# Patient Record
Sex: Female | Born: 1940 | Race: White | Hispanic: No | Marital: Married | Smoking: Never smoker
Health system: Southern US, Community
[De-identification: ages and names within clinical notes are randomized; demographics above are authoritative.]

## PROBLEM LIST (undated history)

## (undated) DIAGNOSIS — M545 Low back pain, unspecified: Secondary | ICD-10-CM

## (undated) DIAGNOSIS — L309 Dermatitis, unspecified: Secondary | ICD-10-CM

## (undated) DIAGNOSIS — Z808 Family history of malignant neoplasm of other organs or systems: Secondary | ICD-10-CM

## (undated) DIAGNOSIS — N200 Calculus of kidney: Secondary | ICD-10-CM

## (undated) DIAGNOSIS — Z8051 Family history of malignant neoplasm of kidney: Secondary | ICD-10-CM

## (undated) DIAGNOSIS — I10 Essential (primary) hypertension: Secondary | ICD-10-CM

## (undated) DIAGNOSIS — M353 Polymyalgia rheumatica: Secondary | ICD-10-CM

## (undated) DIAGNOSIS — T7840XA Allergy, unspecified, initial encounter: Secondary | ICD-10-CM

## (undated) DIAGNOSIS — I700 Atherosclerosis of aorta: Secondary | ICD-10-CM

## (undated) DIAGNOSIS — Z8719 Personal history of other diseases of the digestive system: Secondary | ICD-10-CM

## (undated) DIAGNOSIS — M1990 Unspecified osteoarthritis, unspecified site: Secondary | ICD-10-CM

## (undated) DIAGNOSIS — K146 Glossodynia: Secondary | ICD-10-CM

## (undated) DIAGNOSIS — K2270 Barrett's esophagus without dysplasia: Secondary | ICD-10-CM

## (undated) DIAGNOSIS — H8109 Meniere's disease, unspecified ear: Secondary | ICD-10-CM

## (undated) DIAGNOSIS — I729 Aneurysm of unspecified site: Secondary | ICD-10-CM

## (undated) DIAGNOSIS — M8580 Other specified disorders of bone density and structure, unspecified site: Secondary | ICD-10-CM

## (undated) DIAGNOSIS — Z807 Family history of other malignant neoplasms of lymphoid, hematopoietic and related tissues: Secondary | ICD-10-CM

## (undated) DIAGNOSIS — D126 Benign neoplasm of colon, unspecified: Secondary | ICD-10-CM

## (undated) DIAGNOSIS — I639 Cerebral infarction, unspecified: Secondary | ICD-10-CM

## (undated) DIAGNOSIS — R51 Headache: Secondary | ICD-10-CM

## (undated) DIAGNOSIS — H269 Unspecified cataract: Secondary | ICD-10-CM

## (undated) DIAGNOSIS — F419 Anxiety disorder, unspecified: Secondary | ICD-10-CM

## (undated) DIAGNOSIS — G709 Myoneural disorder, unspecified: Secondary | ICD-10-CM

## (undated) DIAGNOSIS — C50919 Malignant neoplasm of unspecified site of unspecified female breast: Secondary | ICD-10-CM

## (undated) DIAGNOSIS — G902 Horner's syndrome: Secondary | ICD-10-CM

## (undated) DIAGNOSIS — Z803 Family history of malignant neoplasm of breast: Secondary | ICD-10-CM

## (undated) DIAGNOSIS — K219 Gastro-esophageal reflux disease without esophagitis: Secondary | ICD-10-CM

## (undated) DIAGNOSIS — Z87442 Personal history of urinary calculi: Secondary | ICD-10-CM

## (undated) DIAGNOSIS — I776 Arteritis, unspecified: Secondary | ICD-10-CM

## (undated) DIAGNOSIS — I8310 Varicose veins of unspecified lower extremity with inflammation: Secondary | ICD-10-CM

## (undated) DIAGNOSIS — D649 Anemia, unspecified: Secondary | ICD-10-CM

## (undated) DIAGNOSIS — D689 Coagulation defect, unspecified: Secondary | ICD-10-CM

## (undated) DIAGNOSIS — E785 Hyperlipidemia, unspecified: Secondary | ICD-10-CM

## (undated) HISTORY — PX: UPPER GASTROINTESTINAL ENDOSCOPY: SHX188

## (undated) HISTORY — DX: Arteritis, unspecified: I77.6

## (undated) HISTORY — DX: Malignant neoplasm of unspecified site of unspecified female breast: C50.919

## (undated) HISTORY — DX: Anemia, unspecified: D64.9

## (undated) HISTORY — DX: Family history of malignant neoplasm of other organs or systems: Z80.8

## (undated) HISTORY — DX: Family history of other malignant neoplasms of lymphoid, hematopoietic and related tissues: Z80.7

## (undated) HISTORY — DX: Glossodynia: K14.6

## (undated) HISTORY — DX: Other specified disorders of bone density and structure, unspecified site: M85.80

## (undated) HISTORY — DX: Calculus of kidney: N20.0

## (undated) HISTORY — DX: Family history of malignant neoplasm of breast: Z80.3

## (undated) HISTORY — DX: Personal history of other diseases of the digestive system: Z87.19

## (undated) HISTORY — PX: POPLITEAL SYNOVIAL CYST EXCISION: SUR555

## (undated) HISTORY — DX: Atherosclerosis of aorta: I70.0

## (undated) HISTORY — DX: Allergy, unspecified, initial encounter: T78.40XA

## (undated) HISTORY — DX: Horner's syndrome: G90.2

## (undated) HISTORY — DX: Myoneural disorder, unspecified: G70.9

## (undated) HISTORY — DX: Dermatitis, unspecified: L30.9

## (undated) HISTORY — DX: Polymyalgia rheumatica: M35.3

## (undated) HISTORY — DX: Gastro-esophageal reflux disease without esophagitis: K21.9

## (undated) HISTORY — DX: Varicose veins of unspecified lower extremity with inflammation: I83.10

## (undated) HISTORY — DX: Barrett's esophagus without dysplasia: K22.70

## (undated) HISTORY — DX: Unspecified cataract: H26.9

## (undated) HISTORY — DX: Aneurysm of unspecified site: I72.9

## (undated) HISTORY — DX: Low back pain: M54.5

## (undated) HISTORY — DX: Unspecified osteoarthritis, unspecified site: M19.90

## (undated) HISTORY — DX: Benign neoplasm of colon, unspecified: D12.6

## (undated) HISTORY — PX: POLYPECTOMY: SHX149

## (undated) HISTORY — DX: Low back pain, unspecified: M54.50

## (undated) HISTORY — DX: Headache: R51

## (undated) HISTORY — DX: Family history of malignant neoplasm of kidney: Z80.51

## (undated) HISTORY — DX: Meniere's disease, unspecified ear: H81.09

## (undated) HISTORY — DX: Hyperlipidemia, unspecified: E78.5

## (undated) HISTORY — DX: Coagulation defect, unspecified: D68.9

## (undated) HISTORY — PX: EYE SURGERY: SHX253

## (undated) HISTORY — DX: Cerebral infarction, unspecified: I63.9

## (undated) HISTORY — PX: CATARACT EXTRACTION, BILATERAL: SHX1313

---
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
## 1986-03-22 DIAGNOSIS — D689 Coagulation defect, unspecified: Secondary | ICD-10-CM

## 1986-03-22 DIAGNOSIS — I639 Cerebral infarction, unspecified: Secondary | ICD-10-CM

## 1986-03-22 HISTORY — DX: Cerebral infarction, unspecified: I63.9

## 1986-03-22 HISTORY — DX: Coagulation defect, unspecified: D68.9

## 1986-03-22 HISTORY — PX: OTHER SURGICAL HISTORY: SHX169

## 1996-03-22 HISTORY — PX: BUNIONECTOMY: SHX129

## 1999-01-12 ENCOUNTER — Other Ambulatory Visit: Admission: RE | Admit: 1999-01-12 | Discharge: 1999-01-12 | Payer: Self-pay | Admitting: Obstetrics and Gynecology

## 1999-01-12 ENCOUNTER — Encounter (INDEPENDENT_AMBULATORY_CARE_PROVIDER_SITE_OTHER): Payer: Self-pay

## 2000-02-17 ENCOUNTER — Encounter: Payer: Self-pay | Admitting: Internal Medicine

## 2000-02-17 ENCOUNTER — Ambulatory Visit (HOSPITAL_COMMUNITY): Admission: RE | Admit: 2000-02-17 | Discharge: 2000-02-17 | Payer: Self-pay | Admitting: Internal Medicine

## 2000-10-04 ENCOUNTER — Other Ambulatory Visit: Admission: RE | Admit: 2000-10-04 | Discharge: 2000-10-04 | Payer: Self-pay | Admitting: Internal Medicine

## 2001-01-18 ENCOUNTER — Encounter: Payer: Self-pay | Admitting: Neurological Surgery

## 2001-01-18 ENCOUNTER — Ambulatory Visit (HOSPITAL_COMMUNITY): Admission: RE | Admit: 2001-01-18 | Discharge: 2001-01-18 | Payer: Self-pay | Admitting: Neurological Surgery

## 2001-02-07 ENCOUNTER — Ambulatory Visit (HOSPITAL_COMMUNITY): Admission: RE | Admit: 2001-02-07 | Discharge: 2001-02-07 | Payer: Self-pay | Admitting: Neurology

## 2001-02-07 ENCOUNTER — Encounter: Payer: Self-pay | Admitting: Neurology

## 2001-03-21 ENCOUNTER — Emergency Department (HOSPITAL_COMMUNITY): Admission: EM | Admit: 2001-03-21 | Discharge: 2001-03-21 | Payer: Self-pay | Admitting: Emergency Medicine

## 2001-03-22 ENCOUNTER — Emergency Department (HOSPITAL_COMMUNITY): Admission: EM | Admit: 2001-03-22 | Discharge: 2001-03-22 | Payer: Self-pay | Admitting: Emergency Medicine

## 2001-03-22 ENCOUNTER — Encounter: Payer: Self-pay | Admitting: Emergency Medicine

## 2001-10-09 ENCOUNTER — Other Ambulatory Visit: Admission: RE | Admit: 2001-10-09 | Discharge: 2001-10-09 | Payer: Self-pay | Admitting: Obstetrics and Gynecology

## 2002-03-22 HISTORY — PX: DILATION AND CURETTAGE OF UTERUS: SHX78

## 2002-07-20 ENCOUNTER — Encounter (INDEPENDENT_AMBULATORY_CARE_PROVIDER_SITE_OTHER): Payer: Self-pay | Admitting: *Deleted

## 2002-07-20 ENCOUNTER — Ambulatory Visit (HOSPITAL_COMMUNITY): Admission: RE | Admit: 2002-07-20 | Discharge: 2002-07-20 | Payer: Self-pay | Admitting: Obstetrics and Gynecology

## 2002-10-18 ENCOUNTER — Other Ambulatory Visit: Admission: RE | Admit: 2002-10-18 | Discharge: 2002-10-18 | Payer: Self-pay | Admitting: Obstetrics and Gynecology

## 2003-11-18 ENCOUNTER — Other Ambulatory Visit: Admission: RE | Admit: 2003-11-18 | Discharge: 2003-11-18 | Payer: Self-pay | Admitting: Obstetrics and Gynecology

## 2004-04-15 ENCOUNTER — Ambulatory Visit: Payer: Self-pay | Admitting: Internal Medicine

## 2004-04-22 ENCOUNTER — Ambulatory Visit: Payer: Self-pay | Admitting: Internal Medicine

## 2004-06-09 ENCOUNTER — Ambulatory Visit: Payer: Self-pay | Admitting: Internal Medicine

## 2004-07-09 ENCOUNTER — Ambulatory Visit: Payer: Self-pay | Admitting: Internal Medicine

## 2004-09-11 ENCOUNTER — Ambulatory Visit: Payer: Self-pay | Admitting: Internal Medicine

## 2004-12-07 ENCOUNTER — Ambulatory Visit: Payer: Self-pay | Admitting: Internal Medicine

## 2004-12-14 ENCOUNTER — Ambulatory Visit: Payer: Self-pay | Admitting: Internal Medicine

## 2005-01-14 ENCOUNTER — Ambulatory Visit: Payer: Self-pay | Admitting: Internal Medicine

## 2005-01-18 ENCOUNTER — Other Ambulatory Visit: Admission: RE | Admit: 2005-01-18 | Discharge: 2005-01-18 | Payer: Self-pay | Admitting: Obstetrics and Gynecology

## 2005-04-15 ENCOUNTER — Ambulatory Visit: Payer: Self-pay | Admitting: Internal Medicine

## 2005-07-07 ENCOUNTER — Ambulatory Visit: Payer: Self-pay | Admitting: Internal Medicine

## 2005-08-26 ENCOUNTER — Ambulatory Visit: Payer: Self-pay | Admitting: Internal Medicine

## 2005-10-08 ENCOUNTER — Ambulatory Visit: Payer: Self-pay | Admitting: Internal Medicine

## 2005-10-22 ENCOUNTER — Ambulatory Visit: Payer: Self-pay | Admitting: Internal Medicine

## 2005-11-26 ENCOUNTER — Ambulatory Visit: Payer: Self-pay | Admitting: Internal Medicine

## 2005-12-21 ENCOUNTER — Ambulatory Visit: Payer: Self-pay | Admitting: Internal Medicine

## 2005-12-28 ENCOUNTER — Ambulatory Visit: Payer: Self-pay | Admitting: Internal Medicine

## 2006-04-18 ENCOUNTER — Ambulatory Visit: Payer: Self-pay | Admitting: Internal Medicine

## 2006-05-25 ENCOUNTER — Ambulatory Visit: Payer: Self-pay | Admitting: Internal Medicine

## 2006-07-26 ENCOUNTER — Ambulatory Visit: Payer: Self-pay | Admitting: Internal Medicine

## 2006-08-26 ENCOUNTER — Ambulatory Visit: Payer: Self-pay | Admitting: Internal Medicine

## 2006-08-26 LAB — CONVERTED CEMR LAB
Calcium: 9.9 mg/dL (ref 8.4–10.5)
Chloride: 107 meq/L (ref 96–112)
Creatinine, Ser: 0.7 mg/dL (ref 0.4–1.2)
GFR calc Af Amer: 108 mL/min
GFR calc non Af Amer: 90 mL/min
Glucose, Bld: 93 mg/dL (ref 70–99)
Magnesium: 2.2 mg/dL (ref 1.5–2.5)
Potassium: 5.9 meq/L — ABNORMAL HIGH (ref 3.5–5.1)

## 2006-08-29 ENCOUNTER — Ambulatory Visit: Payer: Self-pay | Admitting: Internal Medicine

## 2006-08-29 LAB — CONVERTED CEMR LAB: Potassium: 4.5 meq/L (ref 3.5–5.1)

## 2006-09-01 ENCOUNTER — Ambulatory Visit: Payer: Self-pay | Admitting: Internal Medicine

## 2006-09-19 ENCOUNTER — Telehealth: Payer: Self-pay | Admitting: Internal Medicine

## 2006-09-20 ENCOUNTER — Telehealth: Payer: Self-pay | Admitting: *Deleted

## 2006-09-20 ENCOUNTER — Ambulatory Visit: Payer: Self-pay | Admitting: Internal Medicine

## 2006-09-22 DIAGNOSIS — H8109 Meniere's disease, unspecified ear: Secondary | ICD-10-CM | POA: Insufficient documentation

## 2006-09-22 DIAGNOSIS — I7300 Raynaud's syndrome without gangrene: Secondary | ICD-10-CM

## 2006-09-22 DIAGNOSIS — M810 Age-related osteoporosis without current pathological fracture: Secondary | ICD-10-CM | POA: Insufficient documentation

## 2006-10-07 ENCOUNTER — Ambulatory Visit: Payer: Self-pay | Admitting: Internal Medicine

## 2006-10-07 LAB — CONVERTED CEMR LAB: CRP, High Sensitivity: 4 (ref 0.00–5.00)

## 2006-10-14 ENCOUNTER — Ambulatory Visit: Payer: Self-pay | Admitting: Internal Medicine

## 2006-10-14 LAB — CONVERTED CEMR LAB
Cortisol, Plasma: 8 ug/dL
Prolactin: 6.4 ng/mL
TSH: 0.86 microintl units/mL (ref 0.35–5.50)

## 2006-10-31 ENCOUNTER — Telehealth: Payer: Self-pay | Admitting: Internal Medicine

## 2006-11-08 ENCOUNTER — Ambulatory Visit: Payer: Self-pay | Admitting: Internal Medicine

## 2006-12-09 ENCOUNTER — Ambulatory Visit: Payer: Self-pay | Admitting: Internal Medicine

## 2006-12-09 LAB — CONVERTED CEMR LAB: Sed Rate: 41 mm/hr — ABNORMAL HIGH (ref 0–25)

## 2006-12-16 ENCOUNTER — Ambulatory Visit: Payer: Self-pay | Admitting: Internal Medicine

## 2006-12-16 LAB — CONVERTED CEMR LAB
Albumin ELP: 55.4 % — ABNORMAL LOW (ref 55.8–66.1)
IgA: 446 mg/dL — ABNORMAL HIGH (ref 68–378)
Total Protein, Serum Electrophoresis: 7.6 g/dL (ref 6.0–8.3)

## 2006-12-20 ENCOUNTER — Encounter: Payer: Self-pay | Admitting: Internal Medicine

## 2006-12-20 LAB — CONVERTED CEMR LAB
Alpha 1, Urine: DETECTED % — AB
Free Kappa Lt Chains,Ur: <0.05 mg/dL (ref 0.04–1.51)
Free Lambda Lt Chains,Ur: <0.08 mg/dL (ref 0.08–1.01)
Total Protein, Urine-Ur/day: 17 mg/24hr (ref 10–140)

## 2007-01-03 ENCOUNTER — Ambulatory Visit: Payer: Self-pay | Admitting: Internal Medicine

## 2007-01-30 ENCOUNTER — Encounter: Payer: Self-pay | Admitting: Internal Medicine

## 2007-02-17 ENCOUNTER — Ambulatory Visit: Payer: Self-pay | Admitting: Internal Medicine

## 2007-02-17 LAB — CONVERTED CEMR LAB
ALT: 25 units/L (ref 0–35)
AST: 28 units/L (ref 0–37)
Albumin: 4 g/dL (ref 3.5–5.2)
Alkaline Phosphatase: 45 units/L (ref 39–117)
Bilirubin Urine: NEGATIVE
Bilirubin, Direct: 0.1 mg/dL (ref 0.0–0.3)
CO2: 28 meq/L (ref 19–32)
Cholesterol: 173 mg/dL (ref 0–200)
Eosinophils Absolute: 0.2 10*3/uL (ref 0.0–0.6)
HCT: 36.2 % (ref 36.0–46.0)
HDL: 56.7 mg/dL (ref >39.0)
Hemoglobin: 12 g/dL (ref 12.0–15.0)
Ketones, urine, test strip: NEGATIVE
Nitrite: NEGATIVE
Platelets: 353 10*3/uL (ref 150–400)
Potassium: 4.6 meq/L (ref 3.5–5.1)
Protein, U semiquant: NEGATIVE
Sodium: 142 meq/L (ref 135–145)
Specific Gravity, Urine: 1.02
Total Bilirubin: 0.4 mg/dL (ref 0.3–1.2)
Urobilinogen, UA: 0.2
VLDL: 6 mg/dL (ref 0–40)

## 2007-02-21 ENCOUNTER — Telehealth (INDEPENDENT_AMBULATORY_CARE_PROVIDER_SITE_OTHER): Payer: Self-pay | Admitting: *Deleted

## 2007-03-09 ENCOUNTER — Ambulatory Visit: Payer: Self-pay | Admitting: Internal Medicine

## 2007-03-09 DIAGNOSIS — I8310 Varicose veins of unspecified lower extremity with inflammation: Secondary | ICD-10-CM | POA: Insufficient documentation

## 2007-03-09 DIAGNOSIS — L570 Actinic keratosis: Secondary | ICD-10-CM | POA: Insufficient documentation

## 2007-03-09 DIAGNOSIS — J309 Allergic rhinitis, unspecified: Secondary | ICD-10-CM | POA: Insufficient documentation

## 2007-03-09 DIAGNOSIS — R51 Headache: Secondary | ICD-10-CM

## 2007-03-09 DIAGNOSIS — M545 Low back pain: Secondary | ICD-10-CM

## 2007-03-09 DIAGNOSIS — R519 Headache, unspecified: Secondary | ICD-10-CM | POA: Insufficient documentation

## 2007-03-09 LAB — CONVERTED CEMR LAB: CRP, High Sensitivity: 7 — ABNORMAL HIGH (ref 0.00–5.00)

## 2007-03-23 HISTORY — PX: OTHER SURGICAL HISTORY: SHX169

## 2007-03-30 ENCOUNTER — Encounter: Payer: Self-pay | Admitting: Internal Medicine

## 2007-03-30 ENCOUNTER — Encounter: Admission: RE | Admit: 2007-03-30 | Discharge: 2007-03-30 | Payer: Self-pay | Admitting: Internal Medicine

## 2007-03-30 LAB — CONVERTED CEMR LAB: Pap Smear: NORMAL

## 2007-06-01 ENCOUNTER — Ambulatory Visit: Payer: Self-pay | Admitting: Internal Medicine

## 2007-06-01 DIAGNOSIS — I776 Arteritis, unspecified: Secondary | ICD-10-CM

## 2007-06-16 ENCOUNTER — Telehealth: Payer: Self-pay | Admitting: Internal Medicine

## 2007-06-19 ENCOUNTER — Telehealth: Payer: Self-pay | Admitting: Internal Medicine

## 2007-06-20 ENCOUNTER — Ambulatory Visit: Payer: Self-pay | Admitting: Internal Medicine

## 2007-08-02 ENCOUNTER — Encounter: Admission: RE | Admit: 2007-08-02 | Discharge: 2007-08-02 | Payer: Self-pay | Admitting: Interventional Radiology

## 2007-08-29 ENCOUNTER — Ambulatory Visit: Payer: Self-pay | Admitting: Internal Medicine

## 2007-08-29 ENCOUNTER — Telehealth: Payer: Self-pay | Admitting: Internal Medicine

## 2007-08-29 LAB — CONVERTED CEMR LAB
Ketones, urine, test strip: NEGATIVE
Protein, U semiquant: NEGATIVE
Specific Gravity, Urine: 1.01
Urobilinogen, UA: 0.2
pH: 6.5

## 2007-09-04 ENCOUNTER — Ambulatory Visit: Payer: Self-pay | Admitting: Internal Medicine

## 2007-09-07 ENCOUNTER — Telehealth: Payer: Self-pay | Admitting: Internal Medicine

## 2007-09-07 ENCOUNTER — Ambulatory Visit: Payer: Self-pay | Admitting: Internal Medicine

## 2007-09-08 ENCOUNTER — Encounter: Payer: Self-pay | Admitting: Internal Medicine

## 2007-09-12 ENCOUNTER — Telehealth: Payer: Self-pay | Admitting: Internal Medicine

## 2007-11-13 ENCOUNTER — Ambulatory Visit: Payer: Self-pay | Admitting: Internal Medicine

## 2007-11-14 ENCOUNTER — Encounter: Payer: Self-pay | Admitting: Internal Medicine

## 2007-11-22 DIAGNOSIS — F430 Acute stress reaction: Secondary | ICD-10-CM | POA: Insufficient documentation

## 2007-12-05 ENCOUNTER — Encounter: Admission: RE | Admit: 2007-12-05 | Discharge: 2007-12-05 | Payer: Self-pay | Admitting: Interventional Radiology

## 2007-12-07 ENCOUNTER — Ambulatory Visit: Payer: Self-pay | Admitting: Internal Medicine

## 2007-12-12 ENCOUNTER — Encounter: Admission: RE | Admit: 2007-12-12 | Discharge: 2007-12-12 | Payer: Self-pay | Admitting: Interventional Radiology

## 2007-12-27 ENCOUNTER — Encounter: Admission: RE | Admit: 2007-12-27 | Discharge: 2007-12-27 | Payer: Self-pay | Admitting: Interventional Radiology

## 2008-01-02 ENCOUNTER — Encounter: Payer: Self-pay | Admitting: Internal Medicine

## 2008-01-31 ENCOUNTER — Encounter: Admission: RE | Admit: 2008-01-31 | Discharge: 2008-01-31 | Payer: Self-pay | Admitting: Orthopedic Surgery

## 2008-02-01 ENCOUNTER — Encounter: Payer: Self-pay | Admitting: Internal Medicine

## 2008-02-14 ENCOUNTER — Encounter: Admission: RE | Admit: 2008-02-14 | Discharge: 2008-02-14 | Payer: Self-pay | Admitting: Interventional Radiology

## 2008-02-28 ENCOUNTER — Encounter: Admission: RE | Admit: 2008-02-28 | Discharge: 2008-02-28 | Payer: Self-pay | Admitting: Interventional Radiology

## 2008-03-05 ENCOUNTER — Encounter: Admission: RE | Admit: 2008-03-05 | Discharge: 2008-03-05 | Payer: Self-pay | Admitting: Interventional Radiology

## 2008-03-11 ENCOUNTER — Ambulatory Visit: Payer: Self-pay | Admitting: Internal Medicine

## 2008-03-11 LAB — CONVERTED CEMR LAB
AST: 27 units/L (ref 0–37)
Albumin: 3.8 g/dL (ref 3.5–5.2)
Basophils Absolute: 0.1 10*3/uL (ref 0.0–0.1)
Bilirubin Urine: NEGATIVE
Blood in Urine, dipstick: NEGATIVE
CO2: 28 meq/L (ref 19–32)
Chloride: 107 meq/L (ref 96–112)
Creatinine, Ser: 0.5 mg/dL (ref 0.4–1.2)
Eosinophils Absolute: 0.3 10*3/uL (ref 0.0–0.7)
Eosinophils Relative: 4.5 % (ref 0.0–5.0)
GFR calc Af Amer: 159 mL/min
GFR calc non Af Amer: 131 mL/min
Glucose, Urine, Semiquant: NEGATIVE
HDL: 59.6 mg/dL (ref >39.0)
Ketones, urine, test strip: NEGATIVE
MCV: 89.1 fL (ref 78.0–100.0)
Monocytes Relative: 9.7 % (ref 3.0–12.0)
Neutrophils Relative %: 42.9 % — ABNORMAL LOW (ref 43.0–77.0)
Nitrite: NEGATIVE
Platelets: 345 10*3/uL (ref 150–400)
Potassium: 4 meq/L (ref 3.5–5.1)
RBC: 4.08 M/uL (ref 3.87–5.11)
RDW: 12.9 % (ref 11.5–14.6)
Specific Gravity, Urine: 1.015
Total Bilirubin: 0.5 mg/dL (ref 0.3–1.2)
Total Protein: 7.5 g/dL (ref 6.0–8.3)
pH: 7

## 2008-03-14 ENCOUNTER — Ambulatory Visit: Payer: Self-pay | Admitting: Internal Medicine

## 2008-03-14 DIAGNOSIS — M1710 Unilateral primary osteoarthritis, unspecified knee: Secondary | ICD-10-CM

## 2008-04-10 ENCOUNTER — Encounter: Admission: RE | Admit: 2008-04-10 | Discharge: 2008-04-10 | Payer: Self-pay | Admitting: Interventional Radiology

## 2008-05-14 ENCOUNTER — Encounter: Admission: RE | Admit: 2008-05-14 | Discharge: 2008-05-14 | Payer: Self-pay | Admitting: Interventional Radiology

## 2008-05-14 ENCOUNTER — Encounter: Payer: Self-pay | Admitting: Internal Medicine

## 2008-06-14 ENCOUNTER — Ambulatory Visit: Payer: Self-pay | Admitting: Internal Medicine

## 2008-06-17 DIAGNOSIS — M353 Polymyalgia rheumatica: Secondary | ICD-10-CM | POA: Insufficient documentation

## 2008-06-24 ENCOUNTER — Telehealth: Payer: Self-pay | Admitting: Internal Medicine

## 2008-06-24 ENCOUNTER — Ambulatory Visit: Payer: Self-pay | Admitting: Internal Medicine

## 2008-07-23 ENCOUNTER — Encounter: Payer: Self-pay | Admitting: Internal Medicine

## 2008-08-01 ENCOUNTER — Ambulatory Visit: Payer: Self-pay | Admitting: Internal Medicine

## 2008-08-01 DIAGNOSIS — N390 Urinary tract infection, site not specified: Secondary | ICD-10-CM | POA: Insufficient documentation

## 2008-08-01 LAB — CONVERTED CEMR LAB
Bilirubin Urine: NEGATIVE
Urobilinogen, UA: 0.2

## 2008-08-02 ENCOUNTER — Encounter: Payer: Self-pay | Admitting: Internal Medicine

## 2008-08-08 ENCOUNTER — Telehealth: Payer: Self-pay | Admitting: Internal Medicine

## 2008-08-20 ENCOUNTER — Telehealth: Payer: Self-pay | Admitting: Internal Medicine

## 2008-08-21 ENCOUNTER — Encounter: Payer: Self-pay | Admitting: Internal Medicine

## 2008-08-22 ENCOUNTER — Encounter: Payer: Self-pay | Admitting: Internal Medicine

## 2008-09-13 ENCOUNTER — Ambulatory Visit: Payer: Self-pay | Admitting: Internal Medicine

## 2008-09-13 LAB — CONVERTED CEMR LAB: Sed Rate: 40 mm/hr — ABNORMAL HIGH (ref 0–22)

## 2008-09-17 ENCOUNTER — Telehealth: Payer: Self-pay | Admitting: Internal Medicine

## 2008-10-16 ENCOUNTER — Encounter: Payer: Self-pay | Admitting: Internal Medicine

## 2008-10-23 ENCOUNTER — Encounter: Payer: Self-pay | Admitting: Internal Medicine

## 2008-10-30 ENCOUNTER — Encounter: Payer: Self-pay | Admitting: Internal Medicine

## 2008-12-10 ENCOUNTER — Ambulatory Visit: Payer: Self-pay | Admitting: Internal Medicine

## 2008-12-10 DIAGNOSIS — N200 Calculus of kidney: Secondary | ICD-10-CM

## 2008-12-10 HISTORY — DX: Calculus of kidney: N20.0

## 2008-12-10 LAB — CONVERTED CEMR LAB
CO2: 25 meq/L (ref 19–32)
GFR calc non Af Amer: 88.78 mL/min (ref >60)
Glucose, Bld: 84 mg/dL (ref 70–99)
Potassium: 4.2 meq/L (ref 3.5–5.1)
Sodium: 142 meq/L (ref 135–145)

## 2008-12-11 ENCOUNTER — Encounter: Payer: Self-pay | Admitting: Internal Medicine

## 2008-12-17 ENCOUNTER — Ambulatory Visit: Payer: Self-pay | Admitting: Internal Medicine

## 2009-02-04 ENCOUNTER — Encounter (INDEPENDENT_AMBULATORY_CARE_PROVIDER_SITE_OTHER): Payer: Self-pay | Admitting: *Deleted

## 2009-02-10 ENCOUNTER — Encounter (INDEPENDENT_AMBULATORY_CARE_PROVIDER_SITE_OTHER): Payer: Self-pay | Admitting: *Deleted

## 2009-03-11 ENCOUNTER — Ambulatory Visit: Payer: Self-pay | Admitting: Internal Medicine

## 2009-03-11 LAB — CONVERTED CEMR LAB
AST: 22 units/L (ref 0–37)
Albumin: 3.9 g/dL (ref 3.5–5.2)
Alkaline Phosphatase: 44 units/L (ref 39–117)
Basophils Absolute: 0 10*3/uL (ref 0.0–0.1)
Basophils Relative: 0.7 % (ref 0.0–3.0)
Bilirubin, Direct: 0.1 mg/dL (ref 0.0–0.3)
Blood in Urine, dipstick: NEGATIVE
Calcium: 9.5 mg/dL (ref 8.4–10.5)
GFR calc non Af Amer: 105.98 mL/min (ref >60)
HDL: 70.2 mg/dL (ref >39.00)
Hemoglobin: 12.4 g/dL (ref 12.0–15.0)
LDL Cholesterol: 102 mg/dL — ABNORMAL HIGH (ref 0–99)
Lymphocytes Relative: 40.7 % (ref 12.0–46.0)
Monocytes Relative: 11.8 % (ref 3.0–12.0)
Neutro Abs: 3 10*3/uL (ref 1.4–7.7)
Nitrite: NEGATIVE
Protein, U semiquant: NEGATIVE
RBC: 4.16 M/uL (ref 3.87–5.11)
RDW: 13.5 % (ref 11.5–14.6)
Sodium: 143 meq/L (ref 135–145)
Total CHOL/HDL Ratio: 3
VLDL: 15.2 mg/dL (ref 0.0–40.0)
WBC Urine, dipstick: NEGATIVE

## 2009-03-18 ENCOUNTER — Ambulatory Visit: Payer: Self-pay | Admitting: Internal Medicine

## 2009-03-18 LAB — HM MAMMOGRAPHY

## 2009-04-10 ENCOUNTER — Encounter: Payer: Self-pay | Admitting: Internal Medicine

## 2009-04-24 LAB — CONVERTED CEMR LAB

## 2009-05-13 ENCOUNTER — Ambulatory Visit: Payer: Self-pay | Admitting: Internal Medicine

## 2009-06-09 ENCOUNTER — Encounter: Payer: Self-pay | Admitting: Internal Medicine

## 2009-07-29 ENCOUNTER — Ambulatory Visit: Payer: Self-pay | Admitting: Internal Medicine

## 2009-07-29 LAB — CONVERTED CEMR LAB
Albumin: 4.1 g/dL (ref 3.5–5.2)
Alkaline Phosphatase: 44 units/L (ref 39–117)
CO2: 28 meq/L (ref 19–32)
Chloride: 107 meq/L (ref 96–112)
Creatinine, Ser: 0.6 mg/dL (ref 0.4–1.2)
HDL: 68.5 mg/dL (ref >39.00)
LDL Cholesterol: 106 mg/dL — ABNORMAL HIGH (ref 0–99)
Potassium: 4.2 meq/L (ref 3.5–5.1)
Total CHOL/HDL Ratio: 3
Total Protein: 7.2 g/dL (ref 6.0–8.3)
Triglycerides: 42 mg/dL (ref 0.0–149.0)
VLDL: 8.4 mg/dL (ref 0.0–40.0)

## 2009-08-05 ENCOUNTER — Ambulatory Visit: Payer: Self-pay | Admitting: Internal Medicine

## 2009-08-05 LAB — CONVERTED CEMR LAB
Basophils Relative: 0.5 % (ref 0.0–3.0)
Eosinophils Relative: 3.7 % (ref 0.0–5.0)
Hemoglobin: 11.9 g/dL — ABNORMAL LOW (ref 12.0–15.0)
Lymphocytes Relative: 30.6 % (ref 12.0–46.0)
Monocytes Relative: 11.4 % (ref 3.0–12.0)
Neutro Abs: 4.2 10*3/uL (ref 1.4–7.7)
Neutrophils Relative %: 53.8 % (ref 43.0–77.0)
RBC: 4.06 M/uL (ref 3.87–5.11)
Sed Rate: 34 mm/hr — ABNORMAL HIGH (ref 0–22)
WBC: 7.8 10*3/uL (ref 4.5–10.5)

## 2009-08-06 ENCOUNTER — Telehealth: Payer: Self-pay | Admitting: Internal Medicine

## 2009-08-08 ENCOUNTER — Encounter: Payer: Self-pay | Admitting: Internal Medicine

## 2009-10-31 ENCOUNTER — Encounter (INDEPENDENT_AMBULATORY_CARE_PROVIDER_SITE_OTHER): Payer: Self-pay | Admitting: *Deleted

## 2009-11-04 ENCOUNTER — Ambulatory Visit: Payer: Self-pay | Admitting: Internal Medicine

## 2009-11-04 DIAGNOSIS — N959 Unspecified menopausal and perimenopausal disorder: Secondary | ICD-10-CM | POA: Insufficient documentation

## 2009-11-04 LAB — CONVERTED CEMR LAB: CRP, High Sensitivity: 5.93 — ABNORMAL HIGH (ref 0.00–5.00)

## 2009-11-12 ENCOUNTER — Encounter: Payer: Self-pay | Admitting: Internal Medicine

## 2010-01-28 ENCOUNTER — Telehealth: Payer: Self-pay | Admitting: Internal Medicine

## 2010-02-05 ENCOUNTER — Encounter: Payer: Self-pay | Admitting: Internal Medicine

## 2010-02-06 ENCOUNTER — Ambulatory Visit: Payer: Self-pay | Admitting: Internal Medicine

## 2010-02-06 DIAGNOSIS — K449 Diaphragmatic hernia without obstruction or gangrene: Secondary | ICD-10-CM | POA: Insufficient documentation

## 2010-02-16 ENCOUNTER — Telehealth: Payer: Self-pay | Admitting: Internal Medicine

## 2010-03-11 ENCOUNTER — Encounter: Payer: Self-pay | Admitting: Internal Medicine

## 2010-03-12 ENCOUNTER — Encounter: Payer: Self-pay | Admitting: Internal Medicine

## 2010-04-12 ENCOUNTER — Encounter: Payer: Self-pay | Admitting: Interventional Radiology

## 2010-04-21 NOTE — Letter (Signed)
Summary: Colonoscopy Date Change Letter  [facility]  [address] [place], [place] [address]   Phone: [phone]  Fax: [phone]      October 31, 2009 MRN: [id number]   [facility] [address] [place], [place]  [address]   Dear Ms. Kanode,   Previously you were recommended to have a repeat colonoscopy around this time. Your chart was recently reviewed by Dr. Judie Petit T. Russella Dar of [facility]. Follow up colonoscopy is now recommended in September 2014. This revised recommendation is based on current, nationally recognized guidelines for colorectal cancer screening and polyp surveillance. These guidelines are endorsed by the American Cancer Society, [employer] on Colorectal Cancer as well as numerous other major medical organizations.  Please understand that our recommendation assumes that you do not have any new symptoms such as bleeding, a change in bowel habits, anemia, or significant abdominal discomfort. If you do have any concerning GI symptoms or want to discuss the guideline recommendations, please call to arrange an office visit at your earliest convenience. Otherwise we will keep you in our reminder system and contact you 1-2 months prior to the date listed above to schedule your next colonoscopy.  Thank you,  Judie Petit T. Russella Dar, M.D.  [facility] [phone]

## 2010-04-21 NOTE — Letter (Signed)
Summary: [facility]   Imported By: Maryln Gottron 04/23/2009 15:51:16  _____________________________________________________________________  External Attachment:    Type:   Image     Comment:   External Document

## 2010-04-21 NOTE — Assessment & Plan Note (Signed)
Summary: ROA/FUP/RCD   Vital Signs:  Patient profile:   70 year old female Height:      67 inches Weight:      136 pounds BMI:     21.38 Temp:     98.2 degrees F oral Pulse rate:   72 / minute Resp:     14 per minute BP sitting:   144 / 80  (left arm)  Vitals Entered By: Willy Eddy, LPN (February 06, 2010 9:06 AM) CC: roa Is Patient Diabetic? No   Primary Care Provider:  Stacie Glaze MD  CC:  roa.  History of Present Illness: still has acid feeling in stomach and had used baking soda. She increased the prilosec to two times a day and it has helped. She feels that sometimes food will "grab her" she sleeps with the head of the bed elevated. 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) 6)  Stress Reaction, Acute, With Psychomotor Disturbance  (ICD-308.2) 7)  Unspecified Arteritis  (ICD-447.6) 8)  Actinic Keratosis, Forehead, Left  (ICD-702.0) 9)  Preventive Health Care  (ICD-V70.0) 10)  Headache  (ICD-784.0) 11)  Low Back Pain  (ICD-724.2) 12)  Allergic Rhinitis  (ICD-477.9) 13)  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) 14)  Osteopenia  (ICD-733.90) 15)  Meniere's Disease  (ICD-386.00) 16)  Raynaud's Disease  (ICD-443.0)  Current Problems (verified): 1)  Unspecified Menopausal&postmenopausal Disorder  (ICD-627.9) 2)  Nephrolithiasis  (ICD-592.0) 3)  Urinary Tract Infection, Chronic  (ICD-599.0) 4)  Polymyalgia Rheumatica  (ICD-725) 5)  Degenerative Joint Disease, 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 1 Qd 2)  Adult Aspirin Ec Low Strength 81 Mg  Tbec (Aspirin) .... Once Daily 3)  Frova 2.5 Mg  Tabs (Frovatriptan Succinate) .... As Needed 4)  Lopressor 50 Mg  Tabs (Metoprolol Tartrate) .... 1/2 Twice A Day 5)  Librax 2.5-5 Mg  Caps (Clidinium-Chlordiazepoxide) .... As Needed 6)  Meclizine Hcl 25 Mg  Tabs (Meclizine Hcl) .... Prn 7)  Betamethasone Dipropionate 0.05 %  Crea (Betamethasone Dipropionate) .... Apply To Skin As Directed 8)  Fluticasone Propionate 50 Mcg/act Susp (Fluticasone Propionate) .... Two Spray Q Nare Daily 9)  Prednisone 1 Mg Tabs (Prednisone) .... 4 Once Daily 10)  Omega 3 1000 .... Once Daily 11)  Etodolac 300 Mg Caps (Etodolac) .... One By Mouth Two Times A Day As Needed 12)  Omeprazole 40 Mg Cpdr (Omeprazole) .... One By Mouth Two Times A Day 13)  Bayer Citracal Calcium With D Slow Release 1200mg  .... 1 Once Daily  Current Medications (verified): 1)  Fenofibrate 160 Mg Tabs (  Fenofibrate) .Marland Kitchen.. 1 Qd 2)  Adult Aspirin Ec Low Strength 81 Mg  Tbec (Aspirin) .... Once Daily 3)  Frova 2.5 Mg  Tabs (Frovatriptan Succinate) .... As Needed 4)  Lopressor 50 Mg  Tabs (Metoprolol Tartrate) .... 1/2 Twice A Day 5)  Librax 2.5-5 Mg  Caps (Clidinium-Chlordiazepoxide) .... As Needed 6)  Meclizine Hcl 25 Mg  Tabs (Meclizine Hcl) .... Prn 7)  Betamethasone Dipropionate 0.05 %  Crea (Betamethasone Dipropionate) .... Apply To Skin As Directed 8)  Fluticasone Propionate 50 Mcg/act Susp  (Fluticasone Propionate) .... Two Spray Q Nare Daily 9)  Prednisone 1 Mg Tabs (Prednisone) .... 4 Once Daily 10)  Omeprazole 40 Mg Cpdr (Omeprazole) .... One By Mouth Two Times A Day As Needed 11)  Bayer Citracal Calcium With D Slow Release 1200mg  .... 1 Once Daily 12)  Sea-Omega 30 1200 Mg Caps (Omega-3 Fatty Acids) .Marland Kitchen.. 1 Once Daily  Allergies (verified): 1)  ! Pcn 2)  ! Vicodin 3)  ! Neomycin 4)  ! Pneumovax 23 (Pneumococcal Vac Polyvalent)  Past History:  Family History: Last updated: October 17, 2006 mother died in MVA Father died of multiple myeloma at 57  Social History: Last updated: 11/08/2006 Married Alcohol use-no high stress job  Risk Factors: Smoking Status: never (02/06/2010) Passive Smoke Exposure: no (02/06/2010)  Past medical, surgical, family and social histories (including risk factors) reviewed, and no changes noted (except as noted below).  Past Medical History: Reviewed history from 03/09/2007 and no changes required. Osteopenia vasculitis PMR varicose veins with inflamation Allergic rhinitis Low back pain Headache  Past Surgical History: Reviewed history from 03/14/2008 and no changes required. Colonoscopy-11/23/2002 D&C-2004 Caesarean section Tonsillectomy bakers cyst left knee arthroscopy L knee   Family History: Reviewed history from 10/17/06 and no changes required. mother died in MVA Father died of multiple myeloma at 47  Social History: Reviewed history from 11/08/2006 and no changes required. Married Alcohol use-no high stress job  Review of Systems  The patient denies anorexia, fever, weight loss, weight gain, vision loss, decreased hearing, hoarseness, chest pain, syncope, dyspnea on exertion, peripheral edema, prolonged cough, headaches, hemoptysis, abdominal pain, melena, hematochezia, severe indigestion/heartburn, hematuria, incontinence, genital sores, muscle weakness, suspicious skin lesions, transient blindness, difficulty  walking, depression, unusual weight change, abnormal bleeding, enlarged lymph nodes, angioedema, and breast masses.    Physical Exam  General:  Well-developed,well-nourished,in no acute distress; alert,appropriate and cooperative throughout examination Head:  normocephalic, atraumatic, and no abnormalities observed.   Eyes:  pupils equal and pupils round.   Ears:  R ear normal and L ear normal.   Nose:  no external deformity and no nasal discharge.   Mouth:  pharynx pink and moist.  no erythema.   Neck:  No deformities, masses, or tenderness noted. Lungs:  Normal respiratory effort, chest expands symmetrically. Lungs are clear to auscultation, no crackles or wheezes. Heart:  Normal rate and regular rhythm. S1 and S2 normal without gallop, murmur, click, rub or other extra sounds. Abdomen:  soft, normal bowel sounds, and no distention.   Msk:  No deformity or scoliosis noted of thoracic or lumbar spine.   Pulses:  R and L carotid,radial,femoral,dorsalis pedis and posterior tibial pulses are full and equal bilaterally Extremities:  No clubbing, cyanosis, edema, or deformity noted with normal full range of motion of all joints.   Neurologic:  alert & oriented X3 and abnormal gait.     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 As needed    Omeprazole 40 Mg Cpdr (Omeprazole) ..... One by mouth two times a day as needed  Problem # 2:  OSTEOPENIA (ICD-733.90) exercize, vitamin D and calcium Discussed medication use, applications of heat or ice, and exercises.  she is afraid of bisphosphonates needs weight bearing exercize  Problem # 3:  HEADACHE (ICD-784.0)  The followingstable medications were removed from the medication list:     Etodolac 300 Mg Caps (Etodolac) ..... One by mouth two times a day as needed Her updated medication list for this problem includes:    Adult Aspirin Ec Low Strength 81 Mg Tbec (Aspirin) ..... Once daily    Frova 2.5 Mg Tabs (Frovatriptan succinate) .Marland Kitchen... As needed    Lopressor 50 Mg Tabs (Metoprolol tartrate) .Marland Kitchen... 1/2 twice a day  Headache diary reviewed.  Problem # 4:  UNSPECIFIED MENOPAUSAL&POSTMENOPAUSAL DISORDER (ICD-627.9)  discussion of yearly paps and the fact that she should have these studies onlky every 2-3 years not every year due to current guidlines  Discussed treatment options.   Problem # 5:  POLYMYALGIA RHEUMATICA (ICD-725) the sed rate was good and the pt has not been tapered off  Problem # 6:  OSTEOPENIA (ICD-733.90)  Discussed medication use, applications of heat or ice, and exercises.   Complete Medication List: 1)  Fenofibrate 160 Mg Tabs (Fenofibrate) .Marland Kitchen.. 1 qd 2)  Adult Aspirin Ec Low Strength 81 Mg Tbec (Aspirin) .... Once daily 3)  Frova 2.5 Mg Tabs (Frovatriptan succinate) .... As needed 4)  Lopressor 50 Mg Tabs (Metoprolol tartrate) .... 1/2 twice a day 5)  Librax 2.5-5 Mg Caps (Clidinium-chlordiazepoxide) .... As needed 6)  Meclizine Hcl 25 Mg Tabs (Meclizine hcl) .... Prn 7)  Betamethasone Dipropionate 0.05 % Crea (Betamethasone dipropionate) .... Apply to skin as directed 8)  Fluticasone Propionate 50 Mcg/act Susp (Fluticasone propionate) .... Two spray q nare daily 9)  Prednisone 1 Mg Tabs (Prednisone) .... 4 once daily 10)  Omeprazole 40 Mg Cpdr (Omeprazole) .... One by mouth two times a day as needed 11)  Bayer Citracal Calcium With D Slow Release 1200mg   .... 1 once daily 12)  Sea-omega 30 1200 Mg Caps (Omega-3 fatty acids) .Marland Kitchen.. 1 once daily  Patient Instructions: 1)  try a one month taper off the prednisone 2)  go to 2 mg for 2 weeks, the 1 mg for two weeks then off 3)  take  eaciphex two times a day for 4 weeks to heal your stomach 4)   Please schedule a follow-up appointment in 4 months. Prescriptions: BETAMETHASONE DIPROPIONATE 0.05 %  CREA (BETAMETHASONE DIPROPIONATE) apply to skin as directed  #15 x 11   Entered and Authorized by:   Stacie Glaze MD   Signed by:   Stacie Glaze MD on 02/06/2010   Method used:   Electronically to        [facility]  [phone]* (retail)       [address]       [place], [place]  [address]       Ph: [phone] or [phone]       Fax: [phone]   RxID:   [id number]    Orders Added: 1)  Est. Patient Level IV [36644]   Immunization History:  Influenza Immunization History:    Influenza:  historical (01/20/2010)   Immunization History:  Influenza Immunization History:    Influenza:  Historical (01/20/2010)

## 2010-04-21 NOTE — Progress Notes (Signed)
Summary: Aciphex?  Phone Note Call from Patient   Caller: Patient Call For: Stacie Glaze MD Reason for Call: Acute Illness, Referral Summary of Call: Aciphex samples .......taking one by mouth two times a day.  Given 6 boxes and is down to 2 tablets.  Dr. Lovell Sheehan wanted her to be on it 4 weeks.  Total was #18.  Samples or prescription. On medicare and cannot Nico[facility] [place] [phone] Can leave message Initial call taken by: [facility] CMA AAMA,  February 16, 2010 9:23 AM  Follow-up for Phone Call        per dr Lovell Sheehan none avaiable but can take omeprazole two times a day and maybe some will come in- will call if it does Follow-up by: Willy Eddy, LPN,  February 16, 2010 1:44 PM

## 2010-04-21 NOTE — Assessment & Plan Note (Signed)
Summary: 3 month rov/njr   Vital Signs:  Patient profile:   70 year old female Height:      67 inches Weight:      138 pounds BMI:     21.69 Temp:     98.2 degrees F oral Pulse rate:   72 / minute Resp:     12 per minute BP sitting:   150 / 72  (left arm)  Vitals Entered By: Willy Eddy, LPN (November 04, 2009 10:12 AM) CC: roa Is Patient Diabetic? No   CC:  roa.  History of Present Illness: order CRP and sed rate fro polymyalgia rhematic so that rhematologist may consider lowering the prednisone has not been exercizing as well as she should and has committed to go back to the [facility] has stoped the premarin cream and has gone to [employer]elle wipes blood pressure is up  but had caffine this AM HA have been stable with mild episode due to allergies still has hot flashes but not every day is considering soy isoflavones ( i cool) has changed the calcium  Preventive Screening-Counseling & Management  Alcohol-Tobacco     Smoking Status: never     Passive Smoke Exposure: no  Problems Prior to Update: 1)  Unspecified Menopausal&postmenopausal Disorder  (ICD-627.9) 2)  Nephrolithiasis  (ICD-592.0) 3)  Urinary Tract Infection, Chronic  (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) 4)  Polymyalgia Rheumatica  (ICD-725) 5)  Degenerative Joint Disease, 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) 6)  Stress Reaction, Acute, With Psychomotor Disturbance  (ICD-308.2) 7)  Unspecified Arteritis  (ICD-447.6) 8)  Actinic Keratosis, Forehead, Left  (ICD-702.0) 9)  Preventive Health Care  (ICD-V70.0) 10)  Headache  (ICD-784.0) 11)  Low Back Pain  (ICD-724.2) 12)  Allergic Rhinitis  (ICD-477.9) 13)  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) 14)  Osteopenia  (ICD-733.90) 15)  Meniere's Disease  (ICD-386.00) 16)  Raynaud's Disease  (ICD-443.0)  Current Problems (verified): 1)  Nephrolithiasis  (ICD-592.0) 2)  Urinary Tract Infection, Chronic  (ICD-599.0) 3)  Polymyalgia Rheumatica  (ICD-725) 4)  Degenerative Joint  Disease, 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) 5)  Stress Reaction, Acute, With Psychomotor Disturbance  (ICD-308.2) 6)  Unspecified Arteritis  (ICD-447.6) 7)  Actinic Keratosis, Forehead, Left  (ICD-702.0) 8)  Preventive Health Care  (ICD-V70.0) 9)  Headache  (ICD-784.0) 10)  Low Back Pain  (ICD-724.2) 11)  Allergic Rhinitis  (ICD-477.9) 12)  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 1 Qd 2)  Adult Aspirin Ec Low Strength 81 Mg  Tbec (Aspirin) .... Once Daily 3)  Frova 2.5 Mg  Tabs (Frovatriptan Succinate) .... As Needed 4)  Calcium Citrate 200 Mg Tabs (Calcium Citrate) .... Three By Mouth Daily 5)  Lopressor 50 Mg  Tabs (Metoprolol Tartrate) .... 1/2 Twice A Day 6)  Librax 2.5-5 Mg  Caps (Clidinium-Chlordiazepoxide) .... As Needed 7)  Meclizine Hcl 25 Mg  Tabs (Meclizine Hcl) .... Prn 8)  Betamethasone Dipropionate 0.05 %  Crea (Betamethasone Dipropionate) .... Apply To Skin As Directed 9)  Fluticasone Propionate 50 Mcg/act Susp (Fluticasone Propionate) .... Two Spray Q Nare Daily 10)  Prednisone 1 Mg Tabs (Prednisone) .... 4 Once Daily 11)  Vitamin D 3 .Marland Kitchen.. 2000 Once Daily 12)  Omega 3 1000 .... Once Daily 13)  Premarin 0.625 Mg/gm Crea (Estrogens, Conjugated) .... One Gram M<w<f  Per Vagina 14)  Etodolac 300  Mg Caps (Etodolac) .... One By Mouth Bid 15)  Macrodantin 100 Mg Caps (Nitrofurantoin Macrocrystal) .Marland Kitchen.. 1 Once Daily 16)  Omeprazole 40 Mg Cpdr (Omeprazole) .... One By Mouth Daily  Current Medications (verified): 1)  Fenofibrate 160 Mg Tabs (Fenofibrate) .Marland Kitchen.. 1 Qd 2)  Adult Aspirin Ec Low Strength 81 Mg  Tbec (Aspirin) .... Once Daily 3)  Frova 2.5 Mg  Tabs (Frovatriptan Succinate) .... As Needed 4)  Lopressor 50 Mg  Tabs (Metoprolol Tartrate) .... 1/2 Twice A Day 5)  Librax 2.5-5 Mg  Caps  (Clidinium-Chlordiazepoxide) .... As Needed 6)  Meclizine Hcl 25 Mg  Tabs (Meclizine Hcl) .... Prn 7)  Betamethasone Dipropionate 0.05 %  Crea (Betamethasone Dipropionate) .... Apply To Skin As Directed 8)  Fluticasone Propionate 50 Mcg/act Susp (Fluticasone Propionate) .... Two Spray Q Nare Daily 9)  Prednisone 1 Mg Tabs (Prednisone) .... 4 Once Daily 10)  Omega 3 1000 .... Once Daily 11)  Etodolac 300 Mg Caps (Etodolac) .... One By Mouth Two Times A Day As Needed 12)  Omeprazole 40 Mg Cpdr (Omeprazole) .... One By Mouth Daily 13)  Bayer Citracal Calcium With D Slow Release 1200mg  .... 1 Once Daily  Allergies (verified): 1)  ! Pcn 2)  ! Vicodin 3)  ! Neomycin 4)  ! Pneumovax 23 (Pneumococcal Vac Polyvalent)  Past History:  Family History: Last updated: 10/26/06 mother died in MVA Father died of multiple myeloma at 55  Social History: Last updated: 11/08/2006 Married Alcohol use-no high stress job  Risk Factors: Smoking Status: never (11/04/2009) Passive Smoke Exposure: no (11/04/2009)  Past medical, surgical, family and social histories (including risk factors) reviewed, and no changes noted (except as noted below).  Past Medical History: Reviewed history from 03/09/2007 and no changes required. Osteopenia vasculitis PMR varicose veins with inflamation Allergic rhinitis Low back pain Headache  Past Surgical History: Reviewed history from 03/14/2008 and no changes required. Colonoscopy-11/23/2002 D&C-2004 Caesarean section Tonsillectomy bakers cyst left knee arthroscopy L knee   Family History: Reviewed history from 2006/10/26 and no changes required. mother died in MVA Father died of multiple myeloma at 57  Social History: Reviewed history from 11/08/2006 and no changes required. Married Alcohol use-no high stress job  Review of Systems  The patient denies anorexia, fever, weight loss, weight gain, vision loss, decreased hearing, hoarseness, chest  pain, syncope, dyspnea on exertion, peripheral edema, prolonged cough, headaches, hemoptysis, abdominal pain, melena, hematochezia, severe indigestion/heartburn, hematuria, incontinence, genital sores, muscle weakness, suspicious skin lesions, transient blindness, difficulty walking, depression, unusual weight change, abnormal bleeding, enlarged lymph nodes, angioedema, and breast masses.    Physical Exam  General:  Well-developed,well-nourished,in no acute distress; alert,appropriate and cooperative throughout examination Head:  normocephalic, atraumatic, and no abnormalities observed.   Eyes:  pupils equal and pupils round.   Ears:  R ear normal and L ear normal.   Neck:  No deformities, masses, or tenderness noted. Lungs:  Normal respiratory effort, chest expands symmetrically. Lungs are clear to auscultation, no crackles or wheezes. Heart:  Normal rate and regular rhythm. S1 and S2 normal without gallop, murmur, click, rub or other extra sounds. Abdomen:  soft, normal bowel sounds, and no distention.   Msk:  No deformity or scoliosis noted of thoracic or lumbar spine.   Pulses:  R and L carotid,radial,femoral,dorsalis pedis and posterior tibial pulses are full and equal bilaterally Neurologic:  alert & oriented X3 and abnormal gait.     Impression & Recommendations:  Problem # 1:  POLYMYALGIA RHEUMATICA (  ICD-725) Assessment Unchanged  moniter sed rate and crp  Orders: Venipuncture (04540) Specimen Handling (98119) TLB-Sedimentation Rate (ESR) (85652-ESR) TLB-CRP-High Sensitivity (C-Reactive Protein) (86140-FCRP)  Problem # 2:  UNSPECIFIED MENOPAUSAL&POSTMENOPAUSAL DISORDER (ICD-627.9)  takine soy protine isoflavones The following medications were removed from the medication list:    Premarin 0.625 Mg/gm Crea (Estrogens, conjugated) ..... One gram m<w<f  per vagina  Discussed treatment options.  soy  Problem # 3:  OSTEOPENIA (ICD-733.90)  disfcusson of vit d and  calcium  Discussed medication use, applications of heat or ice, and exercises.   Problem # 4:  HEADACHE (ICD-784.0) stable Her updated medication list for this problem includes:    Adult Aspirin Ec Low Strength 81 Mg Tbec (Aspirin) ..... Once daily    Frova 2.5 Mg Tabs (Frovatriptan succinate) .Marland Kitchen... As needed    Lopressor 50 Mg Tabs (Metoprolol tartrate) .Marland Kitchen... 1/2 twice a day    Etodolac 300 Mg Caps (Etodolac) ..... One by mouth two times a day as needed  Complete Medication List: 1)  Fenofibrate 160 Mg Tabs (Fenofibrate) .Marland Kitchen.. 1 qd 2)  Adult Aspirin Ec Low Strength 81 Mg Tbec (Aspirin) .... Once daily 3)  Frova 2.5 Mg Tabs (Frovatriptan succinate) .... As needed 4)  Lopressor 50 Mg Tabs (Metoprolol tartrate) .... 1/2 twice a day 5)  Librax 2.5-5 Mg Caps (Clidinium-chlordiazepoxide) .... As needed 6)  Meclizine Hcl 25 Mg Tabs (Meclizine hcl) .... Prn 7)  Betamethasone Dipropionate 0.05 % Crea (Betamethasone dipropionate) .... Apply to skin as directed 8)  Fluticasone Propionate 50 Mcg/act Susp (Fluticasone propionate) .... Two spray q nare daily 9)  Prednisone 1 Mg Tabs (Prednisone) .... 4 once daily 10)  Omega 3 1000  .... Once daily 11)  Etodolac 300 Mg Caps (Etodolac) .... One by mouth two times a day as needed 12)  Omeprazole 40 Mg Cpdr (Omeprazole) .... One by mouth daily 13)  Bayer Citracal Calcium With D Slow Release 1200mg   .... 1 once daily  Patient Instructions: 1)  Please schedule a follow-up appointment in 3 months.

## 2010-04-21 NOTE — Letter (Signed)
Summary: [facility]   Imported By: Sherian Rein 11/26/2009 13:28:48  _____________________________________________________________________  External Attachment:    Type:   Image     Comment:   External Document

## 2010-04-21 NOTE — Assessment & Plan Note (Signed)
Summary: 2 month rov/njr   Vital Signs:  Patient profile:   70 year old female Height:      67 inches Weight:      142 pounds BMI:     22.32 Temp:     98.2 degrees F oral Pulse rate:   72 / minute Resp:     14 per minute BP sitting:   110 / 70  (left arm)  Vitals Entered By: Willy Eddy, LPN (May 13, 2009 11:37 AM) CC: roa Comments holding   macrodnatin currently per dr Perley Jain   CC:  roa.  History of Present Illness: Has retired decreased stress blood pressure better HA are less and less severe  Follow-Up Visit      This is a Lori [name] who presents for Follow-up visit.  The patient denies chest pain, palpitations, dizziness, syncope, low blood sugar symptoms, high blood sugar symptoms, edema, SOB, DOE, PND, and orthopnea.  The patient reports taking meds as prescribed and monitoring BP.  When questioned about possible medication side effects, the patient notes dry cough.     the rhematologist lowered the prednisone  and the GYN will repoeat the bone density the pt is to repoeat the sed rate in may  Preventive Screening-Counseling & Management  Alcohol-Tobacco     Smoking Status: never     Passive Smoke Exposure: no  Pap Smear  Procedure date:  04/24/2009  Findings:       Specimen Adequacy: Satisfactory for evaluation.     Comments:      Repeat Pap in 1 year.     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) 13)  Vasculitis   (ICD-447.6) 14)  Osteopenia  (ICD-733.90) 15)  Meniere's Disease  (ICD-386.00) 16)  Raynaud's Disease  (ICD-443.0)  Current Problems (verified): 1)  Nephrolithiasis  (ICD-592.0) 2)  Urinary Tract Infection, Chronic  (ICD-599.0) 3)  Polymyalgia Rheumatica  (ICD-725) 4)  Degenerative Joint Disease, 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 Once Daily 2)  Fenofibrate 160 Mg Tabs (Fenofibrate) .Marland Kitchen.. 1 Qd 3)  Adult Aspirin Ec Low Strength 81 Mg  Tbec (Aspirin) .... Once Daily 4)  Frova 2.5 Mg  Tabs (Frovatriptan Succinate) .... As Needed 5)  Calcium Citrate 200 Mg Tabs (Calcium Citrate) .... Three By Mouth Daily 6)  Lopressor 50 Mg  Tabs (Metoprolol Tartrate) .... 1/2 Twice A Day 7)  Librax 2.5-5 Mg  Caps (Clidinium-Chlordiazepoxide) .... As Needed 8)  Meclizine Hcl 25 Mg  Tabs (  Meclizine Hcl) .... Prn 9)  Betamethasone Dipropionate 0.05 %  Crea (Betamethasone Dipropionate) .... Apply To Skin As Directed 10)  Veramyst 27.5 Mcg/spray Susp (Fluticasone Furoate) .... Two Sprays Q Nare Daily 11)  Prednisone 5 Mg Tabs (Prednisone) .Marland Kitchen.. 1 Once Daily 12)  Vitamin D 3 .Marland Kitchen.. 2000 Once Daily 13)  Omega 3 1000 .... Once Daily 14)  Premarin 0.625 Mg/gm Crea (Estrogens, Conjugated) .... One Gram M<w<f  Per Vagina 15)  Etodolac 300 Mg Caps (Etodolac) .... One By Mouth Bid 16)  Macrodantin 100 Mg Caps (Nitrofurantoin Macrocrystal) .Marland Kitchen.. 1 Once Daily  Current  Medications (verified): 1)  Nexium 40 Mg Cpdr (Esomeprazole Magnesium) .... Once Daily 2)  Fenofibrate 160 Mg Tabs (Fenofibrate) .Marland Kitchen.. 1 Qd 3)  Adult Aspirin Ec Low Strength 81 Mg  Tbec (Aspirin) .... Once Daily 4)  Frova 2.5 Mg  Tabs (Frovatriptan Succinate) .... As Needed 5)  Calcium Citrate 200 Mg Tabs (Calcium Citrate) .... Three By Mouth Daily 6)  Lopressor 50 Mg  Tabs (Metoprolol Tartrate) .... 1/2 Twice A Day 7)  Librax 2.5-5 Mg  Caps (Clidinium-Chlordiazepoxide) .... As Needed 8)  Meclizine Hcl 25 Mg  Tabs (Meclizine Hcl) .... Prn 9)  Betamethasone Dipropionate 0.05 %  Crea (Betamethasone Dipropionate) .... Apply To Skin As Directed 10)  Veramyst 27.5 Mcg/spray Susp (Fluticasone Furoate) .... Two Sprays Q Nare Daily 11)  Prednisone 5 Mg Tabs (Prednisone) .Marland Kitchen.. 1 Once Daily 12)  Vitamin D 3 .Marland Kitchen.. 2000 Once Daily 13)  Omega 3 1000 .... Once Daily 14)  Premarin 0.625 Mg/gm Crea (Estrogens, Conjugated) .... One Gram M<w<f  Per Vagina 15)  Etodolac 300 Mg Caps (Etodolac) .... One By Mouth Bid 16)  Macrodantin 100 Mg Caps (Nitrofurantoin Macrocrystal) .Marland Kitchen.. 1 Once Daily  Allergies (verified): 1)  ! Pcn 2)  ! Vicodin 3)  ! Neomycin 4)  ! Pneumovax 23 (Pneumococcal Vac Polyvalent)  Past History:  Family History: Last updated: 30-Oct-2006 mother died in MVA Father died of multiple myeloma at 70  Social History: Last updated: 11/08/2006 Married Alcohol use-no high stress job  Risk Factors: Smoking Status: never (05/13/2009) Passive Smoke Exposure: no (05/13/2009)  Past medical, surgical, family and social histories (including risk factors) reviewed, and no changes noted (except as noted below).  Past Medical History: Reviewed history from 03/09/2007 and no changes required. Osteopenia vasculitis PMR varicose veins with inflamation Allergic rhinitis Low back pain Headache  Past Surgical History: Reviewed history from 03/14/2008 and no changes  required. Colonoscopy-11/23/2002 D&C-2004 Caesarean section Tonsillectomy bakers cyst left knee arthroscopy L knee  PMH-FH-SH reviewed-no changes except otherwise noted  Family History: Reviewed history from Oct 30, 2006 and no changes required. mother died in MVA Father died of multiple myeloma at 39  Social History: Reviewed history from 11/08/2006 and no changes required. Married Alcohol use-no high stress job  Review of Systems  The patient denies anorexia, fever, weight loss, weight gain, vision loss, decreased hearing, hoarseness, chest pain, syncope, dyspnea on exertion, peripheral edema, prolonged cough, headaches, hemoptysis, abdominal pain, melena, hematochezia, severe indigestion/heartburn, hematuria, incontinence, genital sores, muscle weakness, suspicious skin lesions, transient blindness, difficulty walking, depression, unusual weight change, abnormal bleeding, enlarged lymph nodes, angioedema, breast masses, and testicular masses.    Physical Exam  General:  Well-developed,well-nourished,in no acute distress; alert,appropriate and cooperative throughout examination Head:  normocephalic, atraumatic, and no abnormalities observed.   Ears:  R ear normal and L ear normal.   Nose:  no external deformity and no nasal discharge.   Neck:  No deformities, masses, or tenderness noted. Lungs:  Normal respiratory effort, chest expands symmetrically. Lungs are clear to auscultation, no crackles or wheezes. Heart:  Normal rate and regular rhythm. S1 and S2 normal without gallop, murmur, click, rub or other extra sounds. Msk:  decreased ROM, joint tenderness, and joint swelling.   Extremities:  No clubbing, cyanosis, edema, or deformity noted with normal full range of motion of all joints.   Neurologic:  alert & oriented X3.     Impression & Recommendations:  Problem # 1:  LOW BACK PAIN (ICD-724.2)  Her updated medication list for this problem includes:    Adult Aspirin Ec Low  Strength 81 Mg Tbec (Aspirin) ..... Once daily    Etodolac 300 Mg Caps (Etodolac) ..... One by mouth bid  Discussed use of moist heat or ice, modified activities, medications, and stretching/strengthening exercises. Back care instructions given. To be seen in 2 weeks if no improvement; sooner if worsening of symptoms.   Problem # 2:  HEADACHE (ICD-784.0) Assessment: Unchanged  Her updated medication list for this problem includes:    Adult Aspirin Ec Low Strength 81 Mg Tbec (Aspirin) ..... Once daily    Frova 2.5 Mg Tabs (Frovatriptan succinate) .Marland Kitchen... As needed    Lopressor 50 Mg Tabs (Metoprolol tartrate) .Marland Kitchen... 1/2 twice a day    Etodolac 300 Mg Caps (Etodolac) ..... One by mouth bid  Headache diary reviewed.  Problem # 3:  VASCULITIS (ICD-447.6) follow ed by rhematology  Problem # 4:  MENIERE'S DISEASE (ICD-386.00) stable  Complete Medication List: 1)  Fenofibrate 160 Mg Tabs (Fenofibrate) .Marland Kitchen.. 1 qd 2)  Adult Aspirin Ec Low Strength 81 Mg Tbec (Aspirin) .... Once daily 3)  Frova 2.5 Mg Tabs (Frovatriptan succinate) .... As needed 4)  Calcium Citrate 200 Mg Tabs (Calcium citrate) .... Three by mouth daily 5)  Lopressor 50 Mg Tabs (Metoprolol tartrate) .... 1/2 twice a day 6)  Librax 2.5-5 Mg Caps (Clidinium-chlordiazepoxide) .... As needed 7)  Meclizine Hcl 25 Mg Tabs (Meclizine hcl) .... Prn 8)  Betamethasone Dipropionate 0.05 % Crea (Betamethasone dipropionate) .... Apply to skin as directed 9)  Fluticasone Propionate 50 Mcg/act Susp (Fluticasone propionate) .... Two spray q nare daily 10)  Prednisone 1 Mg Tabs (Prednisone) .... 4 once daily 11)  Vitamin D 3  .Marland Kitchen.. 2000 once daily 12)  Omega 3 1000  .... Once daily 13)  Premarin 0.625 Mg/gm Crea (Estrogens, conjugated) .... One gram m<w<f  per vagina 14)  Etodolac 300 Mg Caps (Etodolac) .... One by mouth bid 15)  Macrodantin 100 Mg Caps (Nitrofurantoin macrocrystal) .Marland Kitchen.. 1 once daily 16)  Omeprazole 40 Mg Cpdr (Omeprazole)  .... One by mouth daily  Patient Instructions: 1)  Please schedule a follow-up appointment in 3 months. 2)  pnemovax if she has been well 3)  BMP prior to visit, ICD-9: 401.9 4)  Hepatic Panel prior to visit, ICD-9:995.20 5)  Lipid Panel prior to visit, ICD-9:272.4 Prescriptions: FENOFIBRATE 160 MG TABS (FENOFIBRATE) 1 qd  #90 x 3   Entered and Authorized by:   Stacie Glaze MD   Signed by:   Stacie Glaze MD on 05/13/2009   Method used:   Electronically to        [facility]  [phone]* (retail)       [address]       [place], [place]  [address]       Ph: [phone]  or [phone]       Fax: [phone]   RxID:   [id number] OMEPRAZOLE 40 MG CPDR (OMEPRAZOLE) one by mouth daily  #30 x 11   Entered and Authorized by:   Stacie Glaze MD   Signed by:   Stacie Glaze MD on 05/13/2009   Method used:   Electronically to        [facility]  [phone]* (retail)       [address]       [place], [place]  [address]       Ph: [phone] or [phone]       Fax: [phone]   RxID:   [id number] FLUTICASONE PROPIONATE 50 MCG/ACT SUSP (FLUTICASONE PROPIONATE) two spray q nare daily  #1 x 11   Entered and Authorized by:   Stacie Glaze MD   Signed by:   Stacie Glaze MD on 05/13/2009   Method used:   Electronically to        [facility]  [phone]* (retail)       [address]       [place], [place]  [address]       Ph: [phone] or [phone]       Fax: [phone]   RxID:   [id number]   Prevention & Chronic Care Immunizations   Influenza vaccine: Fluvax 3+  (12/17/2008)    Tetanus booster: 03/23/2003: Historical    Pneumococcal vaccine: Historical  (03/19/2006)    H. zoster vaccine: Not documented  Colorectal Screening   Hemoccult: Not documented    Colonoscopy: normal  (11/23/2007)   Colonoscopy due: 11/2014  Other Screening   Pap  smear:  Specimen Adequacy: Satisfactory for evaluation.     (04/24/2009)   Pap smear due: 07/2009    Mammogram: normal  (02/18/2009)   Mammogram due: 02/2010    DXA bone density scan: Not documented   Smoking status: never  (05/13/2009)  Lipids   Total Cholesterol: 187  (03/11/2009)   LDL: 102  (03/11/2009)   LDL Direct: Not documented   HDL: 70.20  (03/11/2009)   Triglycerides: 76.0  (03/11/2009)

## 2010-04-21 NOTE — Assessment & Plan Note (Signed)
Summary: 3 MONTH ROV/NJR   Vital Signs:  Patient profile:   70 year old female Height:      67 inches Weight:      141 pounds BMI:     22.16 Temp:     98.2 degrees F oral Pulse rate:   72 / minute Resp:     14 per minute BP sitting:   136 / 74  (left arm)  Vitals Entered By: Willy Eddy, LPN (Aug 05, 2009 10:11 AM) CC: [facility]   CC:  [facility].  History of Present Illness: the pt present for folow up fro lipid screening labs stable head ache pattern  Follow-Up Visit      This is a 70 year old woman who presents for Follow-up visit.  The patient denies chest pain, palpitations, dizziness, syncope, low blood sugar symptoms, high blood sugar symptoms, edema, SOB, DOE, PND, and orthopnea.  Since the last visit the patient notes no new problems or concerns.  The patient reports taking meds as prescribed, monitoring BP, and dietary compliance.  When questioned about possible medication side effects, the patient notes cramping and muscle aches.    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) 13)  Vasculitis  (ICD-447.6) 14)  Osteopenia  (ICD-733.90) 15)  Meniere's Disease  (ICD-386.00) 16)  Raynaud's Disease  (ICD-443.0)  Current Problems (verified): 1)  Nephrolithiasis  (ICD-592.0) 2)  Urinary Tract Infection, Chronic  (ICD-599.0) 3)   Polymyalgia Rheumatica  (ICD-725) 4)  Degenerative Joint Disease, 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 1 Qd 2)  Adult Aspirin Ec Low Strength 81 Mg  Tbec (Aspirin) .... Once Daily 3)  Frova 2.5 Mg  Tabs (Frovatriptan Succinate) .... As Needed 4)  Calcium Citrate 200 Mg Tabs (Calcium Citrate) .... Three By Mouth Daily 5)  Lopressor 50 Mg  Tabs (Metoprolol Tartrate) .... 1/2 Twice A Day 6)  Librax 2.5-5 Mg  Caps (Clidinium-Chlordiazepoxide) .... As Needed 7)  Meclizine Hcl 25 Mg  Tabs (Meclizine Hcl) .... Prn 8)  Betamethasone Dipropionate 0.05 %  Crea (Betamethasone Dipropionate) .... Apply To Skin As Directed 9)  Fluticasone Propionate 50 Mcg/act Susp (Fluticasone Propionate) .... Two Spray Q Nare Daily 10)  Prednisone 1 Mg Tabs (Prednisone) .... 4 Once Daily 11)  Vitamin D 3 .Marland Kitchen.. 2000 Once Daily 12)  Omega 3 1000 .... Once Daily 13)  Premarin 0.625 Mg/gm Crea (Estrogens, Conjugated) .... One Gram M<w<f  Per Vagina 14)  Etodolac 300 Mg Caps (Etodolac) .... One By Mouth Bid 15)  Macrodantin 100 Mg Caps (Nitrofurantoin Macrocrystal) .Marland Kitchen.. 1 Once Daily 16)  Omeprazole 40 Mg Cpdr (Omeprazole) .... One By Mouth Daily  Current Medications (verified): 1)  Fenofibrate 160 Mg Tabs (Fenofibrate) .Marland Kitchen.. 1 Qd 2)  Adult Aspirin Ec Low Strength 81 Mg  Tbec (Aspirin) .... Once Daily 3)  Frova 2.5 Mg  Tabs (Frovatriptan Succinate) .... As Needed 4)  Calcium  Citrate 200 Mg Tabs (Calcium Citrate) .... Three By Mouth Daily 5)  Lopressor 50 Mg  Tabs (Metoprolol Tartrate) .... 1/2 Twice A Day 6)  Librax 2.5-5 Mg  Caps (Clidinium-Chlordiazepoxide) .... As Needed 7)  Meclizine Hcl 25 Mg  Tabs (Meclizine Hcl) .... Prn 8)  Betamethasone Dipropionate 0.05 %  Crea (Betamethasone Dipropionate) .... Apply To Skin As Directed 9)  Fluticasone Propionate 50 Mcg/act Susp (Fluticasone Propionate) .... Two Spray Q Nare Daily 10)  Prednisone 1 Mg Tabs (Prednisone) .... 4 Once Daily 11)  Vitamin D 3 .Marland Kitchen.. 2000 Once Daily 12)  Omega 3 1000 .... Once Daily 13)  Premarin 0.625 Mg/gm Crea (Estrogens, Conjugated) .... One Gram M<w<f  Per Vagina 14)  Etodolac 300 Mg Caps (Etodolac) .... One By Mouth Bid 15)  Macrodantin 100 Mg Caps (Nitrofurantoin Macrocrystal) .Marland Kitchen.. 1 Once Daily 16)  Omeprazole 40 Mg Cpdr (Omeprazole) .... One By Mouth Daily  Allergies (verified): 1)  ! Pcn 2)  ! Vicodin 3)  ! Neomycin 4)  ! Pneumovax 23 (Pneumococcal Vac Polyvalent)  Past History:  Family History: Last updated: 10/15/06 mother died in MVA Father died of multiple myeloma at 30  Social History: Last updated: 11/08/2006 Married Alcohol use-no high stress job  Risk Factors: Smoking Status: never (08/05/2009) Passive Smoke Exposure: no (08/05/2009)  Past medical, surgical, family and social histories (including risk factors) reviewed, and no changes noted (except as noted below).  Past Medical History: Reviewed history from 03/09/2007 and no changes required. Osteopenia vasculitis PMR varicose veins with inflamation Allergic rhinitis Low back pain Headache  Past Surgical History: Reviewed history from 03/14/2008 and no changes required. Colonoscopy-11/23/2002 D&C-2004 Caesarean section Tonsillectomy bakers cyst left knee arthroscopy L knee   Family History: Reviewed history from Oct 15, 2006 and no changes required. mother died in MVA Father died of multiple  myeloma at 23  Social History: Reviewed history from 11/08/2006 and no changes required. Married Alcohol use-no high stress job  Review of Systems  The patient denies anorexia, fever, weight loss, weight gain, vision loss, decreased hearing, hoarseness, chest pain, syncope, dyspnea on exertion, peripheral edema, prolonged cough, headaches, hemoptysis, abdominal pain, melena, hematochezia, severe indigestion/heartburn, hematuria, incontinence, genital sores, muscle weakness, suspicious skin lesions, transient blindness, difficulty walking, depression, unusual weight change, abnormal bleeding, enlarged lymph nodes, angioedema, and breast masses.    Physical Exam  General:  Well-developed,well-nourished,in no acute distress; alert,appropriate and cooperative throughout examination Head:  normocephalic, atraumatic, and no abnormalities observed.   Ears:  R ear normal and L ear normal.   Nose:  no external deformity and no nasal discharge.   Mouth:  pharynx pink and moist.  no erythema.   Neck:  No deformities, masses, or tenderness noted. Lungs:  Normal respiratory effort, chest expands symmetrically. Lungs are clear to auscultation, no crackles or wheezes. Heart:  Normal rate and regular rhythm. S1 and S2 normal without gallop, murmur, click, rub or other extra sounds. Abdomen:  soft,  normal bowel sounds, and no distention.   Msk:  No deformity or scoliosis noted of thoracic or lumbar spine.   Pulses:  R and L carotid,radial,femoral,dorsalis pedis and posterior tibial pulses are full and equal bilaterally Extremities:  No clubbing, cyanosis, edema, or deformity noted with normal full range of motion of all joints.   Neurologic:  No cranial nerve deficits noted. Station and gait are normal. Plantar reflexes are down-going bilaterally. DTRs are symmetrical throughout. Sensory, motor and coordinative functions appear intact.   Impression & Recommendations:  Problem # 1:  HEADACHE  (ICD-784.0)  stable patter Her updated medication list for this problem includes:    Adult Aspirin Ec Low Strength 81 Mg Tbec (Aspirin) ..... Once daily    Frova 2.5 Mg Tabs (Frovatriptan succinate) .Marland Kitchen... As needed    Lopressor 50 Mg Tabs (Metoprolol tartrate) .Marland Kitchen... 1/2 twice a day    Etodolac 300 Mg Caps (Etodolac) ..... One by mouth bid  Headache diary reviewed.  Problem # 2:  VARICOSE VEINS LOWER EXTREMITIES W/INFLAMMATION (ICD-454.1) stable  Problem # 3:  MENIERE'S DISEASE (ICD-386.00) stable  Problem # 4:  RAYNAUD'S DISEASE (ICD-443.0) stable  Complete Medication List: 1)  Fenofibrate 160 Mg Tabs (Fenofibrate) .Marland Kitchen.. 1 qd 2)  Adult Aspirin Ec Low Strength 81 Mg Tbec (Aspirin) .... Once daily 3)  Frova 2.5 Mg Tabs (Frovatriptan succinate) .... As needed 4)  Calcium Citrate 200 Mg Tabs (Calcium citrate) .... Three by mouth daily 5)  Lopressor 50 Mg Tabs (Metoprolol tartrate) .... 1/2 twice a day 6)  Librax 2.5-5 Mg Caps (Clidinium-chlordiazepoxide) .... As needed 7)  Meclizine Hcl 25 Mg Tabs (Meclizine hcl) .... Prn 8)  Betamethasone Dipropionate 0.05 % Crea (Betamethasone dipropionate) .... Apply to skin as directed 9)  Fluticasone Propionate 50 Mcg/act Susp (Fluticasone propionate) .... Two spray q nare daily 10)  Prednisone 1 Mg Tabs (Prednisone) .... 4 once daily 11)  Vitamin D 3  .Marland Kitchen.. 2000 once daily 12)  Omega 3 1000  .... Once daily 13)  Premarin 0.625 Mg/gm Crea (Estrogens, conjugated) .... One gram m<w<f  per vagina 14)  Etodolac 300 Mg Caps (Etodolac) .... One by mouth bid 15)  Macrodantin 100 Mg Caps (Nitrofurantoin macrocrystal) .Marland Kitchen.. 1 once daily 16)  Omeprazole 40 Mg Cpdr (Omeprazole) .... One by mouth daily  Other Orders: Venipuncture ([address]) TLB-Sedimentation Rate (ESR) (85652-ESR) TLB-CBC Platelet - w/Differential (85025-CBCD)  Patient Instructions: 1)  Please schedule a follow-up appointment in 3 months. Prescriptions: FENOFIBRATE 160 MG TABS  (FENOFIBRATE) 1 qd  #90 x 3   Entered and Authorized by:   Stacie Glaze MD   Signed by:   Stacie Glaze MD on 08/05/2009   Method used:   Electronically to        [facility]  [phone]* (retail)       [address]       [place], [place]  [address]       Ph: [phone] or [phone]       Fax: [phone]   RxID:   [id number]

## 2010-04-21 NOTE — Letter (Signed)
Summary: [facility]  [facility]   Imported By: Maryln Gottron 06/12/2009 12:49:33  _____________________________________________________________________  External Attachment:    Type:   Image     Comment:   External Document

## 2010-04-21 NOTE — Progress Notes (Signed)
Summary: GERD  Phone Note Call from Patient   Caller: Patient Call For: Stacie Glaze MD Reason for Call: Acute Illness Summary of Call: Pt is having increasing symptoms of GERD, and the Prilosec is not helping as much.  Using some baking soda.  Has appt the 18th but would like to be seen sooner. Initial call taken by: [facility] CMA AAMA,  January 28, 2010 12:56 PM  Follow-up for Phone Call        we dont have any opening s before then,but dr Lovell Sheehan said to NOT TAKE THE BAKING SODA-STOP THAT- AND TAKE THE PRILOSEC two times a day  Follow-up by: Willy Eddy, LPN,  January 28, 2010 1:55 PM  Additional Follow-up for Phone Call Additional follow up Details #1::        Pt notified. Additional Follow-up by: Lynann Beaver CMA AAMA,  January 28, 2010 2:14 PM    New/Updated Medications: OMEPRAZOLE 40 MG CPDR (OMEPRAZOLE) one by mouth two times a day

## 2010-04-21 NOTE — Letter (Signed)
Summary: [facility]   Imported By: Maryln Gottron 08/19/2009 12:47:28  _____________________________________________________________________  External Attachment:    Type:   Image     Comment:   External Document

## 2010-04-21 NOTE — Progress Notes (Signed)
Summary: sed rate  Phone Note Call from Patient Call back at Work Phone [phone]   Reason for Call: Acute Illness Summary of Call: Sed rate to be faxed to Dr. Jimmy Footman fax # [phone], phone [phone].  Dr. Lovell Sheehan told me to call Rushie Goltz to be sure done this am. Initial call taken by: Rudy Jew, RN,  Aug 06, 2009 10:15 AM  Follow-up for Phone Call        faxed sed rate Follow-up by: Willy Eddy, LPN,  Aug 06, 2009 10:18 AM

## 2010-04-23 NOTE — Letter (Signed)
Summary: [facility]   Imported By: Maryln Gottron 04/06/2010 10:38:09  _____________________________________________________________________  External Attachment:    Type:   Image     Comment:   External Document

## 2010-06-08 ENCOUNTER — Other Ambulatory Visit: Payer: Self-pay | Admitting: *Deleted

## 2010-06-08 MED ORDER — METOPROLOL TARTRATE 50 MG PO TABS: 50.0000 mg | ORAL_TABLET | Freq: Every day | ORAL | Status: DC

## 2010-06-10 ENCOUNTER — Encounter: Payer: Self-pay | Admitting: Internal Medicine

## 2010-06-12 ENCOUNTER — Encounter: Payer: Self-pay | Admitting: Internal Medicine

## 2010-06-12 ENCOUNTER — Ambulatory Visit (INDEPENDENT_AMBULATORY_CARE_PROVIDER_SITE_OTHER): Payer: Medicare Other | Admitting: Internal Medicine

## 2010-06-12 VITALS — BP 130/80 | HR 80 | Temp 98.2°F | Resp 14 | Ht 66.0 in | Wt 131.0 lb

## 2010-06-12 DIAGNOSIS — R55 Syncope and collapse: Secondary | ICD-10-CM | POA: Insufficient documentation

## 2010-06-12 DIAGNOSIS — Z79899 Other long term (current) drug therapy: Secondary | ICD-10-CM

## 2010-06-12 LAB — CBC WITH DIFFERENTIAL/PLATELET
Basophils Relative: 0.3 % (ref 0.0–3.0)
Eosinophils Relative: 3.6 % (ref 0.0–5.0)
HCT: 38.2 % (ref 36.0–46.0)
Lymphs Abs: 2.7 10*3/uL (ref 0.7–4.0)
MCHC: 33.2 g/dL (ref 30.0–36.0)
MCV: 87.4 fl (ref 78.0–100.0)
Monocytes Absolute: 0.8 10*3/uL (ref 0.1–1.0)
RBC: 4.37 Mil/uL (ref 3.87–5.11)
WBC: 7.4 10*3/uL (ref 4.5–10.5)

## 2010-06-12 NOTE — Progress Notes (Signed)
Subjective:    Patient ID: Lori [name], female    DOB: Mar 22, 1941, 70 y.o.   MRN: [id number]  HPI   patient is an 70 year old white female with a history of polymyalgia rheumatica and anxiety who presents with a due to worsening symptom of a sense of palpitations in her chest and at least on one occasion presyncope.  She states that intermittently over the past few months she has noticed a fluttering in her chest sometimes it lasts for a few moments and then goes away sometimes it lasts for a longer period of time and a recent occasion she felt like she was going to pass out before the symptoms resolved she cannot relate this to meals sugar intake or skip meals she cannot associate this with chest pain nausea or sweating  Review of Systems  Constitutional: Negative for activity change, appetite change and fatigue.  HENT: Negative for ear pain, congestion, neck pain, postnasal drip and sinus pressure.   Eyes: Negative for redness and visual disturbance.  Respiratory: Negative for cough, shortness of breath and wheezing.   Gastrointestinal: Negative for abdominal pain and abdominal distention.  Genitourinary: Negative for dysuria, frequency and menstrual problem.  Musculoskeletal: Negative for myalgias, joint swelling and arthralgias.  Skin: Negative for rash and wound.  Neurological: Negative for dizziness, weakness and headaches.  Hematological: Negative for adenopathy. Does not bruise/bleed easily.  Psychiatric/Behavioral: Negative for sleep disturbance and decreased concentration.   Past Medical History  Diagnosis Date  . Osteopenia   . Vasculitis   . PMR (polymyalgia rheumatica)   . Varicose veins with inflammation   . Allergy   . Low back pain   . Headache    Past Surgical History  Procedure Date  . Dilation and curettage of uterus   . Cesarean section   . Tonsillectomy   . Popliteal synovial cyst excision   . Arthroscopic knee     reports that she has never  smoked. She does not have any smokeless tobacco history on file. She reports that she does not drink alcohol or use illicit drugs. family history includes Multiple myeloma in her father. Allergies  Allergen Reactions  . Hydrocodone-Acetaminophen   . Neomycin   . Penicillins     REACTION: Arm swelling  . Pneumococcal Vaccine Polyvalent        Objective:   Physical Exam  Constitutional: She is oriented to person, place, and time. She appears well-developed and well-nourished. No distress.  HENT:  Head: Normocephalic and atraumatic.  Right Ear: External ear normal.  Left Ear: External ear normal.  Nose: Nose normal.  Mouth/Throat: Oropharynx is clear and moist.  Eyes: Conjunctivae and EOM are normal. Pupils are equal, round, and reactive to light.  Neck: Normal range of motion. Neck supple. No JVD present. No tracheal deviation present. No thyromegaly present.  Cardiovascular: Normal rate, regular rhythm, normal heart sounds and intact distal pulses.   No murmur heard. Pulmonary/Chest: Effort normal and breath sounds normal. She has no wheezes. She exhibits no tenderness.  Abdominal: Soft. Bowel sounds are normal.  Musculoskeletal: Normal range of motion. She exhibits no edema and no tenderness.  Lymphadenopathy:    She has no cervical adenopathy.  Neurological: She is alert and oriented to person, place, and time. She has normal reflexes. No cranial nerve deficit.  Skin: Skin is warm and dry. She is not diaphoretic.  Psychiatric: She has a normal mood and affect. Her behavior is normal.  Assessment & Plan:   the patient will be scheduled for a Holter monitor and an echocardiogram to make sure that there are no structural abnormalities of the heart wall motion abnormalities that may indicate injury undetected as well as to monitor her heart rhythm for 48 hours to see if he can contact intermittent atrial fibrillation or of ventricular tachycardia.   the patient's  symptoms did not seem at this time malignant but she was informed that should her symptoms person we should she ever lose consciousness she should present to the emergency room as fast as possible and if she ever has the symptoms associated with sweating nausea or chest pain she should also transport to the emergency room as soon as possible

## 2010-06-12 NOTE — Assessment & Plan Note (Signed)
Patient has had multiple episodes of fluttering in the chest they sometimes last for a moment they sometimes last a bit longer and on at least one occasion she felt like she might pass out. She can relate at least one episode to eating sugar cookies but there is no clear pattern of association with meals eating or not eating she did not become short of breath or diaphoretic just a sensation of weakness. She is on Lopressor 25 mg the be prudent at this time to obtain a 48 hour Holter monitor which will let us detected any dysrhythmia she might have and an echocardiogram to make sure all valves are normal and the heart muscle is moving

## 2010-06-12 NOTE — Progress Notes (Signed)
Addended by: Melchor Amour on: 06/12/2010 09:52 AM   Modules accepted: Orders

## 2010-06-16 ENCOUNTER — Other Ambulatory Visit: Payer: Self-pay | Admitting: Internal Medicine

## 2010-06-19 ENCOUNTER — Other Ambulatory Visit (HOSPITAL_COMMUNITY): Payer: Self-pay | Admitting: Internal Medicine

## 2010-06-23 ENCOUNTER — Encounter (INDEPENDENT_AMBULATORY_CARE_PROVIDER_SITE_OTHER): Payer: Medicare Other

## 2010-06-23 ENCOUNTER — Ambulatory Visit (HOSPITAL_COMMUNITY): Payer: Medicare Other | Attending: Internal Medicine

## 2010-06-23 DIAGNOSIS — R55 Syncope and collapse: Secondary | ICD-10-CM

## 2010-06-23 DIAGNOSIS — R002 Palpitations: Secondary | ICD-10-CM

## 2010-07-15 ENCOUNTER — Ambulatory Visit (INDEPENDENT_AMBULATORY_CARE_PROVIDER_SITE_OTHER): Payer: Medicare Other | Admitting: Internal Medicine

## 2010-07-15 ENCOUNTER — Encounter: Payer: Self-pay | Admitting: Internal Medicine

## 2010-07-15 VITALS — BP 134/80 | HR 72 | Temp 98.5°F | Resp 14 | Ht 66.0 in | Wt 129.0 lb

## 2010-07-15 DIAGNOSIS — M353 Polymyalgia rheumatica: Secondary | ICD-10-CM

## 2010-07-15 DIAGNOSIS — R55 Syncope and collapse: Secondary | ICD-10-CM

## 2010-07-15 DIAGNOSIS — F430 Acute stress reaction: Secondary | ICD-10-CM

## 2010-07-15 NOTE — Assessment & Plan Note (Signed)
Anxiety and panic play a role in these episodes exorcize and stress release!

## 2010-07-15 NOTE — Assessment & Plan Note (Signed)
History of presyncopal episodes an echocardiogram was obtained which was normal hemoglobin and hematocrit obtained which were normal White cell count was normal. Do not know if these were associated with palpitations and certainly there is some risk for atrial arrhythmia but structurally her heart is normal and no intervention is planned at this time

## 2010-07-15 NOTE — Progress Notes (Signed)
  Subjective:    Patient ID: Lori [name], female    DOB: [birth year], 70 y.o.   MRN: [id number]  HPI Patient presents for followup of syncopal episode she had an echocardiogram which showed normal heart structure there is probably a multiple causes for her syncopal episodes we discussed conditioning we discussed the role of anxiety and stress we discussed possible atrial fibrillation   Review of Systems  Constitutional: Negative for activity change, appetite change and fatigue.  HENT: Negative for ear pain, congestion, neck pain, postnasal drip and sinus pressure.   Eyes: Negative for redness and visual disturbance.  Respiratory: Negative for cough, shortness of breath and wheezing.   Gastrointestinal: Negative for abdominal pain and abdominal distention.  Genitourinary: Negative for dysuria, frequency and menstrual problem.  Musculoskeletal: Negative for myalgias, joint swelling and arthralgias.  Skin: Negative for rash and wound.  Neurological: Negative for dizziness, weakness and headaches.  Hematological: Negative for adenopathy. Does not bruise/bleed easily.  Psychiatric/Behavioral: Negative for sleep disturbance and decreased concentration.   Past Medical History  Diagnosis Date  . Osteopenia   . Vasculitis   . PMR (polymyalgia rheumatica)   . Varicose veins with inflammation   . Allergy   . Low back pain   . Headache    Past Surgical History  Procedure Date  . Dilation and curettage of uterus   . Cesarean section   . Tonsillectomy   . Popliteal synovial cyst excision   . Arthroscopic knee     reports that she has never smoked. She does not have any smokeless tobacco history on file. She reports that she does not drink alcohol or use illicit drugs. family history includes Multiple myeloma in her father. Allergies  Allergen Reactions  . Hydrocodone-Acetaminophen   . Neomycin   . Penicillins     REACTION: Arm swelling  . Pneumococcal Vaccine Polyvalent       Objective:   Physical Exam  Constitutional: She is oriented to person, place, and time. She appears well-developed and well-nourished. No distress.  HENT:  Head: Normocephalic and atraumatic.  Right Ear: External ear normal.  Left Ear: External ear normal.  Nose: Nose normal.  Mouth/Throat: Oropharynx is clear and moist.  Eyes: Conjunctivae and EOM are normal. Pupils are equal, round, and reactive to light.  Neck: Normal range of motion. Neck supple. No JVD present. No tracheal deviation present. No thyromegaly present.  Cardiovascular: Normal rate, regular rhythm, normal heart sounds and intact distal pulses.   No murmur heard. Pulmonary/Chest: Effort normal and breath sounds normal. She has no wheezes. She exhibits no tenderness.  Abdominal: Soft. Bowel sounds are normal.  Musculoskeletal: Normal range of motion. She exhibits no edema and no tenderness.  Lymphadenopathy:    She has no cervical adenopathy.  Neurological: She is alert and oriented to person, place, and time. She has normal reflexes. No cranial nerve deficit.  Skin: Skin is warm and dry. She is not diaphoretic.  Psychiatric: She has a normal mood and affect. Her behavior is normal.          Assessment & Plan:  We will check a sedimentation rate to monitor her history of polymyalgia rheumatica we discussed her presyncopal episodes and the possibility that these are related either to an early dysrhythmia which may turn out to be atrial fibrillation or related to hypoglycemia at this point she will monitor these she had an echocardiogram that was completely normal we will consider Holter monitor if the symptoms persist

## 2010-07-15 NOTE — Assessment & Plan Note (Signed)
History of polymyalgia rheumatica a sedimentation rate will be obtained

## 2010-08-07 NOTE — Op Note (Signed)
   NAME:  Lori [name], Lori [name]                      ACCOUNT NO.:  [id number]   MEDICAL RECORD NO.:  [id number]                   PATIENT TYPE:  AMB   LOCATION:  [facility]                                  FACILITY:  [facility]   PHYSICIAN:  Michelle L. Vincente Poli, M.D.            DATE OF BIRTH:  1940-10-28   DATE OF PROCEDURE:  07/20/2002  DATE OF DISCHARGE:                                 OPERATIVE REPORT   PREOPERATIVE DIAGNOSIS:  Postmenopausal bleeding.   POSTOPERATIVE DIAGNOSIS:  Postmenopausal bleeding.   PROCEDURE:  Dilatation and curettage and diagnostic hysteroscopy.   SURGEON:  Michelle L. Vincente Poli, M.D.   ANESTHESIA:  General.   ESTIMATED BLOOD LOSS:  None.   DESCRIPTION OF PROCEDURE:  The patient was taken to the operating room.  She  was intubated and placed in the lithotomy position. The vagina and vulva  were prepped and draped in the usual sterile fashion.  An in-and-out  catheter was used to empty the bladder.  A speculum was inserted into the  vagina.  The cervix was grasped with the tenaculum and the uterus was  sounded to 6 cm.  The cervical internal os was gently dilated.  A diagnostic  hysteroscope was inserted into the uterus and the entire endometrium was  noted to be atrophic.  There was no irregularity of the walls and no  endometrial polyp or fibroid seen.  The hysteroscope was removed and a sharp  curettage was performed, and all tissue was sent to pathology.  There was no  vaginal bleeding.  At the end of the procedure all instruments were removed  from the vagina.  All sponge, lap, and instrument counts were correct x2.  The patient tolerated the procedure well and went to the recovery room in  stable condition.                                               Michelle L. Vincente Poli, M.D.    Florestine Avers  D:  07/20/2002  T:  07/20/2002  Job:  [id number]

## 2010-10-07 ENCOUNTER — Encounter: Payer: Self-pay | Admitting: Internal Medicine

## 2010-10-07 ENCOUNTER — Ambulatory Visit (INDEPENDENT_AMBULATORY_CARE_PROVIDER_SITE_OTHER): Payer: Medicare Other | Admitting: Internal Medicine

## 2010-10-07 VITALS — BP 120/70 | HR 72 | Temp 98.2°F | Resp 14 | Ht 66.0 in | Wt 129.0 lb

## 2010-10-07 DIAGNOSIS — R42 Dizziness and giddiness: Secondary | ICD-10-CM

## 2010-10-07 MED ORDER — MECLIZINE HCL 25 MG PO TABS: 25.0000 mg | ORAL_TABLET | Freq: Three times a day (TID) | ORAL | Status: DC | PRN

## 2010-10-07 NOTE — Progress Notes (Signed)
Subjective:    Patient ID: Lori [name], female    DOB: 18-Jun-1940, 70 y.o.   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  She presents after having worked in the yard yesterday and awoke this morning dizzy she drinks and Gatorade this morning and feels significantly improved she no longer has any of her meclizine.  Prior to getting her refill thought prudent to give her physical evaluation her blood pressure was stable denies any chest pain any palpitations any visual disturbances at this time  Review of Systems  Constitutional: Negative for activity change, appetite change and fatigue.  HENT: Negative for ear pain, congestion, neck pain, postnasal drip and sinus pressure.   Eyes: Negative for redness and visual disturbance.  Respiratory: Negative for cough, shortness of breath and wheezing.   Gastrointestinal: Negative for abdominal pain and abdominal distention.  Genitourinary: Negative for dysuria, frequency and menstrual problem.  Musculoskeletal: Negative for myalgias, joint swelling and arthralgias.  Skin: Negative for rash and wound.  Neurological: Positive for dizziness. Negative for weakness and headaches.  Hematological: Negative for adenopathy. Does not bruise/bleed easily.  Psychiatric/Behavioral: Negative for sleep disturbance and decreased concentration.   Past Medical History  Diagnosis Date  . Osteopenia   . Vasculitis   . PMR (polymyalgia rheumatica)   . Varicose veins with inflammation   . Allergy   . Low back pain   . Headache    Past Surgical History  Procedure Date  . Dilation and curettage of uterus   . Cesarean section   . Tonsillectomy   . Popliteal synovial cyst excision   . Arthroscopic knee     reports that she has never smoked. She does not have any smokeless tobacco history on file. She reports that she does not drink alcohol or use illicit drugs. family  history includes Multiple myeloma in her father. Allergies  Allergen Reactions  . Hydrocodone-Acetaminophen   . Neomycin   . Penicillins     REACTION: Arm swelling  . Pneumococcal Vaccine Polyvalent         Objective:   Physical Exam  Nursing note reviewed. Constitutional: She is oriented to person, place, and time. She appears well-developed and well-nourished. No distress.  HENT:  Head: Normocephalic and atraumatic.  Right Ear: External ear normal.  Left Ear: External ear normal.  Nose: Nose normal.  Mouth/Throat: Oropharynx is clear and moist.  Eyes: Conjunctivae and EOM are normal. Pupils are equal, round, and reactive to light.  Neck: Normal range of motion. Neck supple. No JVD present. No tracheal deviation present. No thyromegaly present.  Cardiovascular: Normal rate, regular rhythm, normal heart sounds and intact distal pulses.   No murmur heard. Pulmonary/Chest: Effort normal and breath sounds normal. She has no wheezes. She exhibits no tenderness.  Abdominal: Soft. Bowel sounds are normal.  Musculoskeletal: Normal range of motion. She exhibits no edema and no tenderness.  Lymphadenopathy:    She has no cervical adenopathy.  Neurological: She is alert and oriented to person, place, and time. She has normal reflexes. No cranial nerve deficit.  Skin: Skin is warm and dry. She is not diaphoretic.  Psychiatric: She has a normal mood and affect. Her behavior is normal.          Assessment & Plan:  Labyrinthine dysfunction.  Again we recommend hydration the use of Antivert 25 mg 3 times a day when necessary and if it should be it  not responsive to this medication or should be associated with chest pain or palpitations she'll notify us immediately

## 2010-10-20 ENCOUNTER — Other Ambulatory Visit: Payer: Medicare Other

## 2010-10-20 ENCOUNTER — Ambulatory Visit (INDEPENDENT_AMBULATORY_CARE_PROVIDER_SITE_OTHER): Payer: Medicare Other | Admitting: Internal Medicine

## 2010-10-20 ENCOUNTER — Encounter: Payer: Self-pay | Admitting: Internal Medicine

## 2010-10-20 DIAGNOSIS — M899 Disorder of bone, unspecified: Secondary | ICD-10-CM

## 2010-10-20 DIAGNOSIS — G43909 Migraine, unspecified, not intractable, without status migrainosus: Secondary | ICD-10-CM

## 2010-10-20 DIAGNOSIS — M545 Low back pain, unspecified: Secondary | ICD-10-CM

## 2010-10-20 DIAGNOSIS — N390 Urinary tract infection, site not specified: Secondary | ICD-10-CM

## 2010-10-20 DIAGNOSIS — M353 Polymyalgia rheumatica: Secondary | ICD-10-CM

## 2010-10-20 DIAGNOSIS — R51 Headache: Secondary | ICD-10-CM

## 2010-10-20 DIAGNOSIS — E785 Hyperlipidemia, unspecified: Secondary | ICD-10-CM

## 2010-10-20 DIAGNOSIS — Z0000 Encounter for general adult medical examination without abnormal findings: Secondary | ICD-10-CM

## 2010-10-20 DIAGNOSIS — M810 Age-related osteoporosis without current pathological fracture: Secondary | ICD-10-CM

## 2010-10-20 DIAGNOSIS — T887XXA Unspecified adverse effect of drug or medicament, initial encounter: Secondary | ICD-10-CM

## 2010-10-20 DIAGNOSIS — M949 Disorder of cartilage, unspecified: Secondary | ICD-10-CM

## 2010-10-20 LAB — CBC WITH DIFFERENTIAL/PLATELET
Basophils Relative: 0.4 % (ref 0.0–3.0)
Eosinophils Relative: 3.7 % (ref 0.0–5.0)
Lymphocytes Relative: 42.6 % (ref 12.0–46.0)
Monocytes Relative: 12.4 % — ABNORMAL HIGH (ref 3.0–12.0)
Neutrophils Relative %: 40.9 % — ABNORMAL LOW (ref 43.0–77.0)
RBC: 4.26 Mil/uL (ref 3.87–5.11)
WBC: 6.5 10*3/uL (ref 4.5–10.5)

## 2010-10-20 LAB — POCT URINALYSIS DIPSTICK
Bilirubin, UA: NEGATIVE
Ketones, UA: NEGATIVE
Leukocytes, UA: NEGATIVE
Protein, UA: NEGATIVE

## 2010-10-20 LAB — HEPATIC FUNCTION PANEL
AST: 27 U/L (ref 0–37)
Albumin: 4.7 g/dL (ref 3.5–5.2)
Alkaline Phosphatase: 52 U/L (ref 39–117)
Total Bilirubin: 0.4 mg/dL (ref 0.3–1.2)

## 2010-10-20 LAB — BASIC METABOLIC PANEL
BUN: 19 mg/dL (ref 6–23)
CO2: 26 mEq/L (ref 19–32)
Calcium: 9.4 mg/dL (ref 8.4–10.5)
Creatinine, Ser: 0.6 mg/dL (ref 0.4–1.2)
Glucose, Bld: 74 mg/dL (ref 70–99)

## 2010-10-20 LAB — LIPID PANEL
LDL Cholesterol: 93 mg/dL (ref 0–99)
VLDL: 12.4 mg/dL (ref 0.0–40.0)

## 2010-10-20 NOTE — Progress Notes (Signed)
Subjective:    Lori [name] is a 70 y.o. female who presents for Medicare Annual/Subsequent preventive examination. Due pap in 2013 mamogram due Reviewed lipid hx and need for screening Reviewed exercise discussion of HA hx and pattern -- stable   Preventive Screening-Counseling & Management  Tobacco History  Smoking status  . Never Smoker   Smokeless tobacco  . Not on file     Problems Prior to Visit 1.   Current Problems (verified) Patient Active Problem List  Diagnoses  . STRESS REACTION, ACUTE, WITH PSYCHOMOTOR DISTURBANCE  . MENIERE'S DISEASE  . RAYNAUD'S DISEASE  . UNSPECIFIED ARTERITIS  . VARICOSE VEINS LOWER EXTREMITIES W/INFLAMMATION  . ALLERGIC RHINITIS  . HIATAL HERNIA WITH REFLUX  . NEPHROLITHIASIS  . URINARY TRACT INFECTION, CHRONIC  . UNSPECIFIED MENOPAUSAL&POSTMENOPAUSAL DISORDER  . ACTINIC KERATOSIS, FOREHEAD, LEFT  . DEGENERATIVE JOINT DISEASE, KNEE  . LOW BACK PAIN  . POLYMYALGIA RHEUMATICA  . OSTEOPENIA  . HEADACHE  . Syncopal episodes    Medications Prior to Visit Current Outpatient Prescriptions on File Prior to Visit  Medication Sig Dispense Refill  . aspirin 81 MG tablet Take 81 mg by mouth daily.        Marland Kitchen augmented betamethasone dipropionate (DIPROLENE-AF) 0.05 % cream Apply topically daily.        . Calcium Carbonate-Vit D-Min (CALCIUM 1200 PO) Take by mouth daily.        . clidinium-chlordiazePOXIDE (LIBRAX) 2.5-5 MG per capsule Take 1 capsule by mouth 3 (three) times daily as needed.        . fenofibrate 160 MG tablet Take 160 mg by mouth daily.        . fluticasone (FLONASE) 50 MCG/ACT nasal spray 2 sprays by Nasal route daily.        . frovatriptan (FROVA) 2.5 MG tablet Take 2.5 mg by mouth as needed. If recurs, may repeat after 2 hours. Max of 3 tabs in 24 hours.       . meclizine (ANTIVERT) 25 MG tablet Take 1 tablet (25 mg total) by mouth 3 (three) times daily as needed.  30 tablet  3  . metoprolol (LOPRESSOR) 50 MG  tablet Take 25 mg by mouth 2 (two) times daily.       . Omega-3 Fatty Acids (SEA-OMEGA 30) 1200 MG CAPS Take by mouth daily.        Marland Kitchen omeprazole (PRILOSEC) 40 MG capsule TAKE ONE CAPSULE BY MOUTH EVERY DAY  30 capsule  11    Current Medications (verified) Current Outpatient Prescriptions  Medication Sig Dispense Refill  . aspirin 81 MG tablet Take 81 mg by mouth daily.        Marland Kitchen augmented betamethasone dipropionate (DIPROLENE-AF) 0.05 % cream Apply topically daily.        . Calcium Carbonate-Vit D-Min (CALCIUM 1200 PO) Take by mouth daily.        . clidinium-chlordiazePOXIDE (LIBRAX) 2.5-5 MG per capsule Take 1 capsule by mouth 3 (three) times daily as needed.        . fenofibrate 160 MG tablet Take 160 mg by mouth daily.        . fluticasone (FLONASE) 50 MCG/ACT nasal spray 2 sprays by Nasal route daily.        . frovatriptan (FROVA) 2.5 MG tablet Take 2.5 mg by mouth as needed. If recurs, may repeat after 2 hours. Max of 3 tabs in 24 hours.       . meclizine (ANTIVERT) 25 MG tablet Take 1 tablet (  25 mg total) by mouth 3 (three) times daily as needed.  30 tablet  3  . metoprolol (LOPRESSOR) 50 MG tablet Take 25 mg by mouth 2 (two) times daily.       . Omega-3 Fatty Acids (SEA-OMEGA 30) 1200 MG CAPS Take by mouth daily.        Marland Kitchen omeprazole (PRILOSEC) 40 MG capsule TAKE ONE CAPSULE BY MOUTH EVERY DAY  30 capsule  11     Allergies (verified) Hydrocodone-acetaminophen; Neomycin; Penicillins; and Pneumococcal vaccine polyvalent   PAST HISTORY  Family History Family History  Problem Relation Age of Onset  . Multiple myeloma Father     Social History History  Substance Use Topics  . Smoking status: Never Smoker   . Smokeless tobacco: Not on file  . Alcohol Use: No     Are there smokers in your home (other than you)? No  Risk Factors Current exercise habits: The patient does not participate in regular exercise at present.  Dietary issues discussed: calcium in diet   Cardiac risk  factors: advanced age (older than 63 for men, 80 for women) and sedentary lifestyle.  Depression Screen (Note: if answer to either of the following is "Yes", a more complete depression screening is indicated)   Over the past two weeks, have you felt down, depressed or hopeless? No  Over the past two weeks, have you felt little interest or pleasure in doing things? No  Have you lost interest or pleasure in daily life? No  Do you often feel hopeless? No  Do you cry easily over simple problems? No  Activities of Daily Living In your present state of health, do you have any difficulty performing the following activities?:  Driving? No Managing money?  No Feeding yourself? No Getting from bed to chair? No Climbing a flight of stairs? No Preparing food and eating?: No Bathing or showering? No Getting dressed: No Getting to the toilet? No Using the toilet:No Moving around from place to place: No In the past year have you fallen or had a near fall?:No   Are you sexually active?  Yes  Do you have more than one partner?  No  Hearing Difficulties: No Do you often ask people to speak up or repeat themselves? No Do you experience ringing or noises in your ears? No Do you have difficulty understanding soft or whispered voices? No   Do you feel that you have a problem with memory? No  Do you often misplace items? No  Do you feel safe at home?  No  Cognitive Testing  Alert? Yes  Normal Appearance?Yes  Oriented to person? Yes  Place? Yes   Time? Yes  Recall of three objects?  Yes  Can perform simple calculations? Yes  Displays appropriate judgment?Yes  Can read the correct time from a watch face?Yes   Advanced Directives have been discussed with the patient? Yes  List the Names of Other Physician/Practitioners you currently use: 1.    Indicate any recent Medical Services you may have received from other than [facility] providers in the past year (date may be approximate).  Immunization  History  Administered Date(s) Administered  . Influenza Whole 01/03/2007, 12/08/2007, 12/17/2008, 01/20/2010  . Pneumococcal Polysaccharide 03/19/2006  . Td 03/23/2003    Screening Tests Health Maintenance  Topic Date Due  . Zostavax  03/10/2002  . Pneumococcal Polysaccharide Vaccine Age 32 And Over  03/11/2007  . Influenza Vaccine  12/21/2010  . Mammogram  03/19/2011  . Tetanus/tdap  03/22/2013  . Colonoscopy  11/22/2017    All answers were reviewed with the patient and necessary referrals were made:  Carrie Mew   10/20/2010   History reviewed: allergies, current medications, past family history, past medical history, past social history, past surgical history and problem list  Review of Systems Ears, nose, mouth, throat, and face: positive for hearing loss and nasal congestion Musculoskeletal:positive for arthralgias, myalgias and stiff joints    Objective:     Vision by Snellen chart: right eye:20/20, left eye:20/20  Body mass index is 20.98 kg/(m^2). BP 130/80  Pulse 60  Temp 98.2 F (36.8 C)  Resp 14  Ht 5\' 6"  (1.676 m)  Wt 130 lb (58.968 kg)  BMI 20.98 kg/m2  BP 130/80  Pulse 60  Temp 98.2 F (36.8 C)  Resp 14  Ht 5\' 6"  (1.676 m)  Wt 130 lb (58.968 kg)  BMI 20.98 kg/m2  General Appearance:    Alert, cooperative, no distress, appears stated age  Head:    Normocephalic, without obvious abnormality, atraumatic  Eyes:    PERRL, conjunctiva/corneas clear, EOM's intact, fundi    benign, both eyes  Ears:    Normal TM's and external ear canals, both ears  Nose:   Nares normal, septum midline, mucosa normal, no drainage    or sinus tenderness  Throat:   Lips, mucosa, and tongue normal; teeth and gums normal  Neck:   Supple, symmetrical, trachea midline, no adenopathy;    thyroid:  no enlargement/tenderness/nodules; no carotid   bruit or JVD  Back:     Symmetric, no curvature, ROM normal, no CVA tenderness  Lungs:     Clear to auscultation bilaterally,  respirations unlabored  Chest Wall:    No tenderness or deformity   Heart:    Regular rate and rhythm, S1 and S2 normal, no murmur, rub   or gallop  Breast Exam:    No tenderness, masses, or nipple abnormality  Abdomen:     Soft, non-tender, bowel sounds active all four quadrants,    no masses, no organomegaly  Genitalia:    Normal female without lesion, discharge or tenderness  Rectal:    Normal tone, normal prostate, no masses or tenderness;   guaiac negative stool  Extremities:   Extremities normal, atraumatic, no cyanosis or edema  Pulses:   2+ and symmetric all extremities  Skin:   Skin color, texture, turgor normal, no rashes or lesions  Lymph nodes:   Cervical, supraclavicular, and axillary nodes normal  Neurologic:   CNII-XII intact, normal strength, sensation and reflexes    throughout       Assessment:      This is a routine physical examination for this healthy  Female. Reviewed all health maintenance protocols including mammography colonoscopy bone density and reviewed appropriate screening labs. Her immunization history was reviewed as well as her current medications and allergies refills of her chronic medications were given and the plan for yearly health maintenance was discussed all orders and referrals were made as appropriate.   Hx of PMR  No current MSK pain discussion of osteopenia Order blood work  Plan:    Discussion of the shingles vacine Screening for lipids Known osteopenia with need to monitor calcium and vit D During the course of the visit the patient was educated and counseled about appropriate screening and preventive services including:    Influenza vaccine  Advanced directives: has an advanced directive - a copy HAS NOT been provided.  shingles vacine  Diet review for nutrition referral? Yes ____  Not Indicated ___x_   Patient Instructions (the written plan) was given to the patient.  Medicare Attestation I have personally reviewed: The  patient's medical and social history Their use of alcohol, tobacco or illicit drugs Their current medications and supplements The patient's functional ability including ADLs,fall risks, home safety risks, cognitive, and hearing and visual impairment Diet and physical activities Evidence for depression or mood disorders  The patient's weight, height, BMI, and visual acuity have been recorded in the chart.  I have made referrals, counseling, and provided education to the patient based on review of the above and I have provided the patient with a written personalized care plan for preventive services.     Carrie Mew   10/20/2010

## 2010-10-20 NOTE — Assessment & Plan Note (Signed)
Reviewed  The hx stable pattern No change in medications

## 2010-10-20 NOTE — Assessment & Plan Note (Signed)
Possible prednisone? Use of PPI? Due bone density

## 2010-10-21 LAB — VITAMIN D 25 HYDROXY (VIT D DEFICIENCY, FRACTURES): Vit D, 25-Hydroxy: 26 ng/mL — ABNORMAL LOW (ref 30–89)

## 2010-10-23 ENCOUNTER — Ambulatory Visit (INDEPENDENT_AMBULATORY_CARE_PROVIDER_SITE_OTHER)
Admission: RE | Admit: 2010-10-23 | Discharge: 2010-10-23 | Disposition: A | Discharge status: Home / Self Care | Payer: Medicare Other | Source: Ambulatory Visit

## 2010-10-23 DIAGNOSIS — M899 Disorder of bone, unspecified: Secondary | ICD-10-CM

## 2010-10-23 DIAGNOSIS — M949 Disorder of cartilage, unspecified: Secondary | ICD-10-CM

## 2010-12-01 ENCOUNTER — Ambulatory Visit (INDEPENDENT_AMBULATORY_CARE_PROVIDER_SITE_OTHER): Payer: Medicare Other | Admitting: Family Medicine

## 2010-12-01 ENCOUNTER — Encounter: Payer: Self-pay | Admitting: Family Medicine

## 2010-12-01 DIAGNOSIS — IMO0001 Reserved for inherently not codable concepts without codable children: Secondary | ICD-10-CM

## 2010-12-01 DIAGNOSIS — M791 Myalgia, unspecified site: Secondary | ICD-10-CM

## 2010-12-01 DIAGNOSIS — M353 Polymyalgia rheumatica: Secondary | ICD-10-CM

## 2010-12-01 DIAGNOSIS — M25569 Pain in unspecified knee: Secondary | ICD-10-CM

## 2010-12-01 DIAGNOSIS — R030 Elevated blood-pressure reading, without diagnosis of hypertension: Secondary | ICD-10-CM

## 2010-12-01 MED ORDER — MELOXICAM 15 MG PO TABS: 15.0000 mg | ORAL_TABLET | Freq: Every day | ORAL | Status: DC

## 2010-12-01 NOTE — Progress Notes (Signed)
Subjective:    Patient ID: Lori [name], female    DOB: 06-20-1940, 70 y.o.   MRN: [id number]  HPI Patient seen with multiple issues. Right knee pain for 4 weeks duration. First noted after doing some shoveling in her garden. No specific injury. No effusion. No warmth or erythema. Pain is off and on. Somewhat poorly localized center of knee. No giving way or locking. Occasional Advil without much relief.  Patient complains of diffuse body aches. History of similar symptoms previously. Past history of polymyalgia rheumatica. She has been off prednisone for quite some time. Recent TSH normal. Low vitamin D of 26. Recent sedimentation rate stable. She has more myalgias than arthralgias. Does not take any statin. Patient does take fibric acid derivative.    History of hypertension. Elevated blood pressure today which patient thinks is related to increased pain. Denies any headaches. No dizziness. No chest pains. Takes Lopressor and compliant with therapy.  Past Medical History  Diagnosis Date  . Osteopenia   . Vasculitis   . PMR (polymyalgia rheumatica)   . Varicose veins with inflammation   . Allergy   . Low back pain   . Headache    Past Surgical History  Procedure Date  . Dilation and curettage of uterus   . Cesarean section   . Tonsillectomy   . Popliteal synovial cyst excision   . Arthroscopic knee     reports that she has never smoked. She does not have any smokeless tobacco history on file. She reports that she does not drink alcohol or use illicit drugs. family history includes Multiple myeloma in her father. Allergies  Allergen Reactions  . Hydrocodone-Acetaminophen   . Neomycin   . Penicillins     REACTION: Arm swelling  . Pneumococcal Vaccine Polyvalent      Review of Systems  Constitutional: Positive for fatigue. Negative for fever and chills.  Respiratory: Negative for cough and shortness of breath.   Cardiovascular: Negative for chest pain, palpitations  and leg swelling.  Gastrointestinal: Negative for abdominal pain.  Musculoskeletal: Positive for myalgias. Negative for joint swelling.  Neurological: Negative for dizziness, syncope, weakness and headaches.  Hematological: Negative for adenopathy. Does not bruise/bleed easily.       Objective:   Physical Exam  Constitutional: She is oriented to person, place, and time. She appears well-developed and well-nourished. No distress.  HENT:  Mouth/Throat: Oropharynx is clear and moist.  Neck: Neck supple. No thyromegaly present.  Cardiovascular: Normal rate, regular rhythm and normal heart sounds.   Pulmonary/Chest: Effort normal and breath sounds normal. No respiratory distress. She has no wheezes. She has no rales.  Musculoskeletal: She exhibits no edema.       Right knee reveals full range of motion. No warmth or erythema. No ecchymosis. No medial or lateral joint line tenderness. Ligament testing is normal.  Lymphadenopathy:    She has no cervical adenopathy.  Neurological: She is alert and oriented to person, place, and time.       No focal weakness  Skin: No rash noted.  Psychiatric: She has a normal mood and affect. Her behavior is normal.          Assessment & Plan:  #1 right knee pain. Question meniscal irritation. No effusion or other evidence for significant tear. Short term meloxicam 15 mg once daily. Will need x-rays and further evaluation if not improving over the next month. Suggested some light exercises such as stationary cycling on low resistance. She has poor quadriceps  tone in general bilaterally #2 myalgias. Past history polymyalgia rheumatica. Recheck sedimentation rate #3 low vitamin D. Increase over-the-counter vitamin D about 1000 international units daily #4 elevated blood pressure. Continue to monitor. Followup with primary in one month

## 2010-12-01 NOTE — Patient Instructions (Signed)
Consider additional Vit D 1,000 IU daily. Consider over the counter Allegra 180 mg once daily.

## 2010-12-02 ENCOUNTER — Other Ambulatory Visit: Payer: Self-pay | Admitting: Internal Medicine

## 2010-12-02 NOTE — Progress Notes (Signed)
Quick Note:  Pt informed ______ 

## 2011-02-10 ENCOUNTER — Other Ambulatory Visit: Payer: Self-pay | Admitting: Internal Medicine

## 2011-02-19 ENCOUNTER — Ambulatory Visit (INDEPENDENT_AMBULATORY_CARE_PROVIDER_SITE_OTHER): Payer: Medicare Other | Admitting: Internal Medicine

## 2011-02-19 ENCOUNTER — Encounter: Payer: Self-pay | Admitting: Internal Medicine

## 2011-02-19 ENCOUNTER — Ambulatory Visit: Payer: Medicare Other | Admitting: Internal Medicine

## 2011-02-19 VITALS — BP 140/64 | HR 72 | Temp 98.2°F | Resp 16 | Ht 66.0 in | Wt 132.0 lb

## 2011-02-19 DIAGNOSIS — M25569 Pain in unspecified knee: Secondary | ICD-10-CM

## 2011-02-19 DIAGNOSIS — I10 Essential (primary) hypertension: Secondary | ICD-10-CM

## 2011-02-19 DIAGNOSIS — M25561 Pain in right knee: Secondary | ICD-10-CM

## 2011-02-19 DIAGNOSIS — E785 Hyperlipidemia, unspecified: Secondary | ICD-10-CM

## 2011-02-19 MED ORDER — MELOXICAM 15 MG PO TABS: 15.0000 mg | ORAL_TABLET | Freq: Every day | ORAL | Status: DC

## 2011-02-19 NOTE — Progress Notes (Signed)
Subjective:    Patient ID: Lori [name], female    DOB: Aug 22, 1940, 70 y.o.   MRN: [id number]  HPI The pt has persistent pain in both knees after acute injury... She did not take the mobic. No report swelling but still has pain after sitting. Has not been on NSAID No chest pain and SOB    Review of Systems  Constitutional: Negative for activity change, appetite change and fatigue.  HENT: Negative for ear pain, congestion, neck pain, postnasal drip and sinus pressure.   Eyes: Negative for redness and visual disturbance.  Respiratory: Negative for cough, shortness of breath and wheezing.   Gastrointestinal: Negative for abdominal pain and abdominal distention.  Genitourinary: Negative for dysuria, frequency and menstrual problem.  Musculoskeletal: Positive for myalgias, joint swelling and arthralgias.  Skin: Negative for rash and wound.  Neurological: Negative for dizziness, weakness and headaches.  Hematological: Negative for adenopathy. Does not bruise/bleed easily.  Psychiatric/Behavioral: Negative for sleep disturbance and decreased concentration.   Past Medical History  Diagnosis Date  . Osteopenia   . Vasculitis   . PMR (polymyalgia rheumatica)   . Varicose veins with inflammation   . Allergy   . Low back pain   . Headache     History   Social History  . Marital Status: Married    Spouse Name: N/A    Number of Children: N/A  . Years of Education: N/A   Occupational History  . retired    Social History Main Topics  . Smoking status: Never Smoker   . Smokeless tobacco: Not on file  . Alcohol Use: No  . Drug Use: No  . Sexually Active: Yes   Other Topics Concern  . Not on file   Social History Narrative  . No narrative on file    Past Surgical History  Procedure Date  . Dilation and curettage of uterus   . Cesarean section   . Tonsillectomy   . Popliteal synovial cyst excision   . Arthroscopic knee     Family History  Problem Relation Age  of Onset  . Multiple myeloma Father     Allergies  Allergen Reactions  . Hydrocodone-Acetaminophen   . Neomycin   . Penicillins     REACTION: Arm swelling  . Pneumococcal Vaccine Polyvalent     Current Outpatient Prescriptions on File Prior to Visit  Medication Sig Dispense Refill  . aspirin 81 MG tablet Take 81 mg by mouth daily.        Marland Kitchen augmented betamethasone dipropionate (DIPROLENE-AF) 0.05 % cream Apply topically daily.        . Calcium Carbonate-Vit D-Min (CALCIUM 1200 PO) Take by mouth daily.        . clidinium-chlordiazePOXIDE (LIBRAX) 2.5-5 MG per capsule Take 1 capsule by mouth 3 (three) times daily as needed.        . fenofibrate 160 MG tablet TAKE ONE TABLET BY MOUTH EVERY DAY  90 tablet  3  . fluticasone (FLONASE) 50 MCG/ACT nasal spray USE TWO SPRAY EACH NOSTRIL EVERY DAY  16 g  10  . frovatriptan (FROVA) 2.5 MG tablet Take 2.5 mg by mouth as needed. If recurs, may repeat after 2 hours. Max of 3 tabs in 24 hours.       . meclizine (ANTIVERT) 25 MG tablet Take 1 tablet (25 mg total) by mouth 3 (three) times daily as needed.  30 tablet  3  . meloxicam (MOBIC) 15 MG tablet Take 1 tablet (15 mg total)  by mouth daily.  30 tablet  1  . metoprolol (LOPRESSOR) 50 MG tablet Take 25 mg by mouth 2 (two) times daily.       . Omega-3 Fatty Acids (SEA-OMEGA 30) 1200 MG CAPS Take by mouth daily.        Marland Kitchen omeprazole (PRILOSEC) 40 MG capsule TAKE ONE CAPSULE BY MOUTH EVERY DAY  30 capsule  11  . DISCONTD: metoprolol (LOPRESSOR) 50 MG tablet Take 1 tablet (50 mg total) by mouth daily.  30 tablet  11    BP 144/76  Pulse 72  Temp 98.2 F (36.8 C)  Resp 16  Ht 5\' 6"  (1.676 m)  Wt 132 lb (59.875 kg)  BMI 21.31 kg/m2       Objective:   Physical Exam  Nursing note and vitals reviewed. Constitutional: She is oriented to person, place, and time. She appears well-developed and well-nourished. No distress.  HENT:  Head: Normocephalic and atraumatic.  Right Ear: External ear normal.   Left Ear: External ear normal.  Nose: Nose normal.  Mouth/Throat: Oropharynx is clear and moist.  Eyes: Conjunctivae and EOM are normal. Pupils are equal, round, and reactive to light.  Neck: Normal range of motion. Neck supple. No JVD present. No tracheal deviation present. No thyromegaly present.  Cardiovascular: Normal rate, regular rhythm, normal heart sounds and intact distal pulses.   No murmur heard. Pulmonary/Chest: Effort normal and breath sounds normal. She has no wheezes. She exhibits no tenderness.  Abdominal: Soft. Bowel sounds are normal.  Musculoskeletal: Normal range of motion. She exhibits edema and tenderness.  Lymphadenopathy:    She has no cervical adenopathy.  Neurological: She is alert and oriented to person, place, and time. She has normal reflexes. No cranial nerve deficit.  Skin: Skin is warm and dry. She is not diaphoretic.  Psychiatric: She has a normal mood and affect. Her behavior is normal.          Assessment & Plan:  Knee pain:   Would not use the osteobioflex secondary to lipid issues Would try the "prn"  Mobic. Needs a pap in feb Blood pressure monitoring discussed gerd stable

## 2011-02-19 NOTE — Patient Instructions (Signed)
The patient is instructed to continue all medications as prescribed. Schedule followup with check out clerk upon leaving the clinic  

## 2011-02-22 ENCOUNTER — Encounter: Payer: Self-pay | Admitting: Internal Medicine

## 2011-04-30 ENCOUNTER — Other Ambulatory Visit (HOSPITAL_COMMUNITY)
Admission: RE | Admit: 2011-04-30 | Discharge: 2011-04-30 | Disposition: A | Discharge status: Home / Self Care | Payer: Medicare Other | Source: Ambulatory Visit | Attending: Internal Medicine | Admitting: Internal Medicine

## 2011-04-30 ENCOUNTER — Encounter: Payer: Self-pay | Admitting: Internal Medicine

## 2011-04-30 ENCOUNTER — Ambulatory Visit (INDEPENDENT_AMBULATORY_CARE_PROVIDER_SITE_OTHER): Payer: Medicare Other | Admitting: Internal Medicine

## 2011-04-30 VITALS — BP 130/80 | HR 76 | Temp 98.3°F | Resp 16 | Ht 66.0 in | Wt 130.0 lb

## 2011-04-30 DIAGNOSIS — E162 Hypoglycemia, unspecified: Secondary | ICD-10-CM

## 2011-04-30 DIAGNOSIS — Z124 Encounter for screening for malignant neoplasm of cervix: Secondary | ICD-10-CM

## 2011-04-30 DIAGNOSIS — Z01419 Encounter for gynecological examination (general) (routine) without abnormal findings: Secondary | ICD-10-CM | POA: Insufficient documentation

## 2011-04-30 NOTE — Progress Notes (Signed)
Subjective:    Patient ID: Lori [name], female    DOB: March 23, 1940, 71 y.o.   MRN: [id number]  HPI For pap Has questions about  Medications and reports rare hypoglycemia episodes.    Review of Systems  Constitutional: Negative for activity change, appetite change and fatigue.  HENT: Negative for ear pain, congestion, neck pain, postnasal drip and sinus pressure.   Eyes: Negative for redness and visual disturbance.  Respiratory: Negative for cough, shortness of breath and wheezing.   Gastrointestinal: Negative for abdominal pain and abdominal distention.  Genitourinary: Negative for dysuria, frequency and menstrual problem.  Musculoskeletal: Negative for myalgias, joint swelling and arthralgias.  Skin: Negative for rash and wound.  Neurological: Negative for dizziness, weakness and headaches.  Hematological: Negative for adenopathy. Does not bruise/bleed easily.  Psychiatric/Behavioral: Negative for sleep disturbance and decreased concentration.   Past Medical History  Diagnosis Date  . Osteopenia   . Vasculitis   . PMR (polymyalgia rheumatica)   . Varicose veins with inflammation   . Allergy   . Low back pain   . Headache     History   Social History  . Marital Status: Married    Spouse Name: N/A    Number of Children: N/A  . Years of Education: N/A   Occupational History  . retired    Social History Main Topics  . Smoking status: Never Smoker   . Smokeless tobacco: Not on file  . Alcohol Use: No  . Drug Use: No  . Sexually Active: Yes   Other Topics Concern  . Not on file   Social History Narrative  . No narrative on file    Past Surgical History  Procedure Date  . Dilation and curettage of uterus   . Cesarean section   . Tonsillectomy   . Popliteal synovial cyst excision   . Arthroscopic knee     Family History  Problem Relation Age of Onset  . Multiple myeloma Father     Allergies  Allergen Reactions  . Hydrocodone-Acetaminophen     . Neomycin   . Penicillins     REACTION: Arm swelling  . Pneumococcal Vaccine Polyvalent     Current Outpatient Prescriptions on File Prior to Visit  Medication Sig Dispense Refill  . aspirin 81 MG tablet Take 81 mg by mouth daily.        Marland Kitchen augmented betamethasone dipropionate (DIPROLENE-AF) 0.05 % cream Apply topically daily.        . Calcium Carbonate-Vit D-Min (CALCIUM 1200 PO) Take by mouth daily.        . clidinium-chlordiazePOXIDE (LIBRAX) 2.5-5 MG per capsule Take 1 capsule by mouth 3 (three) times daily as needed.        . fenofibrate 160 MG tablet TAKE ONE TABLET BY MOUTH EVERY DAY  90 tablet  3  . fluticasone (FLONASE) 50 MCG/ACT nasal spray USE TWO SPRAY EACH NOSTRIL EVERY DAY  16 g  10  . frovatriptan (FROVA) 2.5 MG tablet Take 2.5 mg by mouth as needed. If recurs, may repeat after 2 hours. Max of 3 tabs in 24 hours.       . meclizine (ANTIVERT) 25 MG tablet Take 1 tablet (25 mg total) by mouth 3 (three) times daily as needed.  30 tablet  3  . meloxicam (MOBIC) 15 MG tablet Take 1 tablet (15 mg total) by mouth daily.  30 tablet  2  . metoprolol (LOPRESSOR) 50 MG tablet Take 25 mg by mouth 2 (two) times  daily.       . Omega-3 Fatty Acids (SEA-OMEGA 30) 1200 MG CAPS Take by mouth daily.        Marland Kitchen omeprazole (PRILOSEC) 40 MG capsule TAKE ONE CAPSULE BY MOUTH EVERY DAY  30 capsule  11  . DISCONTD: metoprolol (LOPRESSOR) 50 MG tablet Take 1 tablet (50 mg total) by mouth daily.  30 tablet  11    BP 130/80  Pulse 76  Temp 98.3 F (36.8 C)  Resp 16  Ht 5\' 6"  (1.676 m)  Wt 130 lb (58.968 kg)  BMI 20.98 kg/m2        Objective:   Physical Exam  Nursing note and vitals reviewed. Constitutional: She is oriented to person, place, and time. She appears well-developed and well-nourished. No distress.  HENT:  Head: Normocephalic and atraumatic.  Right Ear: External ear normal.  Left Ear: External ear normal.  Nose: Nose normal.  Mouth/Throat: Oropharynx is clear and moist.   Eyes: Conjunctivae and EOM are normal. Pupils are equal, round, and reactive to light.  Neck: Normal range of motion. Neck supple. No JVD present. No tracheal deviation present. No thyromegaly present.  Cardiovascular: Normal rate, regular rhythm, normal heart sounds and intact distal pulses.   No murmur heard. Pulmonary/Chest: Effort normal and breath sounds normal. She has no wheezes. She exhibits no tenderness.  Abdominal: Soft. Bowel sounds are normal.  Musculoskeletal: Normal range of motion. She exhibits no edema and no tenderness.  Lymphadenopathy:    She has no cervical adenopathy.  Neurological: She is alert and oriented to person, place, and time. She has normal reflexes. No cranial nerve deficit.  Skin: Skin is warm and dry. She is not diaphoretic.  Psychiatric: She has a normal mood and affect. Her behavior is normal.          Assessment & Plan:  Pap smear obtained today cervix has minor erosions but appears normal bimanual examination was normal.  Patient has a history of hypoglycemia and has had some recent episode suggesting hypoglycemia we discussed the appropriate approach to drinking Gatorade or orange used at the first symptoms she prefers. Since she has chronic reflux and the orange juice exacerbates her reflux.  Physical history of chronic recurrent urinary tract infections and repeat a urine today

## 2011-04-30 NOTE — Patient Instructions (Signed)
The patient is instructed to continue all medications as prescribed. Schedule followup with check out clerk upon leaving the clinic  

## 2011-04-30 NOTE — Progress Notes (Signed)
Addended by: Willy Eddy on: 04/30/2011 03:15 PM   Modules accepted: Orders

## 2011-05-17 ENCOUNTER — Encounter: Payer: Self-pay | Admitting: Family Medicine

## 2011-05-17 ENCOUNTER — Ambulatory Visit (INDEPENDENT_AMBULATORY_CARE_PROVIDER_SITE_OTHER): Payer: Medicare Other | Admitting: Family Medicine

## 2011-05-17 VITALS — BP 160/84 | Temp 98.0°F | Wt 132.0 lb

## 2011-05-17 DIAGNOSIS — J029 Acute pharyngitis, unspecified: Secondary | ICD-10-CM

## 2011-05-17 NOTE — Patient Instructions (Signed)

## 2011-05-17 NOTE — Progress Notes (Signed)
  Subjective:    Patient ID: Lori [name], female    DOB: May 06, 1940, 71 y.o.   MRN: [id number]  HPI  Acute visit. One-day history of sore throat. She's had mild earache. Diffuse bodyaches. Mild headache. Denies any cough. Minimal malaise and nasal congestion. Sore throat improved with Advil. Concern for possible strep pharyngitis. Grandson recently who had sore throat^   Review of Systems  Constitutional: Negative for fever and chills.  HENT: Positive for sore throat.   Respiratory: Negative for cough, choking, shortness of breath and wheezing.   Neurological: Positive for headaches.       Objective:   Physical Exam  Constitutional: She appears well-developed and well-nourished.  HENT:  Right Ear: External ear normal.  Left Ear: External ear normal.       Mild posterior pharynx erythema. No exudate  Neck: Neck supple. No thyromegaly present.  Cardiovascular: Normal rate and regular rhythm.   Pulmonary/Chest: Effort normal and breath sounds normal. No respiratory distress. She has no wheezes. She has no rales.  Lymphadenopathy:    She has no cervical adenopathy.          Assessment & Plan: Sore throat. Rapid strep negative. Suspect viral. Treat symptomatically sore throat. Rapid strep negative. Suspect viral. Treat symptomatically. Sore throat.   Sore throat.  Rapid strep negative. Suspect viral. Treat symptomatically

## 2011-05-20 ENCOUNTER — Encounter: Payer: Self-pay | Admitting: Family Medicine

## 2011-05-20 ENCOUNTER — Ambulatory Visit (INDEPENDENT_AMBULATORY_CARE_PROVIDER_SITE_OTHER): Payer: Medicare Other | Admitting: Family Medicine

## 2011-05-20 VITALS — BP 150/80 | Temp 98.2°F

## 2011-05-20 DIAGNOSIS — J209 Acute bronchitis, unspecified: Secondary | ICD-10-CM

## 2011-05-20 MED ORDER — AZITHROMYCIN 250 MG PO TABS: ORAL_TABLET | ORAL | Status: AC

## 2011-05-20 NOTE — Progress Notes (Signed)
  Subjective:    Patient ID: Lori [name], female    DOB: 1940/08/14, 71 y.o.   MRN: [id number]  HPI  Patient seen for followup. Suspected viral syndrome. Now presents with somewhat progressive cough productive of yellow sputum. Cough worse at night. Also significant nasal congestion mild. Sore throat has improved. She's had some mild laryngitis. Still no fever. No dyspnea. Using Flonase and antihistamine without much improvement. Cannot tolerate decongestants.   Review of Systems  Constitutional: Positive for fatigue. Negative for fever and chills.  HENT: Positive for congestion.   Respiratory: Positive for cough. Negative for wheezing.        Objective:   Physical Exam  Constitutional: She appears well-developed and well-nourished.  HENT:  Right Ear: External ear normal.  Left Ear: External ear normal.  Mouth/Throat: Oropharynx is clear and moist.  Neck: Neck supple.  Cardiovascular: Normal rate and regular rhythm.   Pulmonary/Chest: Effort normal and breath sounds normal. No respiratory distress. She has no wheezes. She has no rales.  Lymphadenopathy:    She has no cervical adenopathy.          Assessment & Plan:  Respiratory infection. Suspect this is still viral URI. Written prescriptions for Zithromax to start only if she develops any fever or worsening symptoms. She'll try Vicks VapoRub for congestive symptoms and plenty of fluids.

## 2011-05-20 NOTE — Patient Instructions (Signed)
Continue to drink plenty of fluids and follow up promptly for any fever or worsening symptoms.

## 2011-05-31 ENCOUNTER — Ambulatory Visit (INDEPENDENT_AMBULATORY_CARE_PROVIDER_SITE_OTHER): Payer: Medicare Other | Admitting: Family Medicine

## 2011-05-31 ENCOUNTER — Encounter: Payer: Self-pay | Admitting: Family Medicine

## 2011-05-31 VITALS — BP 150/82 | Temp 98.3°F | Wt 133.0 lb

## 2011-05-31 DIAGNOSIS — R05 Cough: Secondary | ICD-10-CM

## 2011-05-31 MED ORDER — BENZONATATE 200 MG PO CAPS: 200.0000 mg | ORAL_CAPSULE | Freq: Three times a day (TID) | ORAL | Status: DC | PRN

## 2011-05-31 NOTE — Patient Instructions (Signed)
Follow up with primary physician if no better in 1-2 weeks.

## 2011-05-31 NOTE — Progress Notes (Signed)
  Subjective:    Patient ID: Lori [name], female    DOB: 12-29-1940, 71 y.o.   MRN: [id number]  HPI  Patient seen with persistent cough. Refer to last visits. Initially felt to be viral. Still has cough at night. She never filled prescription for Zithromax. She is having sinus congestion maxillary frontal region with some yellowish nasal discharge. Occasional bleeding from nose. No fever. Has continued Flonase intermittently   Review of Systems  Constitutional: Positive for fatigue. Negative for fever and chills.  HENT: Positive for congestion, postnasal drip and sinus pressure.   Respiratory: Positive for cough.        Objective:   Physical Exam  HENT:  Right Ear: External ear normal.  Left Ear: External ear normal.  Mouth/Throat: Oropharynx is clear and moist.  Neck: Neck supple.  Cardiovascular: Normal rate and regular rhythm.   Pulmonary/Chest: Effort normal and breath sounds normal. No respiratory distress. She has no wheezes. She has no rales.  Lymphadenopathy:    She has no cervical adenopathy.          Assessment & Plan:  Recent URI. Still suspect most of this is post viral but given persistence of symptoms go ahead and start Zithromax. Tessalon Perles 200 mg every 8 hours as needed for cough

## 2011-06-03 ENCOUNTER — Ambulatory Visit (INDEPENDENT_AMBULATORY_CARE_PROVIDER_SITE_OTHER): Payer: Medicare Other | Admitting: Internal Medicine

## 2011-06-03 ENCOUNTER — Encounter: Payer: Self-pay | Admitting: Internal Medicine

## 2011-06-03 VITALS — BP 136/80 | HR 72 | Temp 98.2°F | Resp 16 | Ht 66.0 in | Wt 133.0 lb

## 2011-06-03 DIAGNOSIS — R51 Headache: Secondary | ICD-10-CM

## 2011-06-03 DIAGNOSIS — H8109 Meniere's disease, unspecified ear: Secondary | ICD-10-CM

## 2011-06-03 DIAGNOSIS — J309 Allergic rhinitis, unspecified: Secondary | ICD-10-CM

## 2011-06-03 DIAGNOSIS — J329 Chronic sinusitis, unspecified: Secondary | ICD-10-CM

## 2011-06-03 MED ORDER — LEVOFLOXACIN 500 MG PO TABS: 500.0000 mg | ORAL_TABLET | Freq: Every day | ORAL | Status: AC

## 2011-06-03 MED ORDER — METHYLPREDNISOLONE 4 MG PO KIT: PACK | ORAL | Status: DC

## 2011-06-03 NOTE — Patient Instructions (Addendum)
May use coricidin HP for cough and congestion You will get a Medrol dose pack which is a form of prednisone and you to get a new antibiotic called Levaquin for the next 7 days  I'm going to give you a nasal spray which you 'll use twice a day and hold her current nasal spray until after you're done with this

## 2011-06-03 NOTE — Progress Notes (Signed)
Subjective:    Patient ID: Lori [name], female    DOB: 1940-07-29, 71 y.o.   MRN: [id number]  HPI Started in Feb.... URI that [rogressed to sore throat. Was seen by FM anf treated for viral infection that then progressed to deep cough and congestion. Symptoms have progressed  Started the zpack Monday. Has increased congestion and difficultly sleeping due to sinus congestion. Has not filled the cough medications due to cost. Did not use OTC due to HTN    Review of Systems  Constitutional: Positive for fatigue. Negative for activity change and appetite change.  HENT: Positive for nosebleeds, congestion, rhinorrhea, sneezing and postnasal drip. Negative for ear pain, neck pain and sinus pressure.   Eyes: Negative for redness and visual disturbance.  Respiratory: Positive for cough and shortness of breath. Negative for wheezing.   Gastrointestinal: Negative for abdominal pain and abdominal distention.  Genitourinary: Negative for dysuria, frequency and menstrual problem.  Musculoskeletal: Negative for myalgias, joint swelling and arthralgias.  Skin: Negative for rash and wound.  Neurological: Positive for weakness. Negative for dizziness and headaches.  Hematological: Negative for adenopathy. Does not bruise/bleed easily.  Psychiatric/Behavioral: Negative for sleep disturbance and decreased concentration.   Past Medical History  Diagnosis Date  . Osteopenia   . Vasculitis   . PMR (polymyalgia rheumatica)   . Varicose veins with inflammation   . Allergy   . Low back pain   . Headache   . Hypertension     History   Social History  . Marital Status: Married    Spouse Name: N/A    Number of Children: N/A  . Years of Education: N/A   Occupational History  . retired    Social History Main Topics  . Smoking status: Never Smoker   . Smokeless tobacco: Not on file  . Alcohol Use: No  . Drug Use: No  . Sexually Active: Yes   Other Topics Concern  . Not on file    Social History Narrative  . No narrative on file    Past Surgical History  Procedure Date  . Dilation and curettage of uterus   . Cesarean section   . Tonsillectomy   . Popliteal synovial cyst excision   . Arthroscopic knee   . Carotid artery disection     Carotid Artery Dissection    Family History  Problem Relation Age of Onset  . Multiple myeloma Father     Allergies  Allergen Reactions  . Hydrocodone-Acetaminophen   . Neomycin   . Penicillins     REACTION: Arm swelling  . Pneumococcal Vaccine Polyvalent     Current Outpatient Prescriptions on File Prior to Visit  Medication Sig Dispense Refill  . aspirin 81 MG tablet Take 81 mg by mouth daily.        . benzonatate (TESSALON) 200 MG capsule Take 1 capsule (200 mg total) by mouth 3 (three) times daily as needed for cough.  30 capsule  0  . Calcium Carbonate-Vit D-Min (CALCIUM 1200 PO) Take by mouth daily.        . fenofibrate 160 MG tablet TAKE ONE TABLET BY MOUTH EVERY DAY  90 tablet  3  . fluticasone (FLONASE) 50 MCG/ACT nasal spray USE TWO SPRAY EACH NOSTRIL EVERY DAY  16 g  10  . meclizine (ANTIVERT) 25 MG tablet Take 1 tablet (25 mg total) by mouth 3 (three) times daily as needed.  30 tablet  3  . metoprolol (LOPRESSOR) 50 MG tablet Take 25  mg by mouth 2 (two) times daily.       . Omega-3 Fatty Acids (SEA-OMEGA 30) 1200 MG CAPS Take by mouth daily.        Marland Kitchen omeprazole (PRILOSEC) 40 MG capsule TAKE ONE CAPSULE BY MOUTH EVERY DAY  30 capsule  11  . DISCONTD: metoprolol (LOPRESSOR) 50 MG tablet Take 1 tablet (50 mg total) by mouth daily.  30 tablet  11    BP 136/80  Pulse 72  Temp 98.2 F (36.8 C)  Resp 16  Ht 5\' 6"  (1.676 m)  Wt 133 lb (60.328 kg)  BMI 21.47 kg/m2        Objective:   Physical Exam  Nursing note and vitals reviewed. Constitutional: She is oriented to person, place, and time. She appears well-developed and well-nourished. No distress.  HENT:  Head: Normocephalic and atraumatic.        Nasal passages were erythematous and swollen The STIR cobblestoning present  Eyes: Conjunctivae and EOM are normal. Pupils are equal, round, and reactive to light.  Neck: Normal range of motion. Neck supple. No JVD present. No tracheal deviation present. No thyromegaly present.  Cardiovascular: Normal rate, regular rhythm, normal heart sounds and intact distal pulses.   No murmur heard. Pulmonary/Chest: Effort normal and breath sounds normal. She has no wheezes. She has no rales. She exhibits no tenderness.  Abdominal: Soft. Bowel sounds are normal.  Musculoskeletal: Normal range of motion. She exhibits no edema and no tenderness.  Lymphadenopathy:    She has no cervical adenopathy.  Neurological: She is alert and oriented to person, place, and time. She has normal reflexes. No cranial nerve deficit.  Skin: Skin is warm and dry. She is not diaphoretic.  Psychiatric: She has a normal mood and affect. Her behavior is normal.          Assessment & Plan:  Patient has chronic sinusitis number is probably a bacterial sinusitis is not responding to the azithromycin.  We will change her antibiotics and monitor her carefully with consideration for sinus CT to have her symptoms persist she has mild cough and a chest x-ray should be ordered if her cough persists.  For now we are recommending Mucinex and continue the antibiotic that was prescribed today.  Addendum the patient was seen in the emergency room over the weekend for hemoptysis.  A. rule out PE protocol was performed and was negative however there was a small nodule seen on the lung CT that will require followup it was not felt to be of the size that it was immediately risk for neoplasm but a one-year followup CT was recommended by the radiologist will bring back the patient this week to discuss her hemoptysis and to set up followup for pulmonary nodule primary consultation will be arranged.

## 2011-06-05 ENCOUNTER — Emergency Department (HOSPITAL_COMMUNITY): Payer: Medicare Other

## 2011-06-05 ENCOUNTER — Emergency Department (HOSPITAL_COMMUNITY)
Admission: EM | Admit: 2011-06-05 | Discharge: 2011-06-06 | Disposition: A | Discharge status: Home / Self Care | Payer: Medicare Other | Attending: Emergency Medicine | Admitting: Emergency Medicine

## 2011-06-05 ENCOUNTER — Encounter (HOSPITAL_COMMUNITY): Payer: Self-pay | Admitting: Emergency Medicine

## 2011-06-05 DIAGNOSIS — K8020 Calculus of gallbladder without cholecystitis without obstruction: Secondary | ICD-10-CM | POA: Insufficient documentation

## 2011-06-05 DIAGNOSIS — F411 Generalized anxiety disorder: Secondary | ICD-10-CM | POA: Insufficient documentation

## 2011-06-05 DIAGNOSIS — R05 Cough: Secondary | ICD-10-CM

## 2011-06-05 DIAGNOSIS — R911 Solitary pulmonary nodule: Secondary | ICD-10-CM | POA: Insufficient documentation

## 2011-06-05 DIAGNOSIS — R042 Hemoptysis: Secondary | ICD-10-CM | POA: Insufficient documentation

## 2011-06-05 DIAGNOSIS — L851 Acquired keratosis [keratoderma] palmaris et plantaris: Secondary | ICD-10-CM | POA: Insufficient documentation

## 2011-06-05 DIAGNOSIS — R059 Cough, unspecified: Secondary | ICD-10-CM | POA: Insufficient documentation

## 2011-06-05 HISTORY — DX: Essential (primary) hypertension: I10

## 2011-06-05 LAB — URINALYSIS, ROUTINE W REFLEX MICROSCOPIC
Bilirubin Urine: NEGATIVE
Glucose, UA: NEGATIVE mg/dL
Ketones, ur: NEGATIVE mg/dL
Leukocytes, UA: NEGATIVE
Nitrite: NEGATIVE
Protein, ur: NEGATIVE mg/dL
Specific Gravity, Urine: 1.01 (ref 1.005–1.030)
Urobilinogen, UA: 0.2 mg/dL (ref 0.0–1.0)
pH: 7 (ref 5.0–8.0)

## 2011-06-05 LAB — CBC
HCT: 35.1 % — ABNORMAL LOW (ref 36.0–46.0)
Hemoglobin: 11.4 g/dL — ABNORMAL LOW (ref 12.0–15.0)
MCH: 27.7 pg (ref 26.0–34.0)
MCHC: 32.5 g/dL (ref 30.0–36.0)
MCV: 85.2 fL (ref 78.0–100.0)
Platelets: 457 10*3/uL — ABNORMAL HIGH (ref 150–400)
RBC: 4.12 MIL/uL (ref 3.87–5.11)
RDW: 13.7 % (ref 11.5–15.5)
WBC: 7.8 10*3/uL (ref 4.0–10.5)

## 2011-06-05 LAB — DIFFERENTIAL
Basophils Absolute: 0 10*3/uL (ref 0.0–0.1)
Basophils Relative: 0 % (ref 0–1)
Eosinophils Absolute: 0.1 10*3/uL (ref 0.0–0.7)
Eosinophils Relative: 1 % (ref 0–5)
Lymphocytes Relative: 25 % (ref 12–46)
Lymphs Abs: 2 10*3/uL (ref 0.7–4.0)
Monocytes Absolute: 0.8 10*3/uL (ref 0.1–1.0)
Monocytes Relative: 10 % (ref 3–12)
Neutro Abs: 5 10*3/uL (ref 1.7–7.7)
Neutrophils Relative %: 64 % (ref 43–77)

## 2011-06-05 LAB — URINE MICROSCOPIC-ADD ON

## 2011-06-05 LAB — BASIC METABOLIC PANEL
BUN: 15 mg/dL (ref 6–23)
CO2: 26 mEq/L (ref 19–32)
Calcium: 9.7 mg/dL (ref 8.4–10.5)
Chloride: 100 mEq/L (ref 96–112)
Creatinine, Ser: 0.59 mg/dL (ref 0.50–1.10)
GFR calc Af Amer: >90 mL/min (ref >90)
GFR calc non Af Amer: >90 mL/min (ref >90)
Glucose, Bld: 99 mg/dL (ref 70–99)
Potassium: 3.8 mEq/L (ref 3.5–5.1)
Sodium: 138 mEq/L (ref 135–145)

## 2011-06-05 LAB — APTT: aPTT: 43 seconds — ABNORMAL HIGH (ref 24–37)

## 2011-06-05 LAB — PROTIME-INR
INR: 1.02 (ref 0.00–1.49)
Prothrombin Time: 13.6 seconds (ref 11.6–15.2)

## 2011-06-05 NOTE — ED Notes (Signed)
Patient transported to X-ray 

## 2011-06-05 NOTE — ED Provider Notes (Signed)
History     CSN: [id number]  Arrival date & time 06/05/11  2006   First MD Initiated Contact with Patient 06/05/11 2026      Chief Complaint  Patient presents with  . Coughing up blood      Patient is a 71 y.o. female presenting with cough. The history is provided by the patient.  Cough This is a new problem. The current episode started more than 1 week ago. The problem has been gradually improving. There has been no fever. Pertinent negatives include no chest pain, no chills, no myalgias, no shortness of breath and no wheezing. She is not a smoker.  Patient reports approximately 3 weeks of persistent productive cough. Secretions have been clear. Has been seen by her PCP numerous times regarding this cough. Is now on her second round of antibiotics (Levaquin) and prednisone after a failed trial of Z-Pak. Since being on the Levaquin patient admits that she has been feeling better and the cough has improved. Tonight after having dinner she took her medication. Approximately 45 minutes later she states she attempted to eat some peaches which cause severe burning when she was trying to swallow the peaches. She attempted to eat saltine crackers to "absorb the burning" which made her start cough. During one point her coughing she coughed up approximately a quarter size amount of blood. She denies that this was sputum tinged with blood. She insists this was. Blood. Patient is not a smoker and has had no previous chronic lung disease. She has had no fever or other associated symptoms. She denies recent long distance travel by car or air, is not on hormone replacement therapy, has no history of cancer or DVT. Patient denies that she ever had chest pain or shortness of breath. Denies nausea vomiting or UTI symptoms as well. Patient is also concerned about some cracking in her scan across her knuckles on both hands that has started to bleed. She admits to history of eczema and similar cracks in her fingers  especially in the cold weather, but states they've never bled. Patient states she takes a daily low-dose aspirin and is concerned there may be some problems with her clotting.  Past Medical History  Diagnosis Date  . Osteopenia   . Vasculitis   . PMR (polymyalgia rheumatica)   . Varicose veins with inflammation   . Allergy   . Low back pain   . Headache     Past Surgical History  Procedure Date  . Dilation and curettage of uterus   . Cesarean section   . Tonsillectomy   . Popliteal synovial cyst excision   . Arthroscopic knee   . Carotid artery disection     Carotid Artery Dissection    Family History  Problem Relation Age of Onset  . Multiple myeloma Father     History  Substance Use Topics  . Smoking status: Never Smoker   . Smokeless tobacco: Not on file  . Alcohol Use: No    OB History    Grav Para Term Preterm Abortions TAB SAB Ect Mult Living                  Review of Systems  Constitutional: Negative.  Negative for chills.  HENT: Negative.   Eyes: Negative.   Respiratory: Positive for cough. Negative for shortness of breath and wheezing.   Cardiovascular: Negative.  Negative for chest pain.  Gastrointestinal: Negative.   Genitourinary: Negative.   Musculoskeletal: Negative.  Negative for myalgias.  Skin: Negative.   Neurological: Negative.   Hematological: Negative.   Psychiatric/Behavioral: Negative.     Allergies  Hydrocodone-acetaminophen; Neomycin; Penicillins; and Pneumococcal vaccine polyvalent  Home Medications   Current Outpatient Rx  Name Route Sig Dispense Refill  . ASPIRIN 81 MG PO TABS Oral Take 81 mg by mouth daily.      Marland Kitchen BENZONATATE 200 MG PO CAPS Oral Take 1 capsule (200 mg total) by mouth 3 (three) times daily as needed for cough. 30 capsule 0  . CALCIUM 1200 PO Oral Take by mouth daily.      . FENOFIBRATE 160 MG PO TABS  TAKE ONE TABLET BY MOUTH EVERY DAY 90 tablet 3  . FLUTICASONE PROPIONATE 50 MCG/ACT NA SUSP  USE TWO  SPRAY EACH NOSTRIL EVERY DAY 16 g 10  . LEVOFLOXACIN 500 MG PO TABS Oral Take 1 tablet (500 mg total) by mouth daily. 7 tablet 0  . MECLIZINE HCL 25 MG PO TABS Oral Take 1 tablet (25 mg total) by mouth 3 (three) times daily as needed. 30 tablet 3  . METHYLPREDNISOLONE 4 MG PO KIT  follow package directions 21 tablet 0  . METOPROLOL TARTRATE 50 MG PO TABS Oral Take 25 mg by mouth 2 (two) times daily.     . SEA-OMEGA 30 1200 MG PO CAPS Oral Take by mouth daily.      Marland Kitchen OMEPRAZOLE 40 MG PO CPDR  TAKE ONE CAPSULE BY MOUTH EVERY DAY 30 capsule 11    BP 183/76  Pulse 113  Temp(Src) 97.6 F (36.4 C) (Oral)  Resp 18  SpO2 97%  Physical Exam  Constitutional: She is oriented to person, place, and time. She appears well-developed and well-nourished.  HENT:  Head: Normocephalic and atraumatic.  Eyes: Conjunctivae are normal.  Neck: Neck supple.  Cardiovascular: Normal rate and regular rhythm.   Pulmonary/Chest: Effort normal and breath sounds normal.  Abdominal: Soft. Bowel sounds are normal.  Musculoskeletal: Normal range of motion.  Neurological: She is alert and oriented to person, place, and time.  Skin: Skin is warm and dry. No erythema.       Extremely dry scaly skin to bilateral hands consistent with chronic eczema. New small pars final cracks in the skin on the dorsal aspect of hands over the DIP, noted to be oozing blood at times.  Psychiatric: Her mood appears anxious.    ED Course  Procedures   Patient has declined medication for pain or cough. Will obtain chest x-ray, appropriate screening labs and reevaluate.  I have discussed patient with Dr. Denton Lank. Will obtain CT chest angiogram to rule out PE. Patient agreeable with plan.  Findings and clinical impression discussed with patient. I have reviewed the in incidental findings on the CT chest and the need for follow up with her primary care physician regarding these findings. Patient verbalizes understanding and is agreeable with  plan. Will plan for discharge home and encourage close follow up with PCP as discussed. Patient agreeable with plan.  Labs Reviewed - No data to display No results found.   No diagnosis found.    MDM  HPI/PE and clinical findings c/w 1. Hemoptysis (single episode of hemoptysis, CT chest angiogram for PE but with multiple incidental findings including a nodule to the left upper lobe, patient is without shortness of breath, chest pain or other associated symptoms) 2. Eczema  Patient already has her own medication for the eczema. Patient understands importance of followup regarding incidental findings on chest CT. She is  to call and arrange followup with Dr. Lovell Sheehan to discuss these findings and appropriate followup.        Leanne Chang, NP 06/07/11 [phone]

## 2011-06-05 NOTE — ED Notes (Signed)
NP at bedside.

## 2011-06-05 NOTE — ED Notes (Signed)
Pt presented to the ER with c/o coughing up blood, pt states that in the last few weeks she is not feeling good, seen and Dx with her PCP with bacterial sinusitis, however, reports that "today is something going on", pt reports increase coughing, productive, and when about 20 min ago, she had a coughing spell noted bright red blood. Py also reports that earlier today she started to develop "unusual burning in my stomach".

## 2011-06-06 ENCOUNTER — Encounter (HOSPITAL_COMMUNITY): Payer: Self-pay | Admitting: Radiology

## 2011-06-06 MED ORDER — IOHEXOL 300 MG/ML  SOLN
100.0000 mL | Freq: Once | INTRAMUSCULAR | Status: AC | PRN
  Administered 2011-06-06: 100 mL via INTRAVENOUS

## 2011-06-06 NOTE — Discharge Instructions (Signed)
You were seen in the emergency department tonight for your episode of coughing up blood. All of your blood work, chest x-ray and urine test were normal. Who performed a Cat Scan of her chest that was negative for a clot in your lung. There were some incidental findings that we have discussed. He will need to arrange followup with Dr. Lovell Sheehan to discuss these findings and appropriate follow up for them. Continue Levaquin and the Prednisone as previously instructed by Dr. Lovell Sheehan. Return for worsening symptoms, otherwise call Monday to arrange your followup appointment with Dr. Lovell Sheehan.    Cough, Adult  A cough is a reflex. It helps you clear your throat and airways. A cough can help heal your body. A cough can last 2 or 3 weeks (acute) or may last more than 8 weeks (chronic). Some common causes of a cough can include an infection, allergy, or a cold. HOME CARE  Only take medicine as told by your doctor.   If given, take your medicines (antibiotics) as told. Finish them even if you start to feel better.   Use a cold steam vaporizer or humidier in your home. This can help loosen thick spit (secretions).   Sleep so you are almost sitting up (semi-upright). Use pillows to do this. This helps reduce coughing.   Rest as needed.   Stop smoking if you smoke.  GET HELP RIGHT AWAY IF:  You have yellowish-white fluid (pus) in your thick spit.   Your cough gets worse.   Your medicine does not reduce coughing, and you are losing sleep.   You cough up blood.   You have trouble breathing.   Your pain gets worse and medicine does not help.   You have a fever.  MAKE SURE YOU:   Understand these instructions.   Will watch your condition.   Will get help right away if you are not doing well or get worse.  Document Released: 11/19/2010 Document Revised: 02/25/2011 Document Reviewed: 11/19/2010 [facility] Patient Information 2012 [place], [place].Hemoptysis Hemoptysis means coughing up blood  from some part of the respiratory tract. The respiratory tract includes the nose, mouth, throat, airway passages, and lungs. You should always contact a caregiver if you develop hemoptysis. This is important as even mild cases of hemoptysis may lead to serious breathing or bleeding problems. Major bleeding from the airway is considered a medical emergency, and needs to be evaluated and managed promptly to avoid complications, disability, or death. Hemoptysis may reoccur from time to time. CAUSES  In some cases, the cause of hemoptysis is not known. Causes may include:  A ruptured blood vessel caused by coughing or an infection.   Bronchiectasis. Bronchiectasis is a long-standing infection of the small air passageways.   A blood clot in the lungs (pulmonary embolism).   Pneumonia.   Tuberculosis.   Breathing in a small foreign object.   Cancer.  DIAGNOSIS  Diagnosing the cause of hemoptysis is important. The most common cause of hemoptysis is a ruptured blood vessel caused by coughing or a mild infection. This is normally no cause for concern. Diagnosing the cause for hemoptysis is based on your history, symptoms, physical examination, and diagnostic tests. Tests for diagnosing the cause of hemoptysis may include:  Blood samples.   A chest X-ray.   A computerized X-ray scan (CT scan or CAT scan).   Bronchoscopy. This test uses a flexible tube (a bronchoscope) to see inside the lungs.   Pulmonary angiography. Pulmonary angiography is an X-ray  procedure that looks at the vessels in the lungs. Angiography produces a picture called an angiogram. It requires injecting a dye into the blood vessels.  TREATMENT   Treatment for hemoptysis depends on the cause. It also depends on the quantity of blood. Infrequent, mild hemoptysis usually does not require specific, immediate treatment.   If the cause of hemoptysis is unknown, treatment may involve monitoring for at least 2 or 3 years. If you  have a normal chest X-ray and bronchoscopy, the hemoptysis usually clears within 6 months.   Treatment of a pneumonia, chronic bronchiectasis, or tuberculosis usually involves medicines to fight the infections.   For lung cancers, treatment depends on the stage of the cancer.   Major bleeding is a medical emergency. Steps are usually taken to find the source and stop the bleeding.   Some methods of controlling active moderate to severe bleeding include:   Surgical removal (resection). Tissue causing the hemoptysis is removed.   Bronchoscopic laser therapy. During a bronchoscopy, a laser is used to remove tumors and lesions or widen airways.   Bronchial artery embolization. This procedure involves injecting substances into the bloodstream to stop blood flow.  SEEK IMMEDIATE MEDICAL CARE IF:   You begin to cough up large amounts of blood.   You develop problems with your breathing.   You begin vomiting blood or see blood in your stool.   Develop chest pain.   Feel faint or pass out.   You develop a fever over 102 F (38.9 C), or as your caregiver suggests.  Document Released: 05/17/2001 Document Revised: 02/25/2011 Document Reviewed: 07/22/2009 [facility] Patient Information 2012 [place], [place].Hemoptysis Hemoptysis means coughing up blood from some part of the respiratory tract. The respiratory tract includes the nose, mouth, throat, airway passages, and lungs. You should always contact a caregiver if you develop hemoptysis. This is important as even mild cases of hemoptysis may lead to serious breathing or bleeding problems. Major bleeding from the airway is considered a medical emergency, and needs to be evaluated and managed promptly to avoid complications, disability, or death. Hemoptysis may reoccur from time to time. CAUSES  In some cases, the cause of hemoptysis is not known. Causes may include:  A ruptured blood vessel caused by coughing or an infection.   Bronchiectasis.  Bronchiectasis is a long-standing infection of the small air passageways.   A blood clot in the lungs (pulmonary embolism).   Pneumonia.   Tuberculosis.   Breathing in a small foreign object.   Cancer.  DIAGNOSIS  Diagnosing the cause of hemoptysis is important. The most common cause of hemoptysis is a ruptured blood vessel caused by coughing or a mild infection. This is normally no cause for concern. Diagnosing the cause for hemoptysis is based on your history, symptoms, physical examination, and diagnostic tests. Tests for diagnosing the cause of hemoptysis may include:  Blood samples.   A chest X-ray.   A computerized X-ray scan (CT scan or CAT scan).   Bronchoscopy. This test uses a flexible tube (a bronchoscope) to see inside the lungs.   Pulmonary angiography. Pulmonary angiography is an X-ray procedure that looks at the vessels in the lungs. Angiography produces a picture called an angiogram. It requires injecting a dye into the blood vessels.  TREATMENT   Treatment for hemoptysis depends on the cause. It also depends on the quantity of blood. Infrequent, mild hemoptysis usually does not require specific, immediate treatment.   If the cause of hemoptysis is unknown, treatment may  involve monitoring for at least 2 or 3 years. If you have a normal chest X-ray and bronchoscopy, the hemoptysis usually clears within 6 months.   Treatment of a pneumonia, chronic bronchiectasis, or tuberculosis usually involves medicines to fight the infections.   For lung cancers, treatment depends on the stage of the cancer.   Major bleeding is a medical emergency. Steps are usually taken to find the source and stop the bleeding.   Some methods of controlling active moderate to severe bleeding include:   Surgical removal (resection). Tissue causing the hemoptysis is removed.   Bronchoscopic laser therapy. During a bronchoscopy, a laser is used to remove tumors and lesions or widen airways.     Bronchial artery embolization. This procedure involves injecting substances into the bloodstream to stop blood flow.  SEEK IMMEDIATE MEDICAL CARE IF:   You begin to cough up large amounts of blood.   You develop problems with your breathing.   You begin vomiting blood or see blood in your stool.   Develop chest pain.   Feel faint or pass out.   You develop a fever over 102 F (38.9 C), or as your caregiver suggests.  Document Released: 05/17/2001 Document Revised: 02/25/2011 Document Reviewed: 07/22/2009 [facility] Patient Information 2012 [place], [place].

## 2011-06-07 NOTE — ED Provider Notes (Signed)
Medical screening examination/treatment/procedure(s) were conducted as a shared visit with non-physician practitioner(s) and myself.  I personally evaluated the patient during the encounter Pt with report cough, generally non productive but coughed up blood today. Chest w no focal rales. No increased wob. Ct pending.   Suzi Roots, MD 06/07/11 [phone]

## 2011-06-08 ENCOUNTER — Encounter: Payer: Self-pay | Admitting: Internal Medicine

## 2011-06-08 ENCOUNTER — Ambulatory Visit (INDEPENDENT_AMBULATORY_CARE_PROVIDER_SITE_OTHER): Payer: Medicare Other | Admitting: Internal Medicine

## 2011-06-08 VITALS — BP 130/80 | HR 76 | Temp 98.2°F | Resp 16 | Ht 66.0 in | Wt 128.0 lb

## 2011-06-08 DIAGNOSIS — R911 Solitary pulmonary nodule: Secondary | ICD-10-CM

## 2011-06-08 DIAGNOSIS — B370 Candidal stomatitis: Secondary | ICD-10-CM

## 2011-06-08 DIAGNOSIS — R634 Abnormal weight loss: Secondary | ICD-10-CM

## 2011-06-08 MED ORDER — NYSTATIN 100000 UNIT/ML MT SUSP: 500000.0000 [IU] | Freq: Four times a day (QID) | OROMUCOSAL | Status: AC

## 2011-06-08 NOTE — Progress Notes (Signed)
Subjective:    Patient ID: Lori [name], female    DOB: [birth year], 71 y.o.   MRN: [id number]  HPI D68-year-old white female who presented to the emergency room with hemoptysis. She was seen earlier with a upper respiratory tract/sinus infection and was changed from azithromycin to Levaquin.  She developed what she thought might be oral thrush have a sore mouth and throat. She opened a can of peaches and when she tried to swallow a peach she felt a sensation of burning and fire in the throat. He still has some symptoms of persistent thrush she is also on steroids for the upper respiratory tract infection.  From the standpoint of the upper respiratory tract infection she appears to be better less hoarseness that her breathing.   Review of Systems  Constitutional: Negative for activity change, appetite change and fatigue.  HENT: Positive for nosebleeds, congestion, sore throat, rhinorrhea, mouth sores, trouble swallowing and postnasal drip. Negative for ear pain, neck pain and sinus pressure.   Eyes: Negative for redness and visual disturbance.  Respiratory: Negative for cough, shortness of breath and wheezing.   Gastrointestinal: Negative for abdominal pain and abdominal distention.  Genitourinary: Negative for dysuria, frequency and menstrual problem.  Musculoskeletal: Negative for myalgias, joint swelling and arthralgias.  Skin: Negative for rash and wound.  Neurological: Negative for dizziness, weakness and headaches.  Hematological: Negative for adenopathy. Does not bruise/bleed easily.  Psychiatric/Behavioral: Negative for sleep disturbance and decreased concentration.   Past Medical History  Diagnosis Date  . Osteopenia   . Vasculitis   . PMR (polymyalgia rheumatica)   . Varicose veins with inflammation   . Allergy   . Low back pain   . Headache   . Hypertension     History   Social History  . Marital Status: Married    Spouse Name: N/A    Number of Children: N/A    . Years of Education: N/A   Occupational History  . retired    Social History Main Topics  . Smoking status: Never Smoker   . Smokeless tobacco: Not on file  . Alcohol Use: No  . Drug Use: No  . Sexually Active: Yes   Other Topics Concern  . Not on file   Social History Narrative  . No narrative on file    Past Surgical History  Procedure Date  . Dilation and curettage of uterus   . Cesarean section   . Tonsillectomy   . Popliteal synovial cyst excision   . Arthroscopic knee   . Carotid artery disection     Carotid Artery Dissection    Family History  Problem Relation Age of Onset  . Multiple myeloma Father     Allergies  Allergen Reactions  . Hydrocodone-Acetaminophen   . Neomycin   . Penicillins     REACTION: Arm swelling  . Pneumococcal Vaccine Polyvalent     Current Outpatient Prescriptions on File Prior to Visit  Medication Sig Dispense Refill  . aspirin 81 MG tablet Take 81 mg by mouth daily.        . Calcium Carbonate-Vit D-Min (CALCIUM 1200 PO) Take by mouth daily.        . fenofibrate 160 MG tablet TAKE ONE TABLET BY MOUTH EVERY DAY  90 tablet  3  . fluticasone (FLONASE) 50 MCG/ACT nasal spray USE TWO SPRAY EACH NOSTRIL EVERY DAY  16 g  10  . levofloxacin (LEVAQUIN) 500 MG tablet Take 1 tablet (500 mg total) by mouth daily.  7 tablet  0  . meclizine (ANTIVERT) 25 MG tablet Take 1 tablet (25 mg total) by mouth 3 (three) times daily as needed.  30 tablet  3  . metoprolol (LOPRESSOR) 50 MG tablet Take 25 mg by mouth 2 (two) times daily.       . Omega-3 Fatty Acids (SEA-OMEGA 30) 1200 MG CAPS Take by mouth daily.        Marland Kitchen omeprazole (PRILOSEC) 40 MG capsule TAKE ONE CAPSULE BY MOUTH EVERY DAY  30 capsule  11  . DISCONTD: metoprolol (LOPRESSOR) 50 MG tablet Take 1 tablet (50 mg total) by mouth daily.  30 tablet  11    BP 130/80  Pulse 76  Temp 98.2 F (36.8 C)  Resp 16  Ht 5\' 6"  (1.676 m)  Wt 128 lb (58.06 kg)  BMI 20.66 kg/m2        Objective:   Physical Exam  Nursing note and vitals reviewed. Constitutional: She is oriented to person, place, and time. She appears well-developed and well-nourished.  HENT:  Head: Normocephalic and atraumatic.  Eyes:       White discharge  Classic thrush  Neck: Normal range of motion. Neck supple.  Pulmonary/Chest: Effort normal and breath sounds normal.  Abdominal: Soft. Bowel sounds are normal.  Musculoskeletal: Normal range of motion.  Neurological: She is alert and oriented to person, place, and time.  Skin: Skin is warm and dry.          Assessment & Plan:  Patient was treated with 2 antibiotics and steroid pack and developed oral thrush she still has thrush apparent on the tongue and the posterior pharynx and this is most probably candida in etiology we will place her on nystatin swish and swallow 10 cc 4 times a day for the next week.  She will complete her antibiotics for operation to retract infection at the same time continue to taper off the steroids on physical examination her lung fields are clear her sinuses appear to be better and the primary new complaint is the oral thrush.  Incidental finding on CT was pulmonary nodule and gall stones

## 2011-06-08 NOTE — Patient Instructions (Signed)
The patient is instructed to continue all medications as prescribed. Schedule followup with check out clerk upon leaving the clinic  

## 2011-06-13 ENCOUNTER — Other Ambulatory Visit: Payer: Self-pay | Admitting: Internal Medicine

## 2011-06-23 ENCOUNTER — Ambulatory Visit (INDEPENDENT_AMBULATORY_CARE_PROVIDER_SITE_OTHER): Payer: Medicare Other | Admitting: Internal Medicine

## 2011-06-23 ENCOUNTER — Encounter: Payer: Self-pay | Admitting: Internal Medicine

## 2011-06-23 VITALS — BP 124/80 | HR 72 | Temp 98.2°F | Resp 16 | Ht 66.0 in | Wt 129.0 lb

## 2011-06-23 DIAGNOSIS — K8050 Calculus of bile duct without cholangitis or cholecystitis without obstruction: Secondary | ICD-10-CM

## 2011-06-23 DIAGNOSIS — K219 Gastro-esophageal reflux disease without esophagitis: Secondary | ICD-10-CM

## 2011-06-23 MED ORDER — OMEPRAZOLE 40 MG PO CPDR: 40.0000 mg | DELAYED_RELEASE_CAPSULE | Freq: Every day | ORAL | Status: DC

## 2011-06-23 NOTE — Progress Notes (Signed)
  Subjective:    Patient ID: Lori [name], female    DOB: 13-Apr-1940, 71 y.o.   MRN: [id number]  HPI patient presents today to review her treatment for thrush and to discuss the incidental finding of gallstones and her x-ray.  She currently has asymptomatic choledocholithiasis We discussed precipitating factors for this to become cholecystitis whether or not current surgery is indicated and what might precipitate worsening of gallstone disease    Review of Systems  Constitutional: Negative for activity change, appetite change and fatigue.  HENT: Negative for ear pain, congestion, neck pain, postnasal drip and sinus pressure.   Eyes: Negative for redness and visual disturbance.  Respiratory: Negative for cough, shortness of breath and wheezing.   Gastrointestinal: Negative for abdominal pain and abdominal distention.  Genitourinary: Negative for dysuria, frequency and menstrual problem.  Musculoskeletal: Negative for myalgias, joint swelling and arthralgias.  Skin: Negative for rash and wound.  Neurological: Negative for dizziness, weakness and headaches.  Hematological: Negative for adenopathy. Does not bruise/bleed easily.  Psychiatric/Behavioral: Negative for sleep disturbance and decreased concentration.       Objective:   Physical Exam  Nursing note and vitals reviewed. Constitutional: She is oriented to person, place, and time. She appears well-developed and well-nourished. No distress.  HENT:  Head: Normocephalic and atraumatic.  Right Ear: External ear normal.  Left Ear: External ear normal.  Nose: Nose normal.  Mouth/Throat: Oropharynx is clear and moist.  Eyes: Conjunctivae and EOM are normal. Pupils are equal, round, and reactive to light.  Neck: Normal range of motion. Neck supple. No JVD present. No tracheal deviation present. No thyromegaly present.  Cardiovascular: Normal rate, regular rhythm, normal heart sounds and intact distal pulses.   No murmur  heard. Pulmonary/Chest: Effort normal and breath sounds normal. She has no wheezes. She exhibits no tenderness.  Abdominal: Soft. Bowel sounds are normal.  Musculoskeletal: Normal range of motion. She exhibits no edema and no tenderness.  Lymphadenopathy:    She has no cervical adenopathy.  Neurological: She is alert and oriented to person, place, and time. She has normal reflexes. No cranial nerve deficit.  Skin: Skin is warm and dry. She is not diaphoretic.  Psychiatric: She has a normal mood and affect. Her behavior is normal.          Assessment & Plan:  The patient has significantly improved from her presentation two weeks ago  she is doing well The oral thrush has completely resolved He discussed today the gallstones and appropriate diet to prevent choledocholithiasis worsening and to cholecystitis

## 2011-06-23 NOTE — Patient Instructions (Signed)
The patient is instructed to continue all medications as prescribed. Schedule followup with check out clerk upon leaving the clinic  

## 2011-07-15 ENCOUNTER — Other Ambulatory Visit: Payer: Self-pay | Admitting: Internal Medicine

## 2011-08-31 ENCOUNTER — Ambulatory Visit: Payer: Medicare Other | Admitting: Internal Medicine

## 2011-10-27 ENCOUNTER — Encounter: Payer: Self-pay | Admitting: Internal Medicine

## 2011-10-27 ENCOUNTER — Ambulatory Visit (INDEPENDENT_AMBULATORY_CARE_PROVIDER_SITE_OTHER): Payer: Medicare Other | Admitting: Internal Medicine

## 2011-10-27 VITALS — BP 134/80 | HR 68 | Temp 98.6°F | Resp 16 | Ht 67.0 in | Wt 126.0 lb

## 2011-10-27 DIAGNOSIS — Z0000 Encounter for general adult medical examination without abnormal findings: Secondary | ICD-10-CM

## 2011-10-27 DIAGNOSIS — M8580 Other specified disorders of bone density and structure, unspecified site: Secondary | ICD-10-CM

## 2011-10-27 DIAGNOSIS — Z79899 Other long term (current) drug therapy: Secondary | ICD-10-CM

## 2011-10-27 DIAGNOSIS — M949 Disorder of cartilage, unspecified: Secondary | ICD-10-CM

## 2011-10-27 LAB — BASIC METABOLIC PANEL
GFR: 118.75 mL/min (ref >60.00)
Potassium: 3.9 mEq/L (ref 3.5–5.1)
Sodium: 140 mEq/L (ref 135–145)

## 2011-10-27 LAB — CBC WITH DIFFERENTIAL/PLATELET
Basophils Relative: 0.4 % (ref 0.0–3.0)
Eosinophils Absolute: 0.4 10*3/uL (ref 0.0–0.7)
Lymphocytes Relative: 35.5 % (ref 12.0–46.0)
MCHC: 32.2 g/dL (ref 30.0–36.0)
Neutrophils Relative %: 46.2 % (ref 43.0–77.0)
Platelets: 330 10*3/uL (ref 150.0–400.0)
RBC: 4.3 Mil/uL (ref 3.87–5.11)
WBC: 6.7 10*3/uL (ref 4.5–10.5)

## 2011-10-27 LAB — POCT URINALYSIS DIPSTICK
Ketones, UA: NEGATIVE
Leukocytes, UA: NEGATIVE
Nitrite, UA: NEGATIVE
Protein, UA: NEGATIVE
Urobilinogen, UA: 0.2

## 2011-10-27 LAB — HEPATIC FUNCTION PANEL
AST: 29 U/L (ref 0–37)
Alkaline Phosphatase: 50 U/L (ref 39–117)
Total Bilirubin: 0.4 mg/dL (ref 0.3–1.2)

## 2011-10-27 LAB — TSH: TSH: 0.84 u[IU]/mL (ref 0.35–5.50)

## 2011-10-27 LAB — LIPID PANEL
Cholesterol: 152 mg/dL (ref 0–200)
LDL Cholesterol: 81 mg/dL (ref 0–99)
VLDL: 9.2 mg/dL (ref 0.0–40.0)

## 2011-10-27 MED ORDER — CALCIUM-VITAMIN D 500-200 MG-UNIT PO TABS: 1.0000 | ORAL_TABLET | Freq: Two times a day (BID) | ORAL | Status: DC

## 2011-10-27 NOTE — Patient Instructions (Signed)
The patient is instructed to continue all medications as prescribed. Schedule followup with check out clerk upon leaving the clinic  

## 2011-10-27 NOTE — Progress Notes (Signed)
Subjective:    Patient ID: Lori [name], female    DOB: May 12, 1940, 71 y.o.   MRN: [id number]  HPI wellness exam Reviewed vitamins Blood pressure stable Increased arthritic pain in knee right greater that left   Review of Systems  Constitutional: Negative for activity change, appetite change and fatigue.  HENT: Negative for ear pain, congestion, neck pain, postnasal drip and sinus pressure.   Eyes: Negative for redness and visual disturbance.  Respiratory: Negative for cough, shortness of breath and wheezing.   Gastrointestinal: Negative for abdominal pain and abdominal distention.  Genitourinary: Negative for dysuria, frequency and menstrual problem.  Musculoskeletal: Negative for myalgias, joint swelling and arthralgias.  Skin: Negative for rash and wound.  Neurological: Negative for dizziness, weakness and headaches.  Hematological: Negative for adenopathy. Does not bruise/bleed easily.  Psychiatric/Behavioral: Negative for disturbed wake/sleep cycle and decreased concentration.   Past Medical History  Diagnosis Date  . Osteopenia   . Vasculitis   . PMR (polymyalgia rheumatica)   . Varicose veins with inflammation   . Allergy   . Low back pain   . Headache   . Hypertension     History   Social History  . Marital Status: Married    Spouse Name: N/A    Number of Children: N/A  . Years of Education: N/A   Occupational History  . retired    Social History Main Topics  . Smoking status: Never Smoker   . Smokeless tobacco: Not on file  . Alcohol Use: No  . Drug Use: No  . Sexually Active: Yes   Other Topics Concern  . Not on file   Social History Narrative  . No narrative on file    Past Surgical History  Procedure Date  . Dilation and curettage of uterus   . Cesarean section   . Tonsillectomy   . Popliteal synovial cyst excision   . Arthroscopic knee   . Carotid artery disection     Carotid Artery Dissection    Family History  Problem  Relation Age of Onset  . Multiple myeloma Father     Allergies  Allergen Reactions  . Azithromycin Other (See Comments)    Thrush   . Hydrocodone-Acetaminophen   . Neomycin   . Penicillins     REACTION: Arm swelling  . Pneumococcal Vaccine Polyvalent     Current Outpatient Prescriptions on File Prior to Visit  Medication Sig Dispense Refill  . aspirin 81 MG tablet Take 81 mg by mouth daily.        . betamethasone dipropionate (DIPROLENE) 0.05 % cream Apply topically 2 (two) times daily as needed.      . clidinium-chlordiazePOXIDE (LIBRAX) 2.5-5 MG per capsule Take 1 capsule by mouth 3 (three) times daily as needed.      . fenofibrate 160 MG tablet TAKE ONE TABLET BY MOUTH EVERY DAY  90 tablet  3  . meclizine (ANTIVERT) 25 MG tablet Take 1 tablet (25 mg total) by mouth 3 (three) times daily as needed.  30 tablet  3  . metoprolol (LOPRESSOR) 50 MG tablet Take 25 mg by mouth 2 (two) times daily.      . metoprolol (LOPRESSOR) 50 MG tablet TAKE ONE TABLET BY MOUTH EVERY DAY  30 tablet  6  . Omega-3 Fatty Acids (SEA-OMEGA 30) 1200 MG CAPS Take by mouth daily.        Marland Kitchen omeprazole (PRILOSEC) 40 MG capsule Take 1 capsule (40 mg total) by mouth daily.  30  capsule  11  . DISCONTD: fluticasone (FLONASE) 50 MCG/ACT nasal spray USE TWO SPRAY EACH NOSTRIL EVERY DAY  16 g  10  . Calcium Carbonate-Vit D-Min (CALCIUM 1200 PO) Take by mouth daily.        Marland Kitchen DISCONTD: metoprolol (LOPRESSOR) 50 MG tablet Take 25 mg by mouth 2 (two) times daily.         BP 134/80  Pulse 68  Temp 98.6 F (37 C)  Resp 16  Ht 5\' 7"  (1.702 m)  Wt 126 lb (57.153 kg)  BMI 19.73 kg/m2       Objective:   Physical Exam  Nursing note and vitals reviewed. Constitutional: She is oriented to person, place, and time. She appears well-developed and well-nourished. No distress.  HENT:  Head: Normocephalic and atraumatic.  Right Ear: External ear normal.  Left Ear: External ear normal.  Nose: Nose normal.  Mouth/Throat:  Oropharynx is clear and moist.  Eyes: Conjunctivae and EOM are normal. Pupils are equal, round, and reactive to light.  Neck: Normal range of motion. Neck supple. No JVD present. No tracheal deviation present. No thyromegaly present.  Cardiovascular: Normal rate, regular rhythm, normal heart sounds and intact distal pulses.   No murmur heard. Pulmonary/Chest: Effort normal and breath sounds normal. She has no wheezes. She exhibits no tenderness.  Abdominal: Soft. Bowel sounds are normal.  Musculoskeletal: Normal range of motion. She exhibits no edema and no tenderness.  Lymphadenopathy:    She has no cervical adenopathy.  Neurological: She is alert and oriented to person, place, and time. She has normal reflexes. No cranial nerve deficit.  Skin: Skin is warm and dry. She is not diaphoretic.  Psychiatric: She has a normal mood and affect. Her behavior is normal.          Assessment & Plan:   This is a routine physical examination for this healthy  Female. Reviewed all health maintenance protocols including mammography colonoscopy bone density and reviewed appropriate screening labs. Her immunization history was reviewed as well as her current medications and allergies refills of her chronic medications were given and the plan for yearly health maintenance was discussed all orders and referrals were made as appropriate.  Discussion of gall stones and use of calcium and vitamin d Labs ordered for screening Increased arthritis in knee Had arthroscopy in past Reviewed colon cancer screening  Due at 10 year interval

## 2011-10-28 LAB — VITAMIN D 25 HYDROXY (VIT D DEFICIENCY, FRACTURES): Vit D, 25-Hydroxy: 19 ng/mL — ABNORMAL LOW (ref 30–89)

## 2011-11-05 ENCOUNTER — Telehealth: Payer: Self-pay | Admitting: Internal Medicine

## 2011-11-05 MED ORDER — VITAMIN D (ERGOCALCIFEROL) 1.25 MG (50000 UNIT) PO CAPS: 50000.0000 [IU] | ORAL_CAPSULE | ORAL | Status: DC

## 2011-11-05 NOTE — Telephone Encounter (Signed)
All ok except for low vitamin d- send in 50,000 u q week- pt informed

## 2011-11-05 NOTE — Telephone Encounter (Signed)
Done

## 2011-11-05 NOTE — Telephone Encounter (Signed)
Pt would like blood work results from last week °

## 2011-11-15 ENCOUNTER — Encounter: Payer: Self-pay | Admitting: Internal Medicine

## 2011-11-15 ENCOUNTER — Ambulatory Visit (INDEPENDENT_AMBULATORY_CARE_PROVIDER_SITE_OTHER): Payer: Medicare Other | Admitting: Internal Medicine

## 2011-11-15 VITALS — BP 134/70 | HR 72 | Temp 98.2°F | Resp 16 | Ht 67.0 in | Wt 126.0 lb

## 2011-11-15 DIAGNOSIS — R1032 Left lower quadrant pain: Secondary | ICD-10-CM

## 2011-11-15 DIAGNOSIS — R1031 Right lower quadrant pain: Secondary | ICD-10-CM

## 2011-11-15 DIAGNOSIS — R52 Pain, unspecified: Secondary | ICD-10-CM

## 2011-11-15 LAB — CBC WITH DIFFERENTIAL/PLATELET
Basophils Relative: 0.4 % (ref 0.0–3.0)
Hemoglobin: 11.9 g/dL — ABNORMAL LOW (ref 12.0–15.0)
Lymphocytes Relative: 43.1 % (ref 12.0–46.0)
Monocytes Relative: 11.4 % (ref 3.0–12.0)
Neutro Abs: 2.7 10*3/uL (ref 1.4–7.7)
Neutrophils Relative %: 39.4 % — ABNORMAL LOW (ref 43.0–77.0)
RBC: 4.19 Mil/uL (ref 3.87–5.11)
WBC: 6.9 10*3/uL (ref 4.5–10.5)

## 2011-11-15 LAB — HEPATIC FUNCTION PANEL
Albumin: 4.1 g/dL (ref 3.5–5.2)
Alkaline Phosphatase: 50 U/L (ref 39–117)
Total Protein: 7.6 g/dL (ref 6.0–8.3)

## 2011-11-15 LAB — LIPASE: Lipase: 30 U/L (ref 11.0–59.0)

## 2011-11-15 NOTE — Patient Instructions (Signed)
For the next two weeks take dexilant 60 mg one a day in stead of the prilosec. Stop the allegra and try claritin monitor labs

## 2011-11-15 NOTE — Progress Notes (Signed)
Subjective:    Patient ID: Lori [name], female    DOB: January 03, 1941, 71 y.o.   MRN: [id number]  HPI Pt had a severe "bout of" IBS She was found to have significant vit D deficiency The IBS caused lose stools and "buring in stools" The symptoms are improving She has a hx of asymptomatic gall stones.     Review of Systems  Constitutional: Negative for activity change, appetite change and fatigue.  HENT: Negative for ear pain, congestion, neck pain, postnasal drip and sinus pressure.   Eyes: Negative for redness and visual disturbance.  Respiratory: Negative for cough, shortness of breath and wheezing.   Gastrointestinal: Negative for abdominal pain and abdominal distention.  Genitourinary: Negative for dysuria, frequency and menstrual problem.  Musculoskeletal: Negative for myalgias, joint swelling and arthralgias.  Skin: Negative for rash and wound.  Neurological: Negative for dizziness, weakness and headaches.  Hematological: Negative for adenopathy. Does not bruise/bleed easily.  Psychiatric/Behavioral: Negative for disturbed wake/sleep cycle and decreased concentration.   Past Medical History  Diagnosis Date  . Osteopenia   . Vasculitis   . PMR (polymyalgia rheumatica)   . Varicose veins with inflammation   . Allergy   . Low back pain   . Headache   . Hypertension     History   Social History  . Marital Status: Married    Spouse Name: N/A    Number of Children: N/A  . Years of Education: N/A   Occupational History  . retired    Social History Main Topics  . Smoking status: Never Smoker   . Smokeless tobacco: Not on file  . Alcohol Use: No  . Drug Use: No  . Sexually Active: Yes   Other Topics Concern  . Not on file   Social History Narrative  . No narrative on file    Past Surgical History  Procedure Date  . Dilation and curettage of uterus   . Cesarean section   . Tonsillectomy   . Popliteal synovial cyst excision   . Arthroscopic knee     . Carotid artery disection     Carotid Artery Dissection    Family History  Problem Relation Age of Onset  . Multiple myeloma Father     Allergies  Allergen Reactions  . Azithromycin Other (See Comments)    Thrush   . Hydrocodone-Acetaminophen   . Neomycin   . Penicillins     REACTION: Arm swelling  . Pneumococcal Vaccine Polyvalent     Current Outpatient Prescriptions on File Prior to Visit  Medication Sig Dispense Refill  . aspirin 81 MG tablet Take 81 mg by mouth daily.        . betamethasone dipropionate (DIPROLENE) 0.05 % cream Apply topically 2 (two) times daily as needed.      . Calcium Carbonate-Vitamin D (CALCIUM-VITAMIN D) 500-200 MG-UNIT per tablet Take 1 tablet by mouth 2 (two) times daily with a meal.      . clidinium-chlordiazePOXIDE (LIBRAX) 2.5-5 MG per capsule Take 1 capsule by mouth 3 (three) times daily as needed.      . fenofibrate 160 MG tablet TAKE ONE TABLET BY MOUTH EVERY DAY  90 tablet  3  . fluticasone (FLONASE) 50 MCG/ACT nasal spray       . meclizine (ANTIVERT) 25 MG tablet Take 1 tablet (25 mg total) by mouth 3 (three) times daily as needed.  30 tablet  3  . metoprolol (LOPRESSOR) 50 MG tablet Take 25 mg by mouth 2 (  two) times daily.      Marland Kitchen omeprazole (PRILOSEC) 40 MG capsule Take 1 capsule (40 mg total) by mouth daily.  30 capsule  11  . Vitamin D, Ergocalciferol, (DRISDOL) 50000 UNITS CAPS Take 1 capsule (50,000 Units total) by mouth every 7 (seven) days.  15 capsule  3  . Omega-3 Fatty Acids (SEA-OMEGA 30) 1200 MG CAPS Take by mouth daily.        Marland Kitchen DISCONTD: metoprolol (LOPRESSOR) 50 MG tablet TAKE ONE TABLET BY MOUTH EVERY DAY  30 tablet  6    BP 134/70  Pulse 72  Temp 98.2 F (36.8 C)  Resp 16  Ht 5\' 7"  (1.702 m)  Wt 126 lb (57.153 kg)  BMI 19.73 kg/m2        Objective:   Physical Exam  Nursing note and vitals reviewed. Constitutional: She is oriented to person, place, and time. She appears well-developed and well-nourished. No  distress.  HENT:  Head: Normocephalic and atraumatic.  Right Ear: External ear normal.  Left Ear: External ear normal.  Nose: Nose normal.  Mouth/Throat: Oropharynx is clear and moist.  Eyes: Conjunctivae and EOM are normal. Pupils are equal, round, and reactive to light.  Neck: Normal range of motion. Neck supple. No JVD present. No tracheal deviation present. No thyromegaly present.  Cardiovascular: Normal rate, regular rhythm, normal heart sounds and intact distal pulses.   No murmur heard. Pulmonary/Chest: Effort normal and breath sounds normal. She has no wheezes. She exhibits no tenderness.  Abdominal: Soft. Bowel sounds are normal.  Musculoskeletal: Normal range of motion. She exhibits no edema and no tenderness.  Lymphadenopathy:    She has no cervical adenopathy.  Neurological: She is alert and oriented to person, place, and time. She has normal reflexes. No cranial nerve deficit.  Skin: Skin is warm and dry. She is not diaphoretic.  Psychiatric: She has a normal mood and affect. Her behavior is normal.          Assessment & Plan:  IBS flair GERD discussion of replacing PPI with H2 drug monitor labs for abd pain

## 2011-11-17 ENCOUNTER — Telehealth: Payer: Self-pay | Admitting: Internal Medicine

## 2011-11-17 NOTE — Telephone Encounter (Signed)
Pt Requesting results of labs

## 2011-11-17 NOTE — Telephone Encounter (Signed)
Pt informed- all wnl 

## 2011-11-19 ENCOUNTER — Other Ambulatory Visit: Payer: Self-pay | Admitting: Dermatology

## 2011-12-01 ENCOUNTER — Ambulatory Visit (INDEPENDENT_AMBULATORY_CARE_PROVIDER_SITE_OTHER): Payer: Medicare Other | Admitting: Family

## 2011-12-01 ENCOUNTER — Encounter: Payer: Self-pay | Admitting: Family

## 2011-12-01 ENCOUNTER — Telehealth: Payer: Self-pay | Admitting: Internal Medicine

## 2011-12-01 VITALS — BP 140/80 | HR 95 | Temp 98.7°F | Wt 127.0 lb

## 2011-12-01 DIAGNOSIS — K589 Irritable bowel syndrome without diarrhea: Secondary | ICD-10-CM

## 2011-12-01 DIAGNOSIS — K219 Gastro-esophageal reflux disease without esophagitis: Secondary | ICD-10-CM

## 2011-12-01 NOTE — Telephone Encounter (Signed)
Caller: [name]/Patient; Patient Name: Lori [name]; PCP: Darryll Capers (Adults only); Best Callback Phone Number: [phone]; Reason for call: Reports she is on Vitamin D 50,000 weekly  x 4 weeks.  Now she has metallic taste in mouth onset 0/98/11 and 12/01/11 and  mouth feels  very dry. Contacted her pharmacy who advised that it may be due to medication.  Emergent sx ruled out. Home care for the interim and see provider in 72 hours per [facility] protocol.  Appointment with Lubertha Sayres, PA at 16:00 as  none available with Dr. Lovell Sheehan.

## 2011-12-01 NOTE — Telephone Encounter (Signed)
Ok per dr jenkins 

## 2011-12-02 ENCOUNTER — Encounter: Payer: Self-pay | Admitting: Family

## 2011-12-02 NOTE — Progress Notes (Signed)
Subjective:    Patient ID: Lori [name], female    DOB: [birth year], 71 y.o.   MRN: [id number]  HPI 71 year old white female, nonsmoker, patient of Dr. Lovell Sheehan is in today with complaints of a metallic taste in her mouth that began this morning. Patient has concerns that it could be her vitamin D supplement. She's been taken the medication for nearly 5 weeks at this point and has tolerated it well, without side effects. She also saw Dr. Lovell Sheehan on August 26, with reflux and abdominal bloating. At that time, he switched her omeprazole to Dexilant. She took the medication for one week and felt as if he was not helping, and went back to omeprazole 40 mg daily. She denies any heartburn or indigestion, continues to have bloating. Has an increase in the amount of stress in her life. She consumes about 2 cups of coffee a day. Denies any acidic foods or spicy foods.   Review of Systems  Constitutional: Negative.   HENT: Negative.  Negative for congestion, sore throat, rhinorrhea and postnasal drip.        Acidic taste in her mouth  Respiratory: Negative.   Cardiovascular: Negative.   Gastrointestinal: Negative.  Negative for nausea, diarrhea and constipation.  Skin: Negative.   Hematological: Negative.   Psychiatric/Behavioral: Negative.    Past Medical History  Diagnosis Date  . Osteopenia   . Vasculitis   . PMR (polymyalgia rheumatica)   . Varicose veins with inflammation   . Allergy   . Low back pain   . Headache   . Hypertension     History   Social History  . Marital Status: Married    Spouse Name: N/A    Number of Children: N/A  . Years of Education: N/A   Occupational History  . retired    Social History Main Topics  . Smoking status: Never Smoker   . Smokeless tobacco: Not on file  . Alcohol Use: No  . Drug Use: No  . Sexually Active: Yes   Other Topics Concern  . Not on file   Social History Narrative  . No narrative on file    Past Surgical History    Procedure Date  . Dilation and curettage of uterus   . Cesarean section   . Tonsillectomy   . Popliteal synovial cyst excision   . Arthroscopic knee   . Carotid artery disection     Carotid Artery Dissection    Family History  Problem Relation Age of Onset  . Multiple myeloma Father     Allergies  Allergen Reactions  . Azithromycin Other (See Comments)    Thrush   . Hydrocodone-Acetaminophen   . Neomycin   . Penicillins     REACTION: Arm swelling  . Pneumococcal Vaccine Polyvalent     Current Outpatient Prescriptions on File Prior to Visit  Medication Sig Dispense Refill  . aspirin 81 MG tablet Take 81 mg by mouth daily.        . betamethasone dipropionate (DIPROLENE) 0.05 % cream Apply topically 2 (two) times daily as needed.      . Calcium Carbonate-Vitamin D (CALCIUM-VITAMIN D) 500-200 MG-UNIT per tablet Take 1 tablet by mouth 2 (two) times daily with a meal.      . clidinium-chlordiazePOXIDE (LIBRAX) 2.5-5 MG per capsule Take 1 capsule by mouth 3 (three) times daily as needed.      . fenofibrate 160 MG tablet TAKE ONE TABLET BY MOUTH EVERY DAY  90 tablet  3  .  fluticasone (FLONASE) 50 MCG/ACT nasal spray       . meclizine (ANTIVERT) 25 MG tablet Take 1 tablet (25 mg total) by mouth 3 (three) times daily as needed.  30 tablet  3  . metoprolol (LOPRESSOR) 50 MG tablet Take 25 mg by mouth 2 (two) times daily.      . Omega-3 Fatty Acids (SEA-OMEGA 30) 1200 MG CAPS Take by mouth daily.        Marland Kitchen omeprazole (PRILOSEC) 40 MG capsule Take 1 capsule (40 mg total) by mouth daily.  30 capsule  11  . Vitamin D, Ergocalciferol, (DRISDOL) 50000 UNITS CAPS Take 1 capsule (50,000 Units total) by mouth every 7 (seven) days.  15 capsule  3    BP 140/80  Pulse 95  Temp 98.7 F (37.1 C) (Oral)  Wt 127 lb (57.607 kg)  SpO2 96%chart    Objective:   Physical Exam  Constitutional: She appears well-developed and well-nourished.  Neck: Normal range of motion. Neck supple.   Cardiovascular: Normal rate, regular rhythm and normal heart sounds.   Pulmonary/Chest: Effort normal and breath sounds normal.  Abdominal: Soft. Bowel sounds are normal. She exhibits no distension. There is no tenderness. There is no rebound.  Neurological: She is alert.  Skin: Skin is warm and dry.  Psychiatric: She has a normal mood and affect.          Assessment & Plan:  Assessment: GERD-uncontrolled, ear normal bowel syndrome  Plan: Hold omeprazole. Samples of Nexium 40 mg once daily were given. Patient has taken the medication and tolerated it well in the past. We'll recheck her within the next 10 days to be sure that her symptoms have resolved. I don't suspect that the vitamin D is  causing her current symptoms.

## 2011-12-10 ENCOUNTER — Telehealth: Payer: Self-pay | Admitting: Internal Medicine

## 2011-12-10 NOTE — Telephone Encounter (Signed)
Patient continue to c/o a metallic taste in her mouth. We will take a 1 week drug holiday to see if the vitamin d is causing the metallic taste. She will call next week and let us know if she is better.

## 2011-12-10 NOTE — Telephone Encounter (Signed)
Pt called and is req a call back from [employer] re: Nexium.

## 2011-12-17 ENCOUNTER — Telehealth: Payer: Self-pay | Admitting: Family

## 2011-12-17 NOTE — Telephone Encounter (Signed)
Patient took a 1 week drug holiday off of Vitamin D and reports resolution of her reflux symptoms and metallic taste. She will continue to take Vitamin D OTC 800-100 iu daily. We will discontinue vitamin D RX and recheck in December when she sees her PCP, Dr. Lovell Sheehan.

## 2012-01-04 ENCOUNTER — Ambulatory Visit (INDEPENDENT_AMBULATORY_CARE_PROVIDER_SITE_OTHER): Payer: Medicare Other

## 2012-01-04 DIAGNOSIS — Z23 Encounter for immunization: Secondary | ICD-10-CM

## 2012-01-19 ENCOUNTER — Encounter: Payer: Self-pay | Admitting: Family

## 2012-01-19 ENCOUNTER — Ambulatory Visit (INDEPENDENT_AMBULATORY_CARE_PROVIDER_SITE_OTHER): Payer: Medicare Other | Admitting: Family

## 2012-01-19 VITALS — BP 148/78 | HR 95 | Wt 124.0 lb

## 2012-01-19 DIAGNOSIS — J309 Allergic rhinitis, unspecified: Secondary | ICD-10-CM

## 2012-01-19 DIAGNOSIS — K219 Gastro-esophageal reflux disease without esophagitis: Secondary | ICD-10-CM

## 2012-01-19 MED ORDER — ESOMEPRAZOLE MAGNESIUM 40 MG PO CPDR: 40.0000 mg | DELAYED_RELEASE_CAPSULE | Freq: Every day | ORAL | Status: DC

## 2012-01-19 NOTE — Progress Notes (Signed)
Subjective:    Patient ID: Lori [name], female    DOB: 11/02/1940, 71 y.o.   MRN: [id number]  HPI 71 year old white female, nonsmoker, is in today with c/o stuffiness, congestion, especially at night. She has been taking Claritin with no relief. Patient has a cat at home that recently had fleas. She's been using an herbal spray on her skin to help combat the fleas. Patient reports having eucalyptus types smell. She thinks that she may be allergic to it.  Patient reports having more heartburn and indigestion over the last couple days. She's been taken omeprazole 40 mg daily. Continuing to have heartburn and bloatedness. Reports having fried salmon patties for dinner last night.   Review of Systems  Constitutional: Negative.   HENT: Positive for congestion, rhinorrhea and sneezing.   Respiratory: Negative.   Cardiovascular: Negative.   Gastrointestinal: Positive for abdominal pain. Negative for nausea, diarrhea, constipation and blood in stool.       Epigastric pain  Genitourinary: Negative.   Musculoskeletal: Negative.   Skin: Negative.   Neurological: Negative.   Hematological: Negative.   Psychiatric/Behavioral: Negative.    Past Medical History  Diagnosis Date  . Osteopenia   . Vasculitis   . PMR (polymyalgia rheumatica)   . Varicose veins with inflammation   . Allergy   . Low back pain   . Headache   . Hypertension     History   Social History  . Marital Status: Married    Spouse Name: N/A    Number of Children: N/A  . Years of Education: N/A   Occupational History  . retired    Social History Main Topics  . Smoking status: Never Smoker   . Smokeless tobacco: Not on file  . Alcohol Use: No  . Drug Use: No  . Sexually Active: Yes   Other Topics Concern  . Not on file   Social History Narrative  . No narrative on file    Past Surgical History  Procedure Date  . Dilation and curettage of uterus   . Cesarean section   . Tonsillectomy   .  Popliteal synovial cyst excision   . Arthroscopic knee   . Carotid artery disection     Carotid Artery Dissection    Family History  Problem Relation Age of Onset  . Multiple myeloma Father     Allergies  Allergen Reactions  . Azithromycin Other (See Comments)    Thrush   . Hydrocodone-Acetaminophen   . Neomycin   . Penicillins     REACTION: Arm swelling  . Pneumococcal Vaccine Polyvalent     Current Outpatient Prescriptions on File Prior to Visit  Medication Sig Dispense Refill  . aspirin 81 MG tablet Take 81 mg by mouth daily.        . betamethasone dipropionate (DIPROLENE) 0.05 % cream Apply topically 2 (two) times daily as needed.      . Calcium Carbonate-Vitamin D (CALCIUM-VITAMIN D) 500-200 MG-UNIT per tablet Take 1 tablet by mouth 2 (two) times daily with a meal.      . clidinium-chlordiazePOXIDE (LIBRAX) 2.5-5 MG per capsule Take 1 capsule by mouth 3 (three) times daily as needed.      . fenofibrate 160 MG tablet TAKE ONE TABLET BY MOUTH EVERY DAY  90 tablet  3  . fluticasone (FLONASE) 50 MCG/ACT nasal spray       . meclizine (ANTIVERT) 25 MG tablet Take 1 tablet (25 mg total) by mouth 3 (three) times  daily as needed.  30 tablet  3  . metoprolol (LOPRESSOR) 50 MG tablet Take 25 mg by mouth 2 (two) times daily.      . Omega-3 Fatty Acids (SEA-OMEGA 30) 1200 MG CAPS Take by mouth daily.        Marland Kitchen omeprazole (PRILOSEC) 40 MG capsule Take 1 capsule (40 mg total) by mouth daily.  30 capsule  11  . Vitamin D, Ergocalciferol, (DRISDOL) 50000 UNITS CAPS Take 1 capsule (50,000 Units total) by mouth every 7 (seven) days.  15 capsule  3  . esomeprazole (NEXIUM) 40 MG capsule Take 1 capsule (40 mg total) by mouth daily.  30 capsule  3    BP 148/78  Pulse 95  Wt 124 lb (56.246 kg)  SpO2 97%chart    Objective:   Physical Exam  Constitutional: She is oriented to person, place, and time. She appears well-developed and well-nourished.  HENT:  Right Ear: External ear normal.    Left Ear: External ear normal.  Nose: Nose normal.  Mouth/Throat: Oropharynx is clear and moist.  Neck: Normal range of motion. Neck supple.  Cardiovascular: Normal rate, regular rhythm and normal heart sounds.   Pulmonary/Chest: Effort normal and breath sounds normal.  Abdominal: Soft. Bowel sounds are normal.  Neurological: She is alert and oriented to person, place, and time.  Skin: Skin is warm and dry.  Psychiatric: She has a normal mood and affect.          Assessment & Plan:  Assessment: Allergic Rhinitis, GERD  Plan: Stop Claritin, started Zyrtec 10 mg once daily. Stop omeprazole, start Nexium 40 mg once daily. Stop all herbal sprays. Patient call the office symptoms worsen or persist. She has a recheck with her PCP next month. Recheck at that time and sooner when necessary.  Plan:

## 2012-01-19 NOTE — Patient Instructions (Addendum)
Stop Claritin, start Zyrtec once daily at bedtime. Stop Omeprazole, start Nexium.    Gastroesophageal Reflux Disease, Adult Gastroesophageal reflux disease (GERD) happens when acid from your stomach flows up into the esophagus. When acid comes in contact with the esophagus, the acid causes soreness (inflammation) in the esophagus. Over time, GERD may create small holes (ulcers) in the lining of the esophagus. CAUSES   Increased body weight. This puts pressure on the stomach, making acid rise from the stomach into the esophagus.  Smoking. This increases acid production in the stomach.  Drinking alcohol. This causes decreased pressure in the lower esophageal sphincter (valve or ring of muscle between the esophagus and stomach), allowing acid from the stomach into the esophagus.  Late evening meals and a full stomach. This increases pressure and acid production in the stomach.  A malformed lower esophageal sphincter. Sometimes, no cause is found. SYMPTOMS   Burning pain in the lower part of the mid-chest behind the breastbone and in the mid-stomach area. This may occur twice a week or more often.  Trouble swallowing.  Sore throat.  Dry cough.  Asthma-like symptoms including chest tightness, shortness of breath, or wheezing. DIAGNOSIS  Your caregiver may be able to diagnose GERD based on your symptoms. In some cases, X-rays and other tests may be done to check for complications or to check the condition of your stomach and esophagus. TREATMENT  Your caregiver may recommend over-the-counter or prescription medicines to help decrease acid production. Ask your caregiver before starting or adding any new medicines.  HOME CARE INSTRUCTIONS   Change the factors that you can control. Ask your caregiver for guidance concerning weight loss, quitting smoking, and alcohol consumption.  Avoid foods and drinks that make your symptoms worse, such as:  Caffeine or alcoholic  drinks.  Chocolate.  Peppermint or mint flavorings.  Garlic and onions.  Spicy foods.  Citrus fruits, such as oranges, lemons, or limes.  Tomato-based foods such as sauce, chili, salsa, and pizza.  Fried and fatty foods.  Avoid lying down for the 3 hours prior to your bedtime or prior to taking a nap.  Eat small, frequent meals instead of large meals.  Wear loose-fitting clothing. Do not wear anything tight around your waist that causes pressure on your stomach.  Raise the head of your bed 6 to 8 inches with wood blocks to help you sleep. Extra pillows will not help.  Only take over-the-counter or prescription medicines for pain, discomfort, or fever as directed by your caregiver.  Do not take aspirin, ibuprofen, or other nonsteroidal anti-inflammatory drugs (NSAIDs). SEEK IMMEDIATE MEDICAL CARE IF:   You have pain in your arms, neck, jaw, teeth, or back.  Your pain increases or changes in intensity or duration.  You develop nausea, vomiting, or sweating (diaphoresis).  You develop shortness of breath, or you faint.  Your vomit is green, yellow, black, or looks like coffee grounds or blood.  Your stool is red, bloody, or black. These symptoms could be signs of other problems, such as heart disease, gastric bleeding, or esophageal bleeding. MAKE SURE YOU:   Understand these instructions.  Will watch your condition.  Will get help right away if you are not doing well or get worse. Document Released: 12/16/2004 Document Revised: 05/31/2011 Document Reviewed: 09/25/2010 [facility] Patient Information 2013 [place], [place].   Allergic Rhinitis Allergic rhinitis is when the mucous membranes in the nose respond to allergens. Allergens are particles in the air that cause your body to have an  allergic reaction. This causes you to release allergic antibodies. Through a chain of events, these eventually cause you to release histamine into the blood stream (hence the use of  antihistamines). Although meant to be protective to the body, it is this release that causes your discomfort, such as frequent sneezing, congestion and an itchy runny nose.  CAUSES  The pollen allergens may come from grasses, trees, and weeds. This is seasonal allergic rhinitis, or "hay fever." Other allergens cause year-round allergic rhinitis (perennial allergic rhinitis) such as house dust mite allergen, pet dander and mold spores.  SYMPTOMS   Nasal stuffiness (congestion).  Runny, itchy nose with sneezing and tearing of the eyes.  There is often an itching of the mouth, eyes and ears. It cannot be cured, but it can be controlled with medications. DIAGNOSIS  If you are unable to determine the offending allergen, skin or blood testing may find it. TREATMENT   Avoid the allergen.  Medications and allergy shots (immunotherapy) can help.  Hay fever may often be treated with antihistamines in pill or nasal spray forms. Antihistamines block the effects of histamine. There are over-the-counter medicines that may help with nasal congestion and swelling around the eyes. Check with your caregiver before taking or giving this medicine. If the treatment above does not work, there are many new medications your caregiver can prescribe. Stronger medications may be used if initial measures are ineffective. Desensitizing injections can be used if medications and avoidance fails. Desensitization is when a patient is given ongoing shots until the body becomes less sensitive to the allergen. Make sure you follow up with your caregiver if problems continue. SEEK MEDICAL CARE IF:   You develop fever (more than 100.5 F (38.1 C).  You develop a cough that does not stop easily (persistent).  You have shortness of breath.  You start wheezing.  Symptoms interfere with normal daily activities. Document Released: 12/01/2000 Document Revised: 05/31/2011 Document Reviewed: 06/12/2008 [facility] Patient  Information 2013 [place], [place].

## 2012-01-24 ENCOUNTER — Encounter: Payer: Self-pay | Admitting: Internal Medicine

## 2012-01-24 ENCOUNTER — Ambulatory Visit (INDEPENDENT_AMBULATORY_CARE_PROVIDER_SITE_OTHER): Payer: Medicare Other | Admitting: Internal Medicine

## 2012-01-24 VITALS — BP 140/80 | HR 84 | Temp 98.3°F | Wt 124.0 lb

## 2012-01-24 DIAGNOSIS — F418 Other specified anxiety disorders: Secondary | ICD-10-CM

## 2012-01-24 DIAGNOSIS — E559 Vitamin D deficiency, unspecified: Secondary | ICD-10-CM

## 2012-01-24 DIAGNOSIS — T887XXA Unspecified adverse effect of drug or medicament, initial encounter: Secondary | ICD-10-CM

## 2012-01-24 DIAGNOSIS — F411 Generalized anxiety disorder: Secondary | ICD-10-CM

## 2012-01-24 NOTE — Progress Notes (Signed)
Chief Complaint  Patient presents with  . Hot Flashes    Concerned about Vitamin D.  It was low in the past.  Seen [facility] last Wednesday.    HPI: Patient comes in today for SDA for  problem evaluation. PCP has no appt slots today . She is here with her husband . Last ov was with another provider last week for GERD sx but her real concern was continued concern about exposure to an over-the-counter natural insect repellent product. That she used on her lower extremities for about a week from October 13 to the 21st. She still can't smell the product in her sinuses and her husband is also able to smell the product. She is very concerned about how is affecting her health. When she tried an over-the-counter vitamin D supplement she had side effects and also did with a soy-based thousand units over-the-counter. She is stopped the vitamins and the flushing hot flash symptoms have gotten better. She never took the Nexium in his stay on Prilosec. She is aware that this can decrease vitamin D absorption. Vit d level was low a few months ago. See labs. She is due for follow up with Dr. Lovell Sheehan in December about her vitamin D. ROS: See pertinent positives and negatives per HPI.  Past Medical History  Diagnosis Date  . Osteopenia   . Vasculitis   . PMR (polymyalgia rheumatica)   . Varicose veins with inflammation   . Allergy   . Low back pain   . Headache   . Hypertension     Family History  Problem Relation Age of Onset  . Multiple myeloma Father     History   Social History  . Marital Status: Married    Spouse Name: N/A    Number of Children: N/A  . Years of Education: N/A   Occupational History  . retired    Social History Main Topics  . Smoking status: Never Smoker   . Smokeless tobacco: None  . Alcohol Use: No  . Drug Use: No  . Sexually Active: Yes   Other Topics Concern  . None   Social History Narrative  . None    Current outpatient prescriptions:aspirin 81 MG  tablet, Take 81 mg by mouth daily.  , Disp: , Rfl: ;  betamethasone dipropionate (DIPROLENE) 0.05 % cream, Apply topically 2 (two) times daily as needed., Disp: , Rfl: ;  Calcium Carbonate-Vitamin D (CALCIUM-VITAMIN D) 500-200 MG-UNIT per tablet, Take 1 tablet by mouth 2 (two) times daily with a meal., Disp: , Rfl:  clidinium-chlordiazePOXIDE (LIBRAX) 2.5-5 MG per capsule, Take 1 capsule by mouth 3 (three) times daily as needed., Disp: , Rfl: ;  esomeprazole (NEXIUM) 40 MG capsule, Take 1 capsule (40 mg total) by mouth daily., Disp: 30 capsule, Rfl: 3;  fenofibrate 160 MG tablet, TAKE ONE TABLET BY MOUTH EVERY DAY, Disp: 90 tablet, Rfl: 3;  fluticasone (FLONASE) 50 MCG/ACT nasal spray, , Disp: , Rfl:  meclizine (ANTIVERT) 25 MG tablet, Take 1 tablet (25 mg total) by mouth 3 (three) times daily as needed., Disp: 30 tablet, Rfl: 3;  metoprolol (LOPRESSOR) 50 MG tablet, Take 25 mg by mouth 2 (two) times daily., Disp: , Rfl: ;  Omega-3 Fatty Acids (SEA-OMEGA 30) 1200 MG CAPS, Take by mouth daily.  , Disp: , Rfl: ;  omeprazole (PRILOSEC) 40 MG capsule, Take 1 capsule (40 mg total) by mouth daily., Disp: 30 capsule, Rfl: 11  EXAM: BP 140/80  Pulse 84  Temp 98.3  F (36.8 C) (Oral)  Wt 124 lb (56.246 kg)  SpO2 98%  There is no height on file to calculate BMI.  GENERAL: vitals reviewed and listed above, alert, oriented, appears well hydrated and in no acute distress  But obviously anxious here with husband . Cognition intact  HEENT: atraumatic, conjunttiva clear, no obvious abnormalities on inspection of external nose and ears  NECK: no obvious masses on inspection palpation  LUNGS: clear to auscultation bilaterally, no wheezes, rales or rhonchi, good air movement CV: HRRR, no clubbing cyanosis or  peripheral edema nl cap refill   MS: moves all extremities without noticeable focal  abnormality  PSYCH: pleasant and cooperative, no obvious depression  Anxiety but cognition intace Abdomen:  Sof,t normal  bowel sounds without hepatosplenomegaly, no guarding rebound or masses no CVA tenderness No clubbing cyanosis or edema Record review  ASSESSMENT AND PLAN:  Discussed the following assessment and plan: S/e from natural oil based insect repellant and secondary anxiety from this. S/e from high dose vit d product .   Vit d deficiency   At this time  Less is best and no added new products for  now she is trying to optimize vit d intake with almond milk etc.  would wait until the other se are gone before adding anything new.  Pt agrees. Secondary anxiety .  Pt declines medication for this offered and I agree better to do other strategies.   1. Drug side effects    vit d supp and insecticide repellent.   2. Situational anxiety   3. Anxiety state   4. Vitamin D deficiency   Bp was 140/80 repeat  Not the main problem here  -Patient advised to return or notify a doctor immediately if symptoms worsen or persist or new concerns arise.  Patient Instructions  For now do not take vitamin D supplements indication get a side effect from a given 1 to check with her pharmacist also about different forms of vitamin D for the future.  I think the insecticide insect repellent problem will go away even though it is taking quite a while.  Less is best at this time I do not think you are in any serious health risk at this time.   Followup with Dr. Lovell Sheehan in December and see what your vitamin D level is at that time.   Total visit > 50% spent counseling and coordinating care  [facility]

## 2012-01-24 NOTE — Patient Instructions (Signed)
For now do not take vitamin D supplements indication get a side effect from a given 1 to check with her pharmacist also about different forms of vitamin D for the future.  I think the insecticide insect repellent problem will go away even though it is taking quite a while.  Less is best at this time I do not think you are in any serious health risk at this time.   Followup with Dr. Lovell Sheehan in December and see what your vitamin D level is at that time.

## 2012-01-29 ENCOUNTER — Encounter: Payer: Self-pay | Admitting: Internal Medicine

## 2012-01-29 DIAGNOSIS — E559 Vitamin D deficiency, unspecified: Secondary | ICD-10-CM | POA: Insufficient documentation

## 2012-01-29 DIAGNOSIS — T887XXA Unspecified adverse effect of drug or medicament, initial encounter: Secondary | ICD-10-CM | POA: Insufficient documentation

## 2012-01-29 DIAGNOSIS — F418 Other specified anxiety disorders: Secondary | ICD-10-CM | POA: Insufficient documentation

## 2012-02-11 ENCOUNTER — Other Ambulatory Visit: Payer: Self-pay | Admitting: Internal Medicine

## 2012-02-12 ENCOUNTER — Other Ambulatory Visit: Payer: Self-pay | Admitting: Internal Medicine

## 2012-02-18 ENCOUNTER — Encounter: Payer: Self-pay | Admitting: Internal Medicine

## 2012-02-19 ENCOUNTER — Other Ambulatory Visit: Payer: Self-pay | Admitting: Internal Medicine

## 2012-02-28 ENCOUNTER — Encounter: Payer: Self-pay | Admitting: Internal Medicine

## 2012-02-28 ENCOUNTER — Ambulatory Visit (INDEPENDENT_AMBULATORY_CARE_PROVIDER_SITE_OTHER): Payer: Medicare Other | Admitting: Internal Medicine

## 2012-02-28 VITALS — BP 130/80 | HR 72 | Temp 98.2°F | Resp 16 | Ht 67.0 in | Wt 128.0 lb

## 2012-02-28 DIAGNOSIS — E559 Vitamin D deficiency, unspecified: Secondary | ICD-10-CM

## 2012-02-28 DIAGNOSIS — M130 Polyarthritis, unspecified: Secondary | ICD-10-CM

## 2012-02-28 DIAGNOSIS — R7989 Other specified abnormal findings of blood chemistry: Secondary | ICD-10-CM

## 2012-02-28 DIAGNOSIS — R790 Abnormal level of blood mineral: Secondary | ICD-10-CM

## 2012-02-28 LAB — SEDIMENTATION RATE: Sed Rate: 28 mm/hr — ABNORMAL HIGH (ref 0–22)

## 2012-02-28 LAB — CALCIUM: Calcium: 9.4 mg/dL (ref 8.4–10.5)

## 2012-02-28 NOTE — Patient Instructions (Addendum)
If The breast biopsy is positive then I would recommend that you have a CT of the chest in March be canceled and a PET scan be performed to look for metabolic activity in the lung nodule.

## 2012-02-28 NOTE — Progress Notes (Signed)
Subjective:    Patient ID: Lori [name], female    DOB: 1940/06/20, 71 y.o.   MRN: [id number]  HPI Had a mammogram and saw a small area  That requires needle biopsy. Left breast. Dec 11 March has a CT for a spot on the lung? If the biopsy is positive would recommend PET Vitamin D discussion had a reaction to the D She feels that it caused acid reflux and IBS symptoms     Review of Systems  Constitutional: Negative for activity change, appetite change and fatigue.  HENT: Negative for ear pain, congestion, neck pain, postnasal drip and sinus pressure.   Eyes: Negative for redness and visual disturbance.  Respiratory: Negative for cough, shortness of breath and wheezing.   Gastrointestinal: Positive for abdominal pain. Negative for abdominal distention.  Genitourinary: Negative for dysuria, frequency and menstrual problem.  Musculoskeletal: Negative for myalgias, joint swelling and arthralgias.  Skin: Negative for rash and wound.  Neurological: Negative for dizziness, weakness and headaches.  Hematological: Negative for adenopathy. Does not bruise/bleed easily.  Psychiatric/Behavioral: Positive for dysphoric mood. Negative for sleep disturbance and decreased concentration. The patient is nervous/anxious.    Past Medical History  Diagnosis Date  . Osteopenia   . Vasculitis   . PMR (polymyalgia rheumatica)   . Varicose veins with inflammation   . Allergy   . Low back pain   . Headache   . Hypertension     History   Social History  . Marital Status: Married    Spouse Name: N/A    Number of Children: N/A  . Years of Education: N/A   Occupational History  . retired    Social History Main Topics  . Smoking status: Never Smoker   . Smokeless tobacco: Not on file  . Alcohol Use: No  . Drug Use: No  . Sexually Active: Yes   Other Topics Concern  . Not on file   Social History Narrative  . No narrative on file    Past Surgical History  Procedure Date  .  Dilation and curettage of uterus   . Cesarean section   . Tonsillectomy   . Popliteal synovial cyst excision   . Arthroscopic knee   . Carotid artery disection     Carotid Artery Dissection    Family History  Problem Relation Age of Onset  . Multiple myeloma Father     Allergies  Allergen Reactions  . Azithromycin Other (See Comments)    Thrush   . Hydrocodone-Acetaminophen   . Neomycin   . Penicillins     REACTION: Arm swelling  . Pneumococcal Vaccine Polyvalent     Current Outpatient Prescriptions on File Prior to Visit  Medication Sig Dispense Refill  . aspirin 81 MG tablet Take 81 mg by mouth daily.        . betamethasone dipropionate (DIPROLENE) 0.05 % cream APPLY TO SKIN AS DIRECTED  15 g  0  . clidinium-chlordiazePOXIDE (LIBRAX) 2.5-5 MG per capsule Take 1 capsule by mouth 3 (three) times daily as needed.      . fenofibrate 160 MG tablet TAKE ONE TABLET BY MOUTH EVERY DAY  90 tablet  3  . fluticasone (FLONASE) 50 MCG/ACT nasal spray USE TWO SPRAYS IN EACH NOSTRIL ONE TIME PER DAY  16 g  0  . meclizine (ANTIVERT) 25 MG tablet Take 1 tablet (25 mg total) by mouth 3 (three) times daily as needed.  30 tablet  3  . metoprolol (LOPRESSOR) 50 MG tablet  Take 25 mg by mouth 2 (two) times daily.      . Omega-3 Fatty Acids (SEA-OMEGA 30) 1200 MG CAPS Take by mouth daily.        . [DISCONTINUED] metoprolol (LOPRESSOR) 50 MG tablet TAKE ONE TABLET BY MOUTH EVERY DAY  30 tablet  0    BP 130/80  Pulse 72  Temp 98.2 F (36.8 C)  Resp 16  Ht 5\' 7"  (1.702 m)  Wt 128 lb (58.06 kg)  BMI 20.05 kg/m2        Objective:   Physical Exam  Constitutional: She is oriented to person, place, and time. She appears well-developed and well-nourished. No distress.  HENT:  Head: Normocephalic and atraumatic.  Right Ear: External ear normal.  Left Ear: External ear normal.  Nose: Nose normal.  Mouth/Throat: Oropharynx is clear and moist.  Eyes: Conjunctivae normal and EOM are normal.  Pupils are equal, round, and reactive to light.  Neck: Normal range of motion. Neck supple. No JVD present. No tracheal deviation present. No thyromegaly present.  Cardiovascular: Normal rate and regular rhythm.   Murmur heard. Pulmonary/Chest: Effort normal and breath sounds normal. She has no wheezes. She exhibits no tenderness.  Abdominal: Soft. Bowel sounds are normal. There is tenderness.  Musculoskeletal: Normal range of motion. She exhibits no edema and no tenderness.  Lymphadenopathy:    She has no cervical adenopathy.  Neurological: She is alert and oriented to person, place, and time. She has normal reflexes. No cranial nerve deficit.  Skin: Skin is warm and dry. She is not diaphoretic.  Psychiatric: She has a normal mood and affect. Her behavior is normal.          Assessment & Plan:  She has a great deal of medical worries and anxiety She had a reaction to the vitamin D and to a flee spray that was "systemic" and persistent. She had allergies and PND with buriing sinuses at night that has interfered with sleep for "months" She finally used the nasal spray  "soy based allergies" Sun for vitamin d, milk, and cerials   Will check sed rate for hx of PMR with poly articular arthritis

## 2012-03-01 ENCOUNTER — Other Ambulatory Visit: Payer: Self-pay | Admitting: Radiology

## 2012-03-06 ENCOUNTER — Other Ambulatory Visit: Payer: Self-pay | Admitting: Internal Medicine

## 2012-03-14 ENCOUNTER — Encounter: Payer: Self-pay | Admitting: Internal Medicine

## 2012-03-17 ENCOUNTER — Other Ambulatory Visit: Payer: Self-pay | Admitting: Internal Medicine

## 2012-06-02 ENCOUNTER — Encounter: Payer: Self-pay | Admitting: Internal Medicine

## 2012-06-02 ENCOUNTER — Ambulatory Visit (INDEPENDENT_AMBULATORY_CARE_PROVIDER_SITE_OTHER): Payer: Medicare Other | Admitting: Internal Medicine

## 2012-06-02 VITALS — BP 134/76 | HR 64 | Temp 98.2°F | Wt 126.0 lb

## 2012-06-02 DIAGNOSIS — M839 Adult osteomalacia, unspecified: Secondary | ICD-10-CM

## 2012-06-02 DIAGNOSIS — R911 Solitary pulmonary nodule: Secondary | ICD-10-CM

## 2012-06-02 DIAGNOSIS — K219 Gastro-esophageal reflux disease without esophagitis: Secondary | ICD-10-CM

## 2012-06-02 DIAGNOSIS — E559 Vitamin D deficiency, unspecified: Secondary | ICD-10-CM

## 2012-06-02 DIAGNOSIS — H8109 Meniere's disease, unspecified ear: Secondary | ICD-10-CM

## 2012-06-02 DIAGNOSIS — K649 Unspecified hemorrhoids: Secondary | ICD-10-CM

## 2012-06-02 MED ORDER — HYDROCORTISONE ACE-PRAMOXINE 2.5-1 % RE CREA: TOPICAL_CREAM | Freq: Three times a day (TID) | RECTAL | Status: DC

## 2012-06-02 MED ORDER — OMEPRAZOLE 20 MG PO CPDR: 20.0000 mg | DELAYED_RELEASE_CAPSULE | Freq: Every day | ORAL | Status: DC

## 2012-06-02 NOTE — Progress Notes (Signed)
Subjective:    Patient ID: Lori [name], female    DOB: 1941-03-20, 72 y.o.   MRN: [id number]  HPI Follow up for solitary 6 mm nodule with one year CT Discussed reason for declined  pneumonia and shingles die to previous  reactions to immunization  IBS discussed and use of librax ( rare use of librax) Wants to decrease the PPI to 20  Review of Systems  Constitutional: Negative for activity change, appetite change and fatigue.  HENT: Negative for ear pain, congestion, neck pain, postnasal drip and sinus pressure.   Eyes: Negative for redness and visual disturbance.  Respiratory: Negative for cough, shortness of breath and wheezing.   Gastrointestinal: Negative for abdominal pain and abdominal distention.  Genitourinary: Negative for dysuria, frequency and menstrual problem.  Musculoskeletal: Negative for myalgias, joint swelling and arthralgias.  Skin: Negative for rash and wound.  Neurological: Negative for dizziness, weakness and headaches.  Hematological: Negative for adenopathy. Does not bruise/bleed easily.  Psychiatric/Behavioral: Negative for sleep disturbance and decreased concentration.   Past Medical History  Diagnosis Date  . Osteopenia   . Vasculitis   . PMR (polymyalgia rheumatica)   . Varicose veins with inflammation   . Allergy   . Low back pain   . Headache   . Hypertension     History   Social History  . Marital Status: Married    Spouse Name: N/A    Number of Children: N/A  . Years of Education: N/A   Occupational History  . retired    Social History Main Topics  . Smoking status: Never Smoker   . Smokeless tobacco: Not on file  . Alcohol Use: No  . Drug Use: No  . Sexually Active: Yes   Other Topics Concern  . Not on file   Social History Narrative  . No narrative on file    Past Surgical History  Procedure Laterality Date  . Dilation and curettage of uterus    . Cesarean section    . Tonsillectomy    . Popliteal synovial cyst  excision    . Arthroscopic knee    . Carotid artery disection      Carotid Artery Dissection    Family History  Problem Relation Age of Onset  . Multiple myeloma Father     Allergies  Allergen Reactions  . Azithromycin Other (See Comments)    Thrush   . Hydrocodone-Acetaminophen   . Neomycin   . Penicillins     REACTION: Arm swelling  . Pneumococcal Vaccine Polyvalent     Current Outpatient Prescriptions on File Prior to Visit  Medication Sig Dispense Refill  . aspirin 81 MG tablet Take 81 mg by mouth daily.        . betamethasone dipropionate (DIPROLENE) 0.05 % cream APPLY TO SKIN AS DIRECTED  15 g  0  . clidinium-chlordiazePOXIDE (LIBRAX) 2.5-5 MG per capsule Take 1 capsule by mouth 3 (three) times daily as needed.      . fenofibrate 160 MG tablet TAKE ONE TABLET BY MOUTH EVERY DAY  90 tablet  2  . fluticasone (FLONASE) 50 MCG/ACT nasal spray USE TWO SPRAYS IN EACH NOSTRIL ONE TIME PER DAY  16 g  0  . meclizine (ANTIVERT) 25 MG tablet Take 1 tablet (25 mg total) by mouth 3 (three) times daily as needed.  30 tablet  3  . metoprolol (LOPRESSOR) 50 MG tablet TAKE ONE TABLET BY MOUTH EVERY DAY.  30 tablet  0   No  current facility-administered medications on file prior to visit.    BP 134/76  Pulse 64  Temp(Src) 98.2 F (36.8 C) (Oral)  Wt 126 lb (57.153 kg)  BMI 19.73 kg/m2        Objective:   Physical Exam  Nursing note and vitals reviewed. Constitutional: She is oriented to person, place, and time. She appears well-developed and well-nourished. No distress.  HENT:  Head: Normocephalic and atraumatic.  Right Ear: External ear normal.  Left Ear: External ear normal.  Nose: Nose normal.  Mouth/Throat: Oropharynx is clear and moist.  Eyes: Conjunctivae and EOM are normal. Pupils are equal, round, and reactive to light.  Neck: Normal range of motion. Neck supple. No JVD present. No tracheal deviation present. No thyromegaly present.  Cardiovascular: Normal rate  and regular rhythm.   Murmur heard. Pulmonary/Chest: Effort normal and breath sounds normal. She has no wheezes. She exhibits no tenderness.  Abdominal: Soft. Bowel sounds are normal. She exhibits distension. There is tenderness.  Musculoskeletal: Normal range of motion. She exhibits no edema and no tenderness.  Lymphadenopathy:    She has no cervical adenopathy.  Neurological: She is alert and oriented to person, place, and time. She has normal reflexes. No cranial nerve deficit.  Skin: Skin is warm and dry. She is not diaphoretic.  Psychiatric: She has a normal mood and affect. Her behavior is normal.          Assessment & Plan:  IBS and control issues Use of librax discussed Acid reflux and PPI discussed Decreased the PPI to 20mg  of prilosec Stable dizzyness Gluten free insomnia recommend melatonin  CT ordered for single nodule nodule

## 2012-06-02 NOTE — Patient Instructions (Addendum)
Gluten free dietGluten-Free Diet Gluten is a protein found in many grains. Gluten is present in wheat, rye, and barley. Gluten from wheat, rye, and barley protein interferes with the absorption of food in people with gluten sensitivity. It may also cause intestinal injury when eaten by individuals with gluten sensitivity.  A sample piece (biopsy) of the small intestine is usually required for a positive diagnosis of gluten sensitivity. Dietary treatment consists of eliminating foods and food ingredients from wheat, rye, and barley. When these are taken out of the diet completely, most people regain function of the small intestine. Strict compliance is important even during symptom-free periods. People with gluten sensitivity need to be on a gluten-free diet for a lifetime. During the first stages of treatment, some people will also need to restrict dairy products that contain lactose, which is a naturally occurring sugar. Lactose is difficult to absorb when the small intestines are damaged (lactose intolerance).  WHO NEEDS THIS DIET Some people who have certain diseases need to be on a gluten-free diet. These diseases include:  Celiac disease.  Nontropical sprue.  Gluten-sensitive enteropathy.  Dermatitis herpetiformis. SPECIAL NOTES  Read all labels because gluten may have been added as an incidental ingredient. Words to check for on the label include: flour, starch, durum flour, graham flour, phosphated flour, self-rising flour, semolina, farina, modified food starch, cereal, thickening, fillers, emulsifiers, any kind of malt flavoring, and hydrolyzed vegetable protein. A registered dietician can help you identify possible harmful ingredients in the foods you normally eat.  If you are not sure whether an ingredient contains gluten, check with the manufacturer. Note that some manufacturers may change ingredients without notice. Always read labels.  Since flour and cereal products are often used  in the preparation of foods, it is important to be aware of the methods of preparation used, as well as the foods themselves. This is especially true when you are dining out. Starches  Allowed: Only those prepared from arrowroot, corn, potato, rice, and bean flours. Rice wafers(*), pure cornmeal tortillas, popcorn, some crackers, and chips(*). Hot cereals made from cornmeal. Ask your dietician which specific hot and cold cereals are allowed. White or sweet potatoes, yams, hominy, rice or wild rice, and special gluten-free pasta. Some oriental rice noodles or bean noodles.  Avoid: All wheat and rye cereals, wheat germ, barley, bran, graham, malt, bulgur, and millet(-). NOTE: Avoid cereals containing malt as a flavoring, such as rice cereal. Regular noodles, spaghetti, macaroni, and most packaged rice mixes(*). All others containing wheat, rye, or barley. Vegetables  Allowed: All plain, fresh, frozen, or canned vegetables.  Avoid: Creamed vegetables(*) and vegetables canned in sauces(*). Any prepared with wheat, rye, or barley. Fruit  Allowed: All fresh, frozen, canned, or dried fruits. Fruit juices.  Avoid: Thickened or prepared fruits and some pie fillings(*). Meat and Meat Substitutes  Allowed: Meat, fish, poultry, or eggs prepared without added wheat, rye, or barley. Luncheon meat(*), frankfurters(*), and pure meat. All aged cheese and processed cheese products(*). Cottage cheese(+) and cream cheese(+). Dried beans, dried peas, and lentils.  Avoid: Any meat or meat alternate containing wheat, rye, barley, or gluten stabilizers. Bread-containing products, such as Swiss steak, croquettes, and meatloaf. Tuna canned in vegetable broth(*); [place] with HVP injected as part of the basting; any cheese product containing oat gum as an ingredient. Milk  Allowed: Milk. Yogurt made with allowed ingredients(*).  Avoid: Commercial chocolate milk which may have cereal added(*). Malted milk. Soups and  Combination Foods  Allowed: Homemade  broth and soups made with allowed ingredients; some canned or frozen soups are allowed(*). Combination or prepared foods that do not contain gluten(*). Read labels.  Avoid: All soups containing wheat, rye, or barley flour. Bouillon and bouillon cubes that contain hydrolyzed vegetable protein (HVP). Combination or prepared foods that contain gluten(*). Desserts  Allowed:  Custard, some pudding mixes(*), homemade puddings from cornstarch, rice, and tapioca. Gelatin desserts, ices, and sherbet(*). Cake, cookies, and other desserts prepared with allowed flours. Some commercial ice creams(*). Ask your dietician about specific brands of dessert that are allowed.  Avoid: Cakes, cookies, doughnuts, and pastries that are prepared with wheat, rye, or barley flour. Some commercial ice creams(*), ice cream flavors which contain cookies, crumbs, or cheesecake(*). Ice cream cones. All commercially prepared mixes for cakes, cookies, and other desserts(*). Bread pudding and other puddings thickened with flour. Sweets  Allowed: Sugar, honey, syrup(*), molasses, jelly, jam, plain hard candy, marshmallows, gumdrops, homemade candies free from wheat, rye, or barley. Coconut.  Avoid: Commercial candies containing wheat, rye, or barley(*). Certain buttercrunch toffees are dusted with wheat flour. Ask your dietician about specific brands that are not allowed. Chocolate-coated nuts, which are often rolled in flour. Fats and Oils  Allowed: Butter, margarine, vegetable oil, sour cream(+), whipping cream, shortening, lard, cream, mayonnaise(*). Some commercial salad dressings(*). Peanut butter.  Avoid: Some commercial salad dressings(*). Beverages  Allowed: Coffee (regular or decaffeinated), tea, herbal tea (read label to be sure that no wheat flour has been added). Carbonated beverages and some root beers(*). Wine, sake, and distilled spirits, such as gin, vodka, and  whiskey.  Avoid:  Certain cereal beverages. Ask your dietician about specific brands that are not allowed. Beer (unless gluten-free), ale, malted milk, and some root beers, wine, and sake. Condiments/ Miscellaneous  Allowed: Salt, pepper, herbs, spices, extracts, and food colorings. Monosodium glutamate (MSG). Cider, rice, and wine vinegar. Baking soda and baking powder. Certain soy sauces. Ask your dietician about specific brands that are allowed. Nuts, coconut, chocolate, and pure cocoa powder.  Avoid: Some curry powder(*), some dry seasoning mixes(*), some gravy extracts(*), some meat sauces(*), some catsup(*), some prepared mustard(*), horseradish(*), some soy sauce(*), chip dips(*), and some chewing gum(*). Yeast extract (contains barley). Caramel color (may contain malt). Ask your dietician about specific brands of condiments to avoid. Flour and Thickening Agents  Allowed: Arrowroot starch (A); Corn bran (B); Corn flour (B,C,D); Corn germ (B); Cornmeal (B,C,D); Corn starch (A); Potato flour (B,C,E); Potato starch flour (B,C,E); Rice bran (B); Rice flours: Plain, brown (B,C,D,E), and Sweet (A,B,C,F). Rice polish (B,C,G); Soy flour (B,C,G); Tapioca starch (A). The flour and thickening agents described above are good for: (A) Good thickening agent (B) Good when combined with other flours (C) Best combined with milk and eggs in baked products (D) Best in grainy-textured products (E) Produces drier product than other flours (F) Produces moister product than other flours (G) Adds distinct flavor to product. Use in moderation. (*) Check labels and investigate any questionable ingredients.  (-) Additional research is needed before this product can be recommended. (+) Check vegetable gum used. SAMPLE MEAL PLAN Breakfast   Orange juice.  Banana.  Rice or corn cereal.  Toast (gluten-free bread).  Heart-healthy tub margarine.  Jam.  Milk.  Coffee or tea. Lunch  Chicken salad  sandwich (with gluten-free bread and mayonnaise).  Sliced tomatoes.  Heart-healthy tub margarine.  Apple.  Milk.  Coffee or tea. Dinner  [place].  Baked potato.  Broccoli.  Lettuce salad with gluten-free  dressing.  Gluten-free bread.  Custard.  Heart-healthy margarine.  Coffee or tea. These meal plans are provided as samples. Your daily meal plans will vary. Document Released: 03/08/2005 Document Revised: 09/07/2011 Document Reviewed: 04/18/2011 [facility] Patient Information 2013 [place], [place].

## 2012-06-07 ENCOUNTER — Encounter: Payer: Self-pay | Admitting: Internal Medicine

## 2012-06-09 ENCOUNTER — Other Ambulatory Visit (INDEPENDENT_AMBULATORY_CARE_PROVIDER_SITE_OTHER): Payer: Medicare Other

## 2012-06-09 DIAGNOSIS — T887XXA Unspecified adverse effect of drug or medicament, initial encounter: Secondary | ICD-10-CM

## 2012-06-09 LAB — CREATININE, SERUM: Creatinine, Ser: 0.6 mg/dL (ref 0.4–1.2)

## 2012-06-15 ENCOUNTER — Ambulatory Visit (INDEPENDENT_AMBULATORY_CARE_PROVIDER_SITE_OTHER)
Admission: RE | Admit: 2012-06-15 | Discharge: 2012-06-15 | Disposition: A | Discharge status: Home / Self Care | Payer: Medicare Other | Source: Ambulatory Visit | Attending: Internal Medicine | Admitting: Internal Medicine

## 2012-06-15 DIAGNOSIS — R911 Solitary pulmonary nodule: Secondary | ICD-10-CM

## 2012-06-15 MED ORDER — IOHEXOL 300 MG/ML  SOLN
80.0000 mL | Freq: Once | INTRAMUSCULAR | Status: AC | PRN
  Administered 2012-06-15: 80 mL via INTRAVENOUS

## 2012-06-19 ENCOUNTER — Other Ambulatory Visit: Payer: Self-pay | Admitting: *Deleted

## 2012-06-19 ENCOUNTER — Encounter: Payer: Self-pay | Admitting: Internal Medicine

## 2012-06-20 ENCOUNTER — Other Ambulatory Visit: Payer: Self-pay | Admitting: *Deleted

## 2012-06-20 DIAGNOSIS — R935 Abnormal findings on diagnostic imaging of other abdominal regions, including retroperitoneum: Secondary | ICD-10-CM

## 2012-06-24 ENCOUNTER — Ambulatory Visit
Admission: RE | Admit: 2012-06-24 | Discharge: 2012-06-24 | Disposition: A | Discharge status: Home / Self Care | Payer: Medicare Other | Source: Ambulatory Visit | Attending: Internal Medicine | Admitting: Internal Medicine

## 2012-06-24 DIAGNOSIS — R935 Abnormal findings on diagnostic imaging of other abdominal regions, including retroperitoneum: Secondary | ICD-10-CM

## 2012-06-24 MED ORDER — GADOBENATE DIMEGLUMINE 529 MG/ML IV SOLN
10.0000 mL | Freq: Once | INTRAVENOUS | Status: AC | PRN
  Administered 2012-06-24: 10 mL via INTRAVENOUS

## 2012-09-11 ENCOUNTER — Other Ambulatory Visit: Payer: Self-pay | Admitting: Internal Medicine

## 2012-10-02 ENCOUNTER — Ambulatory Visit (INDEPENDENT_AMBULATORY_CARE_PROVIDER_SITE_OTHER): Payer: Medicare Other | Admitting: Internal Medicine

## 2012-10-02 ENCOUNTER — Encounter: Payer: Self-pay | Admitting: Internal Medicine

## 2012-10-02 VITALS — BP 140/80 | HR 72 | Temp 98.6°F | Resp 16 | Ht 67.0 in | Wt 130.0 lb

## 2012-10-02 DIAGNOSIS — Z9109 Other allergy status, other than to drugs and biological substances: Secondary | ICD-10-CM

## 2012-10-02 DIAGNOSIS — E559 Vitamin D deficiency, unspecified: Secondary | ICD-10-CM

## 2012-10-02 DIAGNOSIS — N390 Urinary tract infection, site not specified: Secondary | ICD-10-CM

## 2012-10-02 DIAGNOSIS — H8109 Meniere's disease, unspecified ear: Secondary | ICD-10-CM

## 2012-10-02 DIAGNOSIS — K449 Diaphragmatic hernia without obstruction or gangrene: Secondary | ICD-10-CM

## 2012-10-02 DIAGNOSIS — I8310 Varicose veins of unspecified lower extremity with inflammation: Secondary | ICD-10-CM

## 2012-10-02 LAB — CBC WITH DIFFERENTIAL/PLATELET
Basophils Absolute: 0 10*3/uL (ref 0.0–0.1)
Eosinophils Absolute: 0.5 10*3/uL (ref 0.0–0.7)
HCT: 36.2 % (ref 36.0–46.0)
Hemoglobin: 11.9 g/dL — ABNORMAL LOW (ref 12.0–15.0)
Lymphs Abs: 3.2 10*3/uL (ref 0.7–4.0)
MCHC: 32.9 g/dL (ref 30.0–36.0)
Neutro Abs: 3.5 10*3/uL (ref 1.4–7.7)
RDW: 13.7 % (ref 11.5–14.6)

## 2012-10-02 LAB — BASIC METABOLIC PANEL
Calcium: 9.7 mg/dL (ref 8.4–10.5)
Creatinine, Ser: 0.7 mg/dL (ref 0.4–1.2)
GFR: 93.95 mL/min (ref >60.00)

## 2012-10-02 NOTE — Progress Notes (Signed)
  Subjective:    Patient ID: Lori [name], female    DOB: 1940/09/08, 72 y.o.   MRN: [id number]  HPI Gluten free has helped joints and bowel and has eliminated diet soft drinks Blood pressure stable GERD stable except with diet issues IBS stable Urinary symptoms with one episode of nocturia and no dysuria   Review of Systems  Constitutional: Negative for activity change, appetite change and fatigue.  HENT: Positive for rhinorrhea. Negative for ear pain, congestion, neck pain, postnasal drip and sinus pressure.   Eyes: Negative for redness and visual disturbance.  Respiratory: Positive for cough. Negative for shortness of breath and wheezing.   Gastrointestinal: Positive for constipation. Negative for abdominal pain and abdominal distention.  Genitourinary: Positive for urgency. Negative for dysuria, frequency and menstrual problem.  Musculoskeletal: Negative for myalgias, joint swelling and arthralgias.  Skin: Negative for rash and wound.  Neurological: Negative for dizziness, weakness and headaches.  Hematological: Negative for adenopathy. Does not bruise/bleed easily.  Psychiatric/Behavioral: Negative for sleep disturbance and decreased concentration.       Objective:   Physical Exam  Nursing note and vitals reviewed. Constitutional: She is oriented to person, place, and time. She appears well-developed and well-nourished. No distress.  HENT:  Head: Normocephalic and atraumatic.  Eyes: Conjunctivae and EOM are normal. Pupils are equal, round, and reactive to light.  Neck: Normal range of motion. Neck supple. No JVD present. No tracheal deviation present. No thyromegaly present.  Cardiovascular: Normal rate and regular rhythm.   Murmur heard. Pulmonary/Chest: Effort normal and breath sounds normal. She has no wheezes. She exhibits no tenderness.  Abdominal: Soft. Bowel sounds are normal.  Musculoskeletal: Normal range of motion. She exhibits no edema and no tenderness.   Lymphadenopathy:    She has no cervical adenopathy.  Neurological: She is alert and oriented to person, place, and time. She has normal reflexes. No cranial nerve deficit.  Skin: Skin is warm and dry. She is not diaphoretic.  Psychiatric: She has a normal mood and affect. Her behavior is normal.          Assessment & Plan:  Has been in the sun with vit d and needs level check Allergies flared and claritin "drys me out" Chronic use of nasonex dsicussed IBS stable Chronic UTI monitor UC Monitor vit d with 2000 dose

## 2012-10-02 NOTE — Patient Instructions (Signed)
The patient is instructed to continue all medications as prescribed. Schedule followup with check out clerk upon leaving the clinic  

## 2012-10-03 LAB — VITAMIN D 25 HYDROXY (VIT D DEFICIENCY, FRACTURES): Vit D, 25-Hydroxy: 34 ng/mL (ref 30–89)

## 2012-10-04 LAB — URINE CULTURE: Colony Count: NO GROWTH

## 2012-10-18 ENCOUNTER — Encounter: Payer: Self-pay | Admitting: Gastroenterology

## 2012-12-14 ENCOUNTER — Telehealth: Payer: Self-pay | Admitting: Internal Medicine

## 2012-12-14 MED ORDER — CILIDINIUM-CHLORDIAZEPOXIDE 2.5-5 MG PO CAPS: 1.0000 | ORAL_CAPSULE | Freq: Three times a day (TID) | ORAL | Status: DC | PRN

## 2012-12-14 NOTE — Telephone Encounter (Signed)
Talked with pt and told her we have not received amy chart message from her since [id number] was sent in today

## 2012-12-14 NOTE — Telephone Encounter (Signed)
Pt's spouse came in for an appointment today, but he was not scheduled.  I gave him another appointment card with correct appointment date.  While here he inquired about his wifes rx request that she sent via MyChart.  He wants to confirm that it has been received since he has not heard anything, I did inform him that we ask for at least 3 business days to complete the request. The Rx is for Clidinium-chlordiazePOXIDE 2.5-5 mg, but pt does not want the full rx because she takes it "as needed".  Please advise. Thank you.

## 2012-12-29 ENCOUNTER — Encounter: Payer: Self-pay | Admitting: Gastroenterology

## 2013-01-05 ENCOUNTER — Ambulatory Visit (INDEPENDENT_AMBULATORY_CARE_PROVIDER_SITE_OTHER): Payer: Medicare Other

## 2013-01-05 DIAGNOSIS — Z23 Encounter for immunization: Secondary | ICD-10-CM

## 2013-02-05 ENCOUNTER — Other Ambulatory Visit: Payer: Self-pay | Admitting: Internal Medicine

## 2013-02-19 DIAGNOSIS — D126 Benign neoplasm of colon, unspecified: Secondary | ICD-10-CM

## 2013-02-19 HISTORY — DX: Benign neoplasm of colon, unspecified: D12.6

## 2013-02-27 ENCOUNTER — Ambulatory Visit (AMBULATORY_SURGERY_CENTER): Payer: Self-pay

## 2013-02-27 VITALS — Ht 67.0 in | Wt 128.8 lb

## 2013-02-27 DIAGNOSIS — R1032 Left lower quadrant pain: Secondary | ICD-10-CM

## 2013-02-27 MED ORDER — MOVIPREP 100 G PO SOLR: ORAL | Status: DC

## 2013-02-28 ENCOUNTER — Encounter: Payer: Self-pay | Admitting: Gastroenterology

## 2013-03-03 ENCOUNTER — Other Ambulatory Visit: Payer: Self-pay | Admitting: Internal Medicine

## 2013-03-13 ENCOUNTER — Encounter: Payer: Self-pay | Admitting: Gastroenterology

## 2013-03-13 ENCOUNTER — Ambulatory Visit (AMBULATORY_SURGERY_CENTER): Payer: Medicare Other | Admitting: Gastroenterology

## 2013-03-13 VITALS — BP 159/86 | HR 74 | Temp 96.2°F | Resp 24 | Ht 67.0 in | Wt 128.0 lb

## 2013-03-13 DIAGNOSIS — D126 Benign neoplasm of colon, unspecified: Secondary | ICD-10-CM

## 2013-03-13 DIAGNOSIS — R1032 Left lower quadrant pain: Secondary | ICD-10-CM

## 2013-03-13 DIAGNOSIS — Z1211 Encounter for screening for malignant neoplasm of colon: Secondary | ICD-10-CM

## 2013-03-13 MED ORDER — SODIUM CHLORIDE 0.9 % IV SOLN: 500.0000 mL | INTRAVENOUS | Status: DC

## 2013-03-13 NOTE — Progress Notes (Signed)
Pt has not had abdominal pain in "several weeks"

## 2013-03-13 NOTE — Progress Notes (Signed)
Called to room to assist during endoscopic procedure.  Patient ID and intended procedure confirmed with present staff. Received instructions for my participation in the procedure from the performing physician.  

## 2013-03-13 NOTE — Progress Notes (Signed)
Lidocaine-40mg IV prior to Propofol InductionPropofol given over incremental dosages 

## 2013-03-13 NOTE — Op Note (Signed)
[facility] [address] [place], [address]   COLONOSCOPY PROCEDURE REPORT  PATIENT: Lori [name], Lori [name]  MR#: [id number] BIRTHDATE: 1940/04/12 , 72  yrs. old GENDER: Female ENDOSCOPIST: Meryl Dare, MD, [facility] PROCEDURE DATE:  03/13/2013 PROCEDURE:   Colonoscopy with biopsy and snare polypectomy First Screening Colonoscopy - Avg.  risk and is 50 yrs.  old or older - No.  Prior Negative Screening - Now for repeat screening. 10 or more years since last screening  History of Adenoma - Now for follow-up colonoscopy & has been > or = to 3 yrs.  N/A  Polyps Removed Today? Yes. ASA CLASS:   Class II INDICATIONS:average risk screening. MEDICATIONS: MAC sedation, administered by CRNA and propofol (Diprivan) 150mg  IV DESCRIPTION OF PROCEDURE:   After the risks benefits and alternatives of the procedure were thoroughly explained, informed consent was obtained.  A digital rectal exam revealed no abnormalities of the rectum.   The LB [id number]  endoscope was introduced through the anus and advanced to the cecum, which was identified by both the appendix and ileocecal valve. No adverse events experienced.   The quality of the prep was good, using MoviPrep  The instrument was then slowly withdrawn as the colon was fully examined.  COLON FINDINGS: A sessile polyp measuring 6 mm in size was found in the ascending colon.  A polypectomy was performed with a cold snare.  The resection was complete and the polyp tissue was completely retrieved.   A sessile polyp measuring 4 mm in size was found at the hepatic flexure.  A polypectomy was performed with cold forceps.  The resection was complete and the polyp tissue was completely retrieved.   The colon was otherwise normal.  There was no diverticulosis, inflammation, polyps or cancers unless previously stated.  Retroflexed views revealed small internal hemorrhoids and a hypertrophied anal papilla. The time to  cecum=2 minutes 56 seconds.  Withdrawal time=9 minutes 50 seconds.  The scope was withdrawn and the procedure completed. COMPLICATIONS: There were no complications.  ENDOSCOPIC IMPRESSION: 1.   Sessile polyp measuring 6 mm in the ascending colon; polypectomy performed with a cold snare 2.   Sessile polyp measuring 4 mm at the hepatic flexure; polypectomy performed with cold forceps 3.   Small internal hemorrhoids  RECOMMENDATIONS: 1.  Await pathology results 2.  Repeat colonoscopy in 5 years if polyp(s) adenomatous; otherwise, given your age, you will not need another colonoscopy for colon cancer screening or polyp surveillance.  These types of tests usually stop around the age 82.  eSigned:  Meryl Dare, MD, [facility] 03/13/2013 8:28 AM      PATIENT NAME:  Lori [name], Lori [name] MR#: [id number]

## 2013-03-13 NOTE — Patient Instructions (Signed)
YOU HAD AN ENDOSCOPIC PROCEDURE TODAY AT THE [facility]: Refer to the procedure report that was given to you for any specific questions about what was found during the examination.  If the procedure report does not answer your questions, please call your gastroenterologist to clarify.  If you requested that your care partner not be given the details of your procedure findings, then the procedure report has been included in a sealed envelope for you to review at your convenience later.  YOU SHOULD EXPECT: Some feelings of bloating in the abdomen. Passage of more gas than usual.  Walking can help get rid of the air that was put into your GI tract during the procedure and reduce the bloating. If you had a lower endoscopy (such as a colonoscopy or flexible sigmoidoscopy) you may notice spotting of blood in your stool or on the toilet paper. If you underwent a bowel prep for your procedure, then you may not have a normal bowel movement for a few days.  DIET: Your first meal following the procedure should be a light meal and then it is ok to progress to your normal diet.  A half-sandwich or bowl of soup is an example of a good first meal.  Heavy or fried foods are harder to digest and may make you feel nauseous or bloated.  Likewise meals heavy in dairy and vegetables can cause extra gas to form and this can also increase the bloating.  Drink plenty of fluids but you should avoid alcoholic beverages for 24 hours.  ACTIVITY: Your care partner should take you home directly after the procedure.  You should plan to take it easy, moving slowly for the rest of the day.  You can resume normal activity the day after the procedure however you should NOT DRIVE or use heavy machinery for 24 hours (because of the sedation medicines used during the test).    SYMPTOMS TO REPORT IMMEDIATELY: A gastroenterologist can be reached at any hour.  During normal business hours, 8:30 AM to 5:00 PM Monday through Friday,  call [phone].  After hours and on weekends, please call the GI answering service at [phone] who will take a message and have the physician on call contact you.   Following lower endoscopy (colonoscopy or flexible sigmoidoscopy):  Excessive amounts of blood in the stool  Significant tenderness or worsening of abdominal pains  Swelling of the abdomen that is new, acute  Fever of 100F or higher    FOLLOW UP: If any biopsies were taken you will be contacted by phone or by letter within the next 1-3 weeks.  Call your gastroenterologist if you have not heard about the biopsies in 3 weeks.  Our staff will call the home number listed on your records the next business day following your procedure to check on you and address any questions or concerns that you may have at that time regarding the information given to you following your procedure. This is a courtesy call and so if there is no answer at the home number and we have not heard from you through the emergency physician on call, we will assume that you have returned to your regular daily activities without incident.  SIGNATURES/CONFIDENTIALITY: You and/or your care partner have signed paperwork which will be entered into your electronic medical record.  These signatures attest to the fact that that the information above on your After Visit Summary has been reviewed and is understood.  Full responsibility of the confidentiality   of this discharge information lies with you and/or your care-partner.  Polyp and hemorrhoid information given.  Dr. Russella Dar will advise you of need for any follow-up colonoscopy after pathology report is reviewed.

## 2013-03-14 ENCOUNTER — Telehealth: Payer: Self-pay | Admitting: *Deleted

## 2013-03-14 NOTE — Telephone Encounter (Signed)
  Follow up Call-  Call back number 03/13/2013  Post procedure Call Back phone  # [phone]  Permission to leave phone message Yes     Patient questions:  Do you have a fever, pain , or abdominal swelling? no Pain Score  0 *  Have you tolerated food without any problems? yes  Have you been able to return to your normal activities? yes  Do you have any questions about your discharge instructions: Diet   no Medications  no Follow up visit  no  Do you have questions or concerns about your Care? no  Actions: * If pain score is 4 or above: No action needed, pain <4.

## 2013-03-22 ENCOUNTER — Encounter: Payer: Self-pay | Admitting: Gastroenterology

## 2013-03-28 ENCOUNTER — Other Ambulatory Visit (INDEPENDENT_AMBULATORY_CARE_PROVIDER_SITE_OTHER): Payer: Medicare Other

## 2013-03-28 DIAGNOSIS — E559 Vitamin D deficiency, unspecified: Secondary | ICD-10-CM

## 2013-03-28 DIAGNOSIS — R7309 Other abnormal glucose: Secondary | ICD-10-CM

## 2013-03-28 DIAGNOSIS — Z0000 Encounter for general adult medical examination without abnormal findings: Secondary | ICD-10-CM

## 2013-03-28 DIAGNOSIS — Z136 Encounter for screening for cardiovascular disorders: Secondary | ICD-10-CM

## 2013-03-28 LAB — CBC WITH DIFFERENTIAL/PLATELET
Basophils Absolute: 0 10*3/uL (ref 0.0–0.1)
Basophils Relative: 0.4 % (ref 0.0–3.0)
EOS ABS: 0.3 10*3/uL (ref 0.0–0.7)
Eosinophils Relative: 4.2 % (ref 0.0–5.0)
HCT: 37 % (ref 36.0–46.0)
Hemoglobin: 12.2 g/dL (ref 12.0–15.0)
LYMPHS PCT: 42.4 % (ref 12.0–46.0)
Lymphs Abs: 3.1 10*3/uL (ref 0.7–4.0)
MCHC: 33 g/dL (ref 30.0–36.0)
MCV: 85.7 fl (ref 78.0–100.0)
MONOS PCT: 9.8 % (ref 3.0–12.0)
Monocytes Absolute: 0.7 10*3/uL (ref 0.1–1.0)
NEUTROS PCT: 43.2 % (ref 43.0–77.0)
Neutro Abs: 3.2 10*3/uL (ref 1.4–7.7)
PLATELETS: 353 10*3/uL (ref 150.0–400.0)
RBC: 4.32 Mil/uL (ref 3.87–5.11)
RDW: 14 % (ref 11.5–14.6)
WBC: 7.3 10*3/uL (ref 4.5–10.5)

## 2013-03-28 LAB — BASIC METABOLIC PANEL
BUN: 26 mg/dL — AB (ref 6–23)
CO2: 24 meq/L (ref 19–32)
CREATININE: 0.7 mg/dL (ref 0.4–1.2)
Calcium: 9.6 mg/dL (ref 8.4–10.5)
Chloride: 106 mEq/L (ref 96–112)
GFR: 84.86 mL/min (ref >60.00)
Glucose, Bld: 76 mg/dL (ref 70–99)
Potassium: 4.1 mEq/L (ref 3.5–5.1)
Sodium: 142 mEq/L (ref 135–145)

## 2013-03-28 LAB — POCT URINALYSIS DIPSTICK
Bilirubin, UA: NEGATIVE
Blood, UA: NEGATIVE
GLUCOSE UA: NEGATIVE
Ketones, UA: NEGATIVE
Leukocytes, UA: NEGATIVE
Nitrite, UA: NEGATIVE
Protein, UA: NEGATIVE
Spec Grav, UA: 1.015
UROBILINOGEN UA: 0.2
pH, UA: 7

## 2013-03-28 LAB — HEPATIC FUNCTION PANEL
ALT: 18 U/L (ref 0–35)
AST: 26 U/L (ref 0–37)
Albumin: 4.5 g/dL (ref 3.5–5.2)
Alkaline Phosphatase: 50 U/L (ref 39–117)
BILIRUBIN TOTAL: 0.5 mg/dL (ref 0.3–1.2)
Bilirubin, Direct: 0 mg/dL (ref 0.0–0.3)
Total Protein: 7.8 g/dL (ref 6.0–8.3)

## 2013-03-28 LAB — LIPID PANEL
CHOL/HDL RATIO: 3
Cholesterol: 159 mg/dL (ref 0–200)
HDL: 59.2 mg/dL (ref >39.00)
LDL CALC: 88 mg/dL (ref 0–99)
Triglycerides: 57 mg/dL (ref 0.0–149.0)
VLDL: 11.4 mg/dL (ref 0.0–40.0)

## 2013-03-28 LAB — TSH: TSH: 0.69 u[IU]/mL (ref 0.35–5.50)

## 2013-03-29 LAB — VITAMIN D 25 HYDROXY (VIT D DEFICIENCY, FRACTURES): VIT D 25 HYDROXY: 31 ng/mL (ref 30–89)

## 2013-04-04 ENCOUNTER — Ambulatory Visit (INDEPENDENT_AMBULATORY_CARE_PROVIDER_SITE_OTHER): Payer: Medicare Other | Admitting: Internal Medicine

## 2013-04-04 ENCOUNTER — Encounter: Payer: Self-pay | Admitting: Internal Medicine

## 2013-04-04 ENCOUNTER — Encounter: Payer: Medicare Other | Admitting: Internal Medicine

## 2013-04-04 VITALS — BP 140/80 | HR 76 | Temp 98.0°F | Resp 16 | Ht 67.0 in | Wt 129.0 lb

## 2013-04-04 DIAGNOSIS — Z23 Encounter for immunization: Secondary | ICD-10-CM

## 2013-04-04 DIAGNOSIS — Z0000 Encounter for general adult medical examination without abnormal findings: Secondary | ICD-10-CM

## 2013-04-04 MED ORDER — VITAMIN D 50 MCG (2000 UT) PO TABS: 2000.0000 [IU] | ORAL_TABLET | Freq: Every day | ORAL | Status: DC

## 2013-04-04 NOTE — Progress Notes (Signed)
Pre visit review using our clinic review tool, if applicable. No additional management support is needed unless otherwise documented below in the visit note. 

## 2013-04-04 NOTE — Patient Instructions (Signed)
The patient is instructed to continue all medications as prescribed. Schedule followup with check out clerk upon leaving the clinic  

## 2013-04-04 NOTE — Progress Notes (Signed)
 Subjective:    Patient ID: Lori [name], female    DOB: 04/06/1940, 73 y.o.   MRN: [id number]  HPI CPX Labs in advance   Review of Systems  Constitutional: Negative for activity change, appetite change and fatigue.  HENT: Negative for congestion, ear pain, postnasal drip and sinus pressure.   Eyes: Negative for redness and visual disturbance.  Respiratory: Negative for cough, shortness of breath and wheezing.   Gastrointestinal: Negative for abdominal pain and abdominal distention.  Genitourinary: Negative for dysuria, frequency and menstrual problem.  Musculoskeletal: Negative for arthralgias, joint swelling, myalgias and neck pain.  Skin: Negative for rash and wound.  Neurological: Negative for dizziness, weakness and headaches.  Hematological: Negative for adenopathy. Does not bruise/bleed easily.  Psychiatric/Behavioral: Negative for sleep disturbance and decreased concentration.   Past Medical History  Diagnosis Date  . Osteopenia   . Vasculitis   . PMR (polymyalgia rheumatica)   . Varicose veins with inflammation   . Allergy   . Low back pain   . Headache(784.0)   . Hypertension   . 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 History of IBS     History   Social History  . Marital Status: Married    Spouse Name: N/A    Number of Children: N/A  . Years of Education: N/A   Occupational History  . retired    Social History Main Topics  . Smoking status: Never Smoker   . Smokeless tobacco: Never Used  . Alcohol Use: No  . Drug Use: No  . Sexual Activity: Yes   Other Topics Concern  . Not on file   Social History Narrative  . No narrative on file    Past Surgical History  Procedure Laterality Date  . Dilation and curettage of uterus    . Cesarean section      1 time  . Tonsillectomy    . Popliteal synovial cyst excision      left leg  . Arthroscopic knee      left knee/ torn meniscus  . Carotid artery disection  1988   Carotid Artery Dissection    Family History  Problem Relation Age of Onset  . Multiple myeloma Father   . Kidney disease Brother   . Prostate cancer Brother   . Colon cancer Neg Hx   . Esophageal cancer Neg Hx   . Rectal cancer Neg Hx   . Stomach cancer Neg Hx     Allergies  Allergen Reactions  . Hydrocodone-Acetaminophen     VOMITING  . Azithromycin Other (See Comments)    Thrush   . Neomycin   . Penicillins     REACTION: Arm swelling  . Pneumococcal Vaccine Polyvalent     ARM REDNESS WITH TENDERNESS  . Pneumovax [Pneumococcal Polysaccharides]     Current Outpatient Prescriptions on File Prior to Visit  Medication Sig Dispense Refill  . aspirin 81 MG tablet Take 81 mg by mouth daily.        . betamethasone dipropionate (DIPROLENE) 0.05 % cream APPLY TO SKIN AS DIRECTED  15 g  0  . cholecalciferol (VITAMIN D) 400 UNITS TABS Take 1,000 Units by mouth daily. [place] Vit D 3-Take one daily      . clidinium-chlordiazePOXIDE (LIBRAX) 5-2.5 MG per capsule Take 1 capsule by mouth as needed.      . fenofibrate 160 MG tablet TAKE ONE TABLET BY MOUTH EVERY DAY  90 tablet    0  . fluticasone (FLONASE) 50 MCG/ACT nasal spray as needed.       . meclizine (ANTIVERT) 25 MG tablet Take 1 tablet (25 mg total) by mouth 3 (three) times daily as needed.  30 tablet  3  . metoprolol (LOPRESSOR) 50 MG tablet Take 1/2 tablet bid      . omeprazole (PRILOSEC) 20 MG capsule Take 1 capsule (20 mg total) by mouth daily.  30 capsule  11   No current facility-administered medications on file prior to visit.    BP 140/80  Pulse 76  Temp(Src) 98 F (36.7 C)  Resp 16  Ht 5' 7" (1.702 m)  Wt 129 lb (58.514 kg)  BMI 20.20 kg/m2         Objective:   Physical Exam  Constitutional: She is oriented to person, place, and time. She appears well-developed and well-nourished. No distress.  HENT:  Head: Normocephalic and atraumatic.  Eyes: Conjunctivae and EOM are normal. Pupils are equal,  round, and reactive to light.  Neck: Normal range of motion. Neck supple. No JVD present. No tracheal deviation present. No thyromegaly present.  Cardiovascular: Normal rate, regular rhythm and intact distal pulses.   Murmur heard. Pulmonary/Chest: Effort normal and breath sounds normal. She has no wheezes. She exhibits no tenderness.  Abdominal: Soft. Bowel sounds are normal.  Musculoskeletal: Normal range of motion. She exhibits no edema and no tenderness.  Lymphadenopathy:    She has no cervical adenopathy.  Neurological: She is alert and oriented to person, place, and time. She has normal reflexes. No cranial nerve deficit.  Skin: Skin is warm and dry. She is not diaphoretic.  Psychiatric: She has a normal mood and affect. Her behavior is normal.          Assessment & Plan:   This is a routine physical examination for this healthy  Female. Reviewed all health maintenance protocols including mammography colonoscopy bone density and reviewed appropriate screening labs. Her immunization history was reviewed as well as her current medications and allergies refills of her chronic medications were given and the plan for yearly health maintenance was discussed all orders and referrals were made as appropriate. Vit d   Add 2000iu daily

## 2013-04-17 ENCOUNTER — Encounter: Payer: Self-pay | Admitting: Internal Medicine

## 2013-05-16 ENCOUNTER — Ambulatory Visit: Payer: Medicare Other | Admitting: Family Medicine

## 2013-05-16 ENCOUNTER — Ambulatory Visit (INDEPENDENT_AMBULATORY_CARE_PROVIDER_SITE_OTHER): Payer: Medicare Other | Admitting: Family Medicine

## 2013-05-16 ENCOUNTER — Encounter: Payer: Self-pay | Admitting: Family Medicine

## 2013-05-16 VITALS — BP 140/80 | HR 72 | Temp 97.6°F | Wt 129.0 lb

## 2013-05-16 DIAGNOSIS — K219 Gastro-esophageal reflux disease without esophagitis: Secondary | ICD-10-CM

## 2013-05-16 MED ORDER — ESOMEPRAZOLE MAGNESIUM 40 MG PO CPDR: 40.0000 mg | DELAYED_RELEASE_CAPSULE | Freq: Every day | ORAL | Status: DC

## 2013-05-16 NOTE — Progress Notes (Signed)
Subjective:    Patient ID: Lori [name], female    DOB: March 02, 1941, 73 y.o.   MRN: [id number]  Gastrophageal Reflux She reports no chest pain, no coughing or no nausea.   Patient seen with recent increase GERD symptoms. She has known history of GERD has taken omeprazole 20 mg daily for quite some time. Previously took Nexium which seemed to work per month somewhat better. She's had a few weeks now intermittent epigastric discomfort with radiation substernally. Minimal caffeine use. No alcohol use. Denies any stool changes. No appetite or weight changes. No pain with swallowing. No dysphagia. Denies any chest pains  Past Medical History  Diagnosis Date  . Osteopenia   . Vasculitis   . PMR (polymyalgia rheumatica)   . Varicose veins with inflammation   . Allergy   . Low back pain   . Headache(784.0)   . Hypertension   . 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 History of IBS    Past Surgical History  Procedure Laterality Date  . Dilation and curettage of uterus    . Cesarean section      1 time  . Tonsillectomy    . Popliteal synovial cyst excision      left leg  . Arthroscopic knee      left knee/ torn meniscus  . Carotid artery disection  1988    Carotid Artery Dissection    reports that she has never smoked. She has never used smokeless tobacco. She reports that she does not drink alcohol or use illicit drugs. family history includes Kidney disease in her brother; Multiple myeloma in her father; Prostate cancer in her brother. There is no history of Colon cancer, Esophageal cancer, Rectal cancer, or Stomach cancer. Allergies  Allergen Reactions  . Hydrocodone-Acetaminophen     VOMITING  . Azithromycin Other (See Comments)    Thrush   . Neomycin   . Penicillins     REACTION: Arm swelling  . Pneumococcal Vaccine Polyvalent     ARM REDNESS WITH TENDERNESS  . Pneumovax [Pneumococcal Polysaccharides]       Review of Systems    Constitutional: Negative for fever, chills, appetite change and unexpected weight change.  Respiratory: Negative for cough and shortness of breath.   Cardiovascular: Negative for chest pain.  Gastrointestinal: Negative for nausea and vomiting.       Objective:   Physical Exam  Constitutional: She appears well-developed and well-nourished.  Cardiovascular: Normal rate and regular rhythm.   Pulmonary/Chest: Effort normal. No respiratory distress. She has no wheezes. She has no rales.  Abdominal: Soft. Bowel sounds are normal. She exhibits no distension and no mass. There is no tenderness. There is no rebound and no guarding.          Assessment & Plan:  GERD. Reflux prevention measures discussed. Switch to Nexium 40 mg once daily. Followup with primary in 2-3 weeks if no further improvement

## 2013-05-16 NOTE — Progress Notes (Signed)
Pre visit review using our clinic review tool, if applicable. No additional management support is needed unless otherwise documented below in the visit note. 

## 2013-05-16 NOTE — Patient Instructions (Signed)
Gastroesophageal Reflux Disease, Adult  Gastroesophageal reflux disease (GERD) happens when acid from your stomach flows up into the esophagus. When acid comes in contact with the esophagus, the acid causes soreness (inflammation) in the esophagus. Over time, GERD may create small holes (ulcers) in the lining of the esophagus.  CAUSES   · Increased body weight. This puts pressure on the stomach, making acid rise from the stomach into the esophagus.  · Smoking. This increases acid production in the stomach.  · Drinking alcohol. This causes decreased pressure in the lower esophageal sphincter (valve or ring of muscle between the esophagus and stomach), allowing acid from the stomach into the esophagus.  · Late evening meals and a full stomach. This increases pressure and acid production in the stomach.  · A malformed lower esophageal sphincter.  Sometimes, no cause is found.  SYMPTOMS   · Burning pain in the lower part of the mid-chest behind the breastbone and in the mid-stomach area. This may occur twice a week or more often.  · Trouble swallowing.  · Sore throat.  · Dry cough.  · Asthma-like symptoms including chest tightness, shortness of breath, or wheezing.  DIAGNOSIS   Your caregiver may be able to diagnose GERD based on your symptoms. In some cases, X-rays and other tests may be done to check for complications or to check the condition of your stomach and esophagus.  TREATMENT   Your caregiver may recommend over-the-counter or prescription medicines to help decrease acid production. Ask your caregiver before starting or adding any new medicines.   HOME CARE INSTRUCTIONS   · Change the factors that you can control. Ask your caregiver for guidance concerning weight loss, quitting smoking, and alcohol consumption.  · Avoid foods and drinks that make your symptoms worse, such as:  · Caffeine or alcoholic drinks.  · Chocolate.  · Peppermint or mint flavorings.  · Garlic and onions.  · Spicy foods.  · Citrus fruits,  such as oranges, lemons, or limes.  · Tomato-based foods such as sauce, chili, salsa, and pizza.  · Fried and fatty foods.  · Avoid lying down for the 3 hours prior to your bedtime or prior to taking a nap.  · Eat small, frequent meals instead of large meals.  · Wear loose-fitting clothing. Do not wear anything tight around your waist that causes pressure on your stomach.  · Raise the head of your bed 6 to 8 inches with wood blocks to help you sleep. Extra pillows will not help.  · Only take over-the-counter or prescription medicines for pain, discomfort, or fever as directed by your caregiver.  · Do not take aspirin, ibuprofen, or other nonsteroidal anti-inflammatory drugs (NSAIDs).  SEEK IMMEDIATE MEDICAL CARE IF:   · You have pain in your arms, neck, jaw, teeth, or back.  · Your pain increases or changes in intensity or duration.  · You develop nausea, vomiting, or sweating (diaphoresis).  · You develop shortness of breath, or you faint.  · Your vomit is green, yellow, black, or looks like coffee grounds or blood.  · Your stool is red, bloody, or black.  These symptoms could be signs of other problems, such as heart disease, gastric bleeding, or esophageal bleeding.  MAKE SURE YOU:   · Understand these instructions.  · Will watch your condition.  · Will get help right away if you are not doing well or get worse.  Document Released: 12/16/2004 Document Revised: 05/31/2011 Document Reviewed: 09/25/2010  [employer]® Patient   Information ©2014 [employer].

## 2013-05-29 ENCOUNTER — Telehealth: Payer: Self-pay

## 2013-05-29 ENCOUNTER — Encounter: Payer: Self-pay | Admitting: Family Medicine

## 2013-05-29 ENCOUNTER — Ambulatory Visit (INDEPENDENT_AMBULATORY_CARE_PROVIDER_SITE_OTHER): Payer: Medicare Other | Admitting: Family Medicine

## 2013-05-29 VITALS — BP 120/84 | Temp 98.0°F | Wt 126.0 lb

## 2013-05-29 DIAGNOSIS — K219 Gastro-esophageal reflux disease without esophagitis: Secondary | ICD-10-CM

## 2013-05-29 MED ORDER — OMEPRAZOLE 40 MG PO CPDR: 40.0000 mg | DELAYED_RELEASE_CAPSULE | Freq: Every day | ORAL | Status: DC

## 2013-05-29 NOTE — Telephone Encounter (Signed)
Received a vm from pt stating at her appt she lost a "gold earring'.  Looked in the room pt was in and around the weighing station.  No earring found.  Called pt to make aware but vm is full. Will attempt to call at a later time.

## 2013-05-29 NOTE — Patient Instructions (Signed)
-  continue the prilosec 40 mg daily and the diet  -call your gastroenterologist for follow up visit

## 2013-05-29 NOTE — Progress Notes (Signed)
Pre visit review using our clinic review tool, if applicable. No additional management support is needed unless otherwise documented below in the visit note. 

## 2013-05-29 NOTE — Progress Notes (Signed)
Chief Complaint  Patient presents with  . Follow-up    acid reflux     HPI:  Lori [name] is a 73 yo pt of Dr. Jenkins here for follow up for GERD: -long hx of GERD and hiatal hernia -saw PCP and Dr. Burchette recently and switch from 20mg prilosec to 40 mg nexium - but she could not afford nexium so she is take 40mg daily of prilosec in the morning -reports: had EGD many years ago with Dr. Stark and has hiatal hernia and has struggled with flares with her reflux for many years; reports progressively getting worse (acid reflux, sinus issues) -worse with large meals and sleeping lying down -the prilosec 40mg in the morning is working fairly well but has a few episodes of bad reflux per week -denies: vomiting, melena, hematochezia, fevers, malaise, swallowing issues, choking -wants referral back to see Dr. Stark  ROS: See pertinent positives and negatives per HPI.  Past Medical History  Diagnosis Date  . Osteopenia   . Vasculitis   . PMR (polymyalgia rheumatica)   . Varicose veins with inflammation   . Allergy   . Low back pain   . Headache(784.0)   . Hypertension   . 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 History of IBS     Past Surgical History  Procedure Laterality Date  . Dilation and curettage of uterus    . Cesarean section      1 time  . Tonsillectomy    . Popliteal synovial cyst excision      left leg  . Arthroscopic knee      left knee/ torn meniscus  . Carotid artery disection  1988    Carotid Artery Dissection    Family History  Problem Relation Age of Onset  . Multiple myeloma Father   . Kidney disease Brother   . Prostate cancer Brother   . Colon cancer Neg Hx   . Esophageal cancer Neg Hx   . Rectal cancer Neg Hx   . Stomach cancer Neg Hx     History   Social History  . Marital Status: Married    Spouse Name: N/A    Number of Children: N/A  . Years of Education: N/A   Occupational History  . retired    Social History  Main Topics  . Smoking status: Never Smoker   . Smokeless tobacco: Never Used  . Alcohol Use: No  . Drug Use: No  . Sexual Activity: Yes   Other Topics Concern  . None   Social History Narrative  . None    Current outpatient prescriptions:aspirin 81 MG tablet, Take 81 mg by mouth daily.  , Disp: , Rfl: ;  betamethasone dipropionate (DIPROLENE) 0.05 % cream, APPLY TO SKIN AS DIRECTED, Disp: 15 g, Rfl: 0;  cholecalciferol 2000 UNITS tablet, Take 1 tablet (2,000 Units total) by mouth daily. [place] Vit D 3-Take one daily, Disp: 30 each, Rfl:  clidinium-chlordiazePOXIDE (LIBRAX) 5-2.5 MG per capsule, Take 1 capsule by mouth as needed., Disp: , Rfl: ;  fenofibrate 160 MG tablet, TAKE ONE TABLET BY MOUTH EVERY DAY, Disp: 90 tablet, Rfl: 0;  fluticasone (FLONASE) 50 MCG/ACT nasal spray, as needed. , Disp: , Rfl: ;  meclizine (ANTIVERT) 25 MG tablet, Take 1 tablet (25 mg total) by mouth 3 (three) times daily as needed., Disp: 30 tablet, Rfl: 3 metoprolol (LOPRESSOR) 50 MG tablet, Take 1/2 tablet bid, Disp: , Rfl: ;  esomeprazole (  NEXIUM) 40 MG capsule, Take 1 capsule (40 mg total) by mouth daily., Disp: 30 capsule, Rfl: 3;  omeprazole (PRILOSEC) 40 MG capsule, Take 1 capsule (40 mg total) by mouth daily., Disp: 30 capsule, Rfl: 1  EXAM:  Filed Vitals:   05/29/13 0954  BP: 120/84  Temp: 98 F (36.7 C)    Body mass index is 19.73 kg/(m^2).  GENERAL: vitals reviewed and listed above, alert, oriented, appears well hydrated and in no acute distress  HEENT: atraumatic, conjunttiva clear, no obvious abnormalities on inspection of external nose and ears  NECK: no obvious masses on inspection  LUNGS: clear to auscultation bilaterally, no wheezes, rales or rhonchi, good air movement  CV: HRRR, no peripheral edema  ABD: BS+, soft, NTTP  MS: moves all extremities without noticeable abnormality  PSYCH: pleasant and cooperative, no obvious depression or anxiety  ASSESSMENT AND  PLAN:  Discussed the following assessment and plan:  GERD (gastroesophageal reflux disease) - Plan: omeprazole (PRILOSEC) 40 MG capsule, CANCELED: Ambulatory referral to Gastroenterology  -discussed options for her worsening GERD despite 40mg prilosec daily -she wants to see gastroenterolgist which I feel is wise - I do not think she needs referral as reports she was seen in Dr. Stark's office in Dec per her report -rx for prilosec 40mg per her request as she reports the nexium is too expensive for her -Patient advised to return or notify a doctor immediately if symptoms worsen or persist or new concerns arise.  Patient Instructions  -continue the prilosec 40 mg daily and the diet  -call your gastroenterologist for follow up visit     ,  R.   

## 2013-06-13 ENCOUNTER — Encounter: Payer: Self-pay | Admitting: Gastroenterology

## 2013-06-13 ENCOUNTER — Ambulatory Visit (INDEPENDENT_AMBULATORY_CARE_PROVIDER_SITE_OTHER): Payer: Medicare Other | Admitting: Gastroenterology

## 2013-06-13 VITALS — BP 136/70 | HR 64 | Ht 65.0 in | Wt 125.4 lb

## 2013-06-13 DIAGNOSIS — K219 Gastro-esophageal reflux disease without esophagitis: Secondary | ICD-10-CM

## 2013-06-13 MED ORDER — PANTOPRAZOLE SODIUM 40 MG PO TBEC: 40.0000 mg | DELAYED_RELEASE_TABLET | Freq: Every day | ORAL | Status: DC

## 2013-06-13 NOTE — Patient Instructions (Signed)
We have sent the following medications to your pharmacy for you to pick up at your convenience: Pantoprazole.  Patient advised to avoid spicy, acidic, citrus, chocolate, mints, fruit and fruit juices.  Limit the intake of caffeine, alcohol and Soda.  Don't exercise too soon after eating.  Don't lie down within 3-4 hours of eating.  Elevate the head of your bed.  Please call back in 4-6 weeks with update on your symptoms if they have not improved.  Thank you for choosing me and [facility].  Pricilla Riffle. Dagoberto Ligas., MD., [facility] Regal

## 2013-06-13 NOTE — Progress Notes (Signed)
    History of Present Illness: This is a 73 year old female with a long history of GERD. She underwent upper endoscopy in 2000 showing a hiatal hernia. Her reflux had been well-controlled on omeprazole 20 mg daily but has flared recently. She was recommended to take Nexium 40 mg daily for cost too much for her. She took omeprazole 40 mg daily without complete relief of symptoms.  Current Medications, Allergies, Past Medical History, Past Surgical History, Family History and Social History were reviewed in [employer] record.  Physical Exam: General: Well developed , well nourished, no acute distress Head: Normocephalic and atraumatic Eyes:  sclerae anicteric, EOMI Ears: Normal auditory acuity Mouth: No deformity or lesions Lungs: Clear throughout to auscultation Heart: Regular rate and rhythm; no murmurs, rubs or bruits Abdomen: Soft, non tender and non distended. No masses, hepatosplenomegaly or hernias noted. Normal Bowel sounds Musculoskeletal: Symmetrical with no gross deformities  Pulses:  Normal pulses noted Extremities: No clubbing, cyanosis, edema or deformities noted Neurological: Alert oriented x 4, grossly nonfocal Psychological:  Alert and cooperative. Normal mood and affect  Assessment and Recommendations:  1. GERD. Standard antireflux measures. Begin pantoprazole 40 mg daily. Her symptoms do not come under good control for the next 6 weeks consider increasing pantoprazole to twice a day and consider upper endoscopy.

## 2013-06-14 ENCOUNTER — Telehealth: Payer: Self-pay | Admitting: Gastroenterology

## 2013-06-14 NOTE — Telephone Encounter (Signed)
OK. Increase to pantoprazole 40 mg bid with 1 year of refills

## 2013-06-14 NOTE — Telephone Encounter (Signed)
Patient states she took her pantoprazole this morning and several hours later starting having some reflux symptoms. Patient states she took another pantoprazole which has helped and wonders if she can take pantoprazole twice daily just until her reflux symptoms are under control. She states she is on continuing the reflux diet also. Told patient that we wanted her try the pantoprazole once daily for several weeks before calling with an update on her symptoms. Told her she needs to give the medicine more time. Pt verbalized understanding. Told patient I will send this to Dr. Fuller Plan to make sure there is nothing further we can recommend and she will call back in a few weeks with an update if she does not here from our office.

## 2013-06-15 MED ORDER — PANTOPRAZOLE SODIUM 40 MG PO TBEC
40.0000 mg | DELAYED_RELEASE_TABLET | Freq: Two times a day (BID) | ORAL | Status: DC
Start: 2013-06-15 — End: 2013-07-09

## 2013-06-15 NOTE — Telephone Encounter (Signed)
Prescription sent to patient's pharmacy. Patient told to take pantoprazole 30 minutes before breakfast and dinner and to only call if she has further reflux symptoms. Pt agreed.

## 2013-07-02 ENCOUNTER — Other Ambulatory Visit: Payer: Self-pay | Admitting: Internal Medicine

## 2013-07-06 ENCOUNTER — Telehealth: Payer: Self-pay | Admitting: Gastroenterology

## 2013-07-06 NOTE — Telephone Encounter (Signed)
Patient reports she is having sinus drainage and mucus in her throat and feels it is from her GERD.  She is advised unlikely to be from GERD.  She is advised to try OTC antihistamines for sinus drainage.  She will cal back for additional questions or concerns

## 2013-07-09 ENCOUNTER — Encounter: Payer: Self-pay | Admitting: Internal Medicine

## 2013-07-09 ENCOUNTER — Ambulatory Visit (INDEPENDENT_AMBULATORY_CARE_PROVIDER_SITE_OTHER): Payer: Medicare Other | Admitting: Internal Medicine

## 2013-07-09 VITALS — BP 120/78 | HR 68 | Temp 98.0°F | Ht 67.0 in | Wt 121.0 lb

## 2013-07-09 DIAGNOSIS — G8929 Other chronic pain: Secondary | ICD-10-CM

## 2013-07-09 DIAGNOSIS — R1013 Epigastric pain: Secondary | ICD-10-CM

## 2013-07-09 DIAGNOSIS — K219 Gastro-esophageal reflux disease without esophagitis: Secondary | ICD-10-CM

## 2013-07-09 MED ORDER — PANTOPRAZOLE SODIUM 40 MG PO TBEC: 40.0000 mg | DELAYED_RELEASE_TABLET | Freq: Every morning | ORAL | Status: DC

## 2013-07-09 MED ORDER — FAMOTIDINE 40 MG PO TABS: 40.0000 mg | ORAL_TABLET | Freq: Every day | ORAL | Status: DC

## 2013-07-09 NOTE — Progress Notes (Signed)
Pre visit review using our clinic review tool, if applicable. No additional management support is needed unless otherwise documented below in the visit note. 

## 2013-07-09 NOTE — Progress Notes (Signed)
Subjective:    Patient ID: Lori [name], female    DOB: May 31, 1940, 73 y.o.   MRN: [id number]  Gastrophageal Reflux She complains of abdominal pain and nausea. She reports no coughing or no wheezing. Associated symptoms include fatigue.   Follow up after seeing Dr Fuller Plan. She had been using the Efudex for skin cancer and has mucocutaneous side effects. Increased stress. Recommmend not using the topical cancer drugs Nexium too expensive Took prilosec and did not help.     Review of Systems  Constitutional: Positive for fatigue. Negative for activity change.  HENT: Positive for congestion. Negative for ear pain, postnasal drip and sinus pressure.   Eyes: Negative for redness and visual disturbance.  Respiratory: Negative for cough, shortness of breath and wheezing.   Gastrointestinal: Positive for nausea and abdominal pain. Negative for abdominal distention.  Genitourinary: Negative for dysuria, frequency and menstrual problem.  Musculoskeletal: Negative for arthralgias, joint swelling, myalgias and neck pain.  Skin: Positive for color change and rash. Negative for wound.  Neurological: Positive for weakness. Negative for dizziness and headaches.  Hematological: Negative for adenopathy. Does not bruise/bleed easily.  Psychiatric/Behavioral: Negative for sleep disturbance and decreased concentration.   Past Medical History  Diagnosis Date  . Osteopenia   . Vasculitis   . PMR (polymyalgia rheumatica)   . Varicose veins with inflammation   . Allergy   . Low back pain   . Headache(784.0)   . Hypertension   . 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 History of IBS   . Tubular adenoma of colon 02/2013    History   Social History  . Marital Status: Married    Spouse Name: N/A    Number of Children: N/A  . Years of Education: N/A   Occupational History  . retired    Social History Main Topics  . Smoking status: Never Smoker   . Smokeless  tobacco: Never Used  . Alcohol Use: No  . Drug Use: No  . Sexual Activity: Yes   Other Topics Concern  . Not on file   Social History Narrative  . No narrative on file    Past Surgical History  Procedure Laterality Date  . Dilation and curettage of uterus    . Cesarean section      1 time  . Tonsillectomy    . Popliteal synovial cyst excision      left leg  . Arthroscopic knee      left knee/ torn meniscus  . Carotid artery disection  1988    Carotid Artery Dissection    Family History  Problem Relation Age of Onset  . Multiple myeloma Father   . Kidney disease Brother   . Prostate cancer Brother   . Colon cancer Neg Hx   . Esophageal cancer Neg Hx   . Rectal cancer Neg Hx   . Stomach cancer Neg Hx     Allergies  Allergen Reactions  . Hydrocodone-Acetaminophen     VOMITING  . Azithromycin Other (See Comments)    Thrush   . Neomycin   . Penicillins     REACTION: Arm swelling  . Pneumococcal Vaccine Polyvalent     ARM REDNESS WITH TENDERNESS  . Pneumovax [Pneumococcal Polysaccharides]     Current Outpatient Prescriptions on File Prior to Visit  Medication Sig Dispense Refill  . aspirin 81 MG tablet Take 81 mg by mouth daily.        . betamethasone  dipropionate (DIPROLENE) 0.05 % cream APPLY TO SKIN AS DIRECTED  15 g  0  . clidinium-chlordiazePOXIDE (LIBRAX) 5-2.5 MG per capsule Take 1 capsule by mouth as needed.      . fenofibrate 160 MG tablet TAKE ONE TABLET BY MOUTH ONCE DAILY.  90 tablet  0  . fluticasone (FLONASE) 50 MCG/ACT nasal spray as needed.       . meclizine (ANTIVERT) 25 MG tablet Take 1 tablet (25 mg total) by mouth 3 (three) times daily as needed.  30 tablet  3  . metoprolol (LOPRESSOR) 50 MG tablet Take 1/2 tablet bid      . pantoprazole (PROTONIX) 40 MG tablet Take 1 tablet (40 mg total) by mouth 2 (two) times daily.  60 tablet  11   No current facility-administered medications on file prior to visit.    BP 120/78  Pulse 68  Temp(Src)  98 F (36.7 C) (Oral)  Ht $R'5\' 7"'Vu$  (1.702 m)  Wt 121 lb (54.885 kg)  BMI 18.95 kg/m2  SpO2 97%       Objective:   Physical Exam  Constitutional: She is oriented to person, place, and time. She appears well-developed and well-nourished. No distress.  HENT:  Head: Normocephalic and atraumatic.  Mouth/Throat: Oropharynx is clear and moist.  Eyes: Conjunctivae and EOM are normal. Pupils are equal, round, and reactive to light.  Neck: Normal range of motion. Neck supple. No JVD present. No tracheal deviation present. No thyromegaly present.  Cardiovascular: Normal rate and regular rhythm.   Murmur heard. Pulmonary/Chest: Effort normal and breath sounds normal. She has no wheezes. She exhibits no tenderness.  Abdominal: Soft. Bowel sounds are normal. She exhibits distension. There is tenderness.  Musculoskeletal: Normal range of motion. She exhibits edema and tenderness.  Lymphadenopathy:    She has no cervical adenopathy.  Neurological: She is alert and oriented to person, place, and time. She has normal reflexes. No cranial nerve deficit.  Skin: She is not diaphoretic. There is erythema.  Psychiatric: She has a normal mood and affect. Her behavior is normal.          Assessment & Plan:  On protonix and this has helped but concerned about long term use Will use pepcid  40  Every night and protonix every other day  Has appointment for June 1

## 2013-07-09 NOTE — Patient Instructions (Signed)
protonix  In the AM and pepcid 40 every night

## 2013-07-12 ENCOUNTER — Ambulatory Visit (INDEPENDENT_AMBULATORY_CARE_PROVIDER_SITE_OTHER): Payer: Medicare Other | Admitting: Family Medicine

## 2013-07-12 ENCOUNTER — Encounter: Payer: Self-pay | Admitting: Family Medicine

## 2013-07-12 VITALS — BP 130/70 | HR 72 | Temp 98.0°F | Ht 67.0 in | Wt 121.4 lb

## 2013-07-12 DIAGNOSIS — K449 Diaphragmatic hernia without obstruction or gangrene: Secondary | ICD-10-CM

## 2013-07-12 DIAGNOSIS — K219 Gastro-esophageal reflux disease without esophagitis: Secondary | ICD-10-CM

## 2013-07-12 DIAGNOSIS — T887XXA Unspecified adverse effect of drug or medicament, initial encounter: Secondary | ICD-10-CM

## 2013-07-12 NOTE — Progress Notes (Signed)
Pre visit review using our clinic review tool, if applicable. No additional management support is needed unless otherwise documented below in the visit note. 

## 2013-07-12 NOTE — Patient Instructions (Addendum)
-  go back to the pantoprazole twice daily  -see gi as scheduled

## 2013-07-12 NOTE — Progress Notes (Signed)
No chief complaint on file.   HPI:  Acute visit for Acid Reflux: -referred to GI and pantoprozole bid advised 1 month ago and told to follow up in 1 month and has modified diet -saw Dr. Arnoldo Morale 3 days ago because not much better and told to take pepcid 40 mg nightly and protonix at night - but feels worse with this regimen -but took the pantoprazole last night and feels better so wants to go back to this bid -still with acid reflux and heartburn and nausea and wants to see her gastroenterologist but wants [place] to call to see if can get an appointment sooner -she thinks this bad GERD is a medication side effect from topical dermatological cream she used 3 months ago  ROS: See pertinent positives and negatives per HPI.  Past Medical History  Diagnosis Date  . Osteopenia   . Vasculitis   . PMR (polymyalgia rheumatica)   . Varicose veins with inflammation   . Allergy   . Low back pain   . Headache(784.0)   . Hypertension   . 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 History of IBS   . Tubular adenoma of colon 02/2013    Past Surgical History  Procedure Laterality Date  . Dilation and curettage of uterus    . Cesarean section      1 time  . Tonsillectomy    . Popliteal synovial cyst excision      left leg  . Arthroscopic knee      left knee/ torn meniscus  . Carotid artery disection  1988    Carotid Artery Dissection    Family History  Problem Relation Age of Onset  . Multiple myeloma Father   . Kidney disease Brother   . Prostate cancer Brother   . Colon cancer Neg Hx   . Esophageal cancer Neg Hx   . Rectal cancer Neg Hx   . Stomach cancer Neg Hx     History   Social History  . Marital Status: Married    Spouse Name: N/A    Number of Children: N/A  . Years of Education: N/A   Occupational History  . retired    Social History Main Topics  . Smoking status: Never Smoker   . Smokeless tobacco: Never Used  . Alcohol Use: No  . Drug Use: No   . Sexual Activity: Yes   Other Topics Concern  . None   Social History Narrative  . None    Current outpatient prescriptions:aspirin 81 MG tablet, Take 81 mg by mouth daily.  , Disp: , Rfl: ;  betamethasone dipropionate (DIPROLENE) 0.05 % cream, APPLY TO SKIN AS DIRECTED, Disp: 15 g, Rfl: 0;  Cholecalciferol (VITAMIN D) 2000 UNITS tablet, Take 20,000 Units by mouth daily. [place] Vit D 3-Take one daily, Disp: , Rfl: ;  clidinium-chlordiazePOXIDE (LIBRAX) 5-2.5 MG per capsule, Take 1 capsule by mouth as needed., Disp: , Rfl:  famotidine (PEPCID) 40 MG tablet, Take 1 tablet (40 mg total) by mouth at bedtime., Disp: 30 tablet, Rfl: 11;  fenofibrate 160 MG tablet, TAKE ONE TABLET BY MOUTH ONCE DAILY., Disp: 90 tablet, Rfl: 0;  fluticasone (FLONASE) 50 MCG/ACT nasal spray, as needed. , Disp: , Rfl: ;  meclizine (ANTIVERT) 25 MG tablet, Take 1 tablet (25 mg total) by mouth 3 (three) times daily as needed., Disp: 30 tablet, Rfl: 3 metoprolol (LOPRESSOR) 50 MG tablet, Take 1/2 tablet bid, Disp: , Rfl: ;  pantoprazole (PROTONIX) 40 MG tablet, Take 40 mg by mouth 2 (two) times daily., Disp: , Rfl:   EXAM:  Filed Vitals:   07/12/13 1346  BP: 130/70  Pulse: 72  Temp: 98 F (36.7 C)    Body mass index is 19.01 kg/(m^2).  GENERAL: vitals reviewed and listed above, alert, oriented, appears well hydrated and in no acute distress  HEENT: atraumatic, conjunttiva clear, no obvious abnormalities on inspection of external nose and ears  NECK: no obvious masses on inspection  LUNGS: clear to auscultation bilaterally, no wheezes, rales or rhonchi, good air movement  CV: HRRR, no peripheral edema  ABD: BS+, soft, NTTP  MS: moves all extremities without noticeable abnormality  PSYCH: pleasant and cooperative, no obvious depression or anxiety  ASSESSMENT AND PLAN:  Discussed the following assessment and plan:  GERD (gastroesophageal reflux disease)  Drug side effects  HIATAL HERNIA  WITH REFLUX  -had my assistant call her GI office and they have scheduled her an appointment - information provided -Patient advised to return or notify a doctor immediately if symptoms worsen or persist or new concerns arise.  Patient Instructions  -go back to the pantoprazole twice daily  -call your gastroenterologist today and you likely will need an endoscopy       Lori [name]

## 2013-07-14 ENCOUNTER — Encounter: Payer: Self-pay | Admitting: Internal Medicine

## 2013-07-23 ENCOUNTER — Encounter: Payer: Self-pay | Admitting: Gastroenterology

## 2013-07-23 ENCOUNTER — Ambulatory Visit (INDEPENDENT_AMBULATORY_CARE_PROVIDER_SITE_OTHER): Payer: Medicare Other | Admitting: Gastroenterology

## 2013-07-23 VITALS — BP 120/70 | HR 88 | Ht 65.0 in | Wt 118.4 lb

## 2013-07-23 DIAGNOSIS — R634 Abnormal weight loss: Secondary | ICD-10-CM

## 2013-07-23 DIAGNOSIS — K219 Gastro-esophageal reflux disease without esophagitis: Secondary | ICD-10-CM

## 2013-07-23 NOTE — Patient Instructions (Signed)
You have been scheduled for an endoscopy with propofol. Please follow written instructions given to you at your visit today. If you use inhalers (even only as needed), please bring them with you on the day of your procedure. Your physician has requested that you go to www.startemmi.com and enter the access code given to you at your visit today. This web site gives a general overview about your procedure. However, you should still follow specific instructions given to you by our office regarding your preparation for the procedure.  Thank you for choosing me and [facility].  Pricilla Riffle. Dagoberto Ligas., MD., [facility]

## 2013-07-23 NOTE — Progress Notes (Signed)
    History of Present Illness: This is a 73 year old female accompanied by her husband. She had problems with worsening reflux, nausea and weight loss. Her symptoms have improved substantially on pantoprazole 40 mg twice daily along with antireflux measures.  Current Medications, Allergies, Past Medical History, Past Surgical History, Family History and Social History were reviewed in [employer] record.  Physical Exam: General: Well developed , well nourished, no acute distress Head: Normocephalic and atraumatic Eyes:  sclerae anicteric, EOMI Ears: Normal auditory acuity Mouth: No deformity or lesions Lungs: Clear throughout to auscultation Heart: Regular rate and rhythm; no murmurs, rubs or bruits Abdomen: Soft, non tender and non distended. No masses, hepatosplenomegaly or hernias noted. Normal Bowel sounds Musculoskeletal: Symmetrical with no gross deformities  Pulses:  Normal pulses noted Extremities: No clubbing, cyanosis, edema or deformities noted Neurological: Alert oriented x 4, grossly nonfocal Psychological:  Alert and cooperative. Anxious.  Assessment and Recommendations:  1. GERD associated with weight loss. Reflux symptoms are now under very good control after several weeks of Protonix bid. Schedule EGD. The risks, benefits, and alternatives to endoscopy with possible biopsy and possible dilation were discussed with the patient and they consent to proceed.

## 2013-08-02 ENCOUNTER — Encounter: Payer: Self-pay | Admitting: Gastroenterology

## 2013-08-02 ENCOUNTER — Ambulatory Visit (AMBULATORY_SURGERY_CENTER): Payer: Medicare Other | Admitting: Gastroenterology

## 2013-08-02 VITALS — BP 139/71 | HR 65 | Temp 96.8°F | Resp 14 | Ht 65.0 in | Wt 118.0 lb

## 2013-08-02 DIAGNOSIS — K219 Gastro-esophageal reflux disease without esophagitis: Secondary | ICD-10-CM

## 2013-08-02 DIAGNOSIS — K2270 Barrett's esophagus without dysplasia: Secondary | ICD-10-CM

## 2013-08-02 MED ORDER — SODIUM CHLORIDE 0.9 % IV SOLN: 500.0000 mL | INTRAVENOUS | Status: DC

## 2013-08-02 NOTE — Progress Notes (Signed)
Called to room to assist during endoscopic procedure.  Patient ID and intended procedure confirmed with present staff. Received instructions for my participation in the procedure from the performing physician.  

## 2013-08-02 NOTE — Op Note (Signed)
[facility]  [address] [place], [address]   ENDOSCOPY PROCEDURE REPORT  PATIENT: Lori [name], Lori [name]  MR#: [id number] BIRTHDATE: 1940/10/17 , 72  yrs. old GENDER: Female ENDOSCOPIST: Ladene Artist, MD, [facility] PROCEDURE DATE:  08/02/2013 PROCEDURE:  EGD w/ biopsy ASA CLASS:     Class II INDICATIONS:  History of esophageal reflux. MEDICATIONS: MAC sedation, administered by CRNA and propofol (Diprivan) 50mg  IV TOPICAL ANESTHETIC: none DESCRIPTION OF PROCEDURE: After the risks benefits and alternatives of the procedure were thoroughly explained, informed consent was obtained.  The LB [id number] endoscope was introduced through the mouth and advanced to the second portion of the duodenum. Without limitations.  The instrument was slowly withdrawn as the mucosa was fully examined.  ESOPHAGUS: A variable Z-line was observed 37 cm from the incisors. Multiple biopsies were performed.   The esophagus was otherwise normal. STOMACH: The mucosa and folds of the stomach appeared normal. DUODENUM: The duodenal mucosa showed no abnormalities in the bulb and second portion of the duodenum.  Retroflexed views revealed a small hiatal hernia.   The scope was then withdrawn from the patient and the procedure completed.  COMPLICATIONS: There were no complications.  ENDOSCOPIC IMPRESSION: 1.   Variable Z-line 37 cm from the incisors; multiple biopsies 2.   Small hiatal hernia  RECOMMENDATIONS: 1.  Anti-reflux regimen long term 2.  Await pathology results 3.  Continue PPI bid long term  eSigned:  Ladene Artist, MD, [facility] 08/02/2013 10:44 AM

## 2013-08-02 NOTE — Patient Instructions (Signed)
YOU HAD AN ENDOSCOPIC PROCEDURE TODAY AT THE [facility]: Refer to the procedure report that was given to you for any specific questions about what was found during the examination.  If the procedure report does not answer your questions, please call your gastroenterologist to clarify.  If you requested that your care partner not be given the details of your procedure findings, then the procedure report has been included in a sealed envelope for you to review at your convenience later.  YOU SHOULD EXPECT: Some feelings of bloating in the abdomen. Passage of more gas than usual.  Walking can help get rid of the air that was put into your GI tract during the procedure and reduce the bloating. If you had a lower endoscopy (such as a colonoscopy or flexible sigmoidoscopy) you may notice spotting of blood in your stool or on the toilet paper. If you underwent a bowel prep for your procedure, then you may not have a normal bowel movement for a few days.  DIET: Your first meal following the procedure should be a light meal and then it is ok to progress to your normal diet.  A half-sandwich or bowl of soup is an example of a good first meal.  Heavy or fried foods are harder to digest and may make you feel nauseous or bloated.  Likewise meals heavy in dairy and vegetables can cause extra gas to form and this can also increase the bloating.  Drink plenty of fluids but you should avoid alcoholic beverages for 24 hours.  ACTIVITY: Your care partner should take you home directly after the procedure.  You should plan to take it easy, moving slowly for the rest of the day.  You can resume normal activity the day after the procedure however you should NOT DRIVE or use heavy machinery for 24 hours (because of the sedation medicines used during the test).    SYMPTOMS TO REPORT IMMEDIATELY: A gastroenterologist can be reached at any hour.  During normal business hours, 8:30 AM to 5:00 PM Monday through Friday,  call [phone].  After hours and on weekends, please call the GI answering service at [phone] who will take a message and have the physician on call contact you.  Following upper endoscopy (EGD)  Vomiting of blood or coffee ground material  New chest pain or pain under the shoulder blades  Painful or persistently difficult swallowing  New shortness of breath  Fever of 100F or higher  Black, tarry-looking stools  FOLLOW UP: If any biopsies were taken you will be contacted by phone or by letter within the next 1-3 weeks.  Call your gastroenterologist if you have not heard about the biopsies in 3 weeks.  Our staff will call the home number listed on your records the next business day following your procedure to check on you and address any questions or concerns that you may have at that time regarding the information given to you following your procedure. This is a courtesy call and so if there is no answer at the home number and we have not heard from you through the emergency physician on call, we will assume that you have returned to your regular daily activities without incident.  SIGNATURES/CONFIDENTIALITY: You and/or your care partner have signed paperwork which will be entered into your electronic medical record.  These signatures attest to the fact that that the information above on your After Visit Summary has been reviewed and is understood.  Full responsibility of   the confidentiality of this discharge information lies with you and/or your care-partner. 

## 2013-08-02 NOTE — Progress Notes (Signed)
Stable to RR 

## 2013-08-03 ENCOUNTER — Telehealth: Payer: Self-pay | Admitting: *Deleted

## 2013-08-03 NOTE — Telephone Encounter (Signed)
  Follow up Call-  Call back number 08/02/2013 03/13/2013  Post procedure Call Back phone  # [phone]  Permission to leave phone message Yes Yes     Patient questions:  Do you have a fever, pain , or abdominal swelling? no Pain Score  0 *  Have you tolerated food without any problems? yes  Have you been able to return to your normal activities? yes  Do you have any questions about your discharge instructions: Diet   no Medications  no Follow up visit  no  Do you have questions or concerns about your Care? no  Actions: * If pain score is 4 or above: No action needed, pain <4.

## 2013-08-08 ENCOUNTER — Encounter: Payer: Self-pay | Admitting: Gastroenterology

## 2013-08-17 ENCOUNTER — Encounter: Payer: Self-pay | Admitting: Family Medicine

## 2013-08-17 ENCOUNTER — Ambulatory Visit (INDEPENDENT_AMBULATORY_CARE_PROVIDER_SITE_OTHER): Payer: Medicare Other | Admitting: Family Medicine

## 2013-08-17 VITALS — BP 120/72 | HR 94 | Temp 98.0°F | Ht 65.0 in | Wt 120.0 lb

## 2013-08-17 DIAGNOSIS — M8580 Other specified disorders of bone density and structure, unspecified site: Secondary | ICD-10-CM

## 2013-08-17 DIAGNOSIS — F411 Generalized anxiety disorder: Secondary | ICD-10-CM

## 2013-08-17 DIAGNOSIS — H109 Unspecified conjunctivitis: Secondary | ICD-10-CM

## 2013-08-17 DIAGNOSIS — K219 Gastro-esophageal reflux disease without esophagitis: Secondary | ICD-10-CM

## 2013-08-17 DIAGNOSIS — M899 Disorder of bone, unspecified: Secondary | ICD-10-CM

## 2013-08-17 DIAGNOSIS — M949 Disorder of cartilage, unspecified: Secondary | ICD-10-CM

## 2013-08-17 NOTE — Progress Notes (Signed)
No chief complaint on file.   HPI:  Acute visit for ? Pink Eye: - L eye mildy irritated and a little red for 3-4 days, improving now -no visual disturbance, fevers, persistent eye pain, pus  She also has questions about:  GAD: -chronic her whole life and used to use valium - but doesn't want to take medications for this -no panic attacks or depression -wonders about treatment options  GERD/Barrette's Esophagus: -saw GI and had EGD -on PPI and seeing nutritionist and doing much better -gives detailed reports of foods she can and can't eat and wonders what I think about this - she feels so much better and thinks diet has a big part in this, reports she doesn't want to take PPI forever  Hx of Osteopenia: -last dexa several years ago -adamant about not taking medications for this  ROS: See pertinent positives and negatives per HPI.  Past Medical History  Diagnosis Date  . Osteopenia   . Vasculitis   . PMR (polymyalgia rheumatica)   . Varicose veins with inflammation   . Allergy   . Low back pain   . Headache(784.0)   . Hypertension   . 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 History of IBS   . Tubular adenoma of colon 02/2013    Past Surgical History  Procedure Laterality Date  . Dilation and curettage of uterus    . Cesarean section      1 time  . Tonsillectomy    . Popliteal synovial cyst excision      left leg  . Arthroscopic knee      left knee/ torn meniscus  . Carotid artery disection  1988    Carotid Artery Dissection    Family History  Problem Relation Age of Onset  . Multiple myeloma Father   . Kidney disease Brother   . Prostate cancer Brother   . Colon cancer Neg Hx   . Esophageal cancer Neg Hx   . Rectal cancer Neg Hx   . Stomach cancer Neg Hx     History   Social History  . Marital Status: Married    Spouse Name: N/A    Number of Children: N/A  . Years of Education: N/A   Occupational History  . retired     Social History Main Topics  . Smoking status: Never Smoker   . Smokeless tobacco: Never Used  . Alcohol Use: No  . Drug Use: No  . Sexual Activity: Yes   Other Topics Concern  . None   Social History Narrative  . None    Current outpatient prescriptions:aspirin 81 MG tablet, Take 81 mg by mouth daily.  , Disp: , Rfl: ;  betamethasone dipropionate (DIPROLENE) 0.05 % cream, APPLY TO SKIN AS DIRECTED, Disp: 15 g, Rfl: 0;  Cholecalciferol (VITAMIN D) 2000 UNITS tablet, Take 2,000 Units by mouth daily. [place] Vit D 3-Take one daily, Disp: , Rfl: ;  clidinium-chlordiazePOXIDE (LIBRAX) 5-2.5 MG per capsule, Take 1 capsule by mouth as needed., Disp: , Rfl:  fenofibrate 160 MG tablet, TAKE ONE TABLET BY MOUTH ONCE DAILY., Disp: 90 tablet, Rfl: 0;  fluticasone (FLONASE) 50 MCG/ACT nasal spray, as needed. , Disp: , Rfl: ;  meclizine (ANTIVERT) 25 MG tablet, Take 1 tablet (25 mg total) by mouth 3 (three) times daily as needed., Disp: 30 tablet, Rfl: 3;  metoprolol (LOPRESSOR) 50 MG tablet, Take 1/2 tablet bid, Disp: , Rfl:  pantoprazole (PROTONIX) 40  MG tablet, Take 40 mg by mouth 2 (two) times daily., Disp: , Rfl:   EXAM:  Filed Vitals:   08/17/13 1014  BP: 120/72  Pulse: 94  Temp: 98 F (36.7 C)    Body mass index is 19.97 kg/(m^2).  GENERAL: vitals reviewed and listed above, alert, oriented, appears well hydrated and in no acute distress  HEENT: atraumatic, mild conjunctival erythema, visual acuity grossly intact, EOMI, PERRLA, no obvious abnormalities on inspection of external nose and ears  NECK: no obvious masses on inspection  MS: moves all extremities without noticeable abnormality  PSYCH: pleasant and cooperative, no obvious depression, somewhat anxious personality  ASSESSMENT AND PLAN:  Discussed the following assessment and plan:  Conjunctivitis -seems to be resolving, return and optho precautions  GAD (generalized anxiety disorder) -chronic, discuss tx  options and risks including non-pharmacological options  GERD (gastroesophageal reflux disease) -advised she follow GI recs regarding her PPI and continue with lifestyle interventions  Osteopenia -discussed screening and treatment options and since she is adamantly against any interventions other then vit D, calcium and exercise which she is doing, question utility of dexa, she opted to discuss with PCP further  -Patient advised to return or notify a doctor immediately if symptoms worsen or persist or new concerns arise.  There are no Patient Instructions on file for this visit.   Lori [name]

## 2013-08-17 NOTE — Progress Notes (Signed)
Pre visit review using our clinic review tool, if applicable. No additional management support is needed unless otherwise documented below in the visit note. 

## 2013-08-20 ENCOUNTER — Ambulatory Visit: Payer: Medicare Other | Admitting: Internal Medicine

## 2013-08-24 ENCOUNTER — Ambulatory Visit (INDEPENDENT_AMBULATORY_CARE_PROVIDER_SITE_OTHER): Payer: Medicare Other | Admitting: Internal Medicine

## 2013-08-24 ENCOUNTER — Encounter: Payer: Self-pay | Admitting: Internal Medicine

## 2013-08-24 VITALS — BP 130/70 | HR 60 | Temp 98.1°F | Ht 65.0 in | Wt 118.0 lb

## 2013-08-24 DIAGNOSIS — F4323 Adjustment disorder with mixed anxiety and depressed mood: Secondary | ICD-10-CM

## 2013-08-24 DIAGNOSIS — K2270 Barrett's esophagus without dysplasia: Secondary | ICD-10-CM

## 2013-08-24 MED ORDER — ALPRAZOLAM 0.25 MG PO TABS: 0.2500 mg | ORAL_TABLET | Freq: Two times a day (BID) | ORAL | Status: DC | PRN

## 2013-08-24 NOTE — Progress Notes (Signed)
Pre visit review using our clinic review tool, if applicable. No additional management support is needed unless otherwise documented below in the visit note. 

## 2013-08-24 NOTE — Patient Instructions (Signed)
The patient is instructed to continue all medications as prescribed. Schedule followup with check out clerk upon leaving the clinic  

## 2013-08-24 NOTE — Progress Notes (Signed)
   Subjective:    Patient ID: Lori [name], female    DOB: [birth year], 73 y.o.   MRN: [id number]  Hypertension Pertinent negatives include no headaches, neck pain or shortness of breath.   Barrett's esophagus and the endoscopy The patient still relates this to the skin cancer creams Has had symptomatic reflux for years.    Review of Systems  Constitutional: Negative for activity change, appetite change and fatigue.  HENT: Negative for congestion, ear pain, postnasal drip and sinus pressure.   Eyes: Negative for redness and visual disturbance.  Respiratory: Negative for cough, shortness of breath and wheezing.   Gastrointestinal: Positive for nausea. Negative for abdominal pain and abdominal distention.       Gerd  Genitourinary: Negative for dysuria, frequency and menstrual problem.  Musculoskeletal: Negative for arthralgias, joint swelling, myalgias and neck pain.  Skin: Negative for rash and wound.  Neurological: Negative for dizziness, weakness and headaches.  Hematological: Negative for adenopathy. Does not bruise/bleed easily.  Psychiatric/Behavioral: Negative for sleep disturbance and decreased concentration.       Objective:   Physical Exam  Constitutional: She is oriented to person, place, and time. She appears well-developed and well-nourished. No distress.  HENT:  Head: Normocephalic and atraumatic.  Eyes: Conjunctivae and EOM are normal. Pupils are equal, round, and reactive to light.  Neck: Normal range of motion. Neck supple. No JVD present. No tracheal deviation present. No thyromegaly present.  Cardiovascular: Normal rate and regular rhythm.   No murmur heard. Pulmonary/Chest: Effort normal and breath sounds normal. She has no wheezes. She exhibits no tenderness.  Abdominal: Soft. Bowel sounds are normal.  Musculoskeletal: Normal range of motion. She exhibits no edema and no tenderness.  Lymphadenopathy:    She has no cervical adenopathy.  Neurological:  She is alert and oriented to person, place, and time. She has normal reflexes. No cranial nerve deficit.  Skin: Skin is warm and dry. She is not diaphoretic.  Psychiatric: She has a normal mood and affect. Her behavior is normal.          Assessment & Plan:  Dr Fuller Plan started PPI BID and suggested "tums" The combination of the PPI and pepcid complete with the protonix Nutritionist and diet changes Stress reduction  Meditation yoga?

## 2013-09-03 ENCOUNTER — Other Ambulatory Visit: Payer: Self-pay | Admitting: Internal Medicine

## 2013-09-05 ENCOUNTER — Encounter: Payer: Self-pay | Admitting: Physician Assistant

## 2013-09-05 ENCOUNTER — Ambulatory Visit (INDEPENDENT_AMBULATORY_CARE_PROVIDER_SITE_OTHER): Payer: Medicare Other | Admitting: Physician Assistant

## 2013-09-05 ENCOUNTER — Telehealth: Payer: Self-pay | Admitting: Gastroenterology

## 2013-09-05 VITALS — BP 120/72 | HR 76 | Temp 98.1°F | Resp 18 | Wt 118.0 lb

## 2013-09-05 DIAGNOSIS — J029 Acute pharyngitis, unspecified: Secondary | ICD-10-CM

## 2013-09-05 MED ORDER — OMEPRAZOLE-SODIUM BICARBONATE 40-1100 MG PO CAPS: 1.0000 | ORAL_CAPSULE | Freq: Every day | ORAL | Status: DC

## 2013-09-05 NOTE — Telephone Encounter (Signed)
Patient with continued reflux despite BID protonix.  She is having epigastric tenderness and burning in her mouth and nose.  She will come pick up samples of zegerid to try BID.  She will call back on a week with an update and for a possible rx or another PPI.  She was seen by her primary care today and was instructed to contact us for her uncontrolled GERD.  He told her that her sore throat and ear pain was likely reflux related.  She verbalized understanding of instructions and will call back next week with an update

## 2013-09-05 NOTE — Progress Notes (Signed)
Pre visit review using our clinic review tool, if applicable. No additional management support is needed unless otherwise documented below in the visit note. 

## 2013-09-05 NOTE — Progress Notes (Signed)
Subjective:    Patient ID: Lori [name], female    DOB: 1940/05/15, 73 y.o.   MRN: [id number]  Sore Throat  This is a new problem. The current episode started in the past 7 days (4 days). The problem has been waxing and waning. The pain is worse on the right side. There has been no fever. Associated symptoms include ear pain and a hoarse voice. Pertinent negatives include no abdominal pain, congestion, coughing, diarrhea, drooling, ear discharge, headaches, plugged ear sensation, neck pain, shortness of breath, stridor, swollen glands, trouble swallowing or vomiting. Exposure to: unsure, staying with 81 month old granddaughter currently wants to be sure its not strep so she doesn't give to grandchild. Treatments tried: PPI, has history of barrets esophagus and GERD. The treatment provided no relief.  Pt states that similar feeling in throat and in nose due to reflux in the past.    Review of Systems  Constitutional: Negative for fever and chills.  HENT: Positive for ear pain and hoarse voice. Negative for congestion, drooling, ear discharge and trouble swallowing.   Respiratory: Negative for cough, shortness of breath and stridor.   Gastrointestinal: Negative for nausea, vomiting, abdominal pain, diarrhea, constipation and blood in stool.  Musculoskeletal: Negative for neck pain.  Neurological: Negative for headaches.  All other systems reviewed and are negative.  Past Medical History  Diagnosis Date  . Osteopenia   . Vasculitis   . PMR (polymyalgia rheumatica)   . Varicose veins with inflammation   . Allergy   . Low back pain   . Headache(784.0)   . Hypertension   . 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 History of IBS   . Tubular adenoma of colon 02/2013    History   Social History  . Marital Status: Married    Spouse Name: N/A    Number of Children: N/A  . Years of Education: N/A   Occupational History  . retired    Social History Main  Topics  . Smoking status: Never Smoker   . Smokeless tobacco: Never Used  . Alcohol Use: No  . Drug Use: No  . Sexual Activity: Yes   Other Topics Concern  . Not on file   Social History Narrative  . No narrative on file    Past Surgical History  Procedure Laterality Date  . Dilation and curettage of uterus    . Cesarean section      1 time  . Tonsillectomy    . Popliteal synovial cyst excision      left leg  . Arthroscopic knee      left knee/ torn meniscus  . Carotid artery disection  1988    Carotid Artery Dissection    Family History  Problem Relation Age of Onset  . Multiple myeloma Father   . Kidney disease Brother   . Prostate cancer Brother   . Colon cancer Neg Hx   . Esophageal cancer Neg Hx   . Rectal cancer Neg Hx   . Stomach cancer Neg Hx     Allergies  Allergen Reactions  . Hydrocodone-Acetaminophen     VOMITING  . Azithromycin Other (See Comments)    Thrush   . Neomycin   . Penicillins     REACTION: Arm swelling  . Pneumococcal Vaccine Polyvalent     ARM REDNESS WITH TENDERNESS  . Pneumovax [Pneumococcal Polysaccharides]     Current Outpatient Prescriptions on File Prior to Visit  Medication Sig Dispense Refill  . ALPRAZolam (XANAX) 0.25 MG tablet Take 1 tablet (0.25 mg total) by mouth 2 (two) times daily as needed for anxiety.  40 tablet  0  . aspirin 81 MG tablet Take 81 mg by mouth daily.        . betamethasone dipropionate (DIPROLENE) 0.05 % cream APPLY TO SKIN AS DIRECTED  15 g  0  . Cholecalciferol (VITAMIN D) 2000 UNITS tablet Take 2,000 Units by mouth daily. [place] Vit D 3-Take one daily      . clidinium-chlordiazePOXIDE (LIBRAX) 5-2.5 MG per capsule Take 1 capsule by mouth as needed.      . fenofibrate 160 MG tablet TAKE ONE TABLET BY MOUTH ONCE DAILY.  90 tablet  0  . fluticasone (FLONASE) 50 MCG/ACT nasal spray as needed.       . meclizine (ANTIVERT) 25 MG tablet Take 1 tablet (25 mg total) by mouth 3 (three) times daily  as needed.  30 tablet  3  . metoprolol (LOPRESSOR) 50 MG tablet Take 1/2 tablet bid      . metoprolol (LOPRESSOR) 50 MG tablet TAKE ONE TABLET BY MOUTH ONCE DAILY  90 tablet  1  . pantoprazole (PROTONIX) 40 MG tablet Take 40 mg by mouth 2 (two) times daily.       No current facility-administered medications on file prior to visit.    EXAM: BP 120/72  Pulse 76  Temp(Src) 98.1 F (36.7 C) (Oral)  Resp 18  Wt 118 lb (53.524 kg)  SpO2 97%     Objective:   Physical Exam  Nursing note and vitals reviewed. Constitutional: She is oriented to person, place, and time. She appears well-developed and well-nourished. No distress.  HENT:  Head: Normocephalic and atraumatic.  Right Ear: External ear normal.  Left Ear: External ear normal.  Nose: Nose normal.  Mouth/Throat: No oropharyngeal exudate.  Oropharynx is slightly erythematous, no exudate. Bilateral TMs normal. Bilateral frontal and maxillary sinuses non-TTP.   Eyes: Conjunctivae and EOM are normal. Pupils are equal, round, and reactive to light.  Neck: Normal range of motion. Neck supple. No JVD present.  Cardiovascular: Normal rate, regular rhythm, normal heart sounds and intact distal pulses.  Exam reveals no gallop and no friction rub.   No murmur heard. Pulmonary/Chest: Effort normal and breath sounds normal. No stridor. No respiratory distress. She has no wheezes. She has no rales. She exhibits no tenderness.  Musculoskeletal: Normal range of motion.  Lymphadenopathy:    She has no cervical adenopathy.  Neurological: She is alert and oriented to person, place, and time.  Skin: Skin is warm and dry. No rash noted. She is not diaphoretic. No erythema. No pallor.  Psychiatric: She has a normal mood and affect. Her behavior is normal. Judgment and thought content normal.    Lab Results  Component Value Date   WBC 7.3 03/28/2013   HGB 12.2 03/28/2013   HCT 37.0 03/28/2013   PLT 353.0 03/28/2013   GLUCOSE 76 03/28/2013   CHOL 159  03/28/2013   TRIG 57.0 03/28/2013   HDL 59.20 03/28/2013   LDLCALC 88 03/28/2013   ALT 18 03/28/2013   AST 26 03/28/2013   NA 142 03/28/2013   K 4.1 03/28/2013   CL 106 03/28/2013   CREATININE 0.7 03/28/2013   BUN 26* 03/28/2013   CO2 24 03/28/2013   TSH 0.69 03/28/2013   INR 1.02 06/05/2011   HGBA1C 6.2* 10/07/2006  Assessment & Plan:  Vesta was seen today for sore throat.  Diagnoses and associated orders for this visit:  Sore throat Comments: + ear pain, otherwise asymptomatic. Likely due to either reflux or is viral in nature. Will have pt contact GI doctor for follow up of uncontrolled reflux. - POC Rapid Strep A - Throat culture [place])    Patient has history of Barrett's esophagus, is followed by GI. Still experiencing reflux despite PPI treatment, now with throat and ear pain similar per patient to nasal and mouth pain with GERD in the past. Will have patient contact GI for followup on uncontrolled reflux.  Return precautions provided.  Plan to follow up as needed, or for worsening or persistent symptoms despite treatment.  Patient Instructions  We will call you with the throat culture results when available.  Please call your GI doctor to schedule an appointment to reassess her reflux. Her symptoms are very likely related to your reflux.  If you develop difficulty breathing, chest pain, excessive drooling, fullness or mass in your throat, seek emergency medical attention.  Followup as needed for symptoms that worsen or persist despite treatment.

## 2013-09-05 NOTE — Telephone Encounter (Signed)
Agree with trying other PPIs bid however I do not think her mouth and nose burning are GERD related. Would try another PPI if Zegrid is not effective. Possibly other symptoms are not GERD related too.

## 2013-09-06 NOTE — Patient Instructions (Signed)
We will call you with the throat culture results when available.  Please call your GI doctor to schedule an appointment to reassess her reflux. Her symptoms are very likely related to your reflux.  If you develop difficulty breathing, chest pain, excessive drooling, fullness or mass in your throat, seek emergency medical attention.  Followup as needed for symptoms that worsen or persist despite treatment.

## 2013-09-10 ENCOUNTER — Telehealth: Payer: Self-pay | Admitting: Gastroenterology

## 2013-09-10 NOTE — Telephone Encounter (Signed)
Patient states Zegerid is too expensive and will go back to taking pantoprazole. Asked patient if she needs a refill and she states not at this time. Patient has spoke to the PA at [facility] at her appt and states she may try to call there office about possibly getting a referral to a ENT doctor.

## 2013-09-17 ENCOUNTER — Telehealth: Payer: Self-pay

## 2013-09-17 NOTE — Telephone Encounter (Signed)
Per [facility] throat culture was normal.  If pt is still experiencing symptoms pt can be referred to ENT.  Called and spoke with pt and pt states she feels better.

## 2013-10-12 ENCOUNTER — Ambulatory Visit: Payer: Medicare Other | Admitting: Internal Medicine

## 2013-11-10 ENCOUNTER — Other Ambulatory Visit: Payer: Self-pay | Admitting: Internal Medicine

## 2013-11-27 ENCOUNTER — Ambulatory Visit: Payer: Medicare Other | Admitting: Physician Assistant

## 2013-11-29 ENCOUNTER — Encounter: Payer: Self-pay | Admitting: Family Medicine

## 2013-11-29 ENCOUNTER — Ambulatory Visit (INDEPENDENT_AMBULATORY_CARE_PROVIDER_SITE_OTHER): Payer: Medicare Other | Admitting: Family Medicine

## 2013-11-29 VITALS — BP 120/64 | HR 60 | Temp 98.0°F | Ht 65.0 in | Wt 116.0 lb

## 2013-11-29 DIAGNOSIS — E785 Hyperlipidemia, unspecified: Secondary | ICD-10-CM | POA: Insufficient documentation

## 2013-11-29 DIAGNOSIS — I7300 Raynaud's syndrome without gangrene: Secondary | ICD-10-CM

## 2013-11-29 DIAGNOSIS — R911 Solitary pulmonary nodule: Secondary | ICD-10-CM

## 2013-11-29 DIAGNOSIS — L309 Dermatitis, unspecified: Secondary | ICD-10-CM | POA: Insufficient documentation

## 2013-11-29 DIAGNOSIS — I10 Essential (primary) hypertension: Secondary | ICD-10-CM | POA: Insufficient documentation

## 2013-11-29 DIAGNOSIS — R634 Abnormal weight loss: Secondary | ICD-10-CM

## 2013-11-29 DIAGNOSIS — K2270 Barrett's esophagus without dysplasia: Secondary | ICD-10-CM

## 2013-11-29 DIAGNOSIS — E781 Pure hyperglyceridemia: Secondary | ICD-10-CM

## 2013-11-29 NOTE — Assessment & Plan Note (Signed)
Discussed ordering CT scan at this time but patient defers until after further evaluation/decision making for Barrett's esophagus. Stable on exam 1 year ago by CT

## 2013-11-29 NOTE — Patient Instructions (Signed)
Great to meet you!   Stop fenofibrate. Recheck 6 months  See you back in 6 months.   Labs today.

## 2013-11-29 NOTE — Assessment & Plan Note (Signed)
Previously controlled on fenofibrate. Trial off of medication. Recheck lipids 6 months.

## 2013-11-29 NOTE — Progress Notes (Signed)
Garret Reddish, MD Phone: [phone]  Subjective:  Patient presents today to establish care with me as their new primary care provider. Patient was formerly a patient of Dr. Arnoldo Morale. Chief complaint-noted.   Barrett's esophagus Chief of GI at [facility] has upcoming appointment to discuss radiofrequency ablation Dr. Adria Devon on 9/22. Severe reflux started a few weeks after chemotherapeutic cream though always had reflux. Prilosec was not helpful at that time.  Lost 13 lbs over several months. Dr. Fuller Plan placed her on protonix after referral. Had EGD in May and diagnosed with barrett's esophagus.  ROS- does continue to have occasional reflux symptoms, seem to be improved by strict diet. Has lost 2 lbs since last visit but reports she is eating very very healthy.   Hypertriglyceridemia  Has been on fenofibrate for some time. Last triglycerides not elevated ROS-no chest pain (other than reflux burning) or shortness of breath. Does feel some aching all over at times as well as fatigue.   Solitary pulmonary nodule Noted 2013, patient was told needed no further follow up after stability noted in 2014. Radiology report reads needs 1 year follow up. Patient defers at this time. ROS- no shortness of breath or cough  The following were reviewed and entered/updated in epic: Past Medical History  Diagnosis Date  . Osteopenia   . Vasculitis   . PMR (polymyalgia rheumatica)   . Varicose veins with inflammation   . Allergy   . Low back pain   . Headache(784.0)   . Hypertension   . 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 History of IBS   . Tubular adenoma of colon 02/2013   Patient Active Problem List   Diagnosis Date Noted  . Hypertension 11/29/2013    Priority: Medium  . Hypertriglyceridemia 11/29/2013    Priority: Medium  . Barrett's esophagus 08/24/2013    Priority: Medium  . Solitary pulmonary nodule 06/08/2011    Priority: Medium  . Eczema 11/29/2013    Priority: Low    . GERD (gastroesophageal reflux disease) 05/16/2013    Priority: Low  . Vitamin D deficiency 01/29/2012    Priority: Low  . HIATAL HERNIA WITH REFLUX 02/06/2010    Priority: Low  . NEPHROLITHIASIS 12/10/2008    Priority: Low  . DEGENERATIVE JOINT DISEASE, KNEE 03/14/2008    Priority: Low  . VARICOSE VEINS LOWER EXTREMITIES W/INFLAMMATION 03/09/2007    Priority: Low  . ALLERGIC RHINITIS 03/09/2007    Priority: Low  . ACTINIC KERATOSIS, FOREHEAD, LEFT 03/09/2007    Priority: Low  . Headache(784.0) 03/09/2007    Priority: Low  . MENIERE'S DISEASE 09/22/2006    Priority: Low  . RAYNAUD'S DISEASE 09/22/2006    Priority: Low  . OSTEOPENIA 09/22/2006    Priority: Low   Past Surgical History  Procedure Laterality Date  . Dilation and curettage of uterus    . Cesarean section      1 time  . Tonsillectomy    . Popliteal synovial cyst excision      left leg  . Arthroscopic knee      left knee/ torn meniscus  . Carotid artery disection  1988    Carotid Artery Dissection    Family History  Problem Relation Age of Onset  . Multiple myeloma Father   . Kidney disease Brother   . Prostate cancer Brother   . Colon cancer Neg Hx   . Esophageal cancer Neg Hx   . Rectal cancer Neg Hx   .  Stomach cancer Neg Hx     Medications- reviewed and updated Current Outpatient Prescriptions  Medication Sig Dispense Refill  . aspirin 81 MG tablet Take 81 mg by mouth daily.        . Cholecalciferol (VITAMIN D) 2000 UNITS tablet Take 2,000 Units by mouth daily. [place] Vit D 3-Take one daily      . fenofibrate 160 MG tablet TAKE ONE TABLET BY MOUTH ONCE DAILY  90 tablet  2  . metoprolol (LOPRESSOR) 50 MG tablet Take 1/2 tablet bid      . pantoprazole (PROTONIX) 40 MG tablet Take 40 mg by mouth 2 (two) times daily.      . betamethasone dipropionate (DIPROLENE) 0.05 % cream APPLY TO SKIN AS DIRECTED  15 g  0  . fluticasone (FLONASE) 50 MCG/ACT nasal spray as needed.        No current  facility-administered medications for this visit.    Allergies-reviewed and updated Allergies  Allergen Reactions  . Hydrocodone-Acetaminophen     VOMITING  . Azithromycin Other (See Comments)    Thrush   . Neomycin   . Penicillins     REACTION: Arm swelling  . Pneumococcal Vaccine Polyvalent     ARM REDNESS WITH TENDERNESS  . Pneumovax [Pneumococcal Polysaccharide Vaccine]     History   Social History  . Marital Status: Married    Spouse Name: N/A    Number of Children: N/A  . Years of Education: N/A   Occupational History  . retired    Social History Main Topics  . Smoking status: Never Smoker   . Smokeless tobacco: Never Used  . Alcohol Use: No  . Drug Use: No  . Sexual Activity: Yes   Other Topics Concern  . None   Social History Narrative  . None    ROS--See HPI   Objective: BP 120/64  Pulse 60  Temp(Src) 98 F (36.7 C)  Ht $R'5\' 5"'hq$  (1.651 m)  Wt 116 lb (52.617 kg)  BMI 19.30 kg/m2 Gen: NAD, resting comfortably HEENT: Mucous membranes are moist. Oropharynx normal Neck: no thyromegaly CV: RRR no murmurs rubs or gallops Lungs: CTAB no crackles, wheeze, rhonchi Abdomen: soft/nontender/nondistended/normal bowel sounds. No rebound or guarding.  Ext: no edema Skin: warm, dry Neuro: grossly normal, moves all extremities, PERRLA  Assessment/Plan:  Solitary pulmonary nodule Discussed ordering CT scan at this time but patient defers until after further evaluation/decision making for Barrett's esophagus. Stable on exam 1 year ago by CT  Hypertriglyceridemia Previously controlled on fenofibrate. Trial off of medication. Recheck lipids 6 months.   Barrett's esophagus Previous plan with Dr. Fuller Plan was for EGD in 3 years. Patient is interested in seeking opinion of Dr. Adria Devon and has appointment 9/22 at [facility]. I advised patient that likelihood of malignancy is low and I agreed with regular surveilance option. Continue protonix.    Orders Placed This  Encounter  Procedures  . CBC with Differential  . TSH    [place]  . Comprehensive metabolic panel    [place]

## 2013-11-29 NOTE — Assessment & Plan Note (Addendum)
Previous plan with Dr. Fuller Plan was for EGD in 3 years. Patient is interested in seeking opinion of Dr. Adria Devon and has appointment 9/22 at [facility]. I advised patient that likelihood of malignancy is low and I agreed with regular surveilance option. Continue protonix.

## 2013-11-30 ENCOUNTER — Ambulatory Visit: Payer: Medicare Other

## 2013-11-30 DIAGNOSIS — E039 Hypothyroidism, unspecified: Secondary | ICD-10-CM

## 2013-11-30 LAB — CBC WITH DIFFERENTIAL/PLATELET
Basophils Absolute: 0 10*3/uL (ref 0.0–0.1)
Basophils Relative: 0.3 % (ref 0.0–3.0)
EOS PCT: 5 % (ref 0.0–5.0)
Eosinophils Absolute: 0.3 10*3/uL (ref 0.0–0.7)
HEMATOCRIT: 36 % (ref 36.0–46.0)
HEMOGLOBIN: 11.7 g/dL — AB (ref 12.0–15.0)
LYMPHS ABS: 2.2 10*3/uL (ref 0.7–4.0)
Lymphocytes Relative: 36.8 % (ref 12.0–46.0)
MCHC: 32.4 g/dL (ref 30.0–36.0)
MCV: 86 fl (ref 78.0–100.0)
MONO ABS: 0.6 10*3/uL (ref 0.1–1.0)
MONOS PCT: 10.7 % (ref 3.0–12.0)
NEUTROS ABS: 2.8 10*3/uL (ref 1.4–7.7)
Neutrophils Relative %: 47.2 % (ref 43.0–77.0)
Platelets: 279 10*3/uL (ref 150.0–400.0)
RBC: 4.19 Mil/uL (ref 3.87–5.11)
RDW: 14.9 % (ref 11.5–15.5)
WBC: 6 10*3/uL (ref 4.0–10.5)

## 2013-11-30 LAB — COMPREHENSIVE METABOLIC PANEL
ALT: 22 U/L (ref 0–35)
AST: 25 U/L (ref 0–37)
Albumin: 3.9 g/dL (ref 3.5–5.2)
Alkaline Phosphatase: 47 U/L (ref 39–117)
BUN: 29 mg/dL — AB (ref 6–23)
CALCIUM: 9.6 mg/dL (ref 8.4–10.5)
CHLORIDE: 106 meq/L (ref 96–112)
CO2: 26 meq/L (ref 19–32)
Creatinine, Ser: 0.7 mg/dL (ref 0.4–1.2)
GFR: 82.06 mL/min (ref >60.00)
GLUCOSE: 92 mg/dL (ref 70–99)
Potassium: 4 mEq/L (ref 3.5–5.1)
Sodium: 138 mEq/L (ref 135–145)
Total Bilirubin: 0.2 mg/dL (ref 0.2–1.2)
Total Protein: 7.6 g/dL (ref 6.0–8.3)

## 2013-11-30 LAB — TSH: TSH: 0.23 u[IU]/mL — AB (ref 0.35–4.50)

## 2013-11-30 LAB — T4, FREE: FREE T4: 1.19 ng/dL (ref 0.60–1.60)

## 2013-11-30 LAB — T3, FREE: T3 FREE: 3 pg/mL (ref 2.3–4.2)

## 2013-12-24 ENCOUNTER — Ambulatory Visit (INDEPENDENT_AMBULATORY_CARE_PROVIDER_SITE_OTHER): Payer: Medicare Other | Admitting: Family Medicine

## 2013-12-24 DIAGNOSIS — Z23 Encounter for immunization: Secondary | ICD-10-CM

## 2013-12-25 ENCOUNTER — Encounter: Payer: Self-pay | Admitting: Family Medicine

## 2013-12-26 ENCOUNTER — Telehealth: Payer: Self-pay

## 2013-12-26 DIAGNOSIS — M8580 Other specified disorders of bone density and structure, unspecified site: Secondary | ICD-10-CM

## 2013-12-26 NOTE — Telephone Encounter (Signed)
Lori [name] can you please call pt and get her scheduled for her Bone density at [facility]. The orders are already in.

## 2013-12-27 ENCOUNTER — Encounter: Payer: Self-pay | Admitting: Family Medicine

## 2013-12-27 NOTE — Telephone Encounter (Signed)
Pt has been scheduled.  °

## 2013-12-28 ENCOUNTER — Ambulatory Visit (INDEPENDENT_AMBULATORY_CARE_PROVIDER_SITE_OTHER)
Admission: RE | Admit: 2013-12-28 | Discharge: 2013-12-28 | Disposition: A | Discharge status: Home / Self Care | Payer: Medicare Other | Source: Ambulatory Visit | Attending: Family Medicine | Admitting: Family Medicine

## 2013-12-28 DIAGNOSIS — M810 Age-related osteoporosis without current pathological fracture: Secondary | ICD-10-CM

## 2013-12-28 DIAGNOSIS — M8580 Other specified disorders of bone density and structure, unspecified site: Secondary | ICD-10-CM

## 2014-01-09 ENCOUNTER — Ambulatory Visit (INDEPENDENT_AMBULATORY_CARE_PROVIDER_SITE_OTHER): Payer: Medicare Other | Admitting: Family Medicine

## 2014-01-09 ENCOUNTER — Encounter: Payer: Self-pay | Admitting: Family Medicine

## 2014-01-09 VITALS — BP 128/62 | Temp 98.7°F | Wt 118.0 lb

## 2014-01-09 DIAGNOSIS — I10 Essential (primary) hypertension: Secondary | ICD-10-CM

## 2014-01-09 DIAGNOSIS — R52 Pain, unspecified: Secondary | ICD-10-CM

## 2014-01-09 DIAGNOSIS — M810 Age-related osteoporosis without current pathological fracture: Secondary | ICD-10-CM

## 2014-01-09 LAB — CBC WITH DIFFERENTIAL/PLATELET
BASOS ABS: 0 10*3/uL (ref 0.0–0.1)
Basophils Relative: 0.3 % (ref 0.0–3.0)
EOS ABS: 0.2 10*3/uL (ref 0.0–0.7)
Eosinophils Relative: 3.3 % (ref 0.0–5.0)
HCT: 36.5 % (ref 36.0–46.0)
Hemoglobin: 11.8 g/dL — ABNORMAL LOW (ref 12.0–15.0)
Lymphocytes Relative: 37.1 % (ref 12.0–46.0)
Lymphs Abs: 2.6 10*3/uL (ref 0.7–4.0)
MCHC: 32.3 g/dL (ref 30.0–36.0)
MCV: 85.7 fl (ref 78.0–100.0)
Monocytes Absolute: 0.7 10*3/uL (ref 0.1–1.0)
Monocytes Relative: 10.6 % (ref 3.0–12.0)
NEUTROS ABS: 3.4 10*3/uL (ref 1.4–7.7)
NEUTROS PCT: 48.7 % (ref 43.0–77.0)
Platelets: 256 10*3/uL (ref 150.0–400.0)
RBC: 4.26 Mil/uL (ref 3.87–5.11)
RDW: 15 % (ref 11.5–15.5)
WBC: 7 10*3/uL (ref 4.0–10.5)

## 2014-01-09 LAB — VITAMIN D 25 HYDROXY (VIT D DEFICIENCY, FRACTURES): VITD: 35.93 ng/mL (ref 30.00–100.00)

## 2014-01-09 LAB — SEDIMENTATION RATE: SED RATE: 43 mm/h — AB (ref 0–22)

## 2014-01-09 NOTE — Assessment & Plan Note (Signed)
Discussed treatment with bisphosphonates but patient not interested. Advised restarting exercise, continue calcium through diet, and vitamin D 2000 units but check vitamin D today as patient like to decrease possible 1000 units.

## 2014-01-09 NOTE — Patient Instructions (Addendum)
Achiness  Check labs including ESR and CRP  Osteoporosis  Continue 800 units Vit D (you are on 2000 currently)  And 1247m calcium through diet  Add back regular walking   Decision made to hold off on bisphosphonate for osteoporosis due to your achiness and medicine sensitivity    Recheck dexa 2 years

## 2014-01-09 NOTE — Progress Notes (Signed)
Garret Reddish, MD Phone: [phone]  Subjective:   Lori [name] is a 72 y.o. year old very pleasant female patient who presents with the following:  Osteoporosis new diagnosis Reviewed DEXA scan. Patient with adequate calcium for diet. takes 2000 units vitamin D. Not currently interested in bisphosphonates ROS-no bone pain  Achiness including shoulders Slight flu like symptoms. Aching shoulders and arms and lower legs since late June. Going to nutritionist and was eating a lot of almond butter and worry about vitamin E. Thought it could be allergies-so took claritin for many months and recently started flonase-feels slightly better and stopped claritin. Does have a history of polymyalgia rheumatica and previously followed by rheumatology Dr. Marveen Reeks  Past Medical History- Patient Active Problem List   Diagnosis Date Noted  . Hypertension 11/29/2013    Priority: Medium  . Hypertriglyceridemia 11/29/2013    Priority: Medium  . Barrett's esophagus 08/24/2013    Priority: Medium  . Solitary pulmonary nodule 06/08/2011    Priority: Medium  . Osteoporosis 09/22/2006    Priority: Medium  . Eczema 11/29/2013    Priority: Low  . GERD (gastroesophageal reflux disease) 05/16/2013    Priority: Low  . Vitamin D deficiency 01/29/2012    Priority: Low  . HIATAL HERNIA WITH REFLUX 02/06/2010    Priority: Low  . NEPHROLITHIASIS 12/10/2008    Priority: Low  . DEGENERATIVE JOINT DISEASE, KNEE 03/14/2008    Priority: Low  . VARICOSE VEINS LOWER EXTREMITIES W/INFLAMMATION 03/09/2007    Priority: Low  . ALLERGIC RHINITIS 03/09/2007    Priority: Low  . ACTINIC KERATOSIS, FOREHEAD, LEFT 03/09/2007    Priority: Low  . Headache(784.0) 03/09/2007    Priority: Low  . MENIERE'S DISEASE 09/22/2006    Priority: Low  . RAYNAUD'S DISEASE 09/22/2006    Priority: Low   Medications- reviewed and updated Current Outpatient Prescriptions  Medication Sig Dispense Refill  . aspirin 81 MG  tablet Take 81 mg by mouth daily.        . betamethasone dipropionate (DIPROLENE) 0.05 % cream APPLY TO SKIN AS DIRECTED  15 g  0  . Cholecalciferol (VITAMIN D) 2000 UNITS tablet Take 2,000 Units by mouth daily. [place] Vit D 3-Take one daily      . fenofibrate 160 MG tablet TAKE ONE TABLET BY MOUTH ONCE DAILY  90 tablet  2  . fluticasone (FLONASE) 50 MCG/ACT nasal spray as needed.       . metoprolol (LOPRESSOR) 50 MG tablet Take 1/2 tablet bid      . pantoprazole (PROTONIX) 40 MG tablet Take 40 mg by mouth 2 (two) times daily.       No current facility-administered medications for this visit.    Objective: BP 128/62  Temp(Src) 98.7 F (37.1 C)  Wt 118 lb (53.524 kg) Gen: NAD, resting comfortably in chair, anxious  MSK full range of motion of all joints. No hot swollen joints.  Assessment/Plan:  Osteoporosis Discussed treatment with bisphosphonates but patient not interested. Advised restarting exercise, continue calcium through diet, and vitamin D 2000 units but check vitamin D today as patient like to decrease possible 1000 units.   Achiness including shoulders Check ESR and CRP given history of polymyalgia rheumatica. Also check CBC and CMET. Patient wondering if this is allergies and I thought not likely.  Orders Placed This Encounter  Procedures  . Sedimentation rate    [place]  . C-reactive Protein  . CBC with Differential  . Comprehensive metabolic panel  [place]  . Vit D  25 hydroxy (rtn osteoporosis monitoring)    [place]

## 2014-01-10 LAB — COMPREHENSIVE METABOLIC PANEL
ALK PHOS: 69 U/L (ref 39–117)
ALT: 22 U/L (ref 0–35)
AST: 20 U/L (ref 0–37)
Albumin: 3.4 g/dL — ABNORMAL LOW (ref 3.5–5.2)
BUN: 29 mg/dL — ABNORMAL HIGH (ref 6–23)
CO2: 17 mEq/L — ABNORMAL LOW (ref 19–32)
CREATININE: 0.6 mg/dL (ref 0.4–1.2)
Calcium: 9.2 mg/dL (ref 8.4–10.5)
Chloride: 106 mEq/L (ref 96–112)
GFR: 96.99 mL/min (ref >60.00)
Glucose, Bld: 87 mg/dL (ref 70–99)
Potassium: 4.4 mEq/L (ref 3.5–5.1)
Sodium: 138 mEq/L (ref 135–145)
Total Bilirubin: 0.5 mg/dL (ref 0.2–1.2)
Total Protein: 7.1 g/dL (ref 6.0–8.3)

## 2014-01-10 LAB — C-REACTIVE PROTEIN: CRP: 0.6 mg/dL (ref 0.5–20.0)

## 2014-03-04 ENCOUNTER — Encounter: Payer: Self-pay | Admitting: Family Medicine

## 2014-03-04 ENCOUNTER — Ambulatory Visit (INDEPENDENT_AMBULATORY_CARE_PROVIDER_SITE_OTHER): Payer: Medicare Other | Admitting: Family Medicine

## 2014-03-04 VITALS — BP 133/62 | HR 75 | Temp 98.8°F | Ht 65.0 in | Wt 118.0 lb

## 2014-03-04 DIAGNOSIS — J069 Acute upper respiratory infection, unspecified: Secondary | ICD-10-CM

## 2014-03-04 NOTE — Progress Notes (Signed)
   Subjective:    Patient ID: Lori [name], female    DOB: 23-Dec-1940, 73 y.o.   MRN: [id number]  HPI Here for 4 days of stuffy head, sneezing, PND, and a dry cough. No fever. On Claritin.    Review of Systems  Constitutional: Negative.   HENT: Positive for congestion and postnasal drip. Negative for sinus pressure.   Eyes: Negative.   Respiratory: Positive for cough.        Objective:   Physical Exam  Constitutional: She appears well-developed and well-nourished.  HENT:  Right Ear: External ear normal.  Left Ear: External ear normal.  Nose: Nose normal.  Mouth/Throat: Oropharynx is clear and moist.  Eyes: Conjunctivae are normal.  Pulmonary/Chest: Effort normal and breath sounds normal.  Lymphadenopathy:    She has no cervical adenopathy.          Assessment & Plan:  Drink fluids, add Mucinex. Recheck prn

## 2014-03-04 NOTE — Progress Notes (Signed)
Pre visit review using our clinic review tool, if applicable. No additional management support is needed unless otherwise documented below in the visit note. 

## 2014-03-05 ENCOUNTER — Other Ambulatory Visit: Payer: Self-pay | Admitting: *Deleted

## 2014-03-05 MED ORDER — METOPROLOL TARTRATE 50 MG PO TABS: 25.0000 mg | ORAL_TABLET | Freq: Two times a day (BID) | ORAL | Status: DC

## 2014-04-02 ENCOUNTER — Encounter: Payer: Self-pay | Admitting: Family Medicine

## 2014-04-02 ENCOUNTER — Ambulatory Visit (INDEPENDENT_AMBULATORY_CARE_PROVIDER_SITE_OTHER): Payer: Medicare Other | Admitting: Family Medicine

## 2014-04-02 VITALS — BP 132/66 | HR 73 | Temp 98.4°F | Ht 65.0 in | Wt 122.0 lb

## 2014-04-02 DIAGNOSIS — Z91048 Other nonmedicinal substance allergy status: Secondary | ICD-10-CM

## 2014-04-02 DIAGNOSIS — Z9109 Other allergy status, other than to drugs and biological substances: Secondary | ICD-10-CM

## 2014-04-02 NOTE — Progress Notes (Signed)
Pre visit review using our clinic review tool, if applicable. No additional management support is needed unless otherwise documented below in the visit note. 

## 2014-04-03 NOTE — Progress Notes (Signed)
   Subjective:    Patient ID: Lori [name], female    DOB: June 19, 1940, 74 y.o.   MRN: [id number]  HPI Here for some residual problems like itchy eyes, PND, and stuffy nose. She was here last month for a full viral URI but most of these sx have resolved. She is no longer coughing and the ST is gone. She had taken allergy meds last fall but did not resume them.    Review of Systems  Constitutional: Negative.   HENT: Positive for congestion, postnasal drip, rhinorrhea and sneezing. Negative for ear pain and sore throat.   Eyes: Negative.   Respiratory: Negative.        Objective:   Physical Exam  Constitutional: She appears well-developed and well-nourished.  HENT:  Right Ear: External ear normal.  Left Ear: External ear normal.  Nose: Nose normal.  Mouth/Throat: Oropharynx is clear and moist.  Eyes: Conjunctivae are normal.  Neck: No thyromegaly present.  Lymphadenopathy:    She has no cervical adenopathy.          Assessment & Plan:  Advised her to get back on daily Claritin and Flonase. Recheck prn

## 2014-04-05 DIAGNOSIS — Z1231 Encounter for screening mammogram for malignant neoplasm of breast: Secondary | ICD-10-CM | POA: Diagnosis not present

## 2014-04-05 LAB — HM MAMMOGRAPHY: HM MAMMO: NORMAL

## 2014-04-08 ENCOUNTER — Encounter: Payer: Self-pay | Admitting: Family Medicine

## 2014-04-11 ENCOUNTER — Encounter: Payer: Self-pay | Admitting: Family Medicine

## 2014-05-30 ENCOUNTER — Ambulatory Visit (INDEPENDENT_AMBULATORY_CARE_PROVIDER_SITE_OTHER): Payer: Medicare Other | Admitting: Family Medicine

## 2014-05-30 ENCOUNTER — Encounter: Payer: Self-pay | Admitting: Family Medicine

## 2014-05-30 VITALS — BP 118/64 | HR 91 | Temp 97.9°F | Wt 121.0 lb

## 2014-05-30 DIAGNOSIS — R911 Solitary pulmonary nodule: Secondary | ICD-10-CM

## 2014-05-30 DIAGNOSIS — M810 Age-related osteoporosis without current pathological fracture: Secondary | ICD-10-CM | POA: Diagnosis not present

## 2014-05-30 DIAGNOSIS — I10 Essential (primary) hypertension: Secondary | ICD-10-CM | POA: Diagnosis not present

## 2014-05-30 MED ORDER — PANTOPRAZOLE SODIUM 40 MG PO TBEC: 40.0000 mg | DELAYED_RELEASE_TABLET | Freq: Every day | ORAL | Status: DC

## 2014-05-30 NOTE — Patient Instructions (Signed)
Please schedule physical in 6 months . Come in fasting and we can update your labs.   Continue 2000 units Vit D  Refilled reflux/Barrett's medication  Stay off fenofibrate

## 2014-05-30 NOTE — Progress Notes (Signed)
Garret Reddish, MD Phone: [phone]  Subjective:   Lori [name] is a 74 y.o. year old very pleasant female patient who presents with the following:  Osteoporosis/low Vit D- presumed stable Still not interested in bisphosphonates. Working with nutritionist and exercising. Patient had wanted to decrease vit D and recheck levels but did not so I suggested simply continuing at 2000 IU as has been doing well. 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 Patient has declined in past due to radiation concerns. She is not a smoker or former smoker.  ROS-  No chest pain or shortness of breath  Hypertension-controlled  BP Readings from Last 3 Encounters:  05/30/14 118/64  04/02/14 132/66  03/04/14 133/62   Home BP monitoring-no Compliant with medications-yes without side effects ROS-Denies any CP, HA, SOB, blurry vision, LE edema  Past Medical History- Patient Active Problem List   Diagnosis Date Noted  . Hypertension 11/29/2013    Priority: Medium  . Hypertriglyceridemia 11/29/2013    Priority: Medium  . Barrett's esophagus 08/24/2013    Priority: Medium  . Solitary pulmonary nodule 06/08/2011    Priority: Medium  . Osteoporosis 09/22/2006    Priority: Medium  . Eczema 11/29/2013    Priority: Low  . GERD (gastroesophageal reflux disease) 05/16/2013    Priority: Low  . Vitamin D deficiency 01/29/2012    Priority: Low  . HIATAL HERNIA WITH REFLUX 02/06/2010    Priority: Low  . DEGENERATIVE JOINT DISEASE, KNEE 03/14/2008    Priority: Low  . VARICOSE VEINS LOWER EXTREMITIES W/INFLAMMATION 03/09/2007    Priority: Low  . ALLERGIC RHINITIS 03/09/2007    Priority: Low  . ACTINIC KERATOSIS, FOREHEAD, LEFT 03/09/2007    Priority: Low  . Headache(784.0) 03/09/2007   Priority: Low  . MENIERE'S DISEASE 09/22/2006    Priority: Low  . RAYNAUD'S DISEASE 09/22/2006    Priority: Low   Medications- reviewed and updated Current Outpatient Prescriptions  Medication Sig Dispense Refill  . aspirin 81 MG tablet Take 81 mg by mouth daily.      . Cholecalciferol (VITAMIN D) 2000 UNITS tablet Take 2,000 Units by mouth daily. [place] Vit D 3-Take one daily    . fenofibrate 160 MG tablet TAKE ONE TABLET BY MOUTH ONCE DAILY 90 tablet 2  . metoprolol (LOPRESSOR) 50 MG tablet Take 0.5 tablets (25 mg total) by mouth 2 (two) times daily. 90 tablet 1  . pantoprazole (PROTONIX) 40 MG tablet Take 40 mg by mouth daily.     . betamethasone dipropionate (DIPROLENE) 0.05 % cream APPLY TO SKIN AS DIRECTED (Patient not taking: Reported on 04/02/2014) 15 g 0  . fluticasone (FLONASE) 50 MCG/ACT nasal spray as needed.      No current facility-administered medications for this visit.    Objective: BP 118/64 mmHg  Pulse 91  Temp(Src) 97.9 F (36.6 C)  Wt 121 lb (54.885 kg) Gen: NAD, resting comfortably in chair CV: RRR no murmurs rubs or gallops Lungs: CTAB no crackles, wheeze, rhonchi Abdomen: soft/nontender/nondistended/normal bowel sounds. No rebound or guarding.  Ext: no edema Skin: warm, dry, no rash  Neuro: grossly normal, moves all extremities, normal gait   Assessment/Plan:  Solitary pulmonary nodule I continued to advocate for follow up but patient is concerned about potential radiation which is a legitimate concern. We  discussed readdressing this at 6 months for CPE.    Hypertension Controlled on Metoprolol 25 mg BID   Osteoporosis Once again discussed bisphosphonate's. Patient still wants to continue nutrition counseling, calcium through diet, vitamin D 2000 units. Check vitamin D next visit (last value >40)   6 month CPE follow up  Meds ordered this encounter  Medications  . pantoprazole (PROTONIX) 40 MG tablet    Sig: Take 1 tablet (40 mg  total) by mouth daily.    Dispense:  30 tablet    Refill:  11

## 2014-05-30 NOTE — Assessment & Plan Note (Signed)
I continued to advocate for follow up but patient is concerned about potential radiation which is a legitimate concern. We discussed readdressing this at 6 months for CPE.

## 2014-05-30 NOTE — Assessment & Plan Note (Signed)
Controlled on Metoprolol 25 mg BID

## 2014-05-30 NOTE — Assessment & Plan Note (Signed)
Once again discussed bisphosphonate's. Patient still wants to continue nutrition counseling, calcium through diet, vitamin D 2000 units. Check vitamin D next visit (last value >40)

## 2014-08-23 DIAGNOSIS — R109 Unspecified abdominal pain: Secondary | ICD-10-CM | POA: Diagnosis not present

## 2014-08-23 DIAGNOSIS — R312 Other microscopic hematuria: Secondary | ICD-10-CM | POA: Diagnosis not present

## 2014-08-23 DIAGNOSIS — R3914 Feeling of incomplete bladder emptying: Secondary | ICD-10-CM | POA: Diagnosis not present

## 2014-08-23 DIAGNOSIS — N3020 Other chronic cystitis without hematuria: Secondary | ICD-10-CM | POA: Diagnosis not present

## 2014-08-23 DIAGNOSIS — R339 Retention of urine, unspecified: Secondary | ICD-10-CM | POA: Diagnosis not present

## 2014-08-23 DIAGNOSIS — N200 Calculus of kidney: Secondary | ICD-10-CM | POA: Diagnosis not present

## 2014-08-23 DIAGNOSIS — N390 Urinary tract infection, site not specified: Secondary | ICD-10-CM | POA: Diagnosis not present

## 2014-08-27 DIAGNOSIS — K8020 Calculus of gallbladder without cholecystitis without obstruction: Secondary | ICD-10-CM | POA: Diagnosis not present

## 2014-08-27 DIAGNOSIS — N200 Calculus of kidney: Secondary | ICD-10-CM | POA: Diagnosis not present

## 2014-08-27 DIAGNOSIS — N210 Calculus in bladder: Secondary | ICD-10-CM | POA: Diagnosis not present

## 2014-08-27 DIAGNOSIS — R350 Frequency of micturition: Secondary | ICD-10-CM | POA: Diagnosis not present

## 2014-08-27 DIAGNOSIS — N3020 Other chronic cystitis without hematuria: Secondary | ICD-10-CM | POA: Diagnosis not present

## 2014-09-08 ENCOUNTER — Other Ambulatory Visit: Payer: Self-pay | Admitting: Family Medicine

## 2014-09-09 ENCOUNTER — Encounter: Payer: Self-pay | Admitting: Gastroenterology

## 2014-11-14 ENCOUNTER — Other Ambulatory Visit: Payer: Self-pay | Admitting: Internal Medicine

## 2014-11-14 ENCOUNTER — Other Ambulatory Visit: Payer: Self-pay

## 2014-11-14 MED ORDER — FLUTICASONE PROPIONATE 50 MCG/ACT NA SUSP: 2.0000 | NASAL | Status: DC | PRN

## 2014-12-03 ENCOUNTER — Other Ambulatory Visit (INDEPENDENT_AMBULATORY_CARE_PROVIDER_SITE_OTHER): Payer: Medicare Other

## 2014-12-03 DIAGNOSIS — Z0000 Encounter for general adult medical examination without abnormal findings: Secondary | ICD-10-CM

## 2014-12-03 DIAGNOSIS — I10 Essential (primary) hypertension: Secondary | ICD-10-CM

## 2014-12-03 LAB — LIPID PANEL
CHOL/HDL RATIO: 3
Cholesterol: 216 mg/dL — ABNORMAL HIGH (ref 0–200)
HDL: 77 mg/dL (ref >39.00)
LDL Cholesterol: 122 mg/dL — ABNORMAL HIGH (ref 0–99)
NONHDL: 139.3
Triglycerides: 86 mg/dL (ref 0.0–149.0)
VLDL: 17.2 mg/dL (ref 0.0–40.0)

## 2014-12-03 LAB — CBC WITH DIFFERENTIAL/PLATELET
BASOS ABS: 0 10*3/uL (ref 0.0–0.1)
Basophils Relative: 0.4 % (ref 0.0–3.0)
EOS ABS: 0.5 10*3/uL (ref 0.0–0.7)
Eosinophils Relative: 7.2 % — ABNORMAL HIGH (ref 0.0–5.0)
HEMATOCRIT: 40.1 % (ref 36.0–46.0)
Hemoglobin: 13.1 g/dL (ref 12.0–15.0)
Lymphocytes Relative: 36.3 % (ref 12.0–46.0)
Lymphs Abs: 2.4 10*3/uL (ref 0.7–4.0)
MCHC: 32.5 g/dL (ref 30.0–36.0)
MCV: 86.7 fl (ref 78.0–100.0)
MONO ABS: 0.8 10*3/uL (ref 0.1–1.0)
MONOS PCT: 11.4 % (ref 3.0–12.0)
NEUTROS ABS: 3 10*3/uL (ref 1.4–7.7)
Neutrophils Relative %: 44.7 % (ref 43.0–77.0)
PLATELETS: 261 10*3/uL (ref 150.0–400.0)
RBC: 4.63 Mil/uL (ref 3.87–5.11)
RDW: 13.8 % (ref 11.5–15.5)
WBC: 6.6 10*3/uL (ref 4.0–10.5)

## 2014-12-03 LAB — BASIC METABOLIC PANEL
BUN: 20 mg/dL (ref 6–23)
CO2: 30 mEq/L (ref 19–32)
Calcium: 9.4 mg/dL (ref 8.4–10.5)
Chloride: 103 mEq/L (ref 96–112)
Creatinine, Ser: 0.49 mg/dL (ref 0.40–1.20)
GFR: 131.67 mL/min (ref >60.00)
GLUCOSE: 78 mg/dL (ref 70–99)
Potassium: 3.7 mEq/L (ref 3.5–5.1)
SODIUM: 140 meq/L (ref 135–145)

## 2014-12-03 LAB — TSH: TSH: 1.12 u[IU]/mL (ref 0.35–4.50)

## 2014-12-03 LAB — POCT URINALYSIS DIPSTICK
BILIRUBIN UA: NEGATIVE
Glucose, UA: NEGATIVE
KETONES UA: NEGATIVE
LEUKOCYTES UA: NEGATIVE
Nitrite, UA: NEGATIVE
PH UA: 7
PROTEIN UA: NEGATIVE
RBC UA: NEGATIVE
Spec Grav, UA: 1.015
Urobilinogen, UA: 0.2

## 2014-12-03 LAB — HEPATIC FUNCTION PANEL
ALT: 14 U/L (ref 0–35)
AST: 17 U/L (ref 0–37)
Albumin: 4 g/dL (ref 3.5–5.2)
Alkaline Phosphatase: 79 U/L (ref 39–117)
BILIRUBIN DIRECT: 0.1 mg/dL (ref 0.0–0.3)
TOTAL PROTEIN: 7.5 g/dL (ref 6.0–8.3)
Total Bilirubin: 0.4 mg/dL (ref 0.2–1.2)

## 2014-12-10 ENCOUNTER — Encounter: Payer: Self-pay | Admitting: Family Medicine

## 2014-12-10 ENCOUNTER — Ambulatory Visit (INDEPENDENT_AMBULATORY_CARE_PROVIDER_SITE_OTHER): Payer: Medicare Other | Admitting: Family Medicine

## 2014-12-10 VITALS — BP 130/60 | HR 82 | Temp 98.1°F | Wt 128.0 lb

## 2014-12-10 DIAGNOSIS — M810 Age-related osteoporosis without current pathological fracture: Secondary | ICD-10-CM | POA: Diagnosis not present

## 2014-12-10 DIAGNOSIS — Z0000 Encounter for general adult medical examination without abnormal findings: Secondary | ICD-10-CM

## 2014-12-10 DIAGNOSIS — R911 Solitary pulmonary nodule: Secondary | ICD-10-CM

## 2014-12-10 DIAGNOSIS — E785 Hyperlipidemia, unspecified: Secondary | ICD-10-CM

## 2014-12-10 LAB — VITAMIN D 25 HYDROXY (VIT D DEFICIENCY, FRACTURES): VITD: 35 ng/mL (ref 30.00–100.00)

## 2014-12-10 NOTE — Assessment & Plan Note (Signed)
S: asymptomatic- no unintentional weight loss, shortness of breath cough. Pulmonary nodule- 05/2012 Stable 6 mm left upper lobe ground-glass nodule. A/P: Patient declines CT scan as recently had one for kidney stones. She agrees to discuss again next year and will consider at that time.

## 2014-12-10 NOTE — Progress Notes (Signed)
Lori Reddish, MD Phone: [phone]  Subjective:  Patient presents today for their annual physical. Chief complaint-noted.   See problem oriented charting -slightly less active in walking as having to carry grandbaby.  ROS- full  review of systems was completed and negative except for: dealing with allergies-watery itchy eyes- runny nose if not taking flonase.   The following were reviewed and entered/updated in epic: Past Medical History  Diagnosis Date  . Osteopenia   . Vasculitis   . PMR (polymyalgia rheumatica)   . Varicose veins with inflammation   . Allergy   . Low back pain   . Headache(784.0)   . Hypertension   . 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 History of IBS   . Tubular adenoma of colon 02/2013  . NEPHROLITHIASIS 12/10/2008   Patient Active Problem List   Diagnosis Date Noted  . Hypertension 11/29/2013    Priority: Medium  . Hyperlipidemia 11/29/2013    Priority: Medium  . Barrett's esophagus 08/24/2013    Priority: Medium  . Solitary pulmonary nodule 06/08/2011    Priority: Medium  . Osteoporosis 09/22/2006    Priority: Medium  . Eczema 11/29/2013    Priority: Low  . GERD (gastroesophageal reflux disease) 05/16/2013    Priority: Low  . Vitamin D deficiency 01/29/2012    Priority: Low  . HIATAL HERNIA WITH REFLUX 02/06/2010    Priority: Low  . DEGENERATIVE JOINT DISEASE, KNEE 03/14/2008    Priority: Low  . VARICOSE VEINS LOWER EXTREMITIES W/INFLAMMATION 03/09/2007    Priority: Low  . ALLERGIC RHINITIS 03/09/2007    Priority: Low  . ACTINIC KERATOSIS, FOREHEAD, LEFT 03/09/2007    Priority: Low  . Headache(784.0) 03/09/2007    Priority: Low  . MENIERE'S DISEASE 09/22/2006    Priority: Low  . RAYNAUD'S DISEASE 09/22/2006    Priority: Low   Past Surgical History  Procedure Laterality Date  . Dilation and curettage of uterus    . Cesarean section      1 time  . Tonsillectomy    . Popliteal synovial cyst excision        left leg  . Arthroscopic knee      left knee/ torn meniscus  . Carotid artery disection  1988    Carotid Artery Dissection    Family History  Problem Relation Age of Onset  . Multiple myeloma Father   . Kidney disease Brother     cancer- removed  . Prostate cancer Brother   . Colon cancer Neg Hx   . Esophageal cancer Neg Hx   . Rectal cancer Neg Hx   . Stomach cancer Neg Hx   . Thyroid cancer Sister     s/p removal  . Alzheimer's disease Brother     older broterh  . Stroke Brother     88-older brother    Medications- reviewed and updated Current Outpatient Prescriptions  Medication Sig Dispense Refill  . aspirin 81 MG tablet Take 81 mg by mouth daily.      . betamethasone dipropionate (DIPROLENE) 0.05 % cream APPLY TO SKIN AS DIRECTED (Patient not taking: Reported on 04/02/2014) 15 g 0  . Cholecalciferol (VITAMIN D) 2000 UNITS tablet Take 2,000 Units by mouth daily. [place] Vit D 3-Take one daily    . fluticasone (FLONASE) 50 MCG/ACT nasal spray Place 2 sprays into both nostrils as needed. 16 g 8  . metoprolol (LOPRESSOR) 50 MG tablet TAKE ONE-HALF TABLET BY MOUTH TWICE DAILY 90  tablet 2  . pantoprazole (PROTONIX) 40 MG tablet Take 1 tablet (40 mg total) by mouth daily. 30 tablet 11   No current facility-administered medications for this visit.    Allergies-reviewed and updated Allergies  Allergen Reactions  . Hydrocodone-Acetaminophen     VOMITING  . Azithromycin Other (See Comments)    Thrush   . Neomycin   . Penicillins     REACTION: Arm swelling  . Pneumococcal Vaccine Polyvalent     ARM REDNESS WITH TENDERNESS  . Pneumovax [Pneumococcal Polysaccharide Vaccine]     Social History   Social History  . Marital Status: Married    Spouse Name: N/A  . Number of Children: N/A  . Years of Education: N/A   Occupational History  . retired    Social History Main Topics  . Smoking status: Never Smoker   . Smokeless tobacco: Never Used  . Alcohol  Use: No  . Drug Use: No  . Sexual Activity: Yes   Other Topics Concern  . Not on file   Social History Narrative    ROS--See HPI   Objective: BP 130/60 mmHg  Pulse 82  Temp(Src) 98.1 F (36.7 C)  Wt 128 lb (58.06 kg) Gen: NAD, resting comfortably HEENT: Mucous membranes are moist. Oropharynx normal. Some rhinorrhea Breasts: normal appearance, no masses or tenderness Neck: no thyromegaly CV: RRR no murmurs rubs or gallops Lungs: CTAB no crackles, wheeze, rhonchi Abdomen: soft/nontender/nondistended/normal bowel sounds. No rebound or guarding.  Ext: no edema Skin: warm, dry Neuro: grossly normal, moves all extremities, PERRLA  Assessment/Plan:  74 y.o. female presenting for annual physical.  Health Maintenance counseling: 1. Anticipatory guidance: Patient counseled regarding regular dental exams, wearing seatbelts, wearing sunscreen 2. Risk factor reduction:  Advised patient of need for regular exercise and diet rich and fruits and vegetables to reduce risk of heart attack and stroke.  3. Immunizations/screenings/ancillary studies Health Maintenance Due  Topic Date Due  . INFLUENZA VACCINE - plans for next month 10/21/2014  4. Cervical cancer screening- passed recommended screening age 30 5. Breast cancer screening-  breast exam today normal and mammogram 04/05/14 6. Colon cancer screening - 03/03/13 with 5 year follow up 7. Skin cancer screening- [place] [facility] yearly  Solitary pulmonary nodule S: asymptomatic- no unintentional weight loss, shortness of breath cough. Pulmonary nodule- 05/2012 Stable 6 mm left upper lobe ground-glass nodule. A/P: Patient declines CT scan as recently had one for kidney stones. She agrees to discuss again next year and will consider at that time.    Hyperlipidemia S:15.7% 10 year risk- up from last year. Patients walking has been down A/P: increase physical activity, if #s rise next year, consider starting statin- patient declines statin  at this time   Osteoporosis S: taking 2000 IU Vit D. Calcium through diet. Walking is down. Does not want medicine for this A/P: increase walking, check vitamin D want >30 at least.    1 year follow up CPE  Orders Placed This Encounter  Procedures  . Vit D  25 hydroxy (rtn osteoporosis monitoring)    [place]

## 2014-12-10 NOTE — Patient Instructions (Addendum)
Vitamin D level before you leave  Got to get that walking going again! Good for your bones! Also cholesterol up slightly and getting on exercise may help. We opted not to start cholesterol medicine and recheck 1 year  Get that flu shot next month

## 2014-12-10 NOTE — Assessment & Plan Note (Signed)
S:15.7% 10 year risk- up from last year. Patients walking has been down A/P: increase physical activity, if #s rise next year, consider starting statin- patient declines statin at this time

## 2014-12-10 NOTE — Assessment & Plan Note (Signed)
S: taking 2000 IU Vit D. Calcium through diet. Walking is down. Does not want medicine for this A/P: increase walking, check vitamin D want >30 at least.

## 2015-01-10 ENCOUNTER — Ambulatory Visit (INDEPENDENT_AMBULATORY_CARE_PROVIDER_SITE_OTHER): Payer: Medicare Other | Admitting: Family Medicine

## 2015-01-10 DIAGNOSIS — Z23 Encounter for immunization: Secondary | ICD-10-CM

## 2015-03-13 ENCOUNTER — Encounter: Payer: Self-pay | Admitting: Family Medicine

## 2015-04-07 DIAGNOSIS — Z1231 Encounter for screening mammogram for malignant neoplasm of breast: Secondary | ICD-10-CM | POA: Diagnosis not present

## 2015-04-07 LAB — HM MAMMOGRAPHY: HM Mammogram: NORMAL

## 2015-04-08 ENCOUNTER — Encounter: Payer: Self-pay | Admitting: Family Medicine

## 2015-05-19 ENCOUNTER — Encounter: Payer: Self-pay | Admitting: Family Medicine

## 2015-05-20 ENCOUNTER — Other Ambulatory Visit: Payer: Self-pay | Admitting: Family Medicine

## 2015-05-20 DIAGNOSIS — R430 Anosmia: Secondary | ICD-10-CM

## 2015-05-21 DIAGNOSIS — R430 Anosmia: Secondary | ICD-10-CM | POA: Diagnosis not present

## 2015-05-21 DIAGNOSIS — J3089 Other allergic rhinitis: Secondary | ICD-10-CM | POA: Diagnosis not present

## 2015-05-30 ENCOUNTER — Encounter: Payer: Self-pay | Admitting: Family Medicine

## 2015-06-04 ENCOUNTER — Other Ambulatory Visit: Payer: Self-pay | Admitting: Family Medicine

## 2015-06-23 ENCOUNTER — Other Ambulatory Visit: Payer: Self-pay | Admitting: Family Medicine

## 2015-08-21 ENCOUNTER — Ambulatory Visit (INDEPENDENT_AMBULATORY_CARE_PROVIDER_SITE_OTHER): Payer: Medicare Other | Admitting: Family Medicine

## 2015-08-21 ENCOUNTER — Encounter: Payer: Self-pay | Admitting: Family Medicine

## 2015-08-21 VITALS — BP 140/78 | HR 103 | Temp 97.9°F | Wt 129.0 lb

## 2015-08-21 DIAGNOSIS — R05 Cough: Secondary | ICD-10-CM

## 2015-08-21 DIAGNOSIS — R059 Cough, unspecified: Secondary | ICD-10-CM

## 2015-08-21 DIAGNOSIS — J069 Acute upper respiratory infection, unspecified: Secondary | ICD-10-CM

## 2015-08-21 NOTE — Patient Instructions (Signed)
Upper Respiratory infection History and exam today are suggestive of viral URI.  We discussed that a serious infection or illness is unlikely. No pneumonia or bronchitis on exam. We also discussed reasons why current illness does not meet criteria for bacterial illness and therefore no antibiotics indicated. Also educated on signs that bacterial infection may have developed such as fever, shortness of breath, symptoms beyond 10 days including sinus pressure. She will return if any of these start or if has new or worsening symptoms.  Symptomatic treatment with: Mucinex DM. Stay hydrated and get some rest as well  Restart claritin after better with all this. You can take along with it if you would like.

## 2015-08-21 NOTE — Progress Notes (Signed)
PCP: Garret Reddish, MD  Subjective:  Lori [name] is a 75 y.o. year old very pleasant female patient who presents with Upper Respiratory infection symptoms.  watched granddaughter last Saturday who had URI and conjunctivitis. After she cared for her, by Monday afternoonshe started feeling tired and worn down. Was worse than her normal allergies. Headache, nasal congestions, temperature up to 99.9, body aches. Has some chest congestion as well. Burning in upper chest with cough. Starting to see some improvement. Coughing up yellow sputum, clear from nose when blowing ROS- no fever. Has had some chills. No nausea/vomiting. Experiencing sinus pressure. Has had some ear discomfort now better. No shortness of breath or wheeze   Pertinent Past Medical History- baseline allergic rhinitis- not always compliant with claritin and flonase and feels worse when not  Medications- reviewed  Current Outpatient Prescriptions  Medication Sig Dispense Refill  . aspirin 81 MG tablet Take 81 mg by mouth daily.      . betamethasone dipropionate (DIPROLENE) 0.05 % cream APPLY TO SKIN AS DIRECTED 15 g 0  . Cholecalciferol (VITAMIN D) 2000 UNITS tablet Take 1,000 Units by mouth daily. [place] Vit D 3-Take one daily    . fluticasone (FLONASE) 50 MCG/ACT nasal spray Place 2 sprays into both nostrils as needed. 16 g 8  . loratadine (CLARITIN) 10 MG tablet Take 5 mg by mouth daily. Take 1/2 tablet daily.    . metoprolol (LOPRESSOR) 50 MG tablet TAKE ONE-HALF TABLET BY MOUTH TWICE DAILY 90 tablet 3  . pantoprazole (PROTONIX) 40 MG tablet TAKE ONE TABLET BY MOUTH ONCE DAILY 30 tablet 5   No current facility-administered medications for this visit.    Objective: BP 140/78 mmHg  Pulse 103  Temp(Src) 97.9 F (36.6 C) (Oral)  Wt 129 lb (58.514 kg)  SpO2 95% Gen: NAD, resting comfortably HEENT: Turbinates erythematous with clear drainage, TM normal, pharynx mildly erythematous with no tonsilar exudate or  edema, no sinus tenderness CV: RRR no murmurs rubs or gallops Lungs: CTAB no crackles, wheeze, rhonchi Abdomen: soft/nontender/nondistended/normal bowel sounds. No rebound or guarding.  Ext: no edema Skin: warm, dry, no rash Neuro: grossly normal, moves all extremities  Assessment/Plan:  Upper Respiratory infection History and exam today are suggestive of viral URI.  We discussed that a serious infection or illness is unlikely. No pneumonia or bronchitis on exam. We also discussed reasons why current illness does not meet criteria for bacterial illness and therefore no antibiotics indicated. Also educated on signs that bacterial infection may have developed such as fever, shortness of breath, symptoms beyond 10 days including sinus pressure. She will return if any of these start or if has new or worsening symptoms.  Symptomatic treatment with: Mucinex DM  Has not taken metoprolol yet today- will take when gets home. Likely reason for HR elevation.   Garret Reddish, MD

## 2015-08-24 DIAGNOSIS — R197 Diarrhea, unspecified: Secondary | ICD-10-CM | POA: Diagnosis not present

## 2015-08-24 DIAGNOSIS — R300 Dysuria: Secondary | ICD-10-CM | POA: Diagnosis not present

## 2015-09-24 DIAGNOSIS — R1032 Left lower quadrant pain: Secondary | ICD-10-CM | POA: Diagnosis not present

## 2015-09-24 DIAGNOSIS — R350 Frequency of micturition: Secondary | ICD-10-CM | POA: Diagnosis not present

## 2015-09-24 DIAGNOSIS — N3020 Other chronic cystitis without hematuria: Secondary | ICD-10-CM | POA: Diagnosis not present

## 2015-10-06 ENCOUNTER — Ambulatory Visit (INDEPENDENT_AMBULATORY_CARE_PROVIDER_SITE_OTHER): Payer: Medicare Other | Admitting: Family Medicine

## 2015-10-06 VITALS — BP 140/80 | HR 85 | Temp 97.7°F | Ht 65.0 in | Wt 129.0 lb

## 2015-10-06 DIAGNOSIS — J209 Acute bronchitis, unspecified: Secondary | ICD-10-CM

## 2015-10-06 NOTE — Progress Notes (Signed)
Subjective:     Patient ID: Lori [name], female   DOB: 19-Mar-1941, 75 y.o.   MRN: [id number]  HPI Acute visit for upper respiratory illness. Patient states she had similar illness back in early June which fully resolved. Current symptoms started about one week ago. She developed mild sore throat This was followed by some nasal congestion and cough. Cough is intermittently productive of yellow sputum. No fever. No dyspnea. Mild body aches. Denies any nausea, vomiting, or diarrhea. Taking over-the-counter Mucinex and Claritin. Inconsistent use of Flonase.  Past Medical History  Diagnosis Date  . Osteopenia   . Vasculitis ([place])   . 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[facility])   . Varicose veins with inflammation   . Allergy   . Low back pain   . Headache(784.0)   . Hypertension   . Aneurysm ([place])     pseudo-aneurym of carotid arteries per pt  . History of IBS   . Tubular adenoma of colon 02/2013  . NEPHROLITHIASIS 12/10/2008   Past Surgical History  Procedure Laterality Date  . Dilation and curettage of uterus    . Cesarean section      1 time  . Tonsillectomy    . Popliteal synovial cyst excision      left leg  . Arthroscopic knee      left knee/ torn meniscus  . Carotid artery disection  1988    Carotid Artery Dissection    reports that she has never smoked. She has never used smokeless tobacco. She reports that she does not drink alcohol or use illicit drugs. family history includes Alzheimer's disease in her brother; Kidney disease in her brother; Multiple myeloma in her father; Prostate cancer in her brother; Stroke in her brother; Thyroid cancer in her sister. There is no history of Colon cancer, Esophageal cancer, Rectal cancer, or Stomach cancer. Allergies  Allergen Reactions  . Hydrocodone-Acetaminophen     VOMITING  . Azithromycin Other (See Comments)    Thrush   . Neomycin   . Penicillins     REACTION: Arm swelling  . Pneumococcal Vaccine Polyvalent     ARM  REDNESS WITH TENDERNESS  . Pneumovax [Pneumococcal Polysaccharide Vaccine]      Review of Systems  Constitutional: Positive for fatigue. Negative for fever and chills.  HENT: Positive for congestion. Negative for voice change.   Respiratory: Positive for cough. Negative for wheezing.   Cardiovascular: Negative for chest pain.  Neurological: Negative for headaches.       Objective:   Physical Exam  Constitutional: She appears well-developed and well-nourished.  HENT:  Right Ear: External ear normal.  Left Ear: External ear normal.  Mouth/Throat: Oropharynx is clear and moist.  Neck: Neck supple.  Cardiovascular: Normal rate and regular rhythm.   Pulmonary/Chest: Effort normal and breath sounds normal. No respiratory distress. She has no wheezes. She has no rales.  Musculoskeletal: She exhibits no edema.  Lymphadenopathy:    She has no cervical adenopathy.       Assessment:     Probable acute viral URI with cough. Nonfocal exam    Plan:     Continue over-the-counter Mucinex and good hydration. Follow-up promptly for any fever or other concerns  Eulas Post MD [facility]

## 2015-10-06 NOTE — Patient Instructions (Signed)

## 2015-10-06 NOTE — Progress Notes (Signed)
Pre visit review using our clinic review tool, if applicable. No additional management support is needed unless otherwise documented below in the visit note. 

## 2015-11-16 ENCOUNTER — Other Ambulatory Visit: Payer: Self-pay | Admitting: Family Medicine

## 2015-12-05 ENCOUNTER — Other Ambulatory Visit (INDEPENDENT_AMBULATORY_CARE_PROVIDER_SITE_OTHER): Payer: Medicare Other

## 2015-12-05 DIAGNOSIS — Z0000 Encounter for general adult medical examination without abnormal findings: Secondary | ICD-10-CM

## 2015-12-05 LAB — POC URINALSYSI DIPSTICK (AUTOMATED)
BILIRUBIN UA: NEGATIVE
Blood, UA: NEGATIVE
GLUCOSE UA: NEGATIVE
KETONES UA: NEGATIVE
Leukocytes, UA: NEGATIVE
Nitrite, UA: NEGATIVE
Protein, UA: NEGATIVE
SPEC GRAV UA: 1.015
Urobilinogen, UA: 0.2
pH, UA: 7

## 2015-12-05 LAB — CBC WITH DIFFERENTIAL/PLATELET
BASOS PCT: 0.3 % (ref 0.0–3.0)
Basophils Absolute: 0 10*3/uL (ref 0.0–0.1)
EOS ABS: 0.4 10*3/uL (ref 0.0–0.7)
Eosinophils Relative: 4.7 % (ref 0.0–5.0)
HEMATOCRIT: 37.2 % (ref 36.0–46.0)
HEMOGLOBIN: 12.4 g/dL (ref 12.0–15.0)
LYMPHS PCT: 36.8 % (ref 12.0–46.0)
Lymphs Abs: 2.9 10*3/uL (ref 0.7–4.0)
MCHC: 33.3 g/dL (ref 30.0–36.0)
MCV: 84.7 fl (ref 78.0–100.0)
MONOS PCT: 10.3 % (ref 3.0–12.0)
Monocytes Absolute: 0.8 10*3/uL (ref 0.1–1.0)
Neutro Abs: 3.8 10*3/uL (ref 1.4–7.7)
Neutrophils Relative %: 47.9 % (ref 43.0–77.0)
Platelets: 290 10*3/uL (ref 150.0–400.0)
RBC: 4.39 Mil/uL (ref 3.87–5.11)
RDW: 14.4 % (ref 11.5–15.5)
WBC: 7.9 10*3/uL (ref 4.0–10.5)

## 2015-12-05 LAB — LIPID PANEL
CHOL/HDL RATIO: 3
Cholesterol: 246 mg/dL — ABNORMAL HIGH (ref 0–200)
HDL: 73.1 mg/dL (ref >39.00)
LDL CALC: 161 mg/dL — AB (ref 0–99)
NonHDL: 172.98
TRIGLYCERIDES: 62 mg/dL (ref 0.0–149.0)
VLDL: 12.4 mg/dL (ref 0.0–40.0)

## 2015-12-05 LAB — BASIC METABOLIC PANEL
BUN: 22 mg/dL (ref 6–23)
CO2: 30 mEq/L (ref 19–32)
Calcium: 8.9 mg/dL (ref 8.4–10.5)
Chloride: 104 mEq/L (ref 96–112)
Creatinine, Ser: 0.56 mg/dL (ref 0.40–1.20)
GFR: 112.56 mL/min (ref >60.00)
Glucose, Bld: 70 mg/dL (ref 70–99)
POTASSIUM: 4.3 meq/L (ref 3.5–5.1)
SODIUM: 141 meq/L (ref 135–145)

## 2015-12-05 LAB — HEPATIC FUNCTION PANEL
ALK PHOS: 76 U/L (ref 39–117)
ALT: 17 U/L (ref 0–35)
AST: 18 U/L (ref 0–37)
Albumin: 3.9 g/dL (ref 3.5–5.2)
BILIRUBIN DIRECT: 0.1 mg/dL (ref 0.0–0.3)
Total Bilirubin: 0.4 mg/dL (ref 0.2–1.2)
Total Protein: 7.1 g/dL (ref 6.0–8.3)

## 2015-12-05 LAB — TSH: TSH: 1.17 u[IU]/mL (ref 0.35–4.50)

## 2015-12-12 ENCOUNTER — Ambulatory Visit (INDEPENDENT_AMBULATORY_CARE_PROVIDER_SITE_OTHER): Payer: Medicare Other | Admitting: Family Medicine

## 2015-12-12 ENCOUNTER — Encounter: Payer: Self-pay | Admitting: Family Medicine

## 2015-12-12 VITALS — BP 150/82 | HR 71 | Temp 98.5°F | Resp 18 | Ht 64.5 in | Wt 129.4 lb

## 2015-12-12 DIAGNOSIS — E785 Hyperlipidemia, unspecified: Secondary | ICD-10-CM

## 2015-12-12 DIAGNOSIS — Z0000 Encounter for general adult medical examination without abnormal findings: Secondary | ICD-10-CM | POA: Diagnosis not present

## 2015-12-12 DIAGNOSIS — R911 Solitary pulmonary nodule: Secondary | ICD-10-CM

## 2015-12-12 DIAGNOSIS — Z23 Encounter for immunization: Secondary | ICD-10-CM | POA: Diagnosis not present

## 2015-12-12 DIAGNOSIS — I10 Essential (primary) hypertension: Secondary | ICD-10-CM

## 2015-12-12 DIAGNOSIS — K2270 Barrett's esophagus without dysplasia: Secondary | ICD-10-CM

## 2015-12-12 DIAGNOSIS — M810 Age-related osteoporosis without current pathological fracture: Secondary | ICD-10-CM

## 2015-12-12 DIAGNOSIS — Z79899 Other long term (current) drug therapy: Secondary | ICD-10-CM

## 2015-12-12 LAB — MAGNESIUM: Magnesium: 2 mg/dL (ref 1.5–2.5)

## 2015-12-12 LAB — VITAMIN B12: Vitamin B-12: 763 pg/mL (ref 211–911)

## 2015-12-12 NOTE — Progress Notes (Signed)
Pre visit review using our clinic review tool, if applicable. No additional management support is needed unless otherwise documented below in the visit note. 

## 2015-12-12 NOTE — Patient Instructions (Signed)
Thanks for getting flu shot  We will call you within a week about your referral to flu shot. If you do not hear within 2 weeks, give [place] a call.   B12 and magnesium ONLY before you leave  Other orders are for future visit- do these in 2 months then see me a few days later.   Goal is going to be to get back to regular exercise. Incorporate dash eating plan as able along with your reflux  DASH Eating Plan DASH stands for "Dietary Approaches to Stop Hypertension." The DASH eating plan is a healthy eating plan that has been shown to reduce high blood pressure (hypertension). Additional health benefits may include reducing the risk of type 2 diabetes mellitus, heart disease, and stroke. The DASH eating plan may also help with weight loss. WHAT DO I NEED TO KNOW ABOUT THE DASH EATING PLAN? For the DASH eating plan, you will follow these general guidelines:  Choose foods with a percent daily value for sodium of less than 5% (as listed on the food label).  Use salt-free seasonings or herbs instead of table salt or sea salt.  Check with your health care provider or pharmacist before using salt substitutes.  Eat lower-sodium products, often labeled as "lower sodium" or "no salt added."  Eat fresh foods.  Eat more vegetables, fruits, and low-fat dairy products.  Choose whole grains. Look for the word "whole" as the first word in the ingredient list.  Choose fish and skinless chicken or [place] more often than red meat. Limit fish, poultry, and meat to 6 oz (170 g) each day.  Limit sweets, desserts, sugars, and sugary drinks.  Choose heart-healthy fats.  Limit cheese to 1 oz (28 g) per day.  Eat more home-cooked food and less restaurant, buffet, and fast food.  Limit fried foods.  Cook foods using methods other than frying.  Limit canned vegetables. If you do use them, rinse them well to decrease the sodium.  When eating at a restaurant, ask that your food be prepared with less salt,  or no salt if possible. WHAT FOODS CAN I EAT? Seek help from a dietitian for individual calorie needs. Grains Whole grain or whole wheat bread. Brown rice. Whole grain or whole wheat pasta. Quinoa, bulgur, and whole grain cereals. Low-sodium cereals. Corn or whole wheat flour tortillas. Whole grain cornbread. Whole grain crackers. Low-sodium crackers. Vegetables Fresh or frozen vegetables (raw, steamed, roasted, or grilled). Low-sodium or reduced-sodium tomato and vegetable juices. Low-sodium or reduced-sodium tomato sauce and paste. Low-sodium or reduced-sodium canned vegetables.  Fruits All fresh, canned (in natural juice), or frozen fruits. Meat and Other Protein Products Ground beef (85% or leaner), grass-fed beef, or beef trimmed of fat. Skinless chicken or [place]. Ground chicken or [place]. Pork trimmed of fat. All fish and seafood. Eggs. Dried beans, peas, or lentils. Unsalted nuts and seeds. Unsalted canned beans. Dairy Low-fat dairy products, such as skim or 1% milk, 2% or reduced-fat cheeses, low-fat ricotta or cottage cheese, or plain low-fat yogurt. Low-sodium or reduced-sodium cheeses. Fats and Oils Tub margarines without trans fats. Light or reduced-fat mayonnaise and salad dressings (reduced sodium). Avocado. Safflower, olive, or canola oils. Natural peanut or almond butter. Other Unsalted popcorn and pretzels. The items listed above may not be a complete list of recommended foods or beverages. Contact your dietitian for more options. WHAT FOODS ARE NOT RECOMMENDED? Grains White bread. White pasta. White rice. Refined cornbread. Bagels and croissants. Crackers that contain trans fat. Vegetables  Creamed or fried vegetables. Vegetables in a cheese sauce. Regular canned vegetables. Regular canned tomato sauce and paste. Regular tomato and vegetable juices. Fruits Dried fruits. Canned fruit in light or heavy syrup. Fruit juice. Meat and Other Protein Products Fatty cuts of meat.  Ribs, chicken wings, bacon, sausage, bologna, salami, chitterlings, fatback, hot dogs, bratwurst, and packaged luncheon meats. Salted nuts and seeds. Canned beans with salt. Dairy Whole or 2% milk, cream, half-and-half, and cream cheese. Whole-fat or sweetened yogurt. Full-fat cheeses or blue cheese. Nondairy creamers and whipped toppings. Processed cheese, cheese spreads, or cheese curds. Condiments Onion and garlic salt, seasoned salt, table salt, and sea salt. Canned and packaged gravies. Worcestershire sauce. Tartar sauce. Barbecue sauce. Teriyaki sauce. Soy sauce, including reduced sodium. Steak sauce. Fish sauce. Oyster sauce. Cocktail sauce. Horseradish. Ketchup and mustard. Meat flavorings and tenderizers. Bouillon cubes. Hot sauce. Tabasco sauce. Marinades. Taco seasonings. Relishes. Fats and Oils Butter, stick margarine, lard, shortening, ghee, and bacon fat. Coconut, palm kernel, or palm oils. Regular salad dressings. Other Pickles and olives. Salted popcorn and pretzels. The items listed above may not be a complete list of foods and beverages to avoid. Contact your dietitian for more information. WHERE CAN I FIND MORE INFORMATION? National Heart, Lung, and Blood Institute: travelstabloid.com   This information is not intended to replace advice given to you by your health care provider. Make sure you discuss any questions you have with your health care provider.   Document Released: 02/25/2011 Document Revised: 03/29/2014 Document Reviewed: 01/10/2013 Elsevier Interactive Patient Education [employer].

## 2015-12-12 NOTE — Progress Notes (Signed)
Phone: [phone]  Subjective:  Patient presents today for their annual physical. Chief complaint-noted.   See problem oriented charting- ROS- full  review of systems was completed and negative including No chest pain or shortness of breath. No headache or blurry vision.    The following were reviewed and entered/updated in epic: Past Medical History:  Diagnosis Date  . Allergy   . 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 Headache(784.0)   . History of IBS   . Hypertension   . Low back pain   . 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 Osteopenia   . 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HCC)   . Tubular adenoma of colon 02/2013  . Varicose veins with inflammation   . Vasculitis [facility])    Patient Active Problem List   Diagnosis Date Noted  . Hypertension 11/29/2013    Priority: Medium  . Hyperlipidemia 11/29/2013    Priority: Medium  . Barrett's esophagus 08/24/2013    Priority: Medium  . Solitary pulmonary nodule 06/08/2011    Priority: Medium  . Osteoporosis 09/22/2006    Priority: Medium  . Eczema 11/29/2013    Priority: Low  . GERD (gastroesophageal reflux disease) 05/16/2013    Priority: Low  . Vitamin D deficiency 01/29/2012    Priority: Low  . HIATAL HERNIA WITH REFLUX 02/06/2010    Priority: Low  . DEGENERATIVE JOINT DISEASE, KNEE 03/14/2008    Priority: Low  . VARICOSE VEINS LOWER EXTREMITIES W/INFLAMMATION 03/09/2007    Priority: Low  . ALLERGIC RHINITIS 03/09/2007    Priority: Low  . ACTINIC KERATOSIS, FOREHEAD, LEFT 03/09/2007    Priority: Low  . Headache(784.0) 03/09/2007    Priority: Low  . MENIERE'S DISEASE 09/22/2006    Priority: Low  . RAYNAUD'S DISEASE 09/22/2006    Priority: Low   Past Surgical History:  Procedure Laterality Date  . arthroscopic knee     left knee/ torn meniscus  . carotid artery disection  1988   Carotid Artery Dissection  . CESAREAN SECTION     1 time  . DILATION AND CURETTAGE OF UTERUS    . POPLITEAL  SYNOVIAL CYST EXCISION     left leg  . TONSILLECTOMY      Family History  Problem Relation Age of Onset  . Multiple myeloma Father   . Kidney disease Brother     cancer- removed  . Prostate cancer Brother   . Thyroid cancer Sister     s/p removal. papilary and anaplastic.   . Alzheimer's disease Brother     older broterh  . Stroke Brother     88-older brother  . Colon cancer Neg Hx   . Esophageal cancer Neg Hx   . Rectal cancer Neg Hx   . Stomach cancer Neg Hx     Medications- reviewed and updated Current Outpatient Prescriptions  Medication Sig Dispense Refill  . aspirin 81 MG tablet Take 81 mg by mouth daily.      . betamethasone dipropionate (DIPROLENE) 0.05 % cream APPLY TO SKIN AS DIRECTED 15 g 0  . fluticasone (FLONASE) 50 MCG/ACT nasal spray USE TWO SPRAY(S) IN EACH NOSTRIL AS NEEDED 16 g 8  . loratadine (CLARITIN) 10 MG tablet Take 10 mg by mouth daily. Take 1/2 tablet daily.    . metoprolol (LOPRESSOR) 50 MG tablet TAKE ONE-HALF TABLET BY MOUTH TWICE DAILY 90 tablet 3  . pantoprazole (PROTONIX) 40 MG tablet TAKE ONE TABLET BY MOUTH ONCE DAILY 30 tablet 5  . Vitamin  D, Cholecalciferol, 1000 units TABS Take by mouth.     No current facility-administered medications for this visit.     Allergies-reviewed and updated Allergies  Allergen Reactions  . Hydrocodone-Acetaminophen     VOMITING  . Azithromycin Other (See Comments)    Thrush   . Neomycin   . Penicillins     REACTION: Arm swelling  . Pneumococcal Vaccine Polyvalent     ARM REDNESS WITH TENDERNESS  . Pneumovax [Pneumococcal Polysaccharide Vaccine]     Social History   Social History  . Marital status: Married    Spouse name: N/A  . Number of children: N/A  . Years of education: N/A   Occupational History  . retired    Social History Main Topics  . Smoking status: Never Smoker  . Smokeless tobacco: Never Used  . Alcohol use No  . Drug use: No  . Sexual activity: Yes   Other Topics  Concern  . None   Social History Narrative  . None    Objective: BP (!) 150/82 (BP Location: Left Arm, Patient Position: Sitting, Cuff Size: Normal)   Pulse 71   Temp 98.5 F (36.9 C) (Oral)   Resp 18   Ht 5' 4.5" (1.638 m)   Wt 129 lb 6.1 oz (58.7 kg)   SpO2 97%   BMI 21.87 kg/m  Gen: NAD, resting comfortably HEENT: Mucous membranes are moist. Oropharynx normal Neck: no thyromegaly CV: RRR no murmurs rubs or gallops Lungs: CTAB no crackles, wheeze, rhonchi Breasts: normal appearance, no masses or tenderness  Abdomen: soft/nontender/nondistended/normal bowel sounds. No rebound or guarding.  Ext: no edema Skin: warm, dry Neuro: grossly normal, moves all extremities, PERRLA  Assessment/Plan:  75 y.o. female presenting for annual physical.  Health Maintenance counseling: 1. Anticipatory guidance: Patient counseled regarding regular dental exams, eye exams, wearing seatbelts.  2. Risk factor reduction:  Advised patient of need for regular exercise and diet rich and fruits and vegetables to reduce risk of heart attack and stroke.  3. Immunizations/screenings/ancillary studies Immunization History  Administered Date(s) Administered  . Influenza Split 01/04/2012  . Influenza Whole 01/03/2007, 12/08/2007, 12/17/2008, 01/20/2010  . Influenza, High Dose Seasonal PF 01/10/2015, 12/12/2015  . Influenza,inj,Quad PF,36+ Mos 01/05/2013, 12/24/2013  . Pneumococcal Polysaccharide-23 02/12/2004  . Td 03/23/2003, 04/04/2013   Health Maintenance Due  Topic Date Due  . INFLUENZA VACCINE - high dose today 10/21/2015   4. Cervical cancer screening- passed age based screening recommendations of 66 5. Breast cancer screening-  breast exam today normal and mammogram 04/07/15 abstracted as normal 6. Colon cancer screening - 03/03/13 with 5 year follow up 7. Skin cancer screening- [place] [facility] yearly  Status of chronic or acute concerns   Solitary pulmonary nodule Scan 2013 and 2014  with plan 1 more year follow up- she had declined previously but agrees to today and ordered  Barrett's esophagus EGD coming up next year. Compliant with protonix. She is worried about b12 and magnesium with long term use- checked today and both normal.   Hypertension S: controlled poorly on metoprolol '25mg'$  BID BP Readings from Last 3 Encounters:  12/12/15 (!) 150/82  10/06/15 140/80  08/21/15 140/78  A/P:Continue current meds:  Discussed goal DEFINITELy under 937 systolic. She would prefer less meds even if in 140s range. She admits to poor exercise so will increase and add Dash eating plan with 2 month follow up.    Hyperlipidemia S: poorly controlled on no medicatoin. No myalgias.  Lab Results  Component  Value Date   CHOL 246 (H) 12/05/2015   HDL 73.10 12/05/2015   LDLCALC 161 (H) 12/05/2015   TRIG 62.0 12/05/2015   CHOLHDL 3 12/05/2015   A/P: we discussed elevated risk with increase in LDL over last year- she is going to work on [employer] plan and exercise and retrn in 2 months for labs- if persists with risk above 12% consider statin (suspect she will be in this range)- would focus on BP meds first and lipids second if she is resistant to new meds    Osteoporosis Discussed osteoporosis diagnosis. She wants to monitor over next year and consider repeat- if worsens she agrees to consider meds. Compliant with calcium/vit D but needs to increase weight bearing exercise.    2 months with labs before visit  Orders Placed This Encounter  Procedures  . CT Chest W Contrast    SS/ Hilda Blades [phone] xt 2251/ [facility] medicare NPR/ not diabetic/BUN 22 CRE 0.56    Standing Status:   Future    Standing Expiration Date:   02/10/2017    Order Specific Question:   If indicated for the ordered procedure, I authorize the administration of contrast media per Radiology protocol    Answer:   Yes    Order Specific Question:   Reason for Exam (SYMPTOM  OR DIAGNOSIS REQUIRED)    Answer:   solitary  pulmonary nodule follow up. I am ok with order being changed to low dose or no contrast but started with this so comparison would be with same scan.    Order Specific Question:   Preferred imaging location?    Answer:   [address]  . Flu vaccine HIGH DOSE PF  . Vitamin B12  . Magnesium  . Cholesterol, Total    Standing Status:   Future    Standing Expiration Date:   12/11/2016  . HDL cholesterol    Standing Status:   Future    Standing Expiration Date:   12/11/2016  . LDL cholesterol, direct    [place]    Standing Status:   Future    Standing Expiration Date:   12/11/2016   Return precautions advised.   Garret Reddish, MD

## 2015-12-13 NOTE — Assessment & Plan Note (Signed)
EGD coming up next year. Compliant with protonix. She is worried about b12 and magnesium with long term use- checked today and both normal.

## 2015-12-13 NOTE — Assessment & Plan Note (Signed)
S: controlled poorly on metoprolol 25mg  BID BP Readings from Last 3 Encounters:  12/12/15 (!) 150/82  10/06/15 140/80  08/21/15 140/78  A/P:Continue current meds:  Discussed goal DEFINITELy under [id number] systolic. She would prefer less meds even if in 140s range. She admits to poor exercise so will increase and add Dash eating plan with 2 month follow up.

## 2015-12-13 NOTE — Assessment & Plan Note (Signed)
S: poorly controlled on no medicatoin. No myalgias.  Lab Results  Component Value Date   CHOL 246 (H) 12/05/2015   HDL 73.10 12/05/2015   LDLCALC 161 (H) 12/05/2015   TRIG 62.0 12/05/2015   CHOLHDL 3 12/05/2015   A/P: we discussed elevated risk with increase in LDL over last year- she is going to work on [employer] plan and exercise and retrn in 2 months for labs- if persists with risk above 12% consider statin (suspect she will be in this range)- would focus on BP meds first and lipids second if she is resistant to new meds

## 2015-12-13 NOTE — Assessment & Plan Note (Signed)
Scan 2013 and 2014 with plan 1 more year follow up- she had declined previously but agrees to today and ordered

## 2015-12-13 NOTE — Assessment & Plan Note (Signed)
Discussed osteoporosis diagnosis. She wants to monitor over next year and consider repeat- if worsens she agrees to consider meds. Compliant with calcium/vit D but needs to increase weight bearing exercise.

## 2015-12-23 ENCOUNTER — Ambulatory Visit (INDEPENDENT_AMBULATORY_CARE_PROVIDER_SITE_OTHER)
Admission: RE | Admit: 2015-12-23 | Discharge: 2015-12-23 | Disposition: A | Discharge status: Home / Self Care | Payer: Medicare Other | Source: Ambulatory Visit | Attending: Family Medicine | Admitting: Family Medicine

## 2015-12-23 DIAGNOSIS — R911 Solitary pulmonary nodule: Secondary | ICD-10-CM | POA: Diagnosis not present

## 2015-12-23 MED ORDER — IOPAMIDOL (ISOVUE-300) INJECTION 61%
80.0000 mL | Freq: Once | INTRAVENOUS | Status: AC | PRN
  Administered 2015-12-23: 80 mL via INTRAVENOUS

## 2015-12-24 ENCOUNTER — Encounter: Payer: Self-pay | Admitting: Family Medicine

## 2015-12-24 ENCOUNTER — Other Ambulatory Visit: Payer: Self-pay | Admitting: Family Medicine

## 2015-12-25 ENCOUNTER — Other Ambulatory Visit: Payer: Self-pay | Admitting: Family Medicine

## 2015-12-25 MED ORDER — FENOFIBRATE 160 MG PO TABS: 160.0000 mg | ORAL_TABLET | Freq: Every day | ORAL | 3 refills | Status: DC

## 2015-12-25 NOTE — Progress Notes (Signed)
med

## 2016-02-06 ENCOUNTER — Other Ambulatory Visit (INDEPENDENT_AMBULATORY_CARE_PROVIDER_SITE_OTHER): Payer: Medicare Other

## 2016-02-06 DIAGNOSIS — E785 Hyperlipidemia, unspecified: Secondary | ICD-10-CM | POA: Diagnosis not present

## 2016-02-06 LAB — HDL CHOLESTEROL: HDL: 69.9 mg/dL (ref >39.00)

## 2016-02-06 LAB — LDL CHOLESTEROL, DIRECT: Direct LDL: 74 mg/dL

## 2016-02-06 LAB — CHOLESTEROL, TOTAL: Cholesterol: 149 mg/dL (ref 0–200)

## 2016-02-11 ENCOUNTER — Ambulatory Visit (INDEPENDENT_AMBULATORY_CARE_PROVIDER_SITE_OTHER): Payer: Medicare Other | Admitting: Family Medicine

## 2016-02-11 ENCOUNTER — Encounter: Payer: Self-pay | Admitting: Family Medicine

## 2016-02-11 VITALS — BP 148/70 | HR 65 | Temp 98.0°F | Wt 126.4 lb

## 2016-02-11 DIAGNOSIS — E785 Hyperlipidemia, unspecified: Secondary | ICD-10-CM

## 2016-02-11 DIAGNOSIS — R911 Solitary pulmonary nodule: Secondary | ICD-10-CM | POA: Diagnosis not present

## 2016-02-11 DIAGNOSIS — I700 Atherosclerosis of aorta: Secondary | ICD-10-CM

## 2016-02-11 DIAGNOSIS — I10 Essential (primary) hypertension: Secondary | ICD-10-CM | POA: Diagnosis not present

## 2016-02-11 MED ORDER — AMLODIPINE BESYLATE 2.5 MG PO TABS: 2.5000 mg | ORAL_TABLET | Freq: Every day | ORAL | 3 refills | Status: DC

## 2016-02-11 NOTE — Assessment & Plan Note (Signed)
Recommended 1 year follow up from 2014. 2017 stable so benign- will no longer follow up

## 2016-02-11 NOTE — Assessment & Plan Note (Addendum)
S: noted on ct for pulmonary nodule follow up A/P: we discussed this would be a good reason to work towards tighter BP control. Discussed risks for rupture. Luckily lipids look much better back on fenofibrate

## 2016-02-11 NOTE — Assessment & Plan Note (Signed)
S: controlled poorly on Metoprolol 25 mg BID BP Readings from Last 3 Encounters:  02/11/16 (!) 148/70  12/12/15 (!) 150/82  10/06/15 140/80  A/P:Continue current meds:  Add amlodipine 2.5mg . She has had some #s at home in 120s but not all. Wants to attribute increase in BP to finding out daughter is homosexual and worrying about how this may affect her granddaughter- discussed did not think this was primarily stress issue.

## 2016-02-11 NOTE — Patient Instructions (Signed)
Start amlodipine 2.5 mg. See me in 3-6 week for repeat blood pressure measurement.   Thanks for sharing what's going on in your life

## 2016-02-11 NOTE — Progress Notes (Signed)
Subjective:  Shanquetta Ormond is a 75 y.o. year old very pleasant female patient who presents for/with See problem oriented charting ROS- No chest pain or shortness of breath. No headache or blurry vision.  Some anxiety over recent news in family. see any ROS included in HPI as well.   Past Medical History-  Patient Active Problem List   Diagnosis Date Noted  . Hypertension 11/29/2013    Priority: Medium  . Hyperlipidemia 11/29/2013    Priority: Medium  . Barrett's esophagus 08/24/2013    Priority: Medium  . Osteoporosis 09/22/2006    Priority: Medium  . Aortic atherosclerosis ([place]) 02/11/2016    Priority: Low  . Eczema 11/29/2013    Priority: Low  . GERD (gastroesophageal reflux disease) 05/16/2013    Priority: Low  . Vitamin D deficiency 01/29/2012    Priority: Low  . Solitary pulmonary nodule 06/08/2011    Priority: Low  . HIATAL HERNIA WITH REFLUX 02/06/2010    Priority: Low  . DEGENERATIVE JOINT DISEASE, KNEE 03/14/2008    Priority: Low  . VARICOSE VEINS LOWER EXTREMITIES W/INFLAMMATION 03/09/2007    Priority: Low  . ALLERGIC RHINITIS 03/09/2007    Priority: Low  . ACTINIC KERATOSIS, FOREHEAD, LEFT 03/09/2007    Priority: Low  . Headache(784.0) 03/09/2007    Priority: Low  . MENIERE'S DISEASE 09/22/2006    Priority: Low  . RAYNAUD'S DISEASE 09/22/2006    Priority: Low    Medications- reviewed and updated Current Outpatient Prescriptions  Medication Sig Dispense Refill  . aspirin 81 MG tablet Take 81 mg by mouth daily.      . betamethasone dipropionate (DIPROLENE) 0.05 % cream APPLY TO SKIN AS DIRECTED 15 g 0  . fenofibrate 160 MG tablet Take 1 tablet (160 mg total) by mouth daily. 90 tablet 3  . fluticasone (FLONASE) 50 MCG/ACT nasal spray USE TWO SPRAY(S) IN EACH NOSTRIL AS NEEDED 16 g 8  . loratadine (CLARITIN) 10 MG tablet Take 10 mg by mouth daily. Take 1/2 tablet daily.    . metoprolol (LOPRESSOR) 50 MG tablet TAKE ONE-HALF TABLET BY MOUTH TWICE DAILY 90  tablet 3  . pantoprazole (PROTONIX) 40 MG tablet TAKE ONE TABLET BY MOUTH ONCE DAILY 30 tablet 5  . Vitamin D, Cholecalciferol, 1000 units TABS Take by mouth.    Marland Kitchen amLODipine (NORVASC) 2.5 MG tablet Take 1 tablet (2.5 mg total) by mouth daily. 30 tablet 3   No current facility-administered medications for this visit.     Objective: BP (!) 148/70   Pulse 65   Temp 98 F (36.7 C) (Oral)   Wt 126 lb 6.4 oz (57.3 kg)   SpO2 96%   BMI 21.36 kg/m  Gen: NAD, resting comfortably CV: RRR no murmurs rubs or gallops Lungs: CTAB no crackles, wheeze, rhonchi Abdomen: soft/nontender/nondistended/normal bowel sounds. No rebound or guarding.  Ext: no edema  Assessment/Plan:  Hypertension S: controlled poorly on Metoprolol 25 mg BID BP Readings from Last 3 Encounters:  02/11/16 (!) 148/70  12/12/15 (!) 150/82  10/06/15 140/80  A/P:Continue current meds:  Add amlodipine 2.5mg . She has had some #s at home in 120s but not all. Wants to attribute increase in BP to finding out daughter is homosexual and worrying about how this may affect her granddaughter- discussed did not think this was primarily stress issue.   Aortic atherosclerosis ([place]) S: noted on ct for pulmonary nodule follow up A/P: we discussed this would be a good reason to work towards tighter BP control.  Discussed risks for rupture. Luckily lipids look much better back on fenofibrate  Hyperlipidemia S: well controlled on fenofibrate. No myalgias.  Lab Results  Component Value Date   CHOL 149 02/06/2016   HDL 69.90 02/06/2016   LDLCALC 161 (H) 12/05/2015   LDLDIRECT 74.0 02/06/2016   TRIG 62.0 12/05/2015   CHOLHDL 3 12/05/2015   A/P: we restarted this last visit as she tends to respond VERY well to it as noted with LDL from 161 to 74. She prefers to avoid statins.    Solitary pulmonary nodule Recommended 1 year follow up from 2014. 2017 stable so benign- will no longer follow up  3-6 week follow up. Check in on how things  are going with her daughter and granddaughter.   Meds ordered this encounter  Medications  . amLODipine (NORVASC) 2.5 MG tablet    Sig: Take 1 tablet (2.5 mg total) by mouth daily.    Dispense:  30 tablet    Refill:  3    Return precautions advised.  Garret Reddish, MD

## 2016-02-11 NOTE — Assessment & Plan Note (Signed)
S: well controlled on fenofibrate. No myalgias.  Lab Results  Component Value Date   CHOL 149 02/06/2016   HDL 69.90 02/06/2016   LDLCALC 161 (H) 12/05/2015   LDLDIRECT 74.0 02/06/2016   TRIG 62.0 12/05/2015   CHOLHDL 3 12/05/2015   A/P: we restarted this last visit as she tends to respond VERY well to it as noted with LDL from 161 to 74. She prefers to avoid statins.

## 2016-02-11 NOTE — Progress Notes (Signed)
Pre visit review using our clinic review tool, if applicable. No additional management support is needed unless otherwise documented below in the visit note. 

## 2016-02-26 ENCOUNTER — Other Ambulatory Visit: Payer: Self-pay | Admitting: Family Medicine

## 2016-03-10 ENCOUNTER — Ambulatory Visit (INDEPENDENT_AMBULATORY_CARE_PROVIDER_SITE_OTHER): Payer: Medicare Other | Admitting: Family Medicine

## 2016-03-10 ENCOUNTER — Encounter: Payer: Self-pay | Admitting: Family Medicine

## 2016-03-10 DIAGNOSIS — I10 Essential (primary) hypertension: Secondary | ICD-10-CM | POA: Diagnosis not present

## 2016-03-10 NOTE — Assessment & Plan Note (Addendum)
S: controlled on metoprolol 25mg  BID with addition of 2.5mg  Last visit. .  BP Readings from Last 3 Encounters:  03/10/16 136/76  02/11/16 (!) 148/70  12/12/15 (!) 150/82  A/P:Continue current meds:  Doing well. aortic atherosclerosis- want to keep SBP <140/90. Discussed new guidelines <130/80 but would not be more aggressive with her age.

## 2016-03-10 NOTE — Progress Notes (Signed)
Subjective:  Lori [name] is a 75 y.o. year old very pleasant female patient who presents for/with See problem oriented charting ROS- no edema. No chest pain or shortness of breath. No headache or blurry vision.    Past Medical History-  Patient Active Problem List   Diagnosis Date Noted  . Hypertension 11/29/2013    Priority: Medium  . Hyperlipidemia 11/29/2013    Priority: Medium  . Barrett's esophagus 08/24/2013    Priority: Medium  . Osteoporosis 09/22/2006    Priority: Medium  . Aortic atherosclerosis ([place]) 02/11/2016    Priority: Low  . Eczema 11/29/2013    Priority: Low  . GERD (gastroesophageal reflux disease) 05/16/2013    Priority: Low  . Vitamin D deficiency 01/29/2012    Priority: Low  . Solitary pulmonary nodule 06/08/2011    Priority: Low  . HIATAL HERNIA WITH REFLUX 02/06/2010    Priority: Low  . DEGENERATIVE JOINT DISEASE, KNEE 03/14/2008    Priority: Low  . VARICOSE VEINS LOWER EXTREMITIES W/INFLAMMATION 03/09/2007    Priority: Low  . ALLERGIC RHINITIS 03/09/2007    Priority: Low  . ACTINIC KERATOSIS, FOREHEAD, LEFT 03/09/2007    Priority: Low  . Headache(784.0) 03/09/2007    Priority: Low  . MENIERE'S DISEASE 09/22/2006    Priority: Low  . RAYNAUD'S DISEASE 09/22/2006    Priority: Low    Medications- reviewed and updated Current Outpatient Prescriptions  Medication Sig Dispense Refill  . amLODipine (NORVASC) 2.5 MG tablet Take 1 tablet (2.5 mg total) by mouth daily. 30 tablet 3  . aspirin 81 MG tablet Take 81 mg by mouth daily.      . betamethasone dipropionate (DIPROLENE) 0.05 % cream APPLY TO SKIN AS DIRECTED 15 g 0  . fenofibrate 160 MG tablet Take 1 tablet (160 mg total) by mouth daily. 90 tablet 3  . fluticasone (FLONASE) 50 MCG/ACT nasal spray USE TWO SPRAY(S) IN EACH NOSTRIL AS NEEDED 16 g 8  . loratadine (CLARITIN) 10 MG tablet Take 10 mg by mouth daily. Take 1/2 tablet daily.    . metoprolol (LOPRESSOR) 50 MG tablet TAKE 1/2 TABLET  BY MOUTH 2 TIMES DAILY 90 tablet 1  . pantoprazole (PROTONIX) 40 MG tablet TAKE ONE TABLET BY MOUTH ONCE DAILY 30 tablet 5  . Vitamin D, Cholecalciferol, 1000 units TABS Take by mouth.     No current facility-administered medications for this visit.     Objective: BP 136/76 (BP Location: Left Arm, Patient Position: Sitting, Cuff Size: Normal)   Pulse 75   Temp 98.1 F (36.7 C) (Oral)   Ht 5' 4.5" (1.638 m)   Wt 126 lb 12.8 oz (57.5 kg)   SpO2 96%   BMI 21.43 kg/m  Gen: NAD, resting comfortably CV: RRR no murmurs rubs or gallops Lungs: CTAB no crackles, wheeze, rhonchi Abdomen: soft/nontender/nondistended/normal bowel sounds. thin Ext: no edema  Assessment/Plan:  Hypertension S: controlled on metoprolol 25mg  BID with addition of 2.5mg  Last visit. .  BP Readings from Last 3 Encounters:  03/10/16 136/76  02/11/16 (!) 148/70  12/12/15 (!) 150/82  A/P:Continue current meds:  Doing well. aortic atherosclerosis- want to keep SBP <140/90. Discussed new guidelines <130/80 but would not be more aggressive with her age.   Also has a stressful family situation. We spent a significant amount of time counseling about this topic. The duration of face-to-face time during this visit was greater than 15 minutes. Greater than 50% of this time was spent in counseling as noted above  Return precautions advised.  Garret Reddish, MD

## 2016-03-10 NOTE — Progress Notes (Signed)
Pre visit review using our clinic review tool, if applicable. No additional management support is needed unless otherwise documented below in the visit note. 

## 2016-03-10 NOTE — Patient Instructions (Signed)
Blood pressure looks great with new medicine.   No changes today

## 2016-04-14 ENCOUNTER — Ambulatory Visit (INDEPENDENT_AMBULATORY_CARE_PROVIDER_SITE_OTHER): Payer: Medicare Other | Admitting: Family Medicine

## 2016-04-14 ENCOUNTER — Encounter: Payer: Self-pay | Admitting: Family Medicine

## 2016-04-14 VITALS — BP 136/72 | HR 66 | Temp 98.4°F | Ht 64.5 in | Wt 126.6 lb

## 2016-04-14 DIAGNOSIS — J3489 Other specified disorders of nose and nasal sinuses: Secondary | ICD-10-CM | POA: Diagnosis not present

## 2016-04-14 DIAGNOSIS — J069 Acute upper respiratory infection, unspecified: Secondary | ICD-10-CM

## 2016-04-14 NOTE — Progress Notes (Signed)
Pre visit review using our clinic review tool, if applicable. No additional management support is needed unless otherwise documented below in the visit note. 

## 2016-04-14 NOTE — Progress Notes (Signed)
PCP: Garret Reddish, MD  Subjective:  Lori [name] is a 76 y.o. year old very pleasant female patient who presents with Upper Respiratory infection symptoms including itchy throat and ears for several days then progressed to nasal congestion, mild sore throat, cough productive yellow/green especially in AM. Sinus pressure in last 2 days.  -started: 1 week ago, symptoms waxing and waning -previous treatments:  muxinex or mucinex DM -sick contacts/travel/risks: denies flu exposure.  Granddaughter sick with similar -Hx of: allergies- startedback on otc antihistamine  ROS-denies fever, SOB, NVD, tooth pain  Pertinent Past Medical History-  Patient Active Problem List   Diagnosis Date Noted  . Hypertension 11/29/2013    Priority: Medium  . Hyperlipidemia 11/29/2013    Priority: Medium  . Barrett's esophagus 08/24/2013    Priority: Medium  . Osteoporosis 09/22/2006    Priority: Medium  . Aortic atherosclerosis ([place]) 02/11/2016    Priority: Low  . Eczema 11/29/2013    Priority: Low  . GERD (gastroesophageal reflux disease) 05/16/2013    Priority: Low  . Vitamin D deficiency 01/29/2012    Priority: Low  . Solitary pulmonary nodule 06/08/2011    Priority: Low  . HIATAL HERNIA WITH REFLUX 02/06/2010    Priority: Low  . DEGENERATIVE JOINT DISEASE, KNEE 03/14/2008    Priority: Low  . VARICOSE VEINS LOWER EXTREMITIES W/INFLAMMATION 03/09/2007    Priority: Low  . ALLERGIC RHINITIS 03/09/2007    Priority: Low  . ACTINIC KERATOSIS, FOREHEAD, LEFT 03/09/2007    Priority: Low  . Headache(784.0) 03/09/2007    Priority: Low  . MENIERE'S DISEASE 09/22/2006    Priority: Low  . RAYNAUD'S DISEASE 09/22/2006    Priority: Low     Medications- reviewed  Current Outpatient Prescriptions  Medication Sig Dispense Refill  . amLODipine (NORVASC) 2.5 MG tablet Take 1 tablet (2.5 mg total) by mouth daily. 30 tablet 3  . aspirin 81 MG tablet Take 81 mg by mouth daily.      . betamethasone  dipropionate (DIPROLENE) 0.05 % cream APPLY TO SKIN AS DIRECTED 15 g 0  . fenofibrate 160 MG tablet Take 1 tablet (160 mg total) by mouth daily. 90 tablet 3  . fluticasone (FLONASE) 50 MCG/ACT nasal spray USE TWO SPRAY(S) IN EACH NOSTRIL AS NEEDED 16 g 8  . loratadine (CLARITIN) 10 MG tablet Take 10 mg by mouth daily. Take 1/2 tablet daily.    . metoprolol (LOPRESSOR) 50 MG tablet TAKE 1/2 TABLET BY MOUTH 2 TIMES DAILY 90 tablet 1  . pantoprazole (PROTONIX) 40 MG tablet TAKE ONE TABLET BY MOUTH ONCE DAILY 30 tablet 5  . Vitamin D, Cholecalciferol, 1000 units TABS Take by mouth.     No current facility-administered medications for this visit.     Objective: BP 136/72 (BP Location: Left Arm, Patient Position: Sitting, Cuff Size: Normal)   Pulse 66   Temp 98.4 F (36.9 C) (Oral)   Ht 5' 4.5" (1.638 m)   Wt 126 lb 9.6 oz (57.4 kg)   SpO2 97%   BMI 21.40 kg/m  Gen: NAD, resting comfortably HEENT: Turbinates erythematous with yellow discharge, TM normal, pharynx mildly erythematous with no tonsilar exudate or edema, minimal sinus tenderness CV: RRR no murmurs rubs or gallops Lungs: CTAB no crackles, wheeze, rhonchi Abdomen: soft/nontender/nondistended/normal bowel sounds. No rebound or guarding.  Ext: no edema Skin: warm, dry, no rash Neuro: grossly normal, moves all extremities  Assessment/Plan:  Upper Respiratory infection History and exam today are suggestive of viral infection most  likely due to upper respiratory infection. Symptomatic treatment with: mucinex  We discussed that we did not find any infection that had higher probability of being bacterial such as pneumonia or strep throat. We discussed signs that bacterial infection may have developed particularly fever or shortness of breath. Likely course of 2 weeks. Patient is contagious and advised good handwashing and consideration of mask If going to be in public places.   Sinus pressure for last 2 days- discussed if persists a  week would call in doxycycline with her azithromycin and pcn allergy  Finally, we reviewed reasons to return to care including if symptoms worsen or persist or new concerns arise- once again particularly shortness of breath or fever.  Garret Reddish, MD

## 2016-04-14 NOTE — Patient Instructions (Signed)
History and exam today are suggestive of viral infection most likely due to upper respiratory infection. Symptomatic treatment with: mucinex  We discussed that we did not find any infection that had higher probability of being bacterial such as pneumonia or strep throat. We discussed signs that bacterial infection may have developed particularly fever or shortness of breath or continued sinus pain past 10 days. Likely course of 2 weeks. Patient is contagious and advised good handwashing and consideration of mask If going to be in public places.   Finally, we reviewed reasons to return to care including if symptoms worsen or persist or new concerns arise- once again particularly shortness of breath or fever.

## 2016-04-23 ENCOUNTER — Ambulatory Visit (INDEPENDENT_AMBULATORY_CARE_PROVIDER_SITE_OTHER): Payer: Medicare Other | Admitting: Family Medicine

## 2016-04-23 ENCOUNTER — Encounter: Payer: Self-pay | Admitting: Family Medicine

## 2016-04-23 VITALS — BP 134/70 | HR 91 | Temp 98.8°F | Ht 64.5 in | Wt 126.6 lb

## 2016-04-23 DIAGNOSIS — B9689 Other specified bacterial agents as the cause of diseases classified elsewhere: Secondary | ICD-10-CM | POA: Diagnosis not present

## 2016-04-23 DIAGNOSIS — J329 Chronic sinusitis, unspecified: Secondary | ICD-10-CM

## 2016-04-23 MED ORDER — DOXYCYCLINE HYCLATE 100 MG PO TABS: 100.0000 mg | ORAL_TABLET | Freq: Two times a day (BID) | ORAL | 0 refills | Status: DC

## 2016-04-23 MED ORDER — PREDNISONE 20 MG PO TABS: ORAL_TABLET | ORAL | 0 refills | Status: DC

## 2016-04-23 MED ORDER — ONDANSETRON 4 MG PO TBDP: 4.0000 mg | ORAL_TABLET | Freq: Three times a day (TID) | ORAL | 0 refills | Status: DC | PRN

## 2016-04-23 NOTE — Progress Notes (Signed)
Pre visit review using our clinic review tool, if applicable. No additional management support is needed unless otherwise documented below in the visit note. 

## 2016-04-23 NOTE — Progress Notes (Signed)
PCP: Garret Reddish, MD  Subjective:  Lori [name] is a 76 y.o. year old very pleasant female patient who presents with sinusitis symptoms including nasal congestion, sinus tenderness, cough. Seen several days ago for URI and at that day had 2 days of some sinus pressure- we discussed potential for bacterial sinus infection.  -other symptoms include: some chills, body aches for last several days but no fever.  -day of illness: over 10 days now for sinus pressure -Symptoms are not improving -previous treatments: some nausea when sinuses drain -sick contacts/travel/risks: denies flu exposure.  -Hx of: allergies  ROS-denies fever, SOB, NVD, tooth pain  Pertinent Past Medical History-  Patient Active Problem List   Diagnosis Date Noted  . Hypertension 11/29/2013    Priority: Medium  . Hyperlipidemia 11/29/2013    Priority: Medium  . Barrett's esophagus 08/24/2013    Priority: Medium  . Osteoporosis 09/22/2006    Priority: Medium  . Aortic atherosclerosis ([place]) 02/11/2016    Priority: Low  . Eczema 11/29/2013    Priority: Low  . GERD (gastroesophageal reflux disease) 05/16/2013    Priority: Low  . Vitamin D deficiency 01/29/2012    Priority: Low  . Solitary pulmonary nodule 06/08/2011    Priority: Low  . HIATAL HERNIA WITH REFLUX 02/06/2010    Priority: Low  . DEGENERATIVE JOINT DISEASE, KNEE 03/14/2008    Priority: Low  . VARICOSE VEINS LOWER EXTREMITIES W/INFLAMMATION 03/09/2007    Priority: Low  . ALLERGIC RHINITIS 03/09/2007    Priority: Low  . ACTINIC KERATOSIS, FOREHEAD, LEFT 03/09/2007    Priority: Low  . Headache(784.0) 03/09/2007    Priority: Low  . MENIERE'S DISEASE 09/22/2006    Priority: Low  . RAYNAUD'S DISEASE 09/22/2006    Priority: Low    Medications- reviewed  Current Outpatient Prescriptions  Medication Sig Dispense Refill  . amLODipine (NORVASC) 2.5 MG tablet Take 1 tablet (2.5 mg total) by mouth daily. 30 tablet 3  . aspirin 81 MG tablet  Take 81 mg by mouth daily.      . betamethasone dipropionate (DIPROLENE) 0.05 % cream APPLY TO SKIN AS DIRECTED 15 g 0  . fenofibrate 160 MG tablet Take 1 tablet (160 mg total) by mouth daily. 90 tablet 3  . fluticasone (FLONASE) 50 MCG/ACT nasal spray USE TWO SPRAY(S) IN EACH NOSTRIL AS NEEDED 16 g 8  . loratadine (CLARITIN) 10 MG tablet Take 10 mg by mouth daily. Take 1/2 tablet daily.    . metoprolol (LOPRESSOR) 50 MG tablet TAKE 1/2 TABLET BY MOUTH 2 TIMES DAILY 90 tablet 1  . pantoprazole (PROTONIX) 40 MG tablet TAKE ONE TABLET BY MOUTH ONCE DAILY 30 tablet 5  . Vitamin D, Cholecalciferol, 1000 units TABS Take by mouth.    . doxycycline (VIBRA-TABS) 100 MG tablet Take 1 tablet (100 mg total) by mouth 2 (two) times daily. 14 tablet 0  . ondansetron (ZOFRAN-ODT) 4 MG disintegrating tablet Take 1 tablet (4 mg total) by mouth every 8 (eight) hours as needed for nausea or vomiting. 20 tablet 0  . predniSONE (DELTASONE) 20 MG tablet Take 1 tablet by mouth daily for 5 days, then 1/2 tablet daily for 2 days 6 tablet 0   No current facility-administered medications for this visit.     Objective: BP 134/70 (BP Location: Left Arm, Patient Position: Sitting, Cuff Size: Large)   Pulse 91   Temp 98.8 F (37.1 C) (Oral)   Ht 5' 4.5" (1.638 m)   Wt 126 lb 9.6  oz (57.4 kg)   SpO2 95%   BMI 21.40 kg/m  Gen: NAD, resting comfortably HEENT: Turbinates erythematous with yellow drainage, TM normal, pharynx mildly erythematous with no tonsilar exudate or edema, maxillary sinus tenderness CV: RRR no murmurs rubs or gallops Lungs: CTAB no crackles, wheeze, rhonchi Abdomen: soft/nontender/nondistended/normal bowel sounds. No rebound or guarding.  Ext: no edema Skin: warm, dry, no rash Neuro: grossly normal, moves all extremities  Assessment/Plan:  Sinsusitis Bacterial based on: Symptoms >10 days, double sickening, or severe symptoms in first 3 days  Treatment: -considered steroid: we opted  in -other symptomatic care with mucinex, zofran for nausea -Antibiotic indicated: as below  Finally, we reviewed reasons to return to care including if symptoms worsen or persist or new concerns arise (particularly fever or shortness of breath)  Meds ordered this encounter  Medications  . ondansetron (ZOFRAN-ODT) 4 MG disintegrating tablet    Sig: Take 1 tablet (4 mg total) by mouth every 8 (eight) hours as needed for nausea or vomiting.    Dispense:  20 tablet    Refill:  0  . doxycycline (VIBRA-TABS) 100 MG tablet    Sig: Take 1 tablet (100 mg total) by mouth 2 (two) times daily.    Dispense:  14 tablet    Refill:  0  . predniSONE (DELTASONE) 20 MG tablet    Sig: Take 1 tablet by mouth daily for 5 days, then 1/2 tablet daily for 2 days    Dispense:  6 tablet    Refill:  0    Garret Reddish, MD

## 2016-04-23 NOTE — Patient Instructions (Addendum)
Doxycycline antibiotic for bacterial infection Prednisone for 7 days for inflammation Zofran/ondansetron for nausea  Follow up if not improving by next week   Sinusitis, Adult Sinusitis is soreness and inflammation of your sinuses. Sinuses are hollow spaces in the bones around your face. They are located:  Around your eyes.  In the middle of your forehead.  Behind your nose.  In your cheekbones. Your sinuses and nasal passages are lined with a stringy fluid (mucus). Mucus normally drains out of your sinuses. When your nasal tissues get inflamed or swollen, the mucus can get trapped or blocked so air cannot flow through your sinuses. This lets bacteria, viruses, and funguses grow, and that leads to infection. Follow these instructions at home: Medicines  Take, use, or apply over-the-counter and prescription medicines only as told by your doctor. These may include nasal sprays.  If you were prescribed an antibiotic medicine, take it as told by your doctor. Do not stop taking the antibiotic even if you start to feel better. Hydrate and Humidify  Drink enough water to keep your pee (urine) clear or pale yellow.  Use a cool mist humidifier to keep the humidity level in your home above 50%.  Breathe in steam for 10-15 minutes, 3-4 times a day or as told by your doctor. You can do this in the bathroom while a hot shower is running.  Try not to spend time in cool or dry air. Rest  Rest as much as possible.  Sleep with your head raised (elevated).  Make sure to get enough sleep each night. General instructions  Put a warm, moist washcloth on your face 3-4 times a day or as told by your doctor. This will help with discomfort.  Wash your hands often with soap and water. If there is no soap and water, use hand sanitizer.  Do not smoke. Avoid being around people who are smoking (secondhand smoke).  Keep all follow-up visits as told by your doctor. This is important. Contact a  doctor if:  You have a fever.  Your symptoms get worse.  Your symptoms do not get better within 10 days. Get help right away if:  You have a very bad headache.  You cannot stop throwing up (vomiting).  You have pain or swelling around your face or eyes.  You have trouble seeing.  You feel confused.  Your neck is stiff.  You have trouble breathing. This information is not intended to replace advice given to you by your health care provider. Make sure you discuss any questions you have with your health care provider. Document Released: 08/25/2007 Document Revised: 11/02/2015 Document Reviewed: 01/01/2015 Elsevier Interactive Patient Education  2017 [employer].

## 2016-05-04 ENCOUNTER — Other Ambulatory Visit: Payer: Self-pay | Admitting: Family Medicine

## 2016-05-19 ENCOUNTER — Other Ambulatory Visit: Payer: Self-pay | Admitting: Family Medicine

## 2016-06-09 DIAGNOSIS — Z1231 Encounter for screening mammogram for malignant neoplasm of breast: Secondary | ICD-10-CM | POA: Diagnosis not present

## 2016-06-09 LAB — HM MAMMOGRAPHY

## 2016-06-11 ENCOUNTER — Encounter: Payer: Self-pay | Admitting: Family Medicine

## 2016-07-06 ENCOUNTER — Telehealth: Payer: Self-pay | Admitting: Gastroenterology

## 2016-07-06 NOTE — Telephone Encounter (Signed)
Received records from [facility]. Patient states that the only reason why she saw a [facility] dr was because she was diagnosed with barretts esophagus and was "scared and hurting". She says that she wanted to participate in a clinical trial. Patient states that she didn't realize that she was "transferring care" to [facility]. She is requesting to see Dr. Fuller Plan again.

## 2016-07-08 ENCOUNTER — Encounter: Payer: Self-pay | Admitting: Gastroenterology

## 2016-07-08 NOTE — Telephone Encounter (Signed)
Dr. Fuller Plan reviewed records and has accepted patient. Ok to schedule Direct EGD. EGD scheduled.

## 2016-07-16 ENCOUNTER — Encounter: Payer: Self-pay | Admitting: Family Medicine

## 2016-07-16 ENCOUNTER — Ambulatory Visit (INDEPENDENT_AMBULATORY_CARE_PROVIDER_SITE_OTHER): Payer: Medicare Other | Admitting: Family Medicine

## 2016-07-16 VITALS — BP 138/82 | HR 97 | Temp 98.2°F | Ht 64.5 in | Wt 127.0 lb

## 2016-07-16 DIAGNOSIS — J301 Allergic rhinitis due to pollen: Secondary | ICD-10-CM

## 2016-07-16 DIAGNOSIS — R002 Palpitations: Secondary | ICD-10-CM

## 2016-07-16 NOTE — Progress Notes (Signed)
Pre visit review using our clinic review tool, if applicable. No additional management support is needed unless otherwise documented below in the visit note. 

## 2016-07-16 NOTE — Progress Notes (Signed)
Subjective:  Lori [name] is a 76 y.o. year old very pleasant female patient who presents for/with See problem oriented charting ROS- no chest pain. No fever. No shortness of breath. No abdominal pain.    Past Medical History-  Patient Active Problem List   Diagnosis Date Noted  . Hypertension 11/29/2013    Priority: Medium  . Hyperlipidemia 11/29/2013    Priority: Medium  . Barrett's esophagus 08/24/2013    Priority: Medium  . Osteoporosis 09/22/2006    Priority: Medium  . Aortic atherosclerosis ([place]) 02/11/2016    Priority: Low  . Eczema 11/29/2013    Priority: Low  . GERD (gastroesophageal reflux disease) 05/16/2013    Priority: Low  . Vitamin D deficiency 01/29/2012    Priority: Low  . Solitary pulmonary nodule 06/08/2011    Priority: Low  . HIATAL HERNIA WITH REFLUX 02/06/2010    Priority: Low  . DEGENERATIVE JOINT DISEASE, KNEE 03/14/2008    Priority: Low  . VARICOSE VEINS LOWER EXTREMITIES W/INFLAMMATION 03/09/2007    Priority: Low  . ALLERGIC RHINITIS 03/09/2007    Priority: Low  . ACTINIC KERATOSIS, FOREHEAD, LEFT 03/09/2007    Priority: Low  . Headache(784.0) 03/09/2007    Priority: Low  . MENIERE'S DISEASE 09/22/2006    Priority: Low  . RAYNAUD'S DISEASE 09/22/2006    Priority: Low    Medications- reviewed and updated Current Outpatient Prescriptions  Medication Sig Dispense Refill  . amLODipine (NORVASC) 2.5 MG tablet TAKE 1 TABLET BY MOUTH EVERY DAY 30 tablet 5  . aspirin 81 MG tablet Take 81 mg by mouth daily.      . betamethasone dipropionate (DIPROLENE) 0.05 % cream APPLY TO SKIN AS DIRECTED 15 g 0  . fenofibrate 160 MG tablet Take 1 tablet (160 mg total) by mouth daily. 90 tablet 3  . fluticasone (FLONASE) 50 MCG/ACT nasal spray USE TWO SPRAY(S) IN EACH NOSTRIL AS NEEDED 16 g 8  . loratadine (CLARITIN) 10 MG tablet Take 10 mg by mouth daily. Take 1/2 tablet daily.    . metoprolol (LOPRESSOR) 50 MG tablet TAKE 1/2 TABLET BY MOUTH 2 TIMES DAILY  90 tablet 1  . ondansetron (ZOFRAN-ODT) 4 MG disintegrating tablet Take 1 tablet (4 mg total) by mouth every 8 (eight) hours as needed for nausea or vomiting. 20 tablet 0  . pantoprazole (PROTONIX) 40 MG tablet TAKE 1 TABLET BY MOUTH EVERY DAY 30 tablet 2  . Vitamin D, Cholecalciferol, 1000 units TABS Take by mouth.     Objective: BP 138/82 (BP Location: Left Arm, Patient Position: Sitting, Cuff Size: Normal)   Pulse 97   Temp 98.2 F (36.8 C) (Oral)   Ht 5' 4.5" (1.638 m)   Wt 127 lb (57.6 kg)   SpO2 94%   BMI 21.46 kg/m  Gen: NAD, resting comfortably TM  Bilaterally with air fluid level, nares - noted some friable areas. Clear discharge only. Pharynx with cobblestoning.  CV: RRR no murmurs rubs or gallops Lungs: CTAB no crackles, wheeze, rhonchi Ext: no edema Skin: warm, dry  Assessment/Plan:  No diagnosis found. S: Patient for about 2 weeks has not felt well. She wondered about allergies vs. URI. Symptoms include: Mild wheeze in chest, scratchy throat, fatigued, runny nose, itchy eyes.  Bloody nose so cant use flonase. claritin 1-2 days ago started and seems to help some.   She has also had a few other symptoms. This AM, Felt really weak and had to sit down- had some coffee with honey and  felt better. No chest pain or shortness of breath at the times. Has also had a few occasions of palpitations though not at present and didn't feel when she had the weakness. She has had some palpitations in the past she reports but the fact these happened with rest concerns her. She admits to not staying well hydrated.   She also thinks she has just felt more fatigued recently and has had some episodes of feeling lightheaded at home (palpitations did not line up with lightheadedness). Blood pressure checked by insurance RN was around 127.   A/P: 76 year old female who appears to be dealing with some seasonal allergies as well as relative dehydration. We discussed symptomatic care regimen and  follow up in 2 weeks. Has also felt sleepier, more fatigued and has had a few episodes of palpitations. We are going to try conservative care and follow up in 10 days. EKG likely low yield today as no palpitations today and regular HR- would likely more consider an event monitor if persist.  Discussed flonase- but gets bloody noses so wants to avoid. She does appear to have effusions in the ear without significant hearing loss- would prefer use of flonase or decongestant but cannot do because of nosebleeds and hypertension.  Following plan from AVS: "50 oz of fluid a day  Decrease milk to 1-2 glasses a day  Take claritin daily  Monitor blood pressure daily and bring log of blood pressures Write down on your log- when you have palpitations  Reevaluate 10 days - if no better start with more extensive workup like EKG particularly if having palpitations at time of visit. Would also get bloodwork."  Return precautions advised.  Garret Reddish, MD

## 2016-07-16 NOTE — Patient Instructions (Signed)
50 oz of fluid a day  Decrease milk to 1-2 glasses a day  Take claritin daily  Monitor blood pressure daily and bring log of blood pressures Write down on your log- when you have palpitations  Reevaluate 10 days - if no better start with more extensive workup like EKG particularly if having palpitations at time of visit. Would also get bloodwork.

## 2016-07-27 ENCOUNTER — Ambulatory Visit (INDEPENDENT_AMBULATORY_CARE_PROVIDER_SITE_OTHER): Payer: Medicare Other | Admitting: Family Medicine

## 2016-07-27 ENCOUNTER — Encounter: Payer: Self-pay | Admitting: Family Medicine

## 2016-07-27 VITALS — BP 132/72 | HR 73 | Temp 97.8°F | Ht 64.5 in | Wt 127.2 lb

## 2016-07-27 DIAGNOSIS — R002 Palpitations: Secondary | ICD-10-CM

## 2016-07-27 DIAGNOSIS — J301 Allergic rhinitis due to pollen: Secondary | ICD-10-CM | POA: Diagnosis not present

## 2016-07-27 DIAGNOSIS — M816 Localized osteoporosis [Lequesne]: Secondary | ICD-10-CM

## 2016-07-27 NOTE — Progress Notes (Signed)
Subjective:  Zerline Melchior is a 76 y.o. year old very pleasant female patient who presents for/with See problem oriented charting ROS- no recent fractures. No palpitations or shortness of breath. No edema.    Past Medical History-  Patient Active Problem List   Diagnosis Date Noted  . Hypertension 11/29/2013    Priority: Medium  . Hyperlipidemia 11/29/2013    Priority: Medium  . Barrett's esophagus 08/24/2013    Priority: Medium  . Osteoporosis 09/22/2006    Priority: Medium  . Aortic atherosclerosis ([place]) 02/11/2016    Priority: Low  . Eczema 11/29/2013    Priority: Low  . GERD (gastroesophageal reflux disease) 05/16/2013    Priority: Low  . Vitamin D deficiency 01/29/2012    Priority: Low  . Solitary pulmonary nodule 06/08/2011    Priority: Low  . HIATAL HERNIA WITH REFLUX 02/06/2010    Priority: Low  . DEGENERATIVE JOINT DISEASE, KNEE 03/14/2008    Priority: Low  . VARICOSE VEINS LOWER EXTREMITIES W/INFLAMMATION 03/09/2007    Priority: Low  . ALLERGIC RHINITIS 03/09/2007    Priority: Low  . ACTINIC KERATOSIS, FOREHEAD, LEFT 03/09/2007    Priority: Low  . Headache(784.0) 03/09/2007    Priority: Low  . MENIERE'S DISEASE 09/22/2006    Priority: Low  . RAYNAUD'S DISEASE 09/22/2006    Priority: Low    Medications- reviewed and updated Current Outpatient Prescriptions  Medication Sig Dispense Refill  . amLODipine (NORVASC) 2.5 MG tablet TAKE 1 TABLET BY MOUTH EVERY DAY 30 tablet 5  . aspirin 81 MG tablet Take 81 mg by mouth daily.      . betamethasone dipropionate (DIPROLENE) 0.05 % cream APPLY TO SKIN AS DIRECTED 15 g 0  . fenofibrate 160 MG tablet Take 1 tablet (160 mg total) by mouth daily. 90 tablet 3  . fluticasone (FLONASE) 50 MCG/ACT nasal spray USE TWO SPRAY(S) IN EACH NOSTRIL AS NEEDED 16 g 8  . loratadine (CLARITIN) 10 MG tablet Take 10 mg by mouth daily. Take 1/2 tablet daily.    . metoprolol (LOPRESSOR) 50 MG tablet TAKE 1/2 TABLET BY MOUTH 2 TIMES  DAILY 90 tablet 1  . pantoprazole (PROTONIX) 40 MG tablet TAKE 1 TABLET BY MOUTH EVERY DAY 30 tablet 2  . Vitamin D, Cholecalciferol, 1000 units TABS Take by mouth.     No current facility-administered medications for this visit.     Objective: BP 132/72   Pulse 73   Temp 97.8 F (36.6 C) (Oral)   Ht 5' 4.5" (1.638 m)   Wt 127 lb 3.2 oz (57.7 kg)   SpO2 97%   BMI 21.50 kg/m  Gen: NAD, resting comfortably No rhinorrheanoted CV: RRR no murmurs rubs or gallops Lungs: CTAB no crackles, wheeze, rhonchi Ext: no edema Skin: warm, dry  Assessment/Plan:  Palpitations Seasonal allergic rhinitis due to pollen Relative dehydration S: Seen about 10 days ago- thought ot be dealing with seasonal allergies, relative dehydration. We had her continue claritin, push fluid to 50 oz a day but decrease milk to 1-2 glasses a day down from 5 glasses a day. Also had been having palpitations.   She has had complete resolution of the above symptoms fortunately with above changes. States feels much better. Had also felt fatigued which has resolved A/P: I encouraged her if she has recurrent palpitations (or other new or worsening symptoms) despite above changes or other new or worsening symptoms to see [place] immediately- she agrees. She is tahnkful to be feeling so much better-  could continue antihistamine as well as increased hydration.   Osteoporosis S: in 12/2013 patient with bone density showing osteoporosis with worst t score of -2.6. She declined meds other than calcium or vitamin D. She agreed to weight bearing exercise.  A/P: we will update DEXA for comparison from 2015- hopeful no significant change  has follow up visit within 2 months already scheduled Orders Placed This Encounter  Procedures  . DG Bone Density    Standing Status:   Future    Standing Expiration Date:   09/26/2017    Order Specific Question:   Reason for Exam (SYMPTOM  OR DIAGNOSIS REQUIRED)    Answer:   follow up osteoporosis     Order Specific Question:   Preferred imaging location?    Answer:   [facility]   Return precautions advised.  Garret Reddish, MD

## 2016-07-27 NOTE — Patient Instructions (Addendum)
Glad you are doing better- keep the milk to 1-2 glasses a day, keep drinking fluids at least 50 oz a day. Continue claritin through this bad allergy season  Schedule your bone density test at check out desk.   See you soon

## 2016-07-27 NOTE — Assessment & Plan Note (Signed)
S: in 12/2013 patient with bone density showing osteoporosis with worst t score of -2.6. She declined meds other than calcium or vitamin D. She agreed to weight bearing exercise.  A/P: we will update DEXA for comparison from 2015- hopeful no significant change

## 2016-08-06 ENCOUNTER — Ambulatory Visit (INDEPENDENT_AMBULATORY_CARE_PROVIDER_SITE_OTHER)
Admission: RE | Admit: 2016-08-06 | Discharge: 2016-08-06 | Disposition: A | Discharge status: Home / Self Care | Payer: Medicare Other | Source: Ambulatory Visit | Attending: Family Medicine | Admitting: Family Medicine

## 2016-08-06 DIAGNOSIS — M816 Localized osteoporosis [Lequesne]: Secondary | ICD-10-CM | POA: Diagnosis not present

## 2016-08-09 ENCOUNTER — Ambulatory Visit (INDEPENDENT_AMBULATORY_CARE_PROVIDER_SITE_OTHER): Payer: Medicare Other | Admitting: Family Medicine

## 2016-08-09 ENCOUNTER — Encounter: Payer: Self-pay | Admitting: Family Medicine

## 2016-08-09 VITALS — BP 132/72 | HR 77 | Temp 98.5°F | Ht 64.5 in | Wt 125.2 lb

## 2016-08-09 DIAGNOSIS — J0101 Acute recurrent maxillary sinusitis: Secondary | ICD-10-CM | POA: Diagnosis not present

## 2016-08-09 DIAGNOSIS — J301 Allergic rhinitis due to pollen: Secondary | ICD-10-CM | POA: Diagnosis not present

## 2016-08-09 MED ORDER — PREDNISONE 20 MG PO TABS: ORAL_TABLET | ORAL | 0 refills | Status: DC

## 2016-08-09 NOTE — Progress Notes (Signed)
PCP: [name], MD  Subjective:  Lori [name] is a 76 y.o. year old very pleasant female patient who presents with sinusitis symptoms including nasal congestion, sinus tenderness. She has had allergic symptoms for several months now. Started claritin full tablet about a month ago and seemed to help but had some lingering sneezing and sinus congestion. Then over the weekend symptoms got significantly worse. Feeling very congested in nose-Didn't sleep. Pressure in face. Clear to yellow discharge from nose. Some pressure in both ears. She had a nosebleed for 2 hours the other day as well (cannot tolerate flonase and has not been using- nosebleeds worse when on prednisone. Claritin seems to dry her out.   -day of illness:> 1 month, at least 3 days of worsenign symptoms -Symptoms are worsening -previous treatments: claritin as noted -sick contacts/travel/risks: denies flu exposure.  -Hx of: allergies- yes, suspect baseline poor control  ROS-denies fever, SOB, NVD, tooth pain. No chest pain  Pertinent Past Medical History-  Patient Active Problem List   Diagnosis Date Noted  . Hypertension 11/29/2013    Priority: Medium  . Hyperlipidemia 11/29/2013    Priority: Medium  . Barrett's esophagus 08/24/2013    Priority: Medium  . Osteoporosis 09/22/2006    Priority: Medium  . Aortic atherosclerosis ([place]) 02/11/2016    Priority: Low  . Eczema 11/29/2013    Priority: Low  . GERD (gastroesophageal reflux disease) 05/16/2013    Priority: Low  . Vitamin D deficiency 01/29/2012    Priority: Low  . Solitary pulmonary nodule 06/08/2011    Priority: Low  . HIATAL HERNIA WITH REFLUX 02/06/2010    Priority: Low  . DEGENERATIVE JOINT DISEASE, KNEE 03/14/2008    Priority: Low  . VARICOSE VEINS LOWER EXTREMITIES W/INFLAMMATION 03/09/2007    Priority: Low  . ALLERGIC RHINITIS 03/09/2007    Priority: Low  . ACTINIC KERATOSIS, FOREHEAD, LEFT 03/09/2007    Priority: Low  . Headache(784.0)  03/09/2007    Priority: Low  . MENIERE'S DISEASE 09/22/2006    Priority: Low  . RAYNAUD'S DISEASE 09/22/2006    Priority: Low    Medications- reviewed  Current Outpatient Prescriptions  Medication Sig Dispense Refill  . amLODipine (NORVASC) 2.5 MG tablet TAKE 1 TABLET BY MOUTH EVERY DAY 30 tablet 5  . aspirin 81 MG tablet Take 81 mg by mouth daily.      . betamethasone dipropionate (DIPROLENE) 0.05 % cream APPLY TO SKIN AS DIRECTED 15 g 0  . fenofibrate 160 MG tablet Take 1 tablet (160 mg total) by mouth daily. 90 tablet 3  . fluticasone (FLONASE) 50 MCG/ACT nasal spray USE TWO SPRAY(S) IN EACH NOSTRIL AS NEEDED 16 g 8  . loratadine (CLARITIN) 10 MG tablet Take 10 mg by mouth daily. Take 1/2 tablet daily.    . metoprolol (LOPRESSOR) 50 MG tablet TAKE 1/2 TABLET BY MOUTH 2 TIMES DAILY 90 tablet 1  . pantoprazole (PROTONIX) 40 MG tablet TAKE 1 TABLET BY MOUTH EVERY DAY 30 tablet 2  . Vitamin D, Cholecalciferol, 1000 units TABS Take by mouth.    . predniSONE (DELTASONE) 20 MG tablet Take 1 tablet by mouth daily for 5 days, then 1/2 tablet daily for 2 days 6 tablet 0   No current facility-administered medications for this visit.     Objective: BP 132/72   Pulse 77   Temp 98.5 F (36.9 C) (Oral)   Ht 5' 4.5" (1.638 m)   Wt 125 lb 3.2 oz (56.8 kg)   SpO2  94%   BMI 21.16 kg/m  Gen: NAD, resting comfortably HEENT: Turbinates erythematous with yellow drainage- some dried blood at bottom of right nare, TM normal, pharynx mildly erythematous with no tonsilar exudate or edema, minimal maxillary sinus tenderness CV: RRR no murmurs rubs or gallops Lungs: CTAB no crackles, wheeze, rhonchi Ext: no edema Skin: warm, dry, no rash  Assessment/Plan:  Sinsusitis and allergic rhinitis Suspect Bacterial based on: Symptoms >10 days anddouble sickening. Baseline poor allergic control likely contributing- consider singulair  Treatment: -considered steroid: we opted in to see if we could avoid  antibiotic. Possible primarily allergic or viral symptoms.  -Antibiotic indicated: suspect we may have to use doxycycline- we will use this if she is not at least 50% better by Thursday. She will reach out by phone or mychart  Finally, we reviewed reasons to return to care including if symptoms worsen or persist or new concerns arise (particularly fever or shortness of breath)  Meds ordered this encounter  Medications  . predniSONE (DELTASONE) 20 MG tablet    Sig: Take 1 tablet by mouth daily for 5 days, then 1/2 tablet daily for 2 days    Dispense:  6 tablet    Refill:  0    Garret Reddish, MD

## 2016-08-09 NOTE — Patient Instructions (Addendum)
Poor control of allergies at baseline. I am considering adding singulair if your issues persist over next 1-2 months.   Seem to have a sinus infection today. Question is it viral or bacterial. Trial prednisone for 7 days. If not at least 50% better by Thursday would send in antibiotic as well- likely doxycycline

## 2016-08-17 ENCOUNTER — Ambulatory Visit (INDEPENDENT_AMBULATORY_CARE_PROVIDER_SITE_OTHER)
Admission: RE | Admit: 2016-08-17 | Discharge: 2016-08-17 | Disposition: A | Discharge status: Home / Self Care | Payer: Medicare Other | Source: Ambulatory Visit | Attending: Family Medicine | Admitting: Family Medicine

## 2016-08-17 ENCOUNTER — Ambulatory Visit (INDEPENDENT_AMBULATORY_CARE_PROVIDER_SITE_OTHER): Payer: Medicare Other | Admitting: Family Medicine

## 2016-08-17 ENCOUNTER — Encounter: Payer: Self-pay | Admitting: Family Medicine

## 2016-08-17 VITALS — BP 146/82 | HR 91 | Temp 98.6°F | Ht 64.5 in | Wt 124.6 lb

## 2016-08-17 DIAGNOSIS — J209 Acute bronchitis, unspecified: Secondary | ICD-10-CM

## 2016-08-17 DIAGNOSIS — R05 Cough: Secondary | ICD-10-CM | POA: Diagnosis not present

## 2016-08-17 MED ORDER — ALBUTEROL SULFATE HFA 108 (90 BASE) MCG/ACT IN AERS: 2.0000 | INHALATION_SPRAY | Freq: Four times a day (QID) | RESPIRATORY_TRACT | 0 refills | Status: DC | PRN

## 2016-08-17 MED ORDER — MONTELUKAST SODIUM 10 MG PO TABS: 10.0000 mg | ORAL_TABLET | Freq: Every day | ORAL | 3 refills | Status: DC

## 2016-08-17 NOTE — Patient Instructions (Signed)
Bronchitis History and exam today are suggestive of viral infection most likely due to bronchitis. Allergies also appear to still be bothering you  Symptomatic treatment with:  1. Albuterol for wheeze 2. singulair for allergies in addition to claritin 3. Hold off on prednisone repeat since recently had this  We will get a chest x-ray to make sure no pneumonia. Will definitely need antibiotics if pneumonia or also willing to send this in if not improving in next 2-3 days (can mychart [place], call us , or come see [place])  We discussed that we did not find any infection that had higher probability of being bacterial such as pneumonia, strep throat, ear infection, bacterial sinusitis (especially with sinus symptoms improving). We discussed signs that bacterial infection may have developed particularly fever or shortness of breath and reasons for follow up (symptoms worsen, last past expected time frame, new concerns arise).  Likely course of 3-6 weeks.

## 2016-08-17 NOTE — Progress Notes (Signed)
PCP: [name], MD  Subjective:  Lori [name] is a 76 y.o. year old very pleasant female patient who presents with  symptoms including nasal congestion but now with clear drainage only,  cough, chest congestion, wheeze.  - She states her sinus pressure and pain resolved with prior prednisone. She had some lingering cough and mild congestion- had continued her claritin. Then in last day started to feel like she had chest congestion and wheeze -started: > 10 days ago but acute symptoms of chest congestion and wheeze only within a day.  - symptoms are worsening -previous treatments: none for acute issues, prednisone prior.  -denies new sick contact.   ROS-denies fever, NVD, tooth pain. Denies significant shortness of breath but feels mildly winded.   Pertinent Past Medical History-  Patient Active Problem List   Diagnosis Date Noted  . Hypertension 11/29/2013    Priority: Medium  . Hyperlipidemia 11/29/2013    Priority: Medium  . Barrett's esophagus 08/24/2013    Priority: Medium  . Osteoporosis 09/22/2006    Priority: Medium  . Aortic atherosclerosis ([place]) 02/11/2016    Priority: Low  . Eczema 11/29/2013    Priority: Low  . GERD (gastroesophageal reflux disease) 05/16/2013    Priority: Low  . Vitamin D deficiency 01/29/2012    Priority: Low  . Solitary pulmonary nodule 06/08/2011    Priority: Low  . HIATAL HERNIA WITH REFLUX 02/06/2010    Priority: Low  . DEGENERATIVE JOINT DISEASE, KNEE 03/14/2008    Priority: Low  . VARICOSE VEINS LOWER EXTREMITIES W/INFLAMMATION 03/09/2007    Priority: Low  . ALLERGIC RHINITIS 03/09/2007    Priority: Low  . ACTINIC KERATOSIS, FOREHEAD, LEFT 03/09/2007    Priority: Low  . Headache(784.0) 03/09/2007    Priority: Low  . MENIERE'S DISEASE 09/22/2006    Priority: Low  . RAYNAUD'S DISEASE 09/22/2006    Priority: Low    Medications- reviewed  Current Outpatient Prescriptions  Medication Sig Dispense Refill  . amLODipine  (NORVASC) 2.5 MG tablet TAKE 1 TABLET BY MOUTH EVERY DAY 30 tablet 5  . aspirin 81 MG tablet Take 81 mg by mouth daily.      . betamethasone dipropionate (DIPROLENE) 0.05 % cream APPLY TO SKIN AS DIRECTED 15 g 0  . fenofibrate 160 MG tablet Take 1 tablet (160 mg total) by mouth daily. 90 tablet 3  . fluticasone (FLONASE) 50 MCG/ACT nasal spray USE TWO SPRAY(S) IN EACH NOSTRIL AS NEEDED 16 g 8  . loratadine (CLARITIN) 10 MG tablet Take 10 mg by mouth daily. Take 1/2 tablet daily.    . metoprolol (LOPRESSOR) 50 MG tablet TAKE 1/2 TABLET BY MOUTH 2 TIMES DAILY 90 tablet 1  . pantoprazole (PROTONIX) 40 MG tablet TAKE 1 TABLET BY MOUTH EVERY DAY 30 tablet 2  . Vitamin D, Cholecalciferol, 1000 units TABS Take by mouth.     No current facility-administered medications for this visit.     Objective: BP (!) 146/82 (BP Location: Left Arm, Patient Position: Sitting, Cuff Size: Normal)   Pulse 91   Temp 98.6 F (37 C) (Oral)   Ht 5' 4.5" (1.638 m)   Wt 124 lb 9.6 oz (56.5 kg)   SpO2 95%   BMI 21.06 kg/m  Gen: NAD, resting comfortably HEENT: Turbinates erythematous, TM normal, pharynx mildly erythematous with no tonsilar exudate or edema, no sinus tenderness CV: RRR no murmurs rubs or gallops Lungs:  Diffuse mild wheeze- clears only slightly with coughing Ext: no edema Skin:  warm, dry, no rash  Assessment/Plan:  Bronchitis History and exam today are suggestive of viral infection most likely due to bronchitis (seems to have occurred in setting of prior allergic rhinitis not fully controlled as well as prior sinusitis which fortunately hsa improved)  Symptomatic treatment with:  1. Albuterol for wheeze 2. singulair for allergies in addition to claritin 3. Hold off on prednisone repeat since recently had this  We will get a chest x-ray to make sure no pneumonia. Will definitely need antibiotics if pneumonia or also willing to send this in if not improving in next 2-3 days (can mychart [place], call  us , or come see [place])  We discussed that we did not find any infection that had higher probability of being bacterial such as pneumonia, strep throat, ear infection, bacterial sinusitis (especially with sinus symptoms improving). We discussed signs that bacterial infection may have developed particularly fever or shortness of breath and reasons for follow up (symptoms worsen, last past expected time frame, new concerns arise).  Likely course of 3-6 weeks.   Orders Placed This Encounter  Procedures  . DG Chest 2 View    Standing Status:   Future    Number of Occurrences:   1    Standing Expiration Date:   10/17/2017    Order Specific Question:   Reason for Exam (SYMPTOM  OR DIAGNOSIS REQUIRED)    Answer:   cough, wheeze. suspect bronchitis. rule out pneumonia    Order Specific Question:   Preferred imaging location?    Answer:   [facility]   Meds ordered this encounter  Medications  . montelukast (SINGULAIR) 10 MG tablet    Sig: Take 1 tablet (10 mg total) by mouth at bedtime.    Dispense:  30 tablet    Refill:  3  . albuterol (PROVENTIL HFA;VENTOLIN HFA) 108 (90 Base) MCG/ACT inhaler    Sig: Inhale 2 puffs into the lungs every 6 (six) hours as needed for wheezing or shortness of breath.    Dispense:  1 Inhaler    Refill:  0    Garret Reddish, MD

## 2016-08-18 ENCOUNTER — Telehealth: Payer: Self-pay

## 2016-08-18 ENCOUNTER — Encounter: Payer: Self-pay | Admitting: Family Medicine

## 2016-08-18 ENCOUNTER — Other Ambulatory Visit: Payer: Self-pay | Admitting: Family Medicine

## 2016-08-18 NOTE — Telephone Encounter (Signed)
Ok to change to [employer] with same directions as proventil

## 2016-08-18 NOTE — Telephone Encounter (Signed)
Insurance will cover Proair or Proair Respiclick, they do not cover Proventil.

## 2016-08-19 ENCOUNTER — Other Ambulatory Visit: Payer: Self-pay

## 2016-08-19 MED ORDER — ALBUTEROL SULFATE HFA 108 (90 BASE) MCG/ACT IN AERS: 2.0000 | INHALATION_SPRAY | Freq: Four times a day (QID) | RESPIRATORY_TRACT | 1 refills | Status: DC | PRN

## 2016-08-19 MED ORDER — NYSTATIN 100000 UNIT/ML MT SUSP: 5.0000 mL | Freq: Four times a day (QID) | OROMUCOSAL | 0 refills | Status: DC

## 2016-08-19 NOTE — Telephone Encounter (Signed)
Proair sent into the pharmacy

## 2016-08-23 ENCOUNTER — Encounter: Payer: Self-pay | Admitting: Gastroenterology

## 2016-08-25 ENCOUNTER — Other Ambulatory Visit: Payer: Self-pay | Admitting: Family Medicine

## 2016-09-02 ENCOUNTER — Encounter: Payer: Medicare Other | Admitting: Gastroenterology

## 2016-09-09 ENCOUNTER — Ambulatory Visit (INDEPENDENT_AMBULATORY_CARE_PROVIDER_SITE_OTHER): Payer: Medicare Other | Admitting: Family Medicine

## 2016-09-09 ENCOUNTER — Encounter: Payer: Self-pay | Admitting: Family Medicine

## 2016-09-09 DIAGNOSIS — J301 Allergic rhinitis due to pollen: Secondary | ICD-10-CM | POA: Diagnosis not present

## 2016-09-09 DIAGNOSIS — K2270 Barrett's esophagus without dysplasia: Secondary | ICD-10-CM

## 2016-09-09 DIAGNOSIS — I10 Essential (primary) hypertension: Secondary | ICD-10-CM | POA: Diagnosis not present

## 2016-09-09 DIAGNOSIS — E785 Hyperlipidemia, unspecified: Secondary | ICD-10-CM | POA: Diagnosis not present

## 2016-09-09 NOTE — Progress Notes (Signed)
Subjective:  Lori [name] is a 75 y.o. year old very pleasant female patient who presents for/with See problem oriented charting ROS- some ear fullness, some drainage down back of throat. No chest pain or shortness of breaht. No wheeze- prior bronchitis cleared  Past Medical History-  Patient Active Problem List   Diagnosis Date Noted  . Hypertension 11/29/2013    Priority: Medium  . Hyperlipidemia 11/29/2013    Priority: Medium  . Barrett's esophagus 08/24/2013    Priority: Medium  . Osteoporosis 09/22/2006    Priority: Medium  . Aortic atherosclerosis ([place]) 02/11/2016    Priority: Low  . Eczema 11/29/2013    Priority: Low  . GERD (gastroesophageal reflux disease) 05/16/2013    Priority: Low  . Vitamin D deficiency 01/29/2012    Priority: Low  . Solitary pulmonary nodule 06/08/2011    Priority: Low  . HIATAL HERNIA WITH REFLUX 02/06/2010    Priority: Low  . DEGENERATIVE JOINT DISEASE, KNEE 03/14/2008    Priority: Low  . VARICOSE VEINS LOWER EXTREMITIES W/INFLAMMATION 03/09/2007    Priority: Low  . Allergic rhinitis 03/09/2007    Priority: Low  . ACTINIC KERATOSIS, FOREHEAD, LEFT 03/09/2007    Priority: Low  . Headache(784.0) 03/09/2007    Priority: Low  . MENIERE'S DISEASE 09/22/2006    Priority: Low  . RAYNAUD'S DISEASE 09/22/2006    Priority: Low    Medications- reviewed and updated Current Outpatient Prescriptions  Medication Sig Dispense Refill  . albuterol (PROAIR HFA) 108 (90 Base) MCG/ACT inhaler Inhale 2 puffs into the lungs every 6 (six) hours as needed for wheezing or shortness of breath. 18 g 1  . amLODipine (NORVASC) 2.5 MG tablet TAKE 1 TABLET BY MOUTH EVERY DAY 30 tablet 5  . aspirin 81 MG tablet Take 81 mg by mouth daily.      . fenofibrate 160 MG tablet Take 1 tablet (160 mg total) by mouth daily. 90 tablet 3  . loratadine (CLARITIN) 10 MG tablet Take 10 mg by mouth daily. Take 1/2 tablet daily.    . metoprolol tartrate (LOPRESSOR) 50 MG  tablet TAKE 1/2 TABLET BY MOUTH 2 TIMES DAILY 90 tablet 1  . montelukast (SINGULAIR) 10 MG tablet Take 1 tablet (10 mg total) by mouth at bedtime. 30 tablet 3  . pantoprazole (PROTONIX) 40 MG tablet TAKE 1 TABLET BY MOUTH EVERY DAY 30 tablet 3  . Vitamin D, Cholecalciferol, 1000 units TABS Take by mouth.     No current facility-administered medications for this visit.     Objective: BP 138/68 (BP Location: Left Arm, Patient Position: Sitting, Cuff Size: Normal)   Pulse 86   Temp 97.8 F (36.6 C) (Oral)   Ht 5' 4.5" (1.638 m)   Wt 124 lb 12.8 oz (56.6 kg)   SpO2 96%   BMI 21.09 kg/m  Gen: NAD, resting comfortably Tm normal, oropharynx normal CV: RRR no murmurs rubs or gallops Lungs: CTAB no crackles, wheeze, rhonchi Abdomen: soft/nontender/nondistended/normal bowel sounds. No rebound or guarding.  Ext: trace edema Skin: warm, dry  Assessment/Plan:  Hypertension S: controlled on metoprolol 25mg  BID and 2.5mg  amlodipine ASCVD 10 year risk calculation if age 43-79: elevated but now decreased on fenofibrate BP Readings from Last 3 Encounters:  09/09/16 138/68  08/17/16 (!) 146/82  08/09/16 132/72  A/P: We discussed blood pressure goal of <140/90. Continue current meds  Barrett's esophagus S: Endoscopy scheduled July 19thg. Compliant with protonix A/P: continue current meds  Hyperlipidemia S: impressive response on fenofibrate  with LDL from 161 to 74 A/P: continue current meds and recheck at CPE  Allergic rhinitis S: sticking with claritin in AM and singulair in evening- breathing I sbetter. Some ear fullness and drainage in back of throat. Nosebleeds even without flonase. Concerned about the same with astelin A/P: continue current meds. Reasonable control though not perfect with lower SE profile. Asks about prn mucinex and told reasonable.   Return in about 6 months (around 03/11/2017) for physical. or as early as october.  Return precautions advised.  Garret Reddish,  MD

## 2016-09-09 NOTE — Assessment & Plan Note (Signed)
S: controlled on metoprolol 25mg  BID and 2.5mg  amlodipine ASCVD 10 year risk calculation if age 75-79: elevated but now decreased on fenofibrate BP Readings from Last 3 Encounters:  09/09/16 138/68  08/17/16 (!) 146/82  08/09/16 132/72  A/P: We discussed blood pressure goal of <140/90. Continue current meds

## 2016-09-09 NOTE — Assessment & Plan Note (Addendum)
S: sticking with claritin in AM and singulair in evening- breathing I sbetter. Some ear fullness and drainage in back of throat. Nosebleeds even without flonase. Concerned about the same with astelin A/P: continue current meds. Reasonable control though not perfect with lower SE profile. Asks about prn mucinex and told reasonable.

## 2016-09-09 NOTE — Assessment & Plan Note (Signed)
S: impressive response on fenofibrate with LDL from 161 to 74 A/P: continue current meds and recheck at CPE

## 2016-09-09 NOTE — Assessment & Plan Note (Signed)
S: Endoscopy scheduled July 19thg. Compliant with protonix A/P: continue current meds

## 2016-09-15 ENCOUNTER — Other Ambulatory Visit: Payer: Self-pay | Admitting: Family Medicine

## 2016-09-24 ENCOUNTER — Ambulatory Visit (AMBULATORY_SURGERY_CENTER): Payer: Self-pay | Admitting: *Deleted

## 2016-09-24 VITALS — Ht 64.5 in | Wt 128.6 lb

## 2016-09-24 DIAGNOSIS — K2270 Barrett's esophagus without dysplasia: Secondary | ICD-10-CM

## 2016-09-24 NOTE — Progress Notes (Signed)
No egg or soy allergy known to patient  No issues with past sedation with any surgeries  or procedures, no intubation problems but pt states she remembers being told with one surgery she has a small airway and a small mouth but never told difficult intubation that she can remember  No diet pills per patient No home 02 use per patient  No blood thinners per patient  Pt denies issues with constipation  No A fib or A flutter  EMMI video sent to e mail  Husband in [place] today with pt

## 2016-09-27 ENCOUNTER — Encounter: Payer: Self-pay | Admitting: Gastroenterology

## 2016-10-07 ENCOUNTER — Encounter: Payer: Self-pay | Admitting: Gastroenterology

## 2016-10-07 ENCOUNTER — Ambulatory Visit (AMBULATORY_SURGERY_CENTER): Payer: Medicare Other | Admitting: Gastroenterology

## 2016-10-07 VITALS — BP 142/64 | HR 70 | Temp 97.7°F | Resp 18 | Ht 64.5 in | Wt 128.0 lb

## 2016-10-07 DIAGNOSIS — K2270 Barrett's esophagus without dysplasia: Secondary | ICD-10-CM | POA: Diagnosis not present

## 2016-10-07 DIAGNOSIS — Z8719 Personal history of other diseases of the digestive system: Secondary | ICD-10-CM | POA: Diagnosis present

## 2016-10-07 DIAGNOSIS — Z13810 Encounter for screening for upper gastrointestinal disorder: Secondary | ICD-10-CM | POA: Diagnosis not present

## 2016-10-07 DIAGNOSIS — K219 Gastro-esophageal reflux disease without esophagitis: Secondary | ICD-10-CM | POA: Diagnosis not present

## 2016-10-07 MED ORDER — SODIUM CHLORIDE 0.9 % IV SOLN: 500.0000 mL | INTRAVENOUS | Status: DC

## 2016-10-07 MED ORDER — DICYCLOMINE HCL 10 MG PO CAPS: ORAL_CAPSULE | ORAL | 5 refills | Status: DC

## 2016-10-07 NOTE — Progress Notes (Signed)
Pt's states no medical or surgical changes since previsit or office visit. 

## 2016-10-07 NOTE — Patient Instructions (Addendum)
   AWAIT BIOPSY RESULTS   CONTINUE CURRENT MEDICATIONS   REPEAT UPPER ENDOSCOPY IN 3 YEARS  Pick up new medication   YOU HAD AN ENDOSCOPIC PROCEDURE TODAY AT [facility]:   Refer to the procedure report that was given to you for any specific questions about what was found during the examination.  If the procedure report does not answer your questions, please call your gastroenterologist to clarify.  If you requested that your care partner not be given the details of your procedure findings, then the procedure report has been included in a sealed envelope for you to review at your convenience later.  YOU SHOULD EXPECT: Some feelings of bloating in the abdomen. Passage of more gas than usual.  Walking can help get rid of the air that was put into your GI tract during the procedure and reduce the bloating. If you had a lower endoscopy (such as a colonoscopy or flexible sigmoidoscopy) you may notice spotting of blood in your stool or on the toilet paper. If you underwent a bowel prep for your procedure, you may not have a normal bowel movement for a few days.  Please Note:  You might notice some irritation and congestion in your nose or some drainage.  This is from the oxygen used during your procedure.  There is no need for concern and it should clear up in a day or so.  SYMPTOMS TO REPORT IMMEDIATELY:     Following upper endoscopy (EGD)  Vomiting of blood or coffee ground material  New chest pain or pain under the shoulder blades  Painful or persistently difficult swallowing  New shortness of breath  Fever of 100F or higher  Black, tarry-looking stools  For urgent or emergent issues, a gastroenterologist can be reached at any hour by calling [phone].   DIET:  We do recommend a small meal at first, but then you may proceed to your regular diet.  Drink plenty of fluids but you should avoid alcoholic beverages for 24 hours.  ACTIVITY:  You should plan to take it  easy for the rest of today and you should NOT DRIVE or use heavy machinery until tomorrow (because of the sedation medicines used during the test).    FOLLOW UP: Our staff will call the number listed on your records the next business day following your procedure to check on you and address any questions or concerns that you may have regarding the information given to you following your procedure. If we do not reach you, we will leave a message.  However, if you are feeling well and you are not experiencing any problems, there is no need to return our call.  We will assume that you have returned to your regular daily activities without incident.  If any biopsies were taken you will be contacted by phone or by letter within the next 1-3 weeks.  Please call us at [phone] if you have not heard about the biopsies in 3 weeks.    SIGNATURES/CONFIDENTIALITY: You and/or your care partner have signed paperwork which will be entered into your electronic medical record.  These signatures attest to the fact that that the information above on your After Visit Summary has been reviewed and is understood.  Full responsibility of the confidentiality of this discharge information lies with you and/or your care-partner.

## 2016-10-07 NOTE — Op Note (Signed)
[facility] [facility] Patient Name: Lori [name] Procedure Date: 10/07/2016 10:25 AM MRN: [id number] Endoscopist: Ladene Artist , MD Age: 76 Referring MD:  Date of Birth: 1940-07-24 Gender: Female Account #: [id number] Procedure:                Upper GI endoscopy Indications:              Surveillance for malignancy due to personal history                            of Barrett's esophagus Medicines:                Monitored Anesthesia Care Procedure:                Pre-Anesthesia Assessment:                           - Prior to the procedure, a History and Physical                            was performed, and patient medications and                            allergies were reviewed. The patient's tolerance of                            previous anesthesia was also reviewed. The risks                            and benefits of the procedure and the sedation                            options and risks were discussed with the patient.                            All questions were answered, and informed consent                            was obtained. Prior Anticoagulants: The patient has                            taken no previous anticoagulant or antiplatelet                            agents. ASA Grade Assessment: II - A patient with                            mild systemic disease. After reviewing the risks                            and benefits, the patient was deemed in                            satisfactory condition to undergo the procedure.  After obtaining informed consent, the endoscope was                            passed under direct vision. Throughout the                            procedure, the patient's blood pressure, pulse, and                            oxygen saturations were monitored continuously. The                            Endoscope was introduced through the mouth, and                            advanced to the second part of  duodenum. The upper                            GI endoscopy was accomplished without difficulty.                            The patient tolerated the procedure well. Scope In: Scope Out: Findings:                 There were esophageal mucosal changes secondary to                            established short-segment Barrett's disease present                            in the distal esophagus. The maximum longitudinal                            extent of these mucosal changes was 1 cm in length.                            Mucosa was biopsied with a cold forceps for                            histology. One specimen bottle was sent to                            pathology.                           The exam of the esophagus was otherwise normal.                           A small hiatal hernia was present.                           A few small sessile polyps were found in the  gastric body c/w benign fundic gland polyps.                           The exam of the stomach was otherwise normal.                           The duodenal bulb and second portion of the                            duodenum were normal. Complications:            No immediate complications. Estimated Blood Loss:     Estimated blood loss was minimal. Impression:               - Esophageal mucosal changes secondary to                            established short-segment Barrett's disease.                            Biopsied.                           - Small hiatal hernia.                           - Small gastric polyps                           - Normal duodenal bulb and second portion of the                            duodenum. Recommendation:           - Patient has a contact number available for                            emergencies. The signs and symptoms of potential                            delayed complications were discussed with the                            patient. Return to  normal activities tomorrow.                            Written discharge instructions were provided to the                            patient.                           - Resume previous diet.                           - Continue present medications.                           -  Await pathology results.                           - Repeat upper endoscopy in 3 years for                            surveillance if no dysplasia. Ladene Artist, MD 10/07/2016 10:47:49 AM This report has been signed electronically.

## 2016-10-07 NOTE — Progress Notes (Signed)
Called to room to assist during endoscopic procedure.  Patient ID and intended procedure confirmed with present staff. Received instructions for my participation in the procedure from the performing physician.  

## 2016-10-07 NOTE — Progress Notes (Signed)
Report to PACU, RN, vss, BBS= Clear.  

## 2016-10-08 ENCOUNTER — Telehealth: Payer: Self-pay

## 2016-10-08 ENCOUNTER — Ambulatory Visit (INDEPENDENT_AMBULATORY_CARE_PROVIDER_SITE_OTHER): Payer: Medicare Other | Admitting: Family Medicine

## 2016-10-08 ENCOUNTER — Encounter: Payer: Self-pay | Admitting: Family Medicine

## 2016-10-08 VITALS — BP 142/82 | HR 79 | Temp 98.4°F | Ht 64.5 in | Wt 126.6 lb

## 2016-10-08 DIAGNOSIS — R109 Unspecified abdominal pain: Secondary | ICD-10-CM | POA: Diagnosis not present

## 2016-10-08 DIAGNOSIS — R1032 Left lower quadrant pain: Secondary | ICD-10-CM

## 2016-10-08 LAB — BASIC METABOLIC PANEL
BUN: 25 mg/dL (ref 7–25)
CHLORIDE: 103 mmol/L (ref 98–110)
CO2: 25 mmol/L (ref 20–31)
CREATININE: 0.73 mg/dL (ref 0.60–0.93)
Calcium: 9.6 mg/dL (ref 8.6–10.4)
Glucose, Bld: 83 mg/dL (ref 65–99)
POTASSIUM: 4.4 mmol/L (ref 3.5–5.3)
Sodium: 137 mmol/L (ref 135–146)

## 2016-10-08 LAB — CBC WITH DIFFERENTIAL/PLATELET
BASOS ABS: 0 {cells}/uL (ref 0–200)
Basophils Relative: 0 %
EOS ABS: 216 {cells}/uL (ref 15–500)
Eosinophils Relative: 3 %
HEMATOCRIT: 35.7 % (ref 35.0–45.0)
Hemoglobin: 11.7 g/dL (ref 11.7–15.5)
LYMPHS PCT: 37 %
Lymphs Abs: 2664 cells/uL (ref 850–3900)
MCH: 27.7 pg (ref 27.0–33.0)
MCHC: 32.8 g/dL (ref 32.0–36.0)
MCV: 84.6 fL (ref 80.0–100.0)
MONO ABS: 1008 {cells}/uL — AB (ref 200–950)
MONOS PCT: 14 %
MPV: 10.2 fL (ref 7.5–12.5)
Neutro Abs: 3312 cells/uL (ref 1500–7800)
Neutrophils Relative %: 46 %
PLATELETS: 367 10*3/uL (ref 140–400)
RBC: 4.22 MIL/uL (ref 3.80–5.10)
RDW: 14.7 % (ref 11.0–15.0)
WBC: 7.2 10*3/uL (ref 3.8–10.8)

## 2016-10-08 LAB — POC URINALSYSI DIPSTICK (AUTOMATED)
BILIRUBIN UA: NEGATIVE
Blood, UA: NEGATIVE
GLUCOSE UA: NEGATIVE
Ketones, UA: NEGATIVE
LEUKOCYTES UA: NEGATIVE
NITRITE UA: NEGATIVE
Protein, UA: NEGATIVE
Spec Grav, UA: 1.02 (ref 1.010–1.025)
Urobilinogen, UA: 0.2 E.U./dL
pH, UA: 6 (ref 5.0–8.0)

## 2016-10-08 NOTE — Telephone Encounter (Signed)
  Follow up Call-  Call back number 10/07/2016  Post procedure Call Back phone  # [phone]  Permission to leave phone message Yes  Some recent data might be hidden     Patient questions:  Do you have a fever, pain , or abdominal swelling? No. Pain Score  0 *  Have you tolerated food without any problems? Yes.    Have you been able to return to your normal activities? Yes.    Do you have any questions about your discharge instructions: Diet   No. Medications  No. Follow up visit  No.  Do you have questions or concerns about your Care? No.  Actions: * If pain score is 4 or above: No action needed, pain <4.  No problems noted per pt. maw

## 2016-10-08 NOTE — Progress Notes (Signed)
Subjective:  Varetta Chavers is a 76 y.o. year old very pleasant female patient who presents for/with See problem oriented charting ROS- no fever, chills, nausea, vomiting. Has had regular bowel movements other than diarrhea before EGD.    Past Medical History-  Patient Active Problem List   Diagnosis Date Noted  . Hypertension 11/29/2013    Priority: Medium  . Hyperlipidemia 11/29/2013    Priority: Medium  . Barrett's esophagus 08/24/2013    Priority: Medium  . Osteoporosis 09/22/2006    Priority: Medium  . Aortic atherosclerosis ([place]) 02/11/2016    Priority: Low  . Eczema 11/29/2013    Priority: Low  . GERD (gastroesophageal reflux disease) 05/16/2013    Priority: Low  . Vitamin D deficiency 01/29/2012    Priority: Low  . Solitary pulmonary nodule 06/08/2011    Priority: Low  . HIATAL HERNIA WITH REFLUX 02/06/2010    Priority: Low  . DEGENERATIVE JOINT DISEASE, KNEE 03/14/2008    Priority: Low  . VARICOSE VEINS LOWER EXTREMITIES W/INFLAMMATION 03/09/2007    Priority: Low  . Allergic rhinitis 03/09/2007    Priority: Low  . ACTINIC KERATOSIS, FOREHEAD, LEFT 03/09/2007    Priority: Low  . Headache(784.0) 03/09/2007    Priority: Low  . MENIERE'S DISEASE 09/22/2006    Priority: Low  . RAYNAUD'S DISEASE 09/22/2006    Priority: Low    Medications- reviewed and updated Current Outpatient Prescriptions  Medication Sig Dispense Refill  . albuterol (PROAIR HFA) 108 (90 Base) MCG/ACT inhaler Inhale 2 puffs into the lungs every 6 (six) hours as needed for wheezing or shortness of breath. 18 g 1  . amLODipine (NORVASC) 2.5 MG tablet TAKE 1 TABLET BY MOUTH EVERY DAY 30 tablet 5  . aspirin 81 MG tablet Take 81 mg by mouth daily.      Marland Kitchen dicyclomine (BENTYL) 10 MG capsule TAKE 3 TIMES A DAY BEFORE MEALS AS NEEDED FOR ABDOMINAL DISCOMFORT 90 capsule 5  . fenofibrate 160 MG tablet TAKE 1 TABLET BY MOUTH EVERY DAY 90 tablet 2  . loratadine (CLARITIN) 10 MG tablet Take 10 mg by  mouth daily. Take 1/2 tablet daily.    . metoprolol tartrate (LOPRESSOR) 50 MG tablet TAKE 1/2 TABLET BY MOUTH 2 TIMES DAILY 90 tablet 1  . montelukast (SINGULAIR) 10 MG tablet Take 1 tablet (10 mg total) by mouth at bedtime. 30 tablet 3  . pantoprazole (PROTONIX) 40 MG tablet TAKE 1 TABLET BY MOUTH EVERY DAY 30 tablet 3  . Vitamin D, Cholecalciferol, 1000 units TABS Take by mouth.     No current facility-administered medications for this visit.     Objective: BP (!) 142/82 (BP Location: Left Arm, Patient Position: Sitting, Cuff Size: Normal)   Pulse 79   Temp 98.4 F (36.9 C) (Oral)   Ht 5' 4.5" (1.638 m)   Wt 126 lb 9.6 oz (57.4 kg)   SpO2 96%   BMI 21.40 kg/m  Gen: NAD, resting comfortably CV: RRR no murmurs rubs or gallops Lungs: CTAB no crackles, wheeze, rhonchi Abdomen: soft/mildly tender LLQ and into groin without hernia- no flank pain/nondistended/normal bowel sounds. No rebound or guarding.  Ext: no edema Skin: warm, dry  Results for orders placed or performed in visit on 10/08/16 (from the past 24 hour(s))  POCT Urinalysis Dipstick (Automated)     Status: None   Collection Time: 10/08/16  3:48 PM  Result Value Ref Range   Color, UA yellow    Clarity, UA clear  Glucose, UA n    Bilirubin, UA n    Ketones, UA n    Spec Grav, UA 1.020 1.010 - 1.025   Blood, UA n    pH, UA 6.0 5.0 - 8.0   Protein, UA n    Urobilinogen, UA 0.2 0.2 or 1.0 E.U./dL   Nitrite, UA n    Leukocytes, UA Negative Negative  Basic metabolic panel     Status: None   Collection Time: 10/08/16  4:08 PM  Result Value Ref Range   Sodium 137 135 - 146 mmol/L   Potassium 4.4 3.5 - 5.3 mmol/L   Chloride 103 98 - 110 mmol/L   CO2 25 20 - 31 mmol/L   Glucose, Bld 83 65 - 99 mg/dL   BUN 25 7 - 25 mg/dL   Creat 0.73 0.60 - 0.93 mg/dL   Calcium 9.6 8.6 - 10.4 mg/dL   Narrative   Performed at:  [facility], [address]                [place], [place] [address]   CBC with Differential/Platelet     Status: Abnormal   Collection Time: 10/08/16  4:08 PM  Result Value Ref Range   WBC 7.2 3.8 - 10.8 K/uL   RBC 4.22 3.80 - 5.10 MIL/uL   Hemoglobin 11.7 11.7 - 15.5 g/dL   HCT 35.7 35.0 - 45.0 %   MCV 84.6 80.0 - 100.0 fL   MCH 27.7 27.0 - 33.0 pg   MCHC 32.8 32.0 - 36.0 g/dL   RDW 14.7 11.0 - 15.0 %   Platelets 367 140 - 400 K/uL   MPV 10.2 7.5 - 12.5 fL   Neutro Abs 3,312 1,500 - 7,800 cells/uL   Lymphs Abs 2,664 850 - 3,900 cells/uL   Monocytes Absolute 1,008 (H) 200 - 950 cells/uL   Eosinophils Absolute 216 15 - 500 cells/uL   Basophils Absolute 0 0 - 200 cells/uL   Neutrophils Relative % 46 %   Lymphocytes Relative 37 %   Monocytes Relative 14 %   Eosinophils Relative 3 %   Basophils Relative 0 %   Smear Review Criteria for review not met    Narrative   Performed at:  [facility], [address]                [place], [place] [address]    Assessment/Plan:  Left flank/groin pain - Plan: POCT Urinalysis Dipstick (Automated), CT Abdomen Pelvis W Contrast, Basic metabolic panel, CBC with Differential/Platelet,  S: intermittent left groin pain for years. Dr. Fuller Plan prescribed medicine for IBS bentyl- which she tried and didn't help.   Current episode on and off- also hurts into left flank at times. At least 2 weeks ago- seems to be getting slightly worse. Had cat scan in past and was thought to probably have passed kidney stone for similar type pain.  Had diarrhea before EGD yesterday x1. Otherwise has had regular daily non firm bowel movements. Sharp pain up to 7/10. Tylenol takes the edge off.  A/P: Intermittent left flank and groin pain  For 2 weeks now worsening in pain level and frequency. Possible stones- get CT to evaluate. Also get contrast study to evaluate for potential diverticulitis though doubt particularly with normal WBC.  Patient  Instructions  Will get scan next week hopefully to look for kidney  stones. Seek care for new (particularly fever) or worsening symptoms over weekend- such as an urgent care or ER  Possibly could be kidney stone. Glad tylenol helping  Please stop by lab before you go   Orders Placed This Encounter  Procedures  . CT Abdomen Pelvis W Contrast    SS. Hilda Blades [phone] [id number]/ [employer] MCR / not diabetic/ will get labs     Standing Status:   Future    Standing Expiration Date:   01/08/2018    Order Specific Question:   If indicated for the ordered procedure, I authorize the administration of contrast media per Radiology protocol    Answer:   Yes    Order Specific Question:   Reason for Exam (SYMPTOM  OR DIAGNOSIS REQUIRED)    Answer:   left flank pain now with left lower quadrant/groin pain- history nephrolithiasis. Evaluate for stones as well as diverticulitis    Order Specific Question:   Preferred imaging location?    Answer:   [facility]    Order Specific Question:   Radiology Contrast Protocol - do NOT remove file path    Answer:   [email or web address]  . Basic metabolic panel  . CBC with Differential/Platelet  . POCT Urinalysis Dipstick (Automated)   Return precautions advised particularly for ER care.  Garret Reddish, MD

## 2016-10-08 NOTE — Patient Instructions (Addendum)
Will get scan next week hopefully to look for kidney stones. Seek care for new (particularly fever) or worsening symptoms over weekend- such as an urgent care or ER  Possibly could be kidney stone. Glad tylenol helping  Please stop by lab before you go

## 2016-10-10 ENCOUNTER — Encounter: Payer: Self-pay | Admitting: Family Medicine

## 2016-10-11 ENCOUNTER — Telehealth: Payer: Self-pay

## 2016-10-11 ENCOUNTER — Other Ambulatory Visit: Payer: Medicare Other

## 2016-10-11 NOTE — Telephone Encounter (Signed)
Pt has intermittent abdominal pain on the lower left side. Bentyl has not helped.  She is scheduled for a CT abd/pelvis ordered by Dr Yong Channel (see 10-08-16 note) . Patient has questions about having to drink the oral contrast with her history of barretts esophagus and is concerned about having to drink 2 bottles.

## 2016-10-11 NOTE — Telephone Encounter (Signed)
Looks like patient is scheduled for CT on 7/25 (Wed) and is concerned about drinking the contrast with history of Barrett's. Please advise.

## 2016-10-11 NOTE — Telephone Encounter (Signed)
Contrast will not impact her barrett's at all. Proceed with CT as scheduled.

## 2016-10-11 NOTE — Telephone Encounter (Signed)
Patient advised to proceed with with CT, oral contrast is okay.

## 2016-10-13 ENCOUNTER — Encounter: Payer: Self-pay | Admitting: Family Medicine

## 2016-10-13 ENCOUNTER — Ambulatory Visit: Payer: Medicare Other

## 2016-10-13 ENCOUNTER — Ambulatory Visit (INDEPENDENT_AMBULATORY_CARE_PROVIDER_SITE_OTHER)
Admission: RE | Admit: 2016-10-13 | Discharge: 2016-10-13 | Disposition: A | Discharge status: Home / Self Care | Payer: Medicare Other | Source: Ambulatory Visit | Attending: Family Medicine | Admitting: Family Medicine

## 2016-10-13 DIAGNOSIS — R109 Unspecified abdominal pain: Secondary | ICD-10-CM

## 2016-10-13 DIAGNOSIS — R1032 Left lower quadrant pain: Secondary | ICD-10-CM

## 2016-10-13 DIAGNOSIS — K8020 Calculus of gallbladder without cholecystitis without obstruction: Secondary | ICD-10-CM | POA: Diagnosis not present

## 2016-10-13 MED ORDER — IOPAMIDOL (ISOVUE-300) INJECTION 61%
100.0000 mL | Freq: Once | INTRAVENOUS | Status: AC | PRN
  Administered 2016-10-13: 100 mL via INTRAVENOUS

## 2016-10-14 ENCOUNTER — Encounter: Payer: Self-pay | Admitting: Gastroenterology

## 2016-10-21 NOTE — Progress Notes (Addendum)
Subjective:   Lori [name] is a 76 y.o. female who presents for Medicare Annual (Subsequent) preventive examination.  The Patient was informed that the wellness visit is to identify future health risk and educate and initiate measures that can reduce risk for increased disease through the lifespan.    Annual Wellness Assessment  Reports health as good  Preventive Screening -Counseling & Management  Medicare Annual Preventive Care Visit - Subsequent Last OV  Was 10/08/2016 for left flank pain which has improved   labs reviewed;   cholesterol was 149 ;HDL was 69 ; LDL 74 and November   colonoscopy was completed December 2014;Due in December 2019 declines pneumonia vaccinations due to allergy   mammogram was completed March of this year    bone density was completed in May,  The patient has osteopenia comment lumbar spine -2.3 ;  The femur was -1.9 States this was improved From her last DEXA   Educated regarding the DEXA scan-  Taking vitamin D Dr Yong Channel discussed drinking less milk per the patient   States she has been keeping her 86-year-old granddaughter for 3 years. States her daughter and granddaughter left today to go to [place] and is sad but is looking forward to less stress.   There is a 20 year difference in the ages of her children. Children are  48; 59 37 30 3 grand; 2 boys 1 girl  VS reviewed;  last blood pressure was 142/82 in July 2018   in June , it was 138/68 States she was in pain when bp was elevated, now it is fine    Diet  Has barrett's esphagus Am cream of wheat  Has been to nutritionist Lunch chicken  Supper vegetables  Takes meds whic help control symptoms Drink water    BMI 21  Exercise has been busy taking care of 52-year-old. Is looking forward to starting a walking program and can. May try to go to the pool with her spouse if they can find one handicapped assessable.    Advanced Directives- completed  Patient Care  Team: [name], MD as PCP - General (Family Medicine) Assessed for additional providers  Immunization History  Administered Date(s) Administered  . Influenza Split 01/04/2012  . Influenza Whole 01/03/2007, 12/08/2007, 12/17/2008, 01/20/2010  . Influenza, High Dose Seasonal PF 01/10/2015, 12/12/2015  . Influenza,inj,Quad PF,36+ Mos 01/05/2013, 12/24/2013  . Pneumococcal Polysaccharide-23 02/12/2004  . Td 03/23/2003, 04/04/2013   Required Immunizations needed today  Screening test up to date or reviewed for plan of completion Health Maintenance Due  Topic Date Due  . INFLUENZA VACCINE  10/20/2016     the flu vacine is not available at this time.   Keep in mind the flu shot is an inactivated vaccine and takes at least 2 weeks to build immunity. The flu virus can be dormant for 4 days prior to symptoms Taking the flu shot at the beginning of the season can reduce the risk for the entire community.         Objective:     Vitals: Ht _0  (1.626 m)   Wt 124 lb (56.2 kg)   BMI 21.28 kg/m   Body mass index is 21.28 kg/m.   Tobacco History  Smoking Status  . Never Smoker  Smokeless Tobacco  . Never Used     Counseling given: Yes   Past Medical History:  Diagnosis Date  . Allergy   . Anemia    past hx of anemia  .  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 Arthritis    knee  . Barrett's esophagus   . Cataract    bilateral   . 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 Eczema   . GERD (gastroesophageal reflux disease)   . Headache(784.0)   . History of IBS   . Hyperlipidemia    on medication  . Hypertension   . Low back pain   . Meniere disease   . 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 Neuromuscular disorder (H[facility])    Hiatal Hernia, Raynauds disease  . Osteopenia   . 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HCC)   . 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 Tubular adenoma of  colon 02/2013  . Varicose veins with inflammation   . Vasculitis [facility])    Past Surgical History:  Procedure Laterality Date  . arthroscopic knee     left knee/ torn meniscus  . BUNIONECTOMY Right 1998   with other foot surgery   . carotid artery disection  1988   Carotid Artery Dissection  . CESAREAN SECTION     1 time  . COLONOSCOPY    . DILATION AND CURETTAGE OF UTERUS    . POLYPECTOMY    . POPLITEAL SYNOVIAL CYST EXCISION     left leg  . TONSILLECTOMY     Family History  Problem Relation Age of Onset  . Multiple myeloma Father   . Kidney disease Brother        cancer- removed  . Prostate cancer Brother   . Thyroid cancer Sister        s/p removal. papilary and anaplastic.   . Lung cancer Sister   . Alzheimer's disease Brother        older broterh  . Stroke Brother        88-older brother  . Colon cancer Neg Hx   . Esophageal cancer Neg Hx   . Rectal cancer Neg Hx   . Stomach cancer Neg Hx   . Colon polyps Neg Hx    History  Sexual Activity  . Sexual activity: Yes    Outpatient Encounter Prescriptions as of 10/22/2016  Medication Sig  . albuterol (PROAIR HFA) 108 (90 Base) MCG/ACT inhaler Inhale 2 puffs into the lungs every 6 (six) hours as needed for wheezing or shortness of breath.  Marland Kitchen amLODipine (NORVASC) 2.5 MG tablet TAKE 1 TABLET BY MOUTH EVERY DAY  . aspirin 81 MG tablet Take 81 mg by mouth daily.    Marland Kitchen dicyclomine (BENTYL) 10 MG capsule TAKE 3 TIMES A DAY BEFORE MEALS AS NEEDED FOR ABDOMINAL DISCOMFORT  . fenofibrate 160 MG tablet TAKE 1 TABLET BY MOUTH EVERY DAY  . loratadine (CLARITIN) 10 MG tablet Take 10 mg by mouth daily. Take 1/2 tablet daily.  . metoprolol tartrate (LOPRESSOR) 50 MG tablet TAKE 1/2 TABLET BY MOUTH 2 TIMES DAILY  . montelukast (SINGULAIR) 10 MG tablet Take 1 tablet (10 mg total) by mouth at bedtime.  . pantoprazole (PROTONIX) 40 MG tablet TAKE 1 TABLET BY MOUTH EVERY DAY  . Vitamin D, Cholecalciferol, 1000 units TABS Take by mouth.    No facility-administered encounter medications on file as of 10/22/2016.     Activities of Daily Living No flowsheet data found.  Patient Care Team: [name], MD as PCP - General (Family Medicine)    Assessment:   Exercise Activities and Dietary  recommendations    Goals    . patient          Wants to take of herself  Plan to start walking 2 neighbors   May consider swimming    . patient centered          [place] [facility]/ [place] [facility]/ fall You can check out what we have discussed to increase mobility      Fall Risk Fall Risk  12/12/2015 05/30/2014 10/02/2012  Falls in the past year? No No Yes  Number falls in past yr: - - 1  Injury with Fall? - - No  Risk for fall due to : - - Other (Comment)   Depression Screen PHQ 2/9 Scores 12/12/2015 05/30/2014 10/02/2012 10/27/2011  PHQ - 2 Score 0 0 1 1     Cognitive Function Ad8 score reviewed for issues:  Issues making decisions:  Less interest in hobbies / activities:  Repeats questions, stories (family complaining):  Trouble using ordinary gadgets (microwave, computer, phone):  Forgets the month or year:   Mismanaging finances:   Remembering appts:  Daily problems with thinking and/or memory: Ad8 score is=0         Immunization History  Administered Date(s) Administered  . Influenza Split 01/04/2012  . Influenza Whole 01/03/2007, 12/08/2007, 12/17/2008, 01/20/2010  . Influenza, High Dose Seasonal PF 01/10/2015, 12/12/2015  . Influenza,inj,Quad PF,36+ Mos 01/05/2013, 12/24/2013  . Pneumococcal Polysaccharide-23 02/12/2004  . Td 03/23/2003, 04/04/2013   Screening Tests Health Maintenance  Topic Date Due  . INFLUENZA VACCINE  10/20/2016  . PNA vac Low Risk Adult (1 of 2 - PCV13) 05/29/2024 (Originally 03/11/2007)  . COLONOSCOPY  03/13/2018  . MAMMOGRAM  06/10/2018  . TETANUS/TDAP  04/05/2023  . DEXA SCAN  Completed      Plan:      PCP Notes   Health Maintenance The patient did have a  DEXA scan. States she is osteopenic. She did have osteoporosis which is now improved  per her report. Has not been on any medication. Has been drinking a lot of milk.   Abnormal Screens  The patient states she has Barrett's esophagus. She recently had an endoscopic exam and received her report which was negative for cancer. She was informed to have an repeat endoscopy in 3 years.   Referrals  none  Patient concerns; S/p reflux symptoms endoscopic. She complains that she has been symptomatic 2 weeks. Encouraged her to call her GI doctor and she will do so if her discomfort continues.  Nurse Concerns; No concerns at this time.  Next PCP apt  12/2016   I have personally reviewed and noted the following in the patient's chart:   . Medical and social history . Use of alcohol, tobacco or illicit drugs  . Current medications and supplements . Functional ability and status . Nutritional status . Physical activity . Advanced directives . List of other physicians . Hospitalizations, surgeries, and ER visits in previous 12 months . Vitals . Screenings to include cognitive, depression, and falls . Referrals and appointments  In addition, I have reviewed and discussed with patient certain preventive protocols, quality metrics, and best practice recommendations. A written personalized care plan for preventive services as well as general preventive health recommendations were provided to patient.     [id number],[id number], RN  10/22/2016  Agree with assessment as above.  Eulas Post MD [facility]

## 2016-10-22 ENCOUNTER — Ambulatory Visit (INDEPENDENT_AMBULATORY_CARE_PROVIDER_SITE_OTHER): Payer: Medicare Other

## 2016-10-22 VITALS — Ht 64.0 in | Wt 124.0 lb

## 2016-10-22 DIAGNOSIS — Z0000 Encounter for general adult medical examination without abnormal findings: Secondary | ICD-10-CM | POA: Diagnosis not present

## 2016-10-22 NOTE — Patient Instructions (Addendum)
Lori [name] , Thank you for taking time to come for your Medicare Wellness Visit. I appreciate your ongoing commitment to your health goals. Please review the following plan we discussed and let me know if I can assist you in the future.   Recommendations for Dexa Scan Female over the age of 50 Man age 75 or older If you broke a bone past the age of 95 Women menopausal age with risk factors (thin frame; smoker; hx of fx ) Post menopausal women under the age of 65 with risk factors A man age 21 to 41 with risk factors Other: Spine xray that is showing break of bone loss Back pain with possible break Height loss of 1/2 inch or more within one year Total loss in height of 1.5 inches from your original height  Calcium '1200mg'$  with Vit D 800u per day; more as directed by physician Strength building exercises discussed; can include walking; housework; small weights or stretch bands; silver sneakers if access to the [facility]  Please visit the osteoporosis foundation.org for up to date recommendations  Shingrix is a vaccine for the prevention of Shingles in Adults 50 and older.  If you are on Medicare, you can request a prescription from your doctor to be filled at a pharmacy.  Please check with your benefits regarding applicable copays or out of pocket expenses.  The Shingrix is given in 2 vaccines approx 8 weeks apart. You must receive the 2nd dose prior to 6 months from receipt of the first.    These are the goals we discussed: Goals    . patient          Wants to take of herself  Plan to start walking 2 neighbors   May consider swimming    . patient centered          [place] [facility]/ [place] [facility]/ fall You can check out what we have discussed to increase mobility       This is a list of the screening recommended for you and due dates:  Health Maintenance  Topic Date Due  . Flu Shot  10/20/2016  . Pneumonia vaccines (1 of 2 - PCV13) 05/29/2024*  . Colon Cancer Screening  03/13/2018   . Mammogram  06/10/2018  . Tetanus Vaccine  04/05/2023  . DEXA scan (bone density measurement)  Completed  *Topic was postponed. The date shown is not the original due date.   Prevention of falls: Remove rugs or any tripping hazards in the home Use Non slip mats in bathtubs and showers Placing grab bars next to the toilet and or shower Placing handrails on both sides of the stair way Adding extra lighting in the home.   Personal safety issues reviewed:  1. Consider starting a community watch program per [facility] 2.  Changes batteries is smoke detector and/or carbon monoxide detector  3.  If you have firearms; keep them in a safe place 4.  Wear protection when in the sun; Always wear sunscreen or a hat; It is good to have your doctor check your skin annually or review any new areas of concern 5. Driving safety; Keep in the right lane; stay 3 car lengths behind the car in front of you on the highway; look 3 times prior to pulling out; carry your cell phone everywhere you go!    Learn about the Yellow Dot program:  The program allows first responders at your emergency to have access to who your physician is, as  well as your medications and medical conditions.  Citizens requesting the Yellow Dot Packages should contact Master Corporal Gae Dry at the [facility] [phone] for the first week of the program and beginning the week after Easter citizens should contact their Water engineer.      Falls can be the main reason people lose their independence Think before you CLIMB  4 things you can do to prevent falls 1. Begin an exercise program to improve your leg strength and balance 2. Ask the doctor to review medicines for fall risk 3. Get an annual eye check up and update your eye glasses;  (the [employer] still assist with eyewear:  Reviewed for annual vision exam;The [facility] assistance for eyewear is coordinated through [facility];  Please call Loletha Carrow at [phone] 4. Make your home safer by: Removing clutter and tripping hazzards Putting railing on stairs and adding grab bars to the bathroom  Have good lighting; especially on stairs and at night when getting up to the bathroom   Exercise; including walking can assist with maintaining tone and balance and help prevent falls as you age.         Health Maintenance, Female Adopting a healthy lifestyle and getting preventive care can go a long way to promote health and wellness. Talk with your health care provider about what schedule of regular examinations is right for you. This is a good chance for you to check in with your provider about disease prevention and staying healthy. In between checkups, there are plenty of things you can do on your own. Experts have done a lot of research about which lifestyle changes and preventive measures are most likely to keep you healthy. Ask your health care provider for more information. Weight and diet Eat a healthy diet  Be sure to include plenty of vegetables, fruits, low-fat dairy products, and lean protein.  Do not eat a lot of foods high in solid fats, added sugars, or salt.  Get regular exercise. This is one of the most important things you can do for your health. ? Most adults should exercise for at least 150 minutes each week. The exercise should increase your heart rate and make you sweat (moderate-intensity exercise). ? Most adults should also do strengthening exercises at least twice a week. This is in addition to the moderate-intensity exercise.  Maintain a healthy weight  Body mass index (BMI) is a measurement that can be used to identify possible weight problems. It estimates body fat based on height and weight. Your health care provider can help determine your BMI and help you achieve or maintain a healthy weight.  For females 106 years of age and older: ? A BMI below 18.5 is considered underweight. ? A  BMI of 18.5 to 24.9 is normal. ? A BMI of 25 to 29.9 is considered overweight. ? A BMI of 30 and above is considered obese.  Watch levels of cholesterol and blood lipids  You should start having your blood tested for lipids and cholesterol at 75 years of age, then have this test every 5 years.  You may need to have your cholesterol levels checked more often if: ? Your lipid or cholesterol levels are high. ? You are older than 75 years of age. ? You are at high risk for heart disease.  Cancer screening Lung Cancer  Lung cancer screening is recommended for adults 74-29 years old who are at high risk for lung cancer because of a history  of smoking.  A yearly low-dose CT scan of the lungs is recommended for people who: ? Currently smoke. ? Have quit within the past 15 years. ? Have at least a 30-pack-year history of smoking. A pack year is smoking an average of one pack of cigarettes a day for 1 year.  Yearly screening should continue until it has been 15 years since you quit.  Yearly screening should stop if you develop a health problem that would prevent you from having lung cancer treatment.  Breast Cancer  Practice breast self-awareness. This means understanding how your breasts normally appear and feel.  It also means doing regular breast self-exams. Let your health care provider know about any changes, no matter how small.  If you are in your 20s or 30s, you should have a clinical breast exam (CBE) by a health care provider every 1-3 years as part of a regular health exam.  If you are 46 or older, have a CBE every year. Also consider having a breast X-ray (mammogram) every year.  If you have a family history of breast cancer, talk to your health care provider about genetic screening.  If you are at high risk for breast cancer, talk to your health care provider about having an MRI and a mammogram every year.  Breast cancer gene (BRCA) assessment is recommended for women who  have family members with BRCA-related cancers. BRCA-related cancers include: ? Breast. ? Ovarian. ? Tubal. ? Peritoneal cancers.  Results of the assessment will determine the need for genetic counseling and BRCA1 and BRCA2 testing.  Cervical Cancer Your health care provider may recommend that you be screened regularly for cancer of the pelvic organs (ovaries, uterus, and vagina). This screening involves a pelvic examination, including checking for microscopic changes to the surface of your cervix (Pap test). You may be encouraged to have this screening done every 3 years, beginning at age 12.  For women ages 70-65, health care providers may recommend pelvic exams and Pap testing every 3 years, or they may recommend the Pap and pelvic exam, combined with testing for human papilloma virus (HPV), every 5 years. Some types of HPV increase your risk of cervical cancer. Testing for HPV may also be done on women of any age with unclear Pap test results.  Other health care providers may not recommend any screening for nonpregnant women who are considered low risk for pelvic cancer and who do not have symptoms. Ask your health care provider if a screening pelvic exam is right for you.  If you have had past treatment for cervical cancer or a condition that could lead to cancer, you need Pap tests and screening for cancer for at least 20 years after your treatment. If Pap tests have been discontinued, your risk factors (such as having a new sexual partner) need to be reassessed to determine if screening should resume. Some women have medical problems that increase the chance of getting cervical cancer. In these cases, your health care provider may recommend more frequent screening and Pap tests.  Colorectal Cancer  This type of cancer can be detected and often prevented.  Routine colorectal cancer screening usually begins at 75 years of age and continues through 75 years of age.  Your health care  provider may recommend screening at an earlier age if you have risk factors for colon cancer.  Your health care provider may also recommend using home test kits to check for hidden blood in the stool.  A small camera  at the end of a tube can be used to examine your colon directly (sigmoidoscopy or colonoscopy). This is done to check for the earliest forms of colorectal cancer.  Routine screening usually begins at age 20.  Direct examination of the colon should be repeated every 5-10 years through 75 years of age. However, you may need to be screened more often if early forms of precancerous polyps or small growths are found.  Skin Cancer  Check your skin from head to toe regularly.  Tell your health care provider about any new moles or changes in moles, especially if there is a change in a mole's shape or color.  Also tell your health care provider if you have a mole that is larger than the size of a pencil eraser.  Always use sunscreen. Apply sunscreen liberally and repeatedly throughout the day.  Protect yourself by wearing long sleeves, pants, a wide-brimmed hat, and sunglasses whenever you are outside.  Heart disease, diabetes, and high blood pressure  High blood pressure causes heart disease and increases the risk of stroke. High blood pressure is more likely to develop in: ? People who have blood pressure in the high end of the normal range (130-139/85-89 mm Hg). ? People who are overweight or obese. ? People who are African American.  If you are 46-1 years of age, have your blood pressure checked every 3-5 years. If you are 47 years of age or older, have your blood pressure checked every year. You should have your blood pressure measured twice-once when you are at a hospital or clinic, and once when you are not at a hospital or clinic. Record the average of the two measurements. To check your blood pressure when you are not at a hospital or clinic, you can use: ? An automated  blood pressure machine at a pharmacy. ? A home blood pressure monitor.  If you are between 36 years and 39 years old, ask your health care provider if you should take aspirin to prevent strokes.  Have regular diabetes screenings. This involves taking a blood sample to check your fasting blood sugar level. ? If you are at a normal weight and have a low risk for diabetes, have this test once every three years after 75 years of age. ? If you are overweight and have a high risk for diabetes, consider being tested at a younger age or more often. Preventing infection Hepatitis B  If you have a higher risk for hepatitis B, you should be screened for this virus. You are considered at high risk for hepatitis B if: ? You were born in a country where hepatitis B is common. Ask your health care provider which countries are considered high risk. ? Your parents were born in a high-risk country, and you have not been immunized against hepatitis B (hepatitis B vaccine). ? You have HIV or AIDS. ? You use needles to inject street drugs. ? You live with someone who has hepatitis B. ? You have had sex with someone who has hepatitis B. ? You get hemodialysis treatment. ? You take certain medicines for conditions, including cancer, organ transplantation, and autoimmune conditions.  Hepatitis C  Blood testing is recommended for: ? Everyone born from [birth year] through 1964. ? Anyone with known risk factors for hepatitis C.  Sexually transmitted infections (STIs)  You should be screened for sexually transmitted infections (STIs) including gonorrhea and chlamydia if: ? You are sexually active and are younger than 75 years of age. ?  You are older than 75 years of age and your health care provider tells you that you are at risk for this type of infection. ? Your sexual activity has changed since you were last screened and you are at an increased risk for chlamydia or gonorrhea. Ask your health care provider if you  are at risk.  If you do not have HIV, but are at risk, it may be recommended that you take a prescription medicine daily to prevent HIV infection. This is called pre-exposure prophylaxis (PrEP). You are considered at risk if: ? You are sexually active and do not regularly use condoms or know the HIV status of your partner(s). ? You take drugs by injection. ? You are sexually active with a partner who has HIV.  Talk with your health care provider about whether you are at high risk of being infected with HIV. If you choose to begin PrEP, you should first be tested for HIV. You should then be tested every 3 months for as long as you are taking PrEP. Pregnancy  If you are premenopausal and you may become pregnant, ask your health care provider about preconception counseling.  If you may become pregnant, take 400 to 800 micrograms (mcg) of folic acid every day.  If you want to prevent pregnancy, talk to your health care provider about birth control (contraception). Osteoporosis and menopause  Osteoporosis is a disease in which the bones lose minerals and strength with aging. This can result in serious bone fractures. Your risk for osteoporosis can be identified using a bone density scan.  If you are 10 years of age or older, or if you are at risk for osteoporosis and fractures, ask your health care provider if you should be screened.  Ask your health care provider whether you should take a calcium or vitamin D supplement to lower your risk for osteoporosis.  Menopause may have certain physical symptoms and risks.  Hormone replacement therapy may reduce some of these symptoms and risks. Talk to your health care provider about whether hormone replacement therapy is right for you. Follow these instructions at home:  Schedule regular health, dental, and eye exams.  Stay current with your immunizations.  Do not use any tobacco products including cigarettes, chewing tobacco, or electronic  cigarettes.  If you are pregnant, do not drink alcohol.  If you are breastfeeding, limit how much and how often you drink alcohol.  Limit alcohol intake to no more than 1 drink per day for nonpregnant women. One drink equals 12 ounces of beer, 5 ounces of wine, or 1 ounces of hard liquor.  Do not use street drugs.  Do not share needles.  Ask your health care provider for help if you need support or information about quitting drugs.  Tell your health care provider if you often feel depressed.  Tell your health care provider if you have ever been abused or do not feel safe at home. This information is not intended to replace advice given to you by your health care provider. Make sure you discuss any questions you have with your health care provider. Document Released: 09/21/2010 Document Revised: 08/14/2015 Document Reviewed: 12/10/2014 Elsevier Interactive Patient Education  [employer].

## 2016-10-25 ENCOUNTER — Telehealth: Payer: Self-pay | Admitting: Gastroenterology

## 2016-10-25 NOTE — Telephone Encounter (Signed)
Doc of the Day This is a patient of Dr Fuller Plan. Very lengthy conversation. Patient states this is an issue that has come and gone for the past 2 to 3 years. Her mouth , lips and tongue are sensitive to foods and drink. It is a burning sensation. She says it is not oral candidus as she has seen that before. This is not that. No peeling or sloughing of the mucosa. Simply a sting or burn. She is certain it is related to Barrett's. She feels something must have been "stirred up." She asks what you think about her symptoms.

## 2016-10-25 NOTE — Telephone Encounter (Signed)
Unclear what is driving this symptom. Her EGD showed stable changes recently, no evidence of esophagitis. No evidence of candidiasis. Not sure if this a reaction to any medication she is taking or something else. She should monitor symptoms. Dr. Fuller Plan can get in touch with her when he returns to the office. thanks

## 2016-10-26 NOTE — Telephone Encounter (Signed)
Patient notified of recommendations. Forwarded note to Dr. Fuller Plan for review upon his return.

## 2016-10-29 ENCOUNTER — Telehealth: Payer: Self-pay | Admitting: Family Medicine

## 2016-10-29 NOTE — Telephone Encounter (Signed)
Pt saw susan on 10-22-16 and would like susan to return her call

## 2016-10-31 NOTE — Telephone Encounter (Signed)
Not sure what is causing her oral symptoms however her PCP or an ENT should further evaluate. This does not appear to be a GI related problem.

## 2016-11-01 ENCOUNTER — Telehealth: Payer: Self-pay | Admitting: Gastroenterology

## 2016-11-01 ENCOUNTER — Other Ambulatory Visit: Payer: Self-pay | Admitting: Family Medicine

## 2016-11-01 NOTE — Telephone Encounter (Signed)
Left message for patient to call back  

## 2016-11-02 NOTE — Telephone Encounter (Signed)
Patient reports that she is having some epigastric pain.  She feels that this is related to her Barrett's esophagus.  I explained that Barrett's is not causing her symptoms.  Patient instructed to maintain an anti-reflux diet. Advised to avoid caffeine, mint, citrus foods/juices, tomatoes,  chocolate, NSAIDS/ASA products.  Instructed not to eat within 2 hours of exercise or bed, multiple small meals are better than 3 large meals.  Need to take PPI 30 minutes prior to 1st meal of the day. She is advised to increase her PPI to BID for 1 week then return to daily.  She is advised to see her ENT for oral burning.

## 2016-11-03 ENCOUNTER — Encounter: Payer: Self-pay | Admitting: Family Medicine

## 2016-11-04 ENCOUNTER — Ambulatory Visit: Payer: Medicare Other | Admitting: Family Medicine

## 2016-11-04 ENCOUNTER — Other Ambulatory Visit: Payer: Self-pay

## 2016-11-04 ENCOUNTER — Ambulatory Visit (INDEPENDENT_AMBULATORY_CARE_PROVIDER_SITE_OTHER): Payer: Medicare Other | Admitting: Family Medicine

## 2016-11-04 ENCOUNTER — Encounter: Payer: Self-pay | Admitting: Family Medicine

## 2016-11-04 VITALS — BP 148/72 | HR 73 | Temp 98.1°F | Ht 64.0 in | Wt 123.0 lb

## 2016-11-04 DIAGNOSIS — K146 Glossodynia: Secondary | ICD-10-CM

## 2016-11-04 NOTE — Progress Notes (Signed)
Subjective:  Lori [name] is a 76 y.o. year old very pleasant female patient who presents for/with See problem oriented charting ROS- no fever, chills. Low appetite due to lip and tongue pain. No vomiting. Some nausea.    Past Medical History-  Patient Active Problem List   Diagnosis Date Noted  . Hypertension 11/29/2013    Priority: Medium  . Hyperlipidemia 11/29/2013    Priority: Medium  . Barrett's esophagus 08/24/2013    Priority: Medium  . Osteoporosis 09/22/2006    Priority: Medium  . Aortic atherosclerosis ([place]) 02/11/2016    Priority: Low  . Eczema 11/29/2013    Priority: Low  . GERD (gastroesophageal reflux disease) 05/16/2013    Priority: Low  . Vitamin D deficiency 01/29/2012    Priority: Low  . Solitary pulmonary nodule 06/08/2011    Priority: Low  . HIATAL HERNIA WITH REFLUX 02/06/2010    Priority: Low  . DEGENERATIVE JOINT DISEASE, KNEE 03/14/2008    Priority: Low  . VARICOSE VEINS LOWER EXTREMITIES W/INFLAMMATION 03/09/2007    Priority: Low  . Allergic rhinitis 03/09/2007    Priority: Low  . ACTINIC KERATOSIS, FOREHEAD, LEFT 03/09/2007    Priority: Low  . Headache(784.0) 03/09/2007    Priority: Low  . MENIERE'S DISEASE 09/22/2006    Priority: Low  . RAYNAUD'S DISEASE 09/22/2006    Priority: Low    Medications- reviewed and updated Current Outpatient Prescriptions  Medication Sig Dispense Refill  . amLODipine (NORVASC) 2.5 MG tablet TAKE 1 TABLET BY MOUTH EVERY DAY 30 tablet 5  . aspirin 81 MG tablet Take 81 mg by mouth daily.      . fenofibrate 160 MG tablet TAKE 1 TABLET BY MOUTH EVERY DAY 90 tablet 2  . loratadine (CLARITIN) 10 MG tablet Take 10 mg by mouth daily. Take 1/2 tablet daily.    . metoprolol tartrate (LOPRESSOR) 50 MG tablet TAKE 1/2 TABLET BY MOUTH 2 TIMES DAILY 90 tablet 1  . montelukast (SINGULAIR) 10 MG tablet Take 1 tablet (10 mg total) by mouth at bedtime. 30 tablet 3  . pantoprazole (PROTONIX) 40 MG tablet TAKE 1 TABLET BY  MOUTH EVERY DAY 30 tablet 3  . Vitamin D, Cholecalciferol, 1000 units TABS Take by mouth.     No current facility-administered medications for this visit.     Objective: BP (!) 148/72 (BP Location: Left Arm, Patient Position: Sitting, Cuff Size: Normal)   Pulse 73   Temp 98.1 F (36.7 C) (Oral)   Ht 5\' 4"  (1.626 m)   Wt 123 lb (55.8 kg)   SpO2 95%   BMI 21.11 kg/m  Gen: NAD, resting comfortably No thrush on tongue. Mucous membranes are moist. Possibly tongue more erythematous than prior and slightly enlarged- but would only be minimal change CV: RRR no murmurs rubs or gallops Lungs: CTAB no crackles, wheeze, rhonchi Abdomen: soft/nontender/nondistended/normal bowel sounds. No rebound or guarding.  Ext: no edema Skin: warm, dry, no rash  Assessment/Plan:  Burning tongue - Plan: Vitamin B12 S: July 19th endoscopy. Felt fine. July 20th evening did instant coffee, honey, milk- had instant burning sensation on tongue, lips and would have bad acid reflux. Lip and tongue burning has been constant since that time except for when she sleeps. States often would pace at night and couldn't fall asleep. Felt intense pain in these areas. Put ice on lips and tongue and helped some.   Has had reflux with eating several times since that time including with something as simple as  [place]. Also has had fair amount belching, epigastric tenderness after meals. [name] water tasts metallic if reflux is bothering her.   GI advised protonix in the day before breakfast- 30 minutes after pill eats. Then they recommended right before bed trial for a week. The pill itself seemed to make the reflux worse so she stopped after 1 day.   Has felt this before with thrush. No b12 supplement, denies history of low b12  No chocolate, no caffeine, water to drink. Already avoids fatty and spicy foods. No peppermint or carbonated beverages. Mainly eating-Plain chicken and broccoli/cauliflower blend A/P: Patient is in distress  about her current burning tongue issue as well as GERD issue. I advised scheduling tylenol. Also advised b12 testing. We have already referred to Dr. Redmond Baseman of ENT earlier today at her request. She describes herself as being "very sensitive" to medications and foods- I suspect her current issues may resolve without intervention unless b12 is a contributing factor. No clear signs of thrush but I would be willing to trial thrush treatment like nyastatin potentially.   S: controlled on home checks as low as in 120s over 70s this Am. Generally elevated in office BP Readings from Last 3 Encounters:  11/04/16 (!) 148/72  10/08/16 (!) 142/82  10/07/16 (!) 142/64  A/P: We discussed blood pressure goal of <140/90. Continue current meds:  Amlodipine 2.5mg  and metoprolol as long as blood pressure remains controlled at home.    Patient Instructions  Please stop by lab before you go- make sure b12 not contributing  I would try 1 tylenol 325 or 500mg  at each meal to see if we can reduce pain level until you see ENT  Does not look like thrush today- I would be willing to send in treatment if that changes   Orders Placed This Encounter  Procedures  . Vitamin B12   Return precautions advised.  Garret Reddish, MD

## 2016-11-04 NOTE — Patient Instructions (Signed)
Please stop by lab before you go- make sure b12 not contributing  I would try 1 tylenol 325 or 500mg  at each meal to see if we can reduce pain level until you see ENT  Does not look like thrush today- I would be willing to send in treatment if that changes

## 2016-11-05 LAB — VITAMIN B12: VITAMIN B 12: 388 pg/mL (ref 211–911)

## 2016-11-11 ENCOUNTER — Other Ambulatory Visit: Payer: Self-pay | Admitting: Family Medicine

## 2016-11-12 ENCOUNTER — Telehealth: Payer: Self-pay

## 2016-11-12 NOTE — Telephone Encounter (Signed)
Call placed to return call States she is feeling a little better but has apt now with dr. Phineas Inches rn

## 2016-11-24 ENCOUNTER — Encounter: Payer: Self-pay | Admitting: Family Medicine

## 2016-11-25 DIAGNOSIS — K146 Glossodynia: Secondary | ICD-10-CM | POA: Diagnosis not present

## 2016-12-13 ENCOUNTER — Other Ambulatory Visit: Payer: Self-pay | Admitting: Family Medicine

## 2016-12-20 ENCOUNTER — Ambulatory Visit (INDEPENDENT_AMBULATORY_CARE_PROVIDER_SITE_OTHER): Payer: Medicare Other | Admitting: Family Medicine

## 2016-12-20 ENCOUNTER — Encounter: Payer: Self-pay | Admitting: Family Medicine

## 2016-12-20 VITALS — BP 118/62 | HR 84 | Temp 98.7°F | Ht 64.0 in | Wt 121.6 lb

## 2016-12-20 DIAGNOSIS — K2270 Barrett's esophagus without dysplasia: Secondary | ICD-10-CM

## 2016-12-20 DIAGNOSIS — J069 Acute upper respiratory infection, unspecified: Secondary | ICD-10-CM | POA: Diagnosis not present

## 2016-12-20 NOTE — Progress Notes (Signed)
PCP: [name], MD  Subjective:  Brittania Sudbeck is a 76 y.o. year old very pleasant female patient who presents with Upper Respiratory infection symptoms including nasal congestion, sore throat, cough.   Symptoms started with what she thought wereAllergies - itchy scratchy ears, runny nose, sneezing about 3.5 days ago  Sore throat started 2 days ago and that has resolved  Pressure in right ear, raspy cough. Clear sputum- occasional yellow twinge.ear better with autoinsufflation. Twinge of pain in chest when coughs.   Has been watching 23 year old granddaughter- has worn her out and may have left her susceptible  Starting to feel better this afternoon. Taking half a claritin in AM- whole bothers her barrett's. Takes Singulair every night.   ROS-denies fever, SOB, NVD, tooth pain  Pertinent Past Medical History-  Patient Active Problem List   Diagnosis Date Noted  . Hypertension 11/29/2013    Priority: Medium  . Hyperlipidemia 11/29/2013    Priority: Medium  . Barrett's esophagus 08/24/2013    Priority: Medium  . Osteoporosis 09/22/2006    Priority: Medium  . Aortic atherosclerosis ([place]) 02/11/2016    Priority: Low  . Eczema 11/29/2013    Priority: Low  . GERD (gastroesophageal reflux disease) 05/16/2013    Priority: Low  . Vitamin D deficiency 01/29/2012    Priority: Low  . Solitary pulmonary nodule 06/08/2011    Priority: Low  . HIATAL HERNIA WITH REFLUX 02/06/2010    Priority: Low  . DEGENERATIVE JOINT DISEASE, KNEE 03/14/2008    Priority: Low  . VARICOSE VEINS LOWER EXTREMITIES W/INFLAMMATION 03/09/2007    Priority: Low  . Allergic rhinitis 03/09/2007    Priority: Low  . ACTINIC KERATOSIS, FOREHEAD, LEFT 03/09/2007    Priority: Low  . Headache(784.0) 03/09/2007    Priority: Low  . MENIERE'S DISEASE 09/22/2006    Priority: Low  . RAYNAUD'S DISEASE 09/22/2006    Priority: Low   Medications- reviewed  Current Outpatient Prescriptions  Medication Sig  Dispense Refill  . amLODipine (NORVASC) 2.5 MG tablet TAKE 1 TABLET BY MOUTH EVERY DAY 30 tablet 5  . aspirin 81 MG tablet Take 81 mg by mouth daily.      . fenofibrate 160 MG tablet TAKE 1 TABLET BY MOUTH EVERY DAY 90 tablet 2  . loratadine (CLARITIN) 10 MG tablet Take 10 mg by mouth daily. Take 1/2 tablet daily.    . metoprolol tartrate (LOPRESSOR) 50 MG tablet TAKE 1/2 TABLET BY MOUTH 2 TIMES DAILY 90 tablet 1  . montelukast (SINGULAIR) 10 MG tablet TAKE 1 TABLET BY MOUTH AT BEDTIME 30 tablet 5  . pantoprazole (PROTONIX) 40 MG tablet TAKE 1 TABLET BY MOUTH EVERY DAY 30 tablet 5  . Vitamin D, Cholecalciferol, 1000 units TABS Take by mouth.     No current facility-administered medications for this visit.     Objective: BP 118/62 (BP Location: Right Arm, Patient Position: Sitting, Cuff Size: Normal)   Pulse 84   Temp 98.7 F (37.1 C) (Oral)   Ht 5\' 4"  (1.626 m)   Wt 121 lb 9.6 oz (55.2 kg)   SpO2 96%   BMI 20.87 kg/m  Gen: NAD, resting comfortably HEENT: Turbinates erythematous with some clear drainage noted, TM normal, pharynx mildly erythematous with no tonsilar exudate or edema, no sinus tenderness CV: RRR no murmurs rubs or gallops Lungs: CTAB no crackles, wheeze, rhonchi Ext: no edema  Assessment/Plan:  Upper Respiratory infection History and exam today are suggestive of viral infection most likely  due to upper respiratory infection. Symptomatic treatment with: rest, hydration. She declines all medications other than her half a claritin with concern they will affect her Barrett's  We discussed that we did not find any infection that had higher probability of being bacterial such as pneumonia or strep throat. We discussed signs that bacterial infection may have developed particularly fever or shortness of breath. Likely course of 1-2 weeks. Patient is contagious and advised good handwashing and consideration of mask If going to be in public places.   Finally, we reviewed  reasons to return to care including if symptoms worsen or persist or new concerns arise- once again particularly shortness of breath or fever.  The duration of face-to-face time during this visit was greater than 15 minutes. Greater than 50% of this time was spent in counseling, explanation of diagnosis, planning of further management, and/or coordination of care including - we also in addition to URI and stress of caring for granddaughter discussed her Barrett's- including regimen's she has found at home that have been helpful as well as discussing treatment for burning tongue through ENT which has been very effective. Garret Reddish, MD

## 2016-12-20 NOTE — Patient Instructions (Signed)
Upper respiratory infections last anywhere from 5-14 days usually  Rest, hydration, stress reductions are your best friends in this time frame  Look forward to seeing you on wednesday

## 2016-12-22 ENCOUNTER — Encounter: Payer: Self-pay | Admitting: Family Medicine

## 2016-12-22 ENCOUNTER — Ambulatory Visit (INDEPENDENT_AMBULATORY_CARE_PROVIDER_SITE_OTHER): Payer: Medicare Other | Admitting: Family Medicine

## 2016-12-22 VITALS — BP 132/70 | HR 71 | Temp 98.3°F | Ht 64.5 in | Wt 120.8 lb

## 2016-12-22 DIAGNOSIS — I700 Atherosclerosis of aorta: Secondary | ICD-10-CM

## 2016-12-22 DIAGNOSIS — K2270 Barrett's esophagus without dysplasia: Secondary | ICD-10-CM

## 2016-12-22 DIAGNOSIS — I10 Essential (primary) hypertension: Secondary | ICD-10-CM

## 2016-12-22 DIAGNOSIS — M816 Localized osteoporosis [Lequesne]: Secondary | ICD-10-CM

## 2016-12-22 DIAGNOSIS — Z0000 Encounter for general adult medical examination without abnormal findings: Secondary | ICD-10-CM

## 2016-12-22 DIAGNOSIS — E785 Hyperlipidemia, unspecified: Secondary | ICD-10-CM | POA: Diagnosis not present

## 2016-12-22 NOTE — Assessment & Plan Note (Signed)
S: LDL controlled on last check 01/1716 at 75- drastic response to fenofibrate- she has firmly declined statins . Discussed aortic atherosclerosis. No myalgias.  Lab Results  Component Value Date   CHOL 149 02/06/2016   HDL 69.90 02/06/2016   LDLCALC 161 (H) 12/05/2015   LDLDIRECT 74.0 02/06/2016   TRIG 62.0 12/05/2015   CHOLHDL 3 12/05/2015   A/P: continue current meds, update lipids

## 2016-12-22 NOTE — Assessment & Plan Note (Signed)
Barrett's esophagus- Compliant with protonix. Follows up with GI for endoscopies every 3 years.

## 2016-12-22 NOTE — Assessment & Plan Note (Signed)
Osteoporosis prior diagnosis- last bone density actually improved into osteopenia range. Advised 1000 units vitamin D a day at least - she is on 2000 units. 07/2016 with planned repeat 07/2018 or later.

## 2016-12-22 NOTE — Patient Instructions (Addendum)
Schedule a lab visit at the check out desk within 2 weeks. Return for future fasting labs meaning nothing but water after midnight please. Ok to take your medications with water.   No change in medication  Glad you are improving  Get your flu shot when you are better  [facility] you have a safe cold and flu season

## 2016-12-22 NOTE — Assessment & Plan Note (Signed)
HTN- controlled on metoprolol 25mg  BID and amlodipine 2.5mg . HOme checks 118/70s BP Readings from Last 3 Encounters:  12/22/16 132/70  12/20/16 118/62  11/04/16 (!) 148/72

## 2016-12-22 NOTE — Progress Notes (Signed)
Phone: [phone]  Subjective:  Patient presents today for their annual physical. Chief complaint-noted.   See problem oriented charting- ROS- full  review of systems was completed and negative except for: acid reflux symptoms if not really careful with diet. Otherwise no chest pain or shortness o fbreath.   The following were reviewed and entered/updated in epic: Past Medical History:  Diagnosis Date  . Allergy   . Anemia    past hx of anemia  . Aneurysm ([place])    pseudo-aneurym of carotid arteries per pt  . Arthritis    knee  . Barrett's esophagus   . Cataract    bilateral   . 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 Eczema   . GERD (gastroesophageal reflux disease)   . Headache(784.0)   . History of IBS   . Hyperlipidemia    on medication  . Hypertension   . Low back pain   . Meniere disease   . 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 Neuromuscular disorder ([facility])    Hiatal Hernia, Raynauds disease  . Osteopenia   . 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[facility])   . 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 Tubular adenoma of colon 02/2013  . Varicose veins with inflammation   . Vasculitis [facility])    Patient Active Problem List   Diagnosis Date Noted  . Hypertension 11/29/2013    Priority: Medium  . Hyperlipidemia 11/29/2013    Priority: Medium  . Barrett's esophagus 08/24/2013    Priority: Medium  . Osteoporosis 09/22/2006    Priority: Medium  . Aortic atherosclerosis ([place]) 02/11/2016    Priority: Low  . Eczema 11/29/2013    Priority: Low  . GERD (gastroesophageal reflux disease) 05/16/2013    Priority: Low  . Vitamin D deficiency 01/29/2012    Priority: Low  . Solitary pulmonary nodule 06/08/2011    Priority: Low  . HIATAL HERNIA WITH REFLUX 02/06/2010    Priority: Low  . DEGENERATIVE JOINT DISEASE, KNEE 03/14/2008    Priority: Low  . VARICOSE VEINS LOWER EXTREMITIES W/INFLAMMATION 03/09/2007   Priority: Low  . Allergic rhinitis 03/09/2007    Priority: Low  . ACTINIC KERATOSIS, FOREHEAD, LEFT 03/09/2007    Priority: Low  . Headache(784.0) 03/09/2007    Priority: Low  . MENIERE'S DISEASE 09/22/2006    Priority: Low  . RAYNAUD'S DISEASE 09/22/2006    Priority: Low   Past Surgical History:  Procedure Laterality Date  . arthroscopic knee     left knee/ torn meniscus  . BUNIONECTOMY Right 1998   with other foot surgery   . carotid artery disection  1988   Carotid Artery Dissection  . CESAREAN SECTION     1 time  . COLONOSCOPY    . DILATION AND CURETTAGE OF UTERUS    . POLYPECTOMY    . POPLITEAL SYNOVIAL CYST EXCISION     left leg  . TONSILLECTOMY      Family History  Problem Relation Age of Onset  . Multiple myeloma Father   . Kidney disease Brother        cancer- removed  . Prostate cancer Brother   . Thyroid cancer Sister        s/p removal. papilary and anaplastic.   . Lung cancer Sister   . Alzheimer's disease Brother        older broterh  . Stroke Brother        88-older  brother  . Colon cancer Neg Hx   . Esophageal cancer Neg Hx   . Rectal cancer Neg Hx   . Stomach cancer Neg Hx   . Colon polyps Neg Hx     Medications- reviewed and updated Current Outpatient Prescriptions  Medication Sig Dispense Refill  . amLODipine (NORVASC) 2.5 MG tablet TAKE 1 TABLET BY MOUTH EVERY DAY 30 tablet 5  . aspirin 81 MG tablet Take 81 mg by mouth daily.      . fenofibrate 160 MG tablet TAKE 1 TABLET BY MOUTH EVERY DAY 90 tablet 2  . loratadine (CLARITIN) 10 MG tablet Take 10 mg by mouth daily. Take 1/2 tablet daily.    . metoprolol tartrate (LOPRESSOR) 50 MG tablet TAKE 1/2 TABLET BY MOUTH 2 TIMES DAILY 90 tablet 1  . montelukast (SINGULAIR) 10 MG tablet TAKE 1 TABLET BY MOUTH AT BEDTIME 30 tablet 5  . pantoprazole (PROTONIX) 40 MG tablet TAKE 1 TABLET BY MOUTH EVERY DAY 30 tablet 5  . Vitamin D, Cholecalciferol, 1000 units TABS Take by mouth.     No current  facility-administered medications for this visit.     Allergies-reviewed and updated Allergies  Allergen Reactions  . Hydrocodone-Acetaminophen     VOMITING  . Azithromycin Other (See Comments)    Thrush   . Neomycin   . Penicillins     REACTION: Arm swelling  . Pneumococcal Vaccine Polyvalent     ARM REDNESS WITH TENDERNESS  . Pneumovax [Pneumococcal Polysaccharide Vaccine]     Social History   Social History  . Marital status: Married    Spouse name: N/A  . Number of children: N/A  . Years of education: N/A   Occupational History  . retired    Social History Main Topics  . Smoking status: Never Smoker  . Smokeless tobacco: Never Used  . Alcohol use No  . Drug use: No  . Sexual activity: Yes   Other Topics Concern  . None   Social History Narrative  . None    Objective: BP 132/70 (BP Location: Right Arm, Patient Position: Sitting, Cuff Size: Normal)   Pulse 71   Temp 98.3 F (36.8 C) (Oral)   Ht 5' 4.5" (1.638 m)   Wt 120 lb 12.8 oz (54.8 kg)   SpO2 97%   BMI 20.42 kg/m  Gen: NAD, resting comfortably HEENT: Mucous membranes are moist. Oropharynx normal Neck: no thyromegaly CV: RRR no murmurs rubs or gallops Lungs: CTAB no crackles, wheeze, rhonchi Abdomen: soft/nontender/nondistended/normal bowel sounds. No rebound or guarding.  Ext: no edema Skin: warm, dry Neuro: grossly normal, moves all extremities, PERRLA  Assessment/Plan:  76 y.o. female presenting for annual physical.  Health Maintenance counseling: 1. Anticipatory guidance: Patient counseled regarding regular dental exams q6 months, eye exams yearly- monitoring cataracts, wearing seatbelts.  2. Risk factor reduction:  Advised patient of need for regular exercise and diet rich and fruits and vegetables to reduce risk of heart attack and stroke. Exercise- walking at [place]- every other day. Diet-reasonable- tough due to barretts. .  Wt Readings from Last 3 Encounters:  12/22/16  120 lb 12.8 oz (54.8 kg)  12/20/16 121 lb 9.6 oz (55.2 kg)  11/04/16 123 lb (55.8 kg)  3. Immunizations/screenings/ancillary studies- flu shot when feeling better from current URI. Discuss shingrix next year when availability better. Prevnar 13 next year potentially- if not ill- prior bad reaction local to pneumovax 23 Immunization History  Administered Date(s) Administered  . Influenza Split 01/04/2012  .  Influenza Whole 01/03/2007, 12/08/2007, 12/17/2008, 01/20/2010  . Influenza, High Dose Seasonal PF 01/10/2015, 12/12/2015  . Influenza,inj,Quad PF,6+ Mos 01/05/2013, 12/24/2013  . Pneumococcal Polysaccharide-23 02/12/2004  . Td 03/23/2003, 04/04/2013  4. Cervical cancer screening- passed age based screening recommendations. Never had abnormal pap smear 5. Breast cancer screening-  breast exam declines and mammogram 06/09/16 6. Colon cancer screening - 03/03/13 with 5 year follow up 7. Skin cancer screening- [facility] yearly for most part  Status of chronic or acute concerns   Last year did pulmonary nodule follow up from 2013 - stable so no further follow up.   URI- phlegm clear this AM  Hypertension HTN- controlled on metoprolol 80m BID and amlodipine 2.580m HOme checks 118/70s BP Readings from Last 3 Encounters:  12/22/16 132/70  12/20/16 118/62  11/04/16 (!) 148/72    Osteoporosis Osteoporosis prior diagnosis- last bone density actually improved into osteopenia range. Advised 1000 units vitamin D a day at least - she is on 2000 units. 07/2016 with planned repeat 07/2018 or later.   Hyperlipidemia S: LDL controlled on last check 01/1716 at 75- drastic response to fenofibrate- she has firmly declined statins . Discussed aortic atherosclerosis. No myalgias.  Lab Results  Component Value Date   CHOL 149 02/06/2016   HDL 69.90 02/06/2016   LDLCALC 161 (H) 12/05/2015   LDLDIRECT 74.0 02/06/2016   TRIG 62.0 12/05/2015   CHOLHDL 3 12/05/2015   A/P: continue current meds,  update lipids  Barrett's esophagus Barrett's esophagus- Compliant with protonix. Follows up with GI for endoscopies every 3 years.  Future Appointments Date Time Provider [place]8/07/2017 2:00 PM AlStephanie AcreRN [facility] None   Return in about 6 months (around 06/22/2017) for follow up- or sooner if needed.  Orders Placed This Encounter  Procedures  . CBC    Standing Status:   Future    Standing Expiration Date:   12/22/2017  . Comprehensive metabolic panel    [place]    Standing Status:   Future    Standing Expiration Date:   12/22/2017  . Lipid panel    Standing Status:   Future    Standing Expiration Date:   12/22/2017  . Urinalysis    Standing Status:   Future    Standing Expiration Date:   12/22/2017   Return precautions advised.  StGarret ReddishMD

## 2016-12-29 ENCOUNTER — Ambulatory Visit (INDEPENDENT_AMBULATORY_CARE_PROVIDER_SITE_OTHER): Payer: Medicare Other

## 2016-12-29 ENCOUNTER — Other Ambulatory Visit (INDEPENDENT_AMBULATORY_CARE_PROVIDER_SITE_OTHER): Payer: Medicare Other

## 2016-12-29 DIAGNOSIS — Z0000 Encounter for general adult medical examination without abnormal findings: Secondary | ICD-10-CM | POA: Diagnosis not present

## 2016-12-29 DIAGNOSIS — I10 Essential (primary) hypertension: Secondary | ICD-10-CM

## 2016-12-29 DIAGNOSIS — Z23 Encounter for immunization: Secondary | ICD-10-CM | POA: Diagnosis not present

## 2016-12-29 DIAGNOSIS — E785 Hyperlipidemia, unspecified: Secondary | ICD-10-CM

## 2016-12-29 LAB — LIPID PANEL
CHOL/HDL RATIO: 2
CHOLESTEROL: 140 mg/dL (ref 0–200)
HDL: 56.9 mg/dL (ref >39.00)
LDL CALC: 76 mg/dL (ref 0–99)
NonHDL: 83.43
TRIGLYCERIDES: 39 mg/dL (ref 0.0–149.0)
VLDL: 7.8 mg/dL (ref 0.0–40.0)

## 2016-12-29 LAB — COMPREHENSIVE METABOLIC PANEL
ALT: 13 U/L (ref 0–35)
AST: 17 U/L (ref 0–37)
Albumin: 4.2 g/dL (ref 3.5–5.2)
Alkaline Phosphatase: 47 U/L (ref 39–117)
BUN: 27 mg/dL — ABNORMAL HIGH (ref 6–23)
CALCIUM: 9.7 mg/dL (ref 8.4–10.5)
CHLORIDE: 103 meq/L (ref 96–112)
CO2: 28 meq/L (ref 19–32)
CREATININE: 0.69 mg/dL (ref 0.40–1.20)
GFR: 88.2 mL/min (ref >60.00)
Glucose, Bld: 76 mg/dL (ref 70–99)
POTASSIUM: 3.8 meq/L (ref 3.5–5.1)
SODIUM: 139 meq/L (ref 135–145)
Total Bilirubin: 0.4 mg/dL (ref 0.2–1.2)
Total Protein: 7.7 g/dL (ref 6.0–8.3)

## 2016-12-29 LAB — URINALYSIS, ROUTINE W REFLEX MICROSCOPIC
BILIRUBIN URINE: NEGATIVE
Ketones, ur: NEGATIVE
Leukocytes, UA: NEGATIVE
Nitrite: NEGATIVE
PH: 7 (ref 5.0–8.0)
SPECIFIC GRAVITY, URINE: 1.01 (ref 1.000–1.030)
TOTAL PROTEIN, URINE-UPE24: NEGATIVE
UROBILINOGEN UA: 0.2 (ref 0.0–1.0)
Urine Glucose: NEGATIVE
WBC UA: NONE SEEN (ref 0–?)

## 2016-12-29 LAB — CBC
HEMATOCRIT: 38 % (ref 36.0–46.0)
Hemoglobin: 12.3 g/dL (ref 12.0–15.0)
MCHC: 32.4 g/dL (ref 30.0–36.0)
MCV: 86.9 fl (ref 78.0–100.0)
PLATELETS: 344 10*3/uL (ref 150.0–400.0)
RBC: 4.37 Mil/uL (ref 3.87–5.11)
RDW: 13.9 % (ref 11.5–15.5)
WBC: 5.5 10*3/uL (ref 4.0–10.5)

## 2016-12-30 ENCOUNTER — Other Ambulatory Visit: Payer: Self-pay

## 2016-12-30 DIAGNOSIS — R3129 Other microscopic hematuria: Secondary | ICD-10-CM

## 2017-01-05 ENCOUNTER — Other Ambulatory Visit (INDEPENDENT_AMBULATORY_CARE_PROVIDER_SITE_OTHER): Payer: Medicare Other

## 2017-01-05 DIAGNOSIS — R3129 Other microscopic hematuria: Secondary | ICD-10-CM | POA: Diagnosis not present

## 2017-01-05 LAB — URINALYSIS, MICROSCOPIC ONLY
RBC / HPF: NONE SEEN (ref 0–?)
WBC, UA: NONE SEEN (ref 0–?)

## 2017-01-21 ENCOUNTER — Encounter: Payer: Self-pay | Admitting: Family Medicine

## 2017-01-24 ENCOUNTER — Ambulatory Visit (INDEPENDENT_AMBULATORY_CARE_PROVIDER_SITE_OTHER): Payer: Medicare Other | Admitting: Family Medicine

## 2017-01-24 ENCOUNTER — Encounter: Payer: Self-pay | Admitting: Family Medicine

## 2017-01-24 VITALS — BP 122/70 | HR 81 | Temp 97.5°F | Ht 64.5 in | Wt 122.8 lb

## 2017-01-24 DIAGNOSIS — J301 Allergic rhinitis due to pollen: Secondary | ICD-10-CM | POA: Diagnosis not present

## 2017-01-24 DIAGNOSIS — M25542 Pain in joints of left hand: Secondary | ICD-10-CM | POA: Diagnosis not present

## 2017-01-24 DIAGNOSIS — M25541 Pain in joints of right hand: Secondary | ICD-10-CM | POA: Diagnosis not present

## 2017-01-24 NOTE — Patient Instructions (Signed)
First things needs to increase sleep to 8 hours a day  Also trial claritin at night instead of in the morning  Can do ESR or CRP if not better in 2-3 weeks but strongly doubt PMR

## 2017-01-24 NOTE — Progress Notes (Signed)
Subjective:  Lori [name] is a 76 y.o. year old very pleasant female patient who presents for/with See problem oriented charting ROS- no fever or chills. Has some hot flashes with episodes. No chest pain or shortness of breath.    Past Medical History-  Patient Active Problem List   Diagnosis Date Noted  . Hypertension 11/29/2013    Priority: Medium  . Hyperlipidemia 11/29/2013    Priority: Medium  . Barrett's esophagus 08/24/2013    Priority: Medium  . Osteoporosis 09/22/2006    Priority: Medium  . Aortic atherosclerosis ([place]) 02/11/2016    Priority: Low  . Eczema 11/29/2013    Priority: Low  . GERD (gastroesophageal reflux disease) 05/16/2013    Priority: Low  . Vitamin D deficiency 01/29/2012    Priority: Low  . Solitary pulmonary nodule 06/08/2011    Priority: Low  . HIATAL HERNIA WITH REFLUX 02/06/2010    Priority: Low  . DEGENERATIVE JOINT DISEASE, KNEE 03/14/2008    Priority: Low  . VARICOSE VEINS LOWER EXTREMITIES W/INFLAMMATION 03/09/2007    Priority: Low  . Allergic rhinitis 03/09/2007    Priority: Low  . ACTINIC KERATOSIS, FOREHEAD, LEFT 03/09/2007    Priority: Low  . Headache(784.0) 03/09/2007    Priority: Low  . MENIERE'S DISEASE 09/22/2006    Priority: Low  . RAYNAUD'S DISEASE 09/22/2006    Priority: Low    Medications- reviewed and updated Current Outpatient Medications  Medication Sig Dispense Refill  . amLODipine (NORVASC) 2.5 MG tablet TAKE 1 TABLET BY MOUTH EVERY DAY 30 tablet 5  . aspirin 81 MG tablet Take 81 mg by mouth daily.      . fenofibrate 160 MG tablet TAKE 1 TABLET BY MOUTH EVERY DAY 90 tablet 2  . loratadine (CLARITIN) 10 MG tablet Take 10 mg by mouth daily. Take 1/2 tablet daily.    . metoprolol tartrate (LOPRESSOR) 50 MG tablet TAKE 1/2 TABLET BY MOUTH 2 TIMES DAILY 90 tablet 1  . montelukast (SINGULAIR) 10 MG tablet TAKE 1 TABLET BY MOUTH AT BEDTIME 30 tablet 5  . pantoprazole (PROTONIX) 40 MG tablet TAKE 1 TABLET BY MOUTH  EVERY DAY 30 tablet 5  . Vitamin D, Cholecalciferol, 1000 units TABS Take by mouth.     No current facility-administered medications for this visit.     Objective: BP 122/70 (BP Location: Left Arm, Patient Position: Sitting, Cuff Size: Normal)   Pulse 81   Temp (!) 97.5 F (36.4 C) (Oral)   Ht 5' 4.5" (1.638 m)   Wt 122 lb 12.8 oz (55.7 kg)   SpO2 96%   BMI 20.75 kg/m  Gen: NAD, resting comfortably CV: RRR no murmurs rubs or gallops Lungs: CTAB no crackles, wheeze, rhonchi Ext: no edema Skin: warm, dry MSK: some medial joint line tenderness left knee, no pain with palpation of hands or shoulders- good range of motion Neuro: grossly normal, moves all extremities  Assessment/Plan:  Arthralgia of both hands, left knee, slight in shoulder/hot flashes each AM Seasonal allergic rhinitis due to pollen S: symptoms started around 16th of October. Wakes up in the morning and usually about an hour after she gets up she starts aching. Hands tend to ache, left knee bothers her. Takes full claritin now instead of half- does this around 11 after symptoms have resolved. Takes singulair at night. Some of the days where had a lot of rain didn't feel so bad. 1 tylenol does not help pain. Feels slightly stiff with it. Has checked temperature- normal  during these episodes. No unintentional weight loss- no night sweats. Sleep patterns have been off- staying up until 1 at times and still getting up at 6 or 7. Watching [employer]-good shows coming on staying up late. Some slight shoulder discomfort. Mainly DIP joints hurt.   Does have fair amount of stress- sister dying of stage IV lung cancer- comfort radiation as trouble swallowing and breathing.  A/P: 76 year old with about an hour each morning of arthralgias, hot flashes, fatigue that starts an hour after getting up. Better on days with more rain. Unclear cause. She asks about allergies causing it- don't strongly suspect but asked her to try  claritin at night so will have higher amount in system at time of symptoms. Also she has been under stress and only sleeping 5 hours some nights so encouraged better nights rest- suspect this is more likely culprit.   This does not sound like typical PMR (does have history) but we will get ESR or CRP if not improving or worsening. She also may try turmeric per her request in a few weeks if not better.    Future Appointments  Date Time Provider [place]  06/22/2017  9:00 AM [name], MD [facility] None  10/24/2017  2:00 PM Williemae Area, RN [facility] None   Return precautions advised.  Garret Reddish, MD

## 2017-02-04 ENCOUNTER — Encounter: Payer: Self-pay | Admitting: Family Medicine

## 2017-03-22 HISTORY — PX: COLONOSCOPY: SHX174

## 2017-03-30 DIAGNOSIS — R430 Anosmia: Secondary | ICD-10-CM | POA: Diagnosis not present

## 2017-03-30 DIAGNOSIS — R600 Localized edema: Secondary | ICD-10-CM | POA: Diagnosis not present

## 2017-04-27 ENCOUNTER — Other Ambulatory Visit: Payer: Self-pay | Admitting: Family Medicine

## 2017-05-12 ENCOUNTER — Other Ambulatory Visit: Payer: Self-pay | Admitting: Family Medicine

## 2017-05-13 ENCOUNTER — Other Ambulatory Visit: Payer: Self-pay | Admitting: Family Medicine

## 2017-05-24 DIAGNOSIS — H02839 Dermatochalasis of unspecified eye, unspecified eyelid: Secondary | ICD-10-CM | POA: Diagnosis not present

## 2017-05-24 DIAGNOSIS — H2513 Age-related nuclear cataract, bilateral: Secondary | ICD-10-CM | POA: Diagnosis not present

## 2017-05-24 DIAGNOSIS — H2511 Age-related nuclear cataract, right eye: Secondary | ICD-10-CM | POA: Diagnosis not present

## 2017-05-24 DIAGNOSIS — H25013 Cortical age-related cataract, bilateral: Secondary | ICD-10-CM | POA: Diagnosis not present

## 2017-05-24 DIAGNOSIS — H25043 Posterior subcapsular polar age-related cataract, bilateral: Secondary | ICD-10-CM | POA: Diagnosis not present

## 2017-06-10 ENCOUNTER — Other Ambulatory Visit: Payer: Self-pay | Admitting: Family Medicine

## 2017-06-10 DIAGNOSIS — Z1231 Encounter for screening mammogram for malignant neoplasm of breast: Secondary | ICD-10-CM | POA: Diagnosis not present

## 2017-06-10 LAB — HM MAMMOGRAPHY

## 2017-06-22 ENCOUNTER — Encounter: Payer: Self-pay | Admitting: Family Medicine

## 2017-06-22 ENCOUNTER — Ambulatory Visit (INDEPENDENT_AMBULATORY_CARE_PROVIDER_SITE_OTHER): Payer: Medicare Other | Admitting: Family Medicine

## 2017-06-22 VITALS — BP 122/70 | HR 78 | Temp 97.3°F | Ht 64.5 in | Wt 126.0 lb

## 2017-06-22 DIAGNOSIS — G902 Horner's syndrome: Secondary | ICD-10-CM | POA: Diagnosis not present

## 2017-06-22 DIAGNOSIS — I10 Essential (primary) hypertension: Secondary | ICD-10-CM | POA: Diagnosis not present

## 2017-06-22 DIAGNOSIS — E785 Hyperlipidemia, unspecified: Secondary | ICD-10-CM

## 2017-06-22 DIAGNOSIS — K2270 Barrett's esophagus without dysplasia: Secondary | ICD-10-CM

## 2017-06-22 DIAGNOSIS — I700 Atherosclerosis of aorta: Secondary | ICD-10-CM | POA: Diagnosis not present

## 2017-06-22 NOTE — Patient Instructions (Addendum)
No changes today  Come fasting to next visit- hold off on labs todya

## 2017-06-22 NOTE — Assessment & Plan Note (Signed)
S: controlled on amlodipine 2.5 mg, metoprolol 25mg  BID BP Readings from Last 3 Encounters:  06/22/17 122/70  01/24/17 122/70  12/22/16 132/70  A/P: blood pressure goal of <140/90 though could go to 150 SBP. Continue current meds

## 2017-06-22 NOTE — Assessment & Plan Note (Signed)
S: well controlled on fenofibrate. She has had a strong preference to avoid statins Lab Results  Component Value Date   CHOL 140 12/29/2016   HDL 56.90 12/29/2016   LDLCALC 76 12/29/2016   LDLDIRECT 74.0 02/06/2016   TRIG 39.0 12/29/2016   CHOLHDL 2 12/29/2016   A/P: continue current rx. Discussed could stop aspirin for primary prevention - she would prefer to stay on

## 2017-06-22 NOTE — Assessment & Plan Note (Signed)
Risk factor modification as treatment.

## 2017-06-22 NOTE — Progress Notes (Signed)
Subjective:  Lori [name] is a 77 y.o. year old very pleasant female patient who presents for/with See problem oriented charting ROS- blurry vision with cataracts at least in periphery, some recent eye pain for a few days after eye exam then resolved- history of this with horners syndrome. No chest pain or shortness of breath. No edema.    Past Medical History-  Patient Active Problem List   Diagnosis Date Noted  . 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 Hypertension 11/29/2013    Priority: Medium  . Hyperlipidemia 11/29/2013    Priority: Medium  . Barrett's esophagus 08/24/2013    Priority: Medium  . Osteoporosis 09/22/2006    Priority: Medium  . Aortic atherosclerosis ([place]) 02/11/2016    Priority: Low  . Eczema 11/29/2013    Priority: Low  . GERD (gastroesophageal reflux disease) 05/16/2013    Priority: Low  . Vitamin D deficiency 01/29/2012    Priority: Low  . Solitary pulmonary nodule 06/08/2011    Priority: Low  . HIATAL HERNIA WITH REFLUX 02/06/2010    Priority: Low  . DEGENERATIVE JOINT DISEASE, KNEE 03/14/2008    Priority: Low  . VARICOSE VEINS LOWER EXTREMITIES W/INFLAMMATION 03/09/2007    Priority: Low  . Allergic rhinitis 03/09/2007    Priority: Low  . ACTINIC KERATOSIS, FOREHEAD, LEFT 03/09/2007    Priority: Low  . Headache(784.0) 03/09/2007    Priority: Low  . MENIERE'S DISEASE 09/22/2006    Priority: Low  . RAYNAUD'S DISEASE 09/22/2006    Priority: Low    Medications- reviewed and updated Current Outpatient Medications  Medication Sig Dispense Refill  . amLODipine (NORVASC) 2.5 MG tablet TAKE 1 TABLET BY MOUTH EVERY DAY 30 tablet 5  . aspirin 81 MG tablet Take 81 mg by mouth daily.      . fenofibrate 160 MG tablet TAKE 1 TABLET BY MOUTH EVERY DAY 90 tablet 2  . loratadine (CLARITIN) 10 MG tablet Take 10 mg by mouth daily. Take 1/2 tablet daily.    . metoprolol tartrate (LOPRESSOR) 50 MG tablet TAKE 1/2 TABLET BY MOUTH 2 TIMES DAILY  90 tablet 1  . montelukast (SINGULAIR) 10 MG tablet TAKE 1 TABLET BY MOUTH AT BEDTIME 30 tablet 5  . pantoprazole (PROTONIX) 40 MG tablet TAKE 1 TABLET BY MOUTH EVERY DAY 30 tablet 5  . Vitamin D, Cholecalciferol, 1000 units TABS Take by mouth.     Objective: BP 122/70 (BP Location: Left Arm, Patient Position: Sitting, Cuff Size: Normal)   Pulse 78   Temp (!) 97.3 F (36.3 C) (Oral)   Ht 5' 4.5" (1.638 m)   Wt 126 lb (57.2 kg)   SpO2 98%   BMI 21.29 kg/m  Gen: NAD, resting comfortably CV: RRR no murmurs rubs or gallops Lungs: CTAB no crackles, wheeze, rhonchi Abdomen: soft/nontender/nondistended/normal bowel sounds. Healthy weight Ext: no edema Skin: warm, dry  Assessment/Plan:  Other notes 1. Cataract surgery with Dr. Tommy Rainwater upcoming . R eye is worst and will start there April 29th and then left eye may 20th.   Hyperlipidemia S: well controlled on fenofibrate. She has had a strong preference to avoid statins Lab Results  Component Value Date   CHOL 140 12/29/2016   HDL 56.90 12/29/2016   LDLCALC 76 12/29/2016   LDLDIRECT 74.0 02/06/2016   TRIG 39.0 12/29/2016   CHOLHDL 2 12/29/2016   A/P: continue current rx. Discussed could stop aspirin for primary prevention - she would prefer to stay on   Hypertension  S: controlled on amlodipine 2.5 mg, metoprolol 25mg  BID BP Readings from Last 3 Encounters:  06/22/17 122/70  01/24/17 122/70  12/22/16 132/70  A/P: blood pressure goal of <140/90 though could go to 150 SBP. Continue current meds  Barrett's esophagus S:  monitored by [facility]. She is on protonix 40mg  daily. Has been followed by Dr. Fuller Plan in past as well as [facility] A/P: continue current rx  Aortic atherosclerosis ([place]) Risk factor modification as treatment.   Future Appointments  Date Time Provider [place]  10/24/2017  2:00 PM Williemae Area, RN [facility]   Return in about 6 months (around 12/22/2017) for physical.  Return precautions advised.   Garret Reddish, MD

## 2017-06-22 NOTE — Assessment & Plan Note (Signed)
S:  monitored by [facility]. She is on protonix 40mg  daily. Has been followed by Dr. Fuller Plan in past as well as [facility] A/P: continue current rx

## 2017-07-11 ENCOUNTER — Ambulatory Visit (INDEPENDENT_AMBULATORY_CARE_PROVIDER_SITE_OTHER): Payer: Medicare Other | Admitting: Family Medicine

## 2017-07-11 ENCOUNTER — Encounter: Payer: Self-pay | Admitting: Family Medicine

## 2017-07-11 VITALS — BP 126/78 | HR 72 | Temp 97.9°F | Ht 64.5 in | Wt 125.6 lb

## 2017-07-11 DIAGNOSIS — R5383 Other fatigue: Secondary | ICD-10-CM | POA: Diagnosis not present

## 2017-07-11 DIAGNOSIS — I10 Essential (primary) hypertension: Secondary | ICD-10-CM

## 2017-07-11 DIAGNOSIS — M25511 Pain in right shoulder: Secondary | ICD-10-CM | POA: Diagnosis not present

## 2017-07-11 DIAGNOSIS — M25512 Pain in left shoulder: Secondary | ICD-10-CM

## 2017-07-11 DIAGNOSIS — J301 Allergic rhinitis due to pollen: Secondary | ICD-10-CM

## 2017-07-11 DIAGNOSIS — G902 Horner's syndrome: Secondary | ICD-10-CM

## 2017-07-11 NOTE — Assessment & Plan Note (Signed)
Allergies possibly causing fatigue and achiness but need to investigate other causes.  Continue Singulair and Claritin

## 2017-07-11 NOTE — Assessment & Plan Note (Signed)
Blood pressure looks good today.  Continue Metoprolol 25 mg BID, amlodipine 2.5mg  for now.  If hot flashes/flushing feeling continues could consider holding amlodipine which can contribute to flushing

## 2017-07-11 NOTE — Assessment & Plan Note (Signed)
She brings in documentation from [place]. Noted to have Fibromuscular dysplasia- had TIA postpartum. Letter from Dr. Alden Hipp of neurology states "transcranial and neck doppler and ultrasound studies are completely within normal limits and from our point of view your vessels have healed" from 04/29/1987.

## 2017-07-11 NOTE — Progress Notes (Signed)
Subjective:  Lori [name] is a 77 y.o. year old very pleasant female patient who presents for/with See problem oriented charting ROS-no chest pain or shortness of breath.  No headaches or blurry vision reported- other than issues with cataracts.  No fever or chills.  No night sweats.  Past Medical History-  Patient Active Problem List   Diagnosis Date Noted  . 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 Hypertension 11/29/2013    Priority: Medium  . Hyperlipidemia 11/29/2013    Priority: Medium  . Barrett's esophagus 08/24/2013    Priority: Medium  . Osteoporosis 09/22/2006    Priority: Medium  . Aortic atherosclerosis ([place]) 02/11/2016    Priority: Low  . Eczema 11/29/2013    Priority: Low  . GERD (gastroesophageal reflux disease) 05/16/2013    Priority: Low  . Vitamin D deficiency 01/29/2012    Priority: Low  . Solitary pulmonary nodule 06/08/2011    Priority: Low  . HIATAL HERNIA WITH REFLUX 02/06/2010    Priority: Low  . DEGENERATIVE JOINT DISEASE, KNEE 03/14/2008    Priority: Low  . VARICOSE VEINS LOWER EXTREMITIES W/INFLAMMATION 03/09/2007    Priority: Low  . Allergic rhinitis 03/09/2007    Priority: Low  . ACTINIC KERATOSIS, FOREHEAD, LEFT 03/09/2007    Priority: Low  . Headache(784.0) 03/09/2007    Priority: Low  . MENIERE'S DISEASE 09/22/2006    Priority: Low  . RAYNAUD'S DISEASE 09/22/2006    Priority: Low    Medications- reviewed and updated Current Outpatient Medications  Medication Sig Dispense Refill  . amLODipine (NORVASC) 2.5 MG tablet TAKE 1 TABLET BY MOUTH EVERY DAY 30 tablet 5  . aspirin 81 MG tablet Take 81 mg by mouth daily.      . fenofibrate 160 MG tablet TAKE 1 TABLET BY MOUTH EVERY DAY 90 tablet 2  . loratadine (CLARITIN) 10 MG tablet Take 10 mg by mouth daily. Take 1/2 tablet daily.    . metoprolol tartrate (LOPRESSOR) 50 MG tablet TAKE 1/2 TABLET BY MOUTH 2 TIMES DAILY 90 tablet 1  . montelukast (SINGULAIR) 10 MG tablet TAKE  1 TABLET BY MOUTH AT BEDTIME 30 tablet 5  . pantoprazole (PROTONIX) 40 MG tablet TAKE 1 TABLET BY MOUTH EVERY DAY 30 tablet 5  . Vitamin D, Cholecalciferol, 1000 units TABS Take by mouth.     No current facility-administered medications for this visit.     Objective: BP 126/78 (BP Location: Left Arm, Patient Position: Sitting, Cuff Size: Normal)   Pulse 72   Temp 97.9 F (36.6 C) (Oral)   Ht 5' 4.5" (1.638 m)   Wt 125 lb 9.6 oz (57 kg)   SpO2 96%   BMI 21.23 kg/m  Gen: NAD, resting comfortably Edematous turbinates with clear discharge. TM normal CV: RRR no murmurs rubs or gallops Lungs: CTAB no crackles, wheeze, rhonchi Abdomen: soft/nontender/nondistended/normal bowel sounds.  Ext: no edema Skin: warm, dry MSK: some tenderness in musculature of shoulders- no stiffness and good range of motion.   Assessment/Plan:  Other fatigue - Plan: CBC with Differential/Platelet, Comprehensive metabolic panel, TSH, Sedimentation rate, C-reactive protein  Bilateral shoulder pain, unspecified chronicity - Plan: Sedimentation rate, C-reactive protein S: recently moved and having to do fair amount of lifting plus had exposures to dust. having some watery, itchy eyes and some achiness starting around time of the physical. Having some hot flashes. Mornings are bad but not as bad afternoon. No stiffness- some soreness but could be from lifting.  achiness  is worse with hot flashes. Some flushing.Getting multiple hot flashes, some aching in her muscles. Has had some achiness in the past with allergies. Still on singulair and claritin. No tick bites. Has to start drops this week for cataract surgery and she is anxious about having another condition going on going into surgery. No fever.  Does not report hot flashes waking her up at night or night sweats.  History of PMR years ago. Apparently sed rates up over 70 in the past. CRp over 15     Blood pressure has been ok with this. 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  We will get esr, crp, tsh, cbc diff. If this is related to allergies- could try some prednisone anyway but with upcoming cataract surgery may not be the best time. I think patient will have a lot of peace of mind if ESR and CRP not elevated. May be worth watchful waiting if labs are ok.   Horner's syndrome She brings in documentation from [place]. Noted to have Fibromuscular dysplasia- had TIA postpartum. Letter from Dr. Alden Hipp of neurology states "transcranial and neck doppler and ultrasound studies are completely within normal limits and from our point of view your vessels have healed" from 04/29/1987.   Allergic rhinitis Allergies possibly causing fatigue and achiness but need to investigate other causes.  Continue Singulair and Claritin  Hypertension Blood pressure looks good today.  Continue Metoprolol 25 mg BID, amlodipine 2.61m for now.  If hot flashes/flushing feeling continues could consider holding amlodipine which can contribute to flushing  Future Appointments  Date Time Provider [place] 07/12/2017  8:15 AM [facility]  10/24/2017  2:00 PM DWilliemae Area RN [facility]  12/23/2017  8:15 AM HYong ChannelSBrayton Mars MD [facility]   Lab/Order associations: Other fatigue - Plan: CBC with Differential/Platelet, Comprehensive metabolic panel, TSH, Sedimentation rate, C-reactive protein  Bilateral shoulder pain, unspecified chronicity - Plan: Sedimentation rate, C-reactive protein  Return precautions advised.  SGarret Reddish MD

## 2017-07-11 NOTE — Patient Instructions (Signed)
This could be allergies. Could try prednisone but would want to clear with your eye doctor  Come back for labs tomorrow morning- schedule before you leave

## 2017-07-12 ENCOUNTER — Other Ambulatory Visit (INDEPENDENT_AMBULATORY_CARE_PROVIDER_SITE_OTHER): Payer: Medicare Other

## 2017-07-12 ENCOUNTER — Encounter: Payer: Self-pay | Admitting: Family Medicine

## 2017-07-12 DIAGNOSIS — M25511 Pain in right shoulder: Secondary | ICD-10-CM

## 2017-07-12 DIAGNOSIS — M25512 Pain in left shoulder: Secondary | ICD-10-CM | POA: Diagnosis not present

## 2017-07-12 DIAGNOSIS — R5383 Other fatigue: Secondary | ICD-10-CM

## 2017-07-12 LAB — CBC WITH DIFFERENTIAL/PLATELET
Basophils Absolute: 0 10*3/uL (ref 0.0–0.1)
Basophils Relative: 0.4 % (ref 0.0–3.0)
Eosinophils Absolute: 0.2 10*3/uL (ref 0.0–0.7)
Eosinophils Relative: 3.4 % (ref 0.0–5.0)
HCT: 38.8 % (ref 36.0–46.0)
Hemoglobin: 12.7 g/dL (ref 12.0–15.0)
Lymphocytes Relative: 41.1 % (ref 12.0–46.0)
Lymphs Abs: 2.7 10*3/uL (ref 0.7–4.0)
MCHC: 32.7 g/dL (ref 30.0–36.0)
MCV: 85.1 fl (ref 78.0–100.0)
Monocytes Absolute: 0.8 10*3/uL (ref 0.1–1.0)
Monocytes Relative: 11.3 % (ref 3.0–12.0)
Neutro Abs: 2.9 10*3/uL (ref 1.4–7.7)
Neutrophils Relative %: 43.8 % (ref 43.0–77.0)
Platelets: 326 10*3/uL (ref 150.0–400.0)
RBC: 4.56 Mil/uL (ref 3.87–5.11)
RDW: 14.1 % (ref 11.5–15.5)
WBC: 6.7 10*3/uL (ref 4.0–10.5)

## 2017-07-12 LAB — COMPREHENSIVE METABOLIC PANEL
ALBUMIN: 4.3 g/dL (ref 3.5–5.2)
ALK PHOS: 47 U/L (ref 39–117)
ALT: 17 U/L (ref 0–35)
AST: 22 U/L (ref 0–37)
BILIRUBIN TOTAL: 0.4 mg/dL (ref 0.2–1.2)
BUN: 28 mg/dL — ABNORMAL HIGH (ref 6–23)
CALCIUM: 9.8 mg/dL (ref 8.4–10.5)
CHLORIDE: 103 meq/L (ref 96–112)
CO2: 30 mEq/L (ref 19–32)
CREATININE: 0.68 mg/dL (ref 0.40–1.20)
GFR: 89.57 mL/min (ref >60.00)
Glucose, Bld: 79 mg/dL (ref 70–99)
Potassium: 3.7 mEq/L (ref 3.5–5.1)
Sodium: 140 mEq/L (ref 135–145)
TOTAL PROTEIN: 7.7 g/dL (ref 6.0–8.3)

## 2017-07-12 LAB — SEDIMENTATION RATE: Sed Rate: 39 mm/hr — ABNORMAL HIGH (ref 0–30)

## 2017-07-12 LAB — C-REACTIVE PROTEIN: CRP: 0.4 mg/dL — AB (ref 0.5–20.0)

## 2017-07-12 LAB — TSH: TSH: 1.97 u[IU]/mL (ref 0.35–4.50)

## 2017-07-12 NOTE — Progress Notes (Signed)
Your CRP was not elevated which points against polymyalgia rheumatica.  Your sedimentation rate is very mildly elevated and typically polymyalgia rheumatica has values well over 50-I strongly doubt polymyalgia rheumatica. Your CBC was normal (blood counts, infection fighting cells, platelets). Your CMET was normal (kidney, liver, and electrolytes, blood sugar)   Other than showing mild dehydration- this could cause fatigue.  Please make sure to remain well-hydrated. Your thyroid was normal.

## 2017-07-18 DIAGNOSIS — H2511 Age-related nuclear cataract, right eye: Secondary | ICD-10-CM | POA: Diagnosis not present

## 2017-07-18 DIAGNOSIS — H25011 Cortical age-related cataract, right eye: Secondary | ICD-10-CM | POA: Diagnosis not present

## 2017-07-19 DIAGNOSIS — H2512 Age-related nuclear cataract, left eye: Secondary | ICD-10-CM | POA: Diagnosis not present

## 2017-08-08 DIAGNOSIS — H25012 Cortical age-related cataract, left eye: Secondary | ICD-10-CM | POA: Diagnosis not present

## 2017-08-08 DIAGNOSIS — H2512 Age-related nuclear cataract, left eye: Secondary | ICD-10-CM | POA: Diagnosis not present

## 2017-08-11 ENCOUNTER — Other Ambulatory Visit: Payer: Self-pay | Admitting: Family Medicine

## 2017-09-14 ENCOUNTER — Ambulatory Visit (INDEPENDENT_AMBULATORY_CARE_PROVIDER_SITE_OTHER): Payer: Medicare Other

## 2017-09-14 ENCOUNTER — Ambulatory Visit (INDEPENDENT_AMBULATORY_CARE_PROVIDER_SITE_OTHER): Payer: Medicare Other | Admitting: Family Medicine

## 2017-09-14 ENCOUNTER — Encounter: Payer: Self-pay | Admitting: Family Medicine

## 2017-09-14 VITALS — BP 140/68 | HR 70 | Temp 97.8°F | Ht 64.5 in | Wt 128.4 lb

## 2017-09-14 DIAGNOSIS — M1712 Unilateral primary osteoarthritis, left knee: Secondary | ICD-10-CM

## 2017-09-14 DIAGNOSIS — I10 Essential (primary) hypertension: Secondary | ICD-10-CM | POA: Diagnosis not present

## 2017-09-14 DIAGNOSIS — G8929 Other chronic pain: Secondary | ICD-10-CM

## 2017-09-14 DIAGNOSIS — M25562 Pain in left knee: Secondary | ICD-10-CM

## 2017-09-14 NOTE — Patient Instructions (Signed)
X-ray today before you leave. We will consider next steps once we have results back

## 2017-09-14 NOTE — Progress Notes (Signed)
Subjective:  Lori [name] is a 77 y.o. year old very pleasant female patient who presents for/with See problem oriented charting ROS- complains of knee pain particularly medial joint line, swelling at times. Pain off and on. No fever or chills. No redness around knee   Past Medical History-  Patient Active Problem List   Diagnosis Date Noted  . 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 Hypertension 11/29/2013    Priority: Medium  . Hyperlipidemia 11/29/2013    Priority: Medium  . Barrett's esophagus 08/24/2013    Priority: Medium  . Osteoporosis 09/22/2006    Priority: Medium  . Aortic atherosclerosis ([place]) 02/11/2016    Priority: Low  . Eczema 11/29/2013    Priority: Low  . GERD (gastroesophageal reflux disease) 05/16/2013    Priority: Low  . Vitamin D deficiency 01/29/2012    Priority: Low  . Solitary pulmonary nodule 06/08/2011    Priority: Low  . HIATAL HERNIA WITH REFLUX 02/06/2010    Priority: Low  . DEGENERATIVE JOINT DISEASE, KNEE 03/14/2008    Priority: Low  . VARICOSE VEINS LOWER EXTREMITIES W/INFLAMMATION 03/09/2007    Priority: Low  . Allergic rhinitis 03/09/2007    Priority: Low  . ACTINIC KERATOSIS, FOREHEAD, LEFT 03/09/2007    Priority: Low  . Headache(784.0) 03/09/2007    Priority: Low  . MENIERE'S DISEASE 09/22/2006    Priority: Low  . RAYNAUD'S DISEASE 09/22/2006    Priority: Low  . Osteoarthritis of left knee 09/15/2017    Medications- reviewed and updated Current Outpatient Medications  Medication Sig Dispense Refill  . amLODipine (NORVASC) 2.5 MG tablet TAKE 1 TABLET BY MOUTH EVERY DAY 30 tablet 5  . aspirin 81 MG tablet Take 81 mg by mouth daily.      . fenofibrate 160 MG tablet TAKE 1 TABLET BY MOUTH EVERY DAY 90 tablet 2  . loratadine (CLARITIN) 10 MG tablet Take 10 mg by mouth daily. Take 1/2 tablet daily.    . metoprolol tartrate (LOPRESSOR) 50 MG tablet TAKE 1/2 TABLET BY MOUTH 2 TIMES DAILY 90 tablet 1  . montelukast  (SINGULAIR) 10 MG tablet TAKE 1 TABLET BY MOUTH AT BEDTIME 30 tablet 5  . pantoprazole (PROTONIX) 40 MG tablet TAKE 1 TABLET BY MOUTH EVERY DAY 30 tablet 5  . Vitamin D, Cholecalciferol, 1000 units TABS Take by mouth.     No current facility-administered medications for this visit.     Objective: BP 140/68 (BP Location: Left Arm, Patient Position: Sitting, Cuff Size: Normal)   Pulse 70   Temp 97.8 F (36.6 C) (Oral)   Ht 5' 4.5" (1.638 m)   Wt 128 lb 6.1 oz (58.2 kg)   SpO2 96%   BMI 21.70 kg/m  Gen: NAD, resting comfortably CV: RRR no murmurs rubs or gallops Lungs: nonlabored, normal respiratory rate Abdomen: soft/nontender/nondistended/normal bowel sounds. Ext: no edema Skin: warm, dry, no rash  Right knee normal Left Knee: Normal to inspection with no erythema or obvious bony abnormalities. Slight effusion left knee Palpation shows  no warmth but she does have medial joint line tenderness. Also some pain along quadriceps tendon ROM normal in flexion. Difficult to get patient to relax leg fully- she tightens up consistently.  Ligaments with solid consistent endpoints including ACL, PCL, LCL, MCL. She tightens with Mcmurray's- has pain but I cannot clearly feel click Non painful patellar compression.   Assessment/Plan:  Osteoarthritis of left knee S:  left knee pain off and on for last few  months but has had for years- just worsening recently. Marland Kitchen History surgery 2009 on left knee- arthroscopic for left meniscus. Scanned in report of MRI  Gets stinging sensation in left knee intermittently- usually when it flares up it is present for several days then resolves for for a few days before flaring up again. Swells up at times and uses ice packs which helps some  Either above knee cap or medial joing line. Sleeps with pillow under her left  leg to help her- hurts worse if leaves out at straight out? A/P: x-ray today shows OA changes particularly medial compartment- consistent with  her history- could have degenerative meniscal tear in portion remaining of meniscus as well. With BP high normal/low high and being on BP meds I dont think long term nsaids are best idea- nor are they the best given barrett's history. I do think steroid injections would be reasonable (though not the most ideal with osteoporosis). I would consider referral to Dr. Paulla Fore if we can get at least 4-5 months out of steroid injections to consider other options. She has a follow up in July and we can start injections at that time.    Hypertension S: controlled mildly poorly on metoprolol 25mg  BID, amlodipine 2.5mg  BP Readings from Last 3 Encounters:  09/14/17 140/68  07/11/17 126/78  06/22/17 122/70  A/P: We discussed blood pressure goal of <140/90. Continue current meds:  givne last 2 visits controlled and having some pain today as well as anxiety about knee- could consider increasing amlodipine to 5 mg at follow up in July if remains elevated     Future Appointments  Date Time Provider [place]  09/26/2017  2:45 PM [name], MD [facility]  10/24/2017  2:00 PM Williemae Area, RN [facility]  12/23/2017  8:15 AM [name], MD [facility]   Lab/Order associations: Chronic pain of left knee - Plan: DG Knee 3 Views Left  Osteoarthritis of left knee, unspecified osteoarthritis type  Essential hypertension  Time Stamp The duration of face-to-face time during this visit was greater than 25 minutes. Greater than 50% of this time was spent in counseling, explanation of diagnosis, planning of further management, and/or coordination of care including discussion of potential causes of knee pain, discussion of possible solutions including getting our sports medicine doctor involved, counseling patient as it has been difficult for her to deal with this pain, also we counseled on continuing aspirin in her unique situation with stroke history.   Return precautions advised.   Garret Reddish, MD

## 2017-09-15 DIAGNOSIS — M1712 Unilateral primary osteoarthritis, left knee: Secondary | ICD-10-CM | POA: Insufficient documentation

## 2017-09-15 NOTE — Assessment & Plan Note (Signed)
S: controlled mildly poorly on metoprolol 25mg  BID, amlodipine 2.5mg  BP Readings from Last 3 Encounters:  09/14/17 140/68  07/11/17 126/78  06/22/17 122/70  A/P: We discussed blood pressure goal of <140/90. Continue current meds:  givne last 2 visits controlled and having some pain today as well as anxiety about knee- could consider increasing amlodipine to 5 mg at follow up in July if remains elevated

## 2017-09-15 NOTE — Assessment & Plan Note (Signed)
S:  left knee pain off and on for last few months but has had for years- just worsening recently. Marland Kitchen History surgery 2009 on left knee- arthroscopic for left meniscus. Scanned in report of MRI  Gets stinging sensation in left knee intermittently- usually when it flares up it is present for several days then resolves for for a few days before flaring up again. Swells up at times and uses ice packs which helps some  Either above knee cap or medial joing line. Sleeps with pillow under her left  leg to help her- hurts worse if leaves out at straight out? A/P: x-ray today shows OA changes particularly medial compartment- consistent with her history- could have degenerative meniscal tear in portion remaining of meniscus as well. With BP high normal/low high and being on BP meds I dont think long term nsaids are best idea- nor are they the best given barrett's history. I do think steroid injections would be reasonable (though not the most ideal with osteoporosis). I would consider referral to Dr. Paulla Fore if we can get at least 4-5 months out of steroid injections to consider other options. She has a follow up in July and we can start injections at that time.

## 2017-09-26 ENCOUNTER — Ambulatory Visit (INDEPENDENT_AMBULATORY_CARE_PROVIDER_SITE_OTHER): Payer: Medicare Other | Admitting: Family Medicine

## 2017-09-26 ENCOUNTER — Encounter: Payer: Self-pay | Admitting: Family Medicine

## 2017-09-26 VITALS — BP 112/62 | HR 98 | Temp 98.1°F | Ht 64.5 in | Wt 129.0 lb

## 2017-09-26 DIAGNOSIS — M1712 Unilateral primary osteoarthritis, left knee: Secondary | ICD-10-CM | POA: Diagnosis not present

## 2017-09-26 DIAGNOSIS — H01005 Unspecified blepharitis left lower eyelid: Secondary | ICD-10-CM | POA: Diagnosis not present

## 2017-09-26 MED ORDER — METHYLPREDNISOLONE ACETATE 80 MG/ML IJ SUSP
80.0000 mg | Freq: Once | INTRAMUSCULAR | Status: AC
  Administered 2017-09-26: 80 mg via INTRA_ARTICULAR

## 2017-09-26 NOTE — Patient Instructions (Addendum)
  You had a steroid injection today into your left knee.  Things to be aware of after injection are listed below: . You may experience no significant improvement or even a slight worsening in your symptoms during the first 24 to 48 hours.  After that we expect your symptoms to improve gradually over the next 2 weeks for the medicine to have its maximal effect.  You should continue to have improvement out to 6 weeks after your injection. . Dr. Yong Channel recommends icing the site of the injection for 20 minutes at least 2 times the day of your injection . You may shower but no swimming, tub bath or Jacuzzi for 24 hours. . If your bandage falls off this does not need to be replaced.  It is appropriate to remove the bandage after 4 hours. . You may resume light activities as tolerated unless otherwise directed per Dr. Paulla Fore during your visit  POSSIBLE STEROID SIDE EFFECTS:  Side effects from injectable steroids tend to be less than when taken orally however you may experience some of the symptoms listed below.  If experienced these should only last for a short period of time. Change in menstrual flow  Edema (swelling)  Increased appetite Skin flushing (redness)  Skin rash/acne  Thrush (oral) Yeast vaginitis    Increased sweating  Depression Increased blood glucose levels Cramping and leg/calf  Euphoria (feeling happy)  POSSIBLE PROCEDURE SIDE EFFECTS: The side effects of the injection are usually fairly minimal however if you may experience some of the following side effects that are usually self-limited and will is off on their own.  If you are concerned please feel free to call the office with questions:  Increased numbness or tingling  Nausea or vomiting  Swelling or bruising at the injection site   Please call our office if if you experience any of the following symptoms over the next week as these can be signs of infection:   Fever greater than 100.22F  Significant swelling at the injection  site  Significant redness or drainage from the injection site  If after 2 weeks you are continuing to have worsening symptoms please call our office to discuss what the next appropriate actions should be including the potential for a return office visit or other diagnostic testing.

## 2017-09-26 NOTE — Progress Notes (Signed)
Subjective:  Lori [name] is a 77 y.o. year old very pleasant female patient who presents for/with See problem oriented charting ROS- admits to anxiety about injection. Has some redness on left lower eyelid. No fever or chills. No redness around knee.    Past Medical History-  Patient Active Problem List   Diagnosis Date Noted  . Osteoarthritis of left knee 09/15/2017    Priority: Medium  . 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 Hypertension 11/29/2013    Priority: Medium  . Hyperlipidemia 11/29/2013    Priority: Medium  . Barrett's esophagus 08/24/2013    Priority: Medium  . Osteoporosis 09/22/2006    Priority: Medium  . Aortic atherosclerosis ([place]) 02/11/2016    Priority: Low  . Eczema 11/29/2013    Priority: Low  . GERD (gastroesophageal reflux disease) 05/16/2013    Priority: Low  . Vitamin D deficiency 01/29/2012    Priority: Low  . Solitary pulmonary nodule 06/08/2011    Priority: Low  . HIATAL HERNIA WITH REFLUX 02/06/2010    Priority: Low  . DEGENERATIVE JOINT DISEASE, KNEE 03/14/2008    Priority: Low  . VARICOSE VEINS LOWER EXTREMITIES W/INFLAMMATION 03/09/2007    Priority: Low  . Allergic rhinitis 03/09/2007    Priority: Low  . ACTINIC KERATOSIS, FOREHEAD, LEFT 03/09/2007    Priority: Low  . Headache(784.0) 03/09/2007    Priority: Low  . MENIERE'S DISEASE 09/22/2006    Priority: Low  . RAYNAUD'S DISEASE 09/22/2006    Priority: Low    Medications- reviewed and updated Current Outpatient Medications  Medication Sig Dispense Refill  . Artificial Tear Solution (SOOTHE XP) SOLN Apply to eye.    Marland Kitchen amLODipine (NORVASC) 2.5 MG tablet TAKE 1 TABLET BY MOUTH EVERY DAY 30 tablet 5  . aspirin 81 MG tablet Take 81 mg by mouth daily.      . fenofibrate 160 MG tablet TAKE 1 TABLET BY MOUTH EVERY DAY 90 tablet 2  . loratadine (CLARITIN) 10 MG tablet Take 10 mg by mouth daily. Take 1/2 tablet daily.    . metoprolol tartrate (LOPRESSOR) 50 MG tablet  TAKE 1/2 TABLET BY MOUTH 2 TIMES DAILY 90 tablet 1  . montelukast (SINGULAIR) 10 MG tablet TAKE 1 TABLET BY MOUTH AT BEDTIME 30 tablet 5  . pantoprazole (PROTONIX) 40 MG tablet TAKE 1 TABLET BY MOUTH EVERY DAY 30 tablet 5  . Vitamin D, Cholecalciferol, 1000 units TABS Take by mouth.     No current facility-administered medications for this visit.     Objective: BP 112/62 (BP Location: Left Arm, Patient Position: Sitting, Cuff Size: Normal)   Pulse 98   Temp 98.1 F (36.7 C) (Oral)   Ht 5' 4.5" (1.638 m)   Wt 129 lb (58.5 kg)   SpO2 96%   BMI 21.80 kg/m  Gen: NAD, resting comfortably Mild erythema at base of each eyelash on left eye without discharge Knee: slight effusion. Nontender to palpation other than some pain along medial joint line.    Verbal consent obtained and verified for left knee injection Sterile betadine prep. Furthur cleansed with alcohol. Topical analgesic spray: Ethyl chloride. Joint: left knee Approached in typical fashion with: anteromedial approach Completed without difficulty Meds: 3cc lidocaine without epinephrine and 1 cc of depo medrol 80 mg/cc Needle:1 1/2 inch 25 gauge needle Aftercare instructions and Red flags advised.  Assessment/Plan:  Blepharitis S: left eye blepharitis - being treated by optho but her MD out of town for 2 weeks A/P:  has an antibiotic ointment to use- she tried it and causes mild itching . Stopped and improved but eyeylid no longer improving- advised her to trial again.    Osteoarthritis of left knee S: left knee OA. At its worst pain can keep her up at night. 7-8/10. Right now walking it is abotu 3-4/10. aspercreme can help.   Evidence of OA on last imaging.  A/P: She returns today to discuss injection further- she needed additional counseling. We discussed trial aspercreme or voltaren. She has granddaughter coming next week and really wants to be at her best so opts for injection. Injection provided today as above. Verbal  consent obtained before procedure. Extensive counseling required for patient- she has anxiety about many medical interventions and was fearful of pain/needle- luckily she did very well   Future Appointments  Date Time Provider [place]  10/24/2017  2:00 PM Williemae Area, RN [facility]  12/23/2017  8:15 AM Yong Channel, Brayton Mars, MD [facility]   No follow-ups on file.  Lab/Order associations: Osteoarthritis of left knee, unspecified osteoarthritis type - Plan: methylPREDNISolone acetate (DEPO-MEDROL) injection 80 mg  Blepharitis of left lower eyelid, unspecified type  Meds ordered this encounter  Medications  . methylPREDNISolone acetate (DEPO-MEDROL) injection 80 mg    Return precautions advised.  Garret Reddish, MD

## 2017-09-26 NOTE — Assessment & Plan Note (Signed)
S: left knee OA. At its worst pain can keep her up at night. 7-8/10. Right now walking it is abotu 3-4/10. aspercreme can help.   Evidence of OA on last imaging.  A/P: She returns today to discuss injection further- she needed additional counseling. We discussed trial aspercreme or voltaren. She has granddaughter coming next week and really wants to be at her best so opts for injection. Injection provided today as above. Verbal consent obtained before procedure. Extensive counseling required for patient- she has anxiety about many medical interventions and was fearful of pain/needle- luckily she did very well

## 2017-10-24 ENCOUNTER — Ambulatory Visit: Payer: Medicare Other | Admitting: *Deleted

## 2017-10-31 ENCOUNTER — Encounter: Payer: Self-pay | Admitting: Family Medicine

## 2017-10-31 ENCOUNTER — Other Ambulatory Visit: Payer: Self-pay | Admitting: Family Medicine

## 2017-11-10 ENCOUNTER — Other Ambulatory Visit: Payer: Self-pay | Admitting: Family Medicine

## 2017-11-16 NOTE — Progress Notes (Addendum)
Subjective:   Lori [name] is a 77 y.o. female who presents for Medicare Annual (Subsequent) preventive examination.  Reports health as good  Seen Dr. Yong Channel 7/8/219   Diet Chol/hdl 2  A1c 6.2 and is pre-diabetic but this was in 2008 Had prednisone for over a year  BMI 20    Exercise Walks for exercise Cooks and cleans Walks dog   Health Maintenance Due  Topic Date Due  . INFLUENZA VACCINE  10/20/2017   Will get flu vaccine on oct 4 during OV  Dexa 07/2016  -1.9 Drinks skim milk , yogurt and vit  D  Mammogram 05/2017   Colonoscopy 02/2013- 02/2018 already understands she will have this in Dec or January  Postponed Pneumonia series   Educate regarding shingrix - can't take shingles or pneumonia  Something in the vaccines that she is allergic too  Allergic to neomycin in pneumonia vaccines   Meds; reacted to some of the drops prior to cataract         Objective:     Vitals: BP 126/68   Pulse 65   Temp 98.8 F (37.1 C)   Ht 5' 5.5" (1.664 m)   Wt 127 lb (57.6 kg)   SpO2 95%   BMI 20.81 kg/m   Body mass index is 20.81 kg/m.  Advanced Directives 11/17/2017 10/22/2016 09/24/2016  Does Patient Have a Medical Advance Directive? Yes Yes Yes  Type of Advance Directive - - Living will;Healthcare Power of Attorney    Tobacco Social History   Tobacco Use  Smoking Status Never Smoker  Smokeless Tobacco Never Used     Counseling given: Yes   Clinical Intake:   Past Medical History:  Diagnosis Date  . Allergy   . Anemia    past hx of anemia  . Aneurysm ([place])    pseudo-aneurym of carotid arteries per pt  . Arthritis    knee  . Barrett's esophagus   . Cataract    bilateral   . 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 Eczema   . GERD (gastroesophageal reflux disease)   . Headache(784.0)   . History of IBS   . Hyperlipidemia    on medication  . Hypertension   . Low back pain   . Meniere disease   .  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 Neuromuscular disorder ([facility])    Hiatal Hernia, Raynauds disease  . Osteopenia   . 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[facility])   . 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 Tubular adenoma of colon 02/2013  . Varicose veins with inflammation   . Vasculitis [facility])    Past Surgical History:  Procedure Laterality Date  . arthroscopic knee     left knee/ torn meniscus  . BUNIONECTOMY Right 1998   with other foot surgery   . carotid artery disection  1988   Carotid Artery Dissection  . CESAREAN SECTION     1 time  . COLONOSCOPY    . DILATION AND CURETTAGE OF UTERUS    . POLYPECTOMY    . POPLITEAL SYNOVIAL CYST EXCISION     left leg  . TONSILLECTOMY     Family History  Problem Relation Age of Onset  . Multiple myeloma Father   . Kidney disease Brother        cancer- removed  . Prostate cancer Brother   . Thyroid cancer Sister  s/p removal. papilary and anaplastic.   . Lung cancer Sister   . Alzheimer's disease Brother        older broterh  . Stroke Brother        88-older brother  . Colon cancer Neg Hx   . Esophageal cancer Neg Hx   . Rectal cancer Neg Hx   . Stomach cancer Neg Hx   . Colon polyps Neg Hx    Social History   Socioeconomic History  . Marital status: Married    Spouse name: Not on file  . Number of children: Not on file  . Years of education: Not on file  . Highest education level: Not on file  Occupational History  . Occupation: retired  Scientific laboratory technician  . Financial resource strain: Not on file  . Food insecurity:    Worry: Not on file    Inability: Not on file  . Transportation needs:    Medical: Not on file    Non-medical: Not on file  Tobacco Use  . Smoking status: Never Smoker  . Smokeless tobacco: Never Used  Substance and Sexual Activity  . Alcohol use: No    Alcohol/week: 0.0 standard drinks  . Drug use: No  . Sexual activity: Yes  Lifestyle  . Physical activity:    Days per  week: Not on file    Minutes per session: Not on file  . Stress: Not on file  Relationships  . Social connections:    Talks on phone: Not on file    Gets together: Not on file    Attends religious service: Not on file    Active member of club or organization: Not on file    Attends meetings of clubs or organizations: Not on file    Relationship status: Not on file  Other Topics Concern  . Not on file  Social History Narrative  . Not on file    Outpatient Encounter Medications as of 11/17/2017  Medication Sig  . amLODipine (NORVASC) 2.5 MG tablet TAKE 1 TABLET BY MOUTH EVERY DAY  . Artificial Tear Solution (SOOTHE XP) SOLN Apply to eye.  Marland Kitchen aspirin 81 MG tablet Take 81 mg by mouth daily.    . fenofibrate 160 MG tablet TAKE 1 TABLET BY MOUTH EVERY DAY  . loratadine (CLARITIN) 10 MG tablet Take 10 mg by mouth daily. Take 1/2 tablet daily.  . metoprolol tartrate (LOPRESSOR) 50 MG tablet TAKE 1/2 TABLET BY MOUTH 2 TIMES DAILY  . montelukast (SINGULAIR) 10 MG tablet TAKE 1 TABLET BY MOUTH AT BEDTIME  . pantoprazole (PROTONIX) 40 MG tablet TAKE 1 TABLET BY MOUTH EVERY DAY  . Vitamin D, Cholecalciferol, 1000 units TABS Take by mouth.   No facility-administered encounter medications on file as of 11/17/2017.     Activities of Daily Living No flowsheet data found.  Patient Care Team: [name], MD as PCP - General (Family Medicine)    Assessment:   This is a routine wellness examination for Lori [name].  Exercise Activities and Dietary recommendations    Goals    . Exercise 150 min/wk Moderate Activity     Will try to get on the exercise bike      . patient     Wants to take of herself  Plan to start walking 2 neighbors   May consider swimming    . patient centered     [place] [facility]/ [facility]/ fall You can check out what we have discussed to increase  mobility       Fall Risk Fall Risk  11/17/2017 12/22/2016 12/12/2015 05/30/2014 10/02/2012  Falls in the past year?  No No No No Yes  Number falls in past yr: - - - - 1  Injury with Fall? - - - - No  Risk for fall due to : - - - - Other (Comment)     Depression Screen PHQ 2/9 Scores 11/17/2017 12/22/2016 12/12/2015 05/30/2014  PHQ - 2 Score 0 0 0 0     Cognitive Function     Ad8 score reviewed for issues:  Issues making decisions:  Less interest in hobbies / activities:  Repeats questions, stories (family complaining):  Trouble using ordinary gadgets (microwave, computer, phone):  Forgets the month or year:   Mismanaging finances:   Remembering appts:  Daily problems with thinking and/or memory: Ad8 score is=0       Immunization History  Administered Date(s) Administered  . Influenza Split 01/04/2012  . Influenza Whole 01/03/2007, 12/08/2007, 12/17/2008, 01/20/2010  . Influenza, High Dose Seasonal PF 01/10/2015, 12/12/2015, 12/29/2016  . Influenza,inj,Quad PF,6+ Mos 01/05/2013, 12/24/2013  . Pneumococcal Polysaccharide-23 02/12/2004  . Td 03/23/2003, 04/04/2013      Screening Tests Health Maintenance  Topic Date Due  . INFLUENZA VACCINE  10/20/2017  . PNA vac Low Risk Adult (1 of 2 - PCV13) 05/29/2024 (Originally 03/11/2007)  . COLONOSCOPY  03/13/2018  . TETANUS/TDAP  04/05/2023  . DEXA SCAN  Completed        Plan:      PCP Notes   Health Maintenance Will get flu vaccine on oct 4 during OV   Dexa 07/2016  -1.9 Drinks skim milk , yogurt and vit  D  Mammogram 05/2017   Colonoscopy 02/2013- 02/2018 already understands she will have this in Dec or January  Postponed Pneumonia series   Educate regarding shingrix - can't take shingles or pneumonia  Something in the vaccines that she is allergic too  Allergic to neomycin in pneumonia vaccines   Meds; reacted to some of the drops prior to cataract     Abnormal Screens  none  Referrals  none  Patient concerns; As noted  Nurse Concerns; As noted  Next PCP apt 10/4      I have personally  reviewed and noted the following in the patient's chart:   . Medical and social history . Use of alcohol, tobacco or illicit drugs  . Current medications and supplements . Functional ability and status . Nutritional status . Physical activity . Advanced directives . List of other physicians . Hospitalizations, surgeries, and ER visits in previous 12 months . Vitals . Screenings to include cognitive, depression, and falls . Referrals and appointments  In addition, I have reviewed and discussed with patient certain preventive protocols, quality metrics, and best practice recommendations. A written personalized care plan for preventive services as well as general preventive health recommendations were provided to patient.     GOTLX,BWIOM, RN  11/17/2017    I have reviewed and agree with note, evaluation, plan.   Garret Reddish, MD

## 2017-11-17 ENCOUNTER — Ambulatory Visit (INDEPENDENT_AMBULATORY_CARE_PROVIDER_SITE_OTHER): Payer: Medicare Other

## 2017-11-17 VITALS — BP 126/68 | HR 65 | Temp 98.8°F | Ht 65.5 in | Wt 127.0 lb

## 2017-11-17 DIAGNOSIS — Z0000 Encounter for general adult medical examination without abnormal findings: Secondary | ICD-10-CM

## 2017-11-17 NOTE — Patient Instructions (Addendum)
Lori [name] , Thank you for taking time to come for your Medicare Wellness Visit. I appreciate your ongoing commitment to your health goals. Please review the following plan we discussed and let me know if I can assist you in the future.   Will take your flu vaccine 10/4   Can't take shingrix but will discuss with Dr. Yong Channel  Colonoscopy due in December     These are the goals we discussed: Goals    . patient     Wants to take of herself  Plan to start walking 2 neighbors   May consider swimming    . patient centered     [place] [facility]/ [place] [facility]/ fall You can check out what we have discussed to increase mobility       This is a list of the screening recommended for you and due dates:  Health Maintenance  Topic Date Due  . Flu Shot  10/20/2017  . Pneumonia vaccines (1 of 2 - PCV13) 05/29/2024*  . Colon Cancer Screening  03/13/2018  . Tetanus Vaccine  04/05/2023  . DEXA scan (bone density measurement)  Completed  *Topic was postponed. The date shown is not the original due date.      Fall Prevention in the Home Falls can cause injuries. They can happen to people of all ages. There are many things you can do to make your home safe and to help prevent falls. What can I do on the outside of my home?  Regularly fix the edges of walkways and driveways and fix any cracks.  Remove anything that might make you trip as you walk through a door, such as a raised step or threshold.  Trim any bushes or trees on the path to your home.  Use bright outdoor lighting.  Clear any walking paths of anything that might make someone trip, such as rocks or tools.  Regularly check to see if handrails are loose or broken. Make sure that both sides of any steps have handrails.  Any raised decks and porches should have guardrails on the edges.  Have any leaves, snow, or ice cleared regularly.  Use sand or salt on walking paths during winter.  Clean up any spills in your garage  right away. This includes oil or grease spills. What can I do in the bathroom?  Use night lights.  Install grab bars by the toilet and in the tub and shower. Do not use towel bars as grab bars.  Use non-skid mats or decals in the tub or shower.  If you need to sit down in the shower, use a plastic, non-slip stool.  Keep the floor dry. Clean up any water that spills on the floor as soon as it happens.  Remove soap buildup in the tub or shower regularly.  Attach bath mats securely with double-sided non-slip rug tape.  Do not have throw rugs and other things on the floor that can make you trip. What can I do in the bedroom?  Use night lights.  Make sure that you have a light by your bed that is easy to reach.  Do not use any sheets or blankets that are too big for your bed. They should not hang down onto the floor.  Have a firm chair that has side arms. You can use this for support while you get dressed.  Do not have throw rugs and other things on the floor that can make you trip. What can I do in the  kitchen?  Clean up any spills right away.  Avoid walking on wet floors.  Keep items that you use a lot in easy-to-reach places.  If you need to reach something above you, use a strong step stool that has a grab bar.  Keep electrical cords out of the way.  Do not use floor polish or wax that makes floors slippery. If you must use wax, use non-skid floor wax.  Do not have throw rugs and other things on the floor that can make you trip. What can I do with my stairs?  Do not leave any items on the stairs.  Make sure that there are handrails on both sides of the stairs and use them. Fix handrails that are broken or loose. Make sure that handrails are as long as the stairways.  Check any carpeting to make sure that it is firmly attached to the stairs. Fix any carpet that is loose or worn.  Avoid having throw rugs at the top or bottom of the stairs. If you do have throw rugs,  attach them to the floor with carpet tape.  Make sure that you have a light switch at the top of the stairs and the bottom of the stairs. If you do not have them, ask someone to add them for you. What else can I do to help prevent falls?  Wear shoes that: ? Do not have high heels. ? Have rubber bottoms. ? Are comfortable and fit you well. ? Are closed at the toe. Do not wear sandals.  If you use a stepladder: ? Make sure that it is fully opened. Do not climb a closed stepladder. ? Make sure that both sides of the stepladder are locked into place. ? Ask someone to hold it for you, if possible.  Clearly mark and make sure that you can see: ? Any grab bars or handrails. ? First and last steps. ? Where the edge of each step is.  Use tools that help you move around (mobility aids) if they are needed. These include: ? Canes. ? Walkers. ? Scooters. ? Crutches.  Turn on the lights when you go into a dark area. Replace any light bulbs as soon as they burn out.  Set up your furniture so you have a clear path. Avoid moving your furniture around.  If any of your floors are uneven, fix them.  If there are any pets around you, be aware of where they are.  Review your medicines with your doctor. Some medicines can make you feel dizzy. This can increase your chance of falling. Ask your doctor what other things that you can do to help prevent falls. This information is not intended to replace advice given to you by your health care provider. Make sure you discuss any questions you have with your health care provider. Document Released: 01/02/2009 Document Revised: 08/14/2015 Document Reviewed: 04/12/2014 Elsevier Interactive Patient Education  2018 [place] Maintenance, Female Adopting a healthy lifestyle and getting preventive care can go a long way to promote health and wellness. Talk with your health care provider about what schedule of regular examinations is right for you.  This is a good chance for you to check in with your provider about disease prevention and staying healthy. In between checkups, there are plenty of things you can do on your own. Experts have done a lot of research about which lifestyle changes and preventive measures are most likely to keep you healthy. Ask your health care  provider for more information. Weight and diet Eat a healthy diet  Be sure to include plenty of vegetables, fruits, low-fat dairy products, and lean protein.  Do not eat a lot of foods high in solid fats, added sugars, or salt.  Get regular exercise. This is one of the most important things you can do for your health. ? Most adults should exercise for at least 150 minutes each week. The exercise should increase your heart rate and make you sweat (moderate-intensity exercise). ? Most adults should also do strengthening exercises at least twice a week. This is in addition to the moderate-intensity exercise.  Maintain a healthy weight  Body mass index (BMI) is a measurement that can be used to identify possible weight problems. It estimates body fat based on height and weight. Your health care provider can help determine your BMI and help you achieve or maintain a healthy weight.  For females 89 years of age and older: ? A BMI below 18.5 is considered underweight. ? A BMI of 18.5 to 24.9 is normal. ? A BMI of 25 to 29.9 is considered overweight. ? A BMI of 30 and above is considered obese.  Watch levels of cholesterol and blood lipids  You should start having your blood tested for lipids and cholesterol at 76 years of age, then have this test every 5 years.  You may need to have your cholesterol levels checked more often if: ? Your lipid or cholesterol levels are high. ? You are older than 76 years of age. ? You are at high risk for heart disease.  Cancer screening Lung Cancer  Lung cancer screening is recommended for adults 89-75 years old who are at high risk  for lung cancer because of a history of smoking.  A yearly low-dose CT scan of the lungs is recommended for people who: ? Currently smoke. ? Have quit within the past 15 years. ? Have at least a 30-pack-year history of smoking. A pack year is smoking an average of one pack of cigarettes a day for 1 year.  Yearly screening should continue until it has been 15 years since you quit.  Yearly screening should stop if you develop a health problem that would prevent you from having lung cancer treatment.  Breast Cancer  Practice breast self-awareness. This means understanding how your breasts normally appear and feel.  It also means doing regular breast self-exams. Let your health care provider know about any changes, no matter how small.  If you are in your 20s or 30s, you should have a clinical breast exam (CBE) by a health care provider every 1-3 years as part of a regular health exam.  If you are 70 or older, have a CBE every year. Also consider having a breast X-ray (mammogram) every year.  If you have a family history of breast cancer, talk to your health care provider about genetic screening.  If you are at high risk for breast cancer, talk to your health care provider about having an MRI and a mammogram every year.  Breast cancer gene (BRCA) assessment is recommended for women who have family members with BRCA-related cancers. BRCA-related cancers include: ? Breast. ? Ovarian. ? Tubal. ? Peritoneal cancers.  Results of the assessment will determine the need for genetic counseling and BRCA1 and BRCA2 testing.  Cervical Cancer Your health care provider may recommend that you be screened regularly for cancer of the pelvic organs (ovaries, uterus, and vagina). This screening involves a pelvic examination, including  checking for microscopic changes to the surface of your cervix (Pap test). You may be encouraged to have this screening done every 3 years, beginning at age 71.  For women  ages 43-65, health care providers may recommend pelvic exams and Pap testing every 3 years, or they may recommend the Pap and pelvic exam, combined with testing for human papilloma virus (HPV), every 5 years. Some types of HPV increase your risk of cervical cancer. Testing for HPV may also be done on women of any age with unclear Pap test results.  Other health care providers may not recommend any screening for nonpregnant women who are considered low risk for pelvic cancer and who do not have symptoms. Ask your health care provider if a screening pelvic exam is right for you.  If you have had past treatment for cervical cancer or a condition that could lead to cancer, you need Pap tests and screening for cancer for at least 20 years after your treatment. If Pap tests have been discontinued, your risk factors (such as having a new sexual partner) need to be reassessed to determine if screening should resume. Some women have medical problems that increase the chance of getting cervical cancer. In these cases, your health care provider may recommend more frequent screening and Pap tests.  Colorectal Cancer  This type of cancer can be detected and often prevented.  Routine colorectal cancer screening usually begins at 76 years of age and continues through 76 years of age.  Your health care provider may recommend screening at an earlier age if you have risk factors for colon cancer.  Your health care provider may also recommend using home test kits to check for hidden blood in the stool.  A small camera at the end of a tube can be used to examine your colon directly (sigmoidoscopy or colonoscopy). This is done to check for the earliest forms of colorectal cancer.  Routine screening usually begins at age 7.  Direct examination of the colon should be repeated every 5-10 years through 76 years of age. However, you may need to be screened more often if early forms of precancerous polyps or small growths  are found.  Skin Cancer  Check your skin from head to toe regularly.  Tell your health care provider about any new moles or changes in moles, especially if there is a change in a mole's shape or color.  Also tell your health care provider if you have a mole that is larger than the size of a pencil eraser.  Always use sunscreen. Apply sunscreen liberally and repeatedly throughout the day.  Protect yourself by wearing long sleeves, pants, a wide-brimmed hat, and sunglasses whenever you are outside.  Heart disease, diabetes, and high blood pressure  High blood pressure causes heart disease and increases the risk of stroke. High blood pressure is more likely to develop in: ? People who have blood pressure in the high end of the normal range (130-139/85-89 mm Hg). ? People who are overweight or obese. ? People who are African American.  If you are 2-69 years of age, have your blood pressure checked every 3-5 years. If you are 45 years of age or older, have your blood pressure checked every year. You should have your blood pressure measured twice-once when you are at a hospital or clinic, and once when you are not at a hospital or clinic. Record the average of the two measurements. To check your blood pressure when you are not at  a hospital or clinic, you can use: ? An automated blood pressure machine at a pharmacy. ? A home blood pressure monitor.  If you are between 23 years and 90 years old, ask your health care provider if you should take aspirin to prevent strokes.  Have regular diabetes screenings. This involves taking a blood sample to check your fasting blood sugar level. ? If you are at a normal weight and have a low risk for diabetes, have this test once every three years after 76 years of age. ? If you are overweight and have a high risk for diabetes, consider being tested at a younger age or more often. Preventing infection Hepatitis B  If you have a higher risk for hepatitis  B, you should be screened for this virus. You are considered at high risk for hepatitis B if: ? You were born in a country where hepatitis B is common. Ask your health care provider which countries are considered high risk. ? Your parents were born in a high-risk country, and you have not been immunized against hepatitis B (hepatitis B vaccine). ? You have HIV or AIDS. ? You use needles to inject street drugs. ? You live with someone who has hepatitis B. ? You have had sex with someone who has hepatitis B. ? You get hemodialysis treatment. ? You take certain medicines for conditions, including cancer, organ transplantation, and autoimmune conditions.  Hepatitis C  Blood testing is recommended for: ? Everyone born from [birth year] through 1964. ? Anyone with known risk factors for hepatitis C.  Sexually transmitted infections (STIs)  You should be screened for sexually transmitted infections (STIs) including gonorrhea and chlamydia if: ? You are sexually active and are younger than 76 years of age. ? You are older than 76 years of age and your health care provider tells you that you are at risk for this type of infection. ? Your sexual activity has changed since you were last screened and you are at an increased risk for chlamydia or gonorrhea. Ask your health care provider if you are at risk.  If you do not have HIV, but are at risk, it may be recommended that you take a prescription medicine daily to prevent HIV infection. This is called pre-exposure prophylaxis (PrEP). You are considered at risk if: ? You are sexually active and do not regularly use condoms or know the HIV status of your partner(s). ? You take drugs by injection. ? You are sexually active with a partner who has HIV.  Talk with your health care provider about whether you are at high risk of being infected with HIV. If you choose to begin PrEP, you should first be tested for HIV. You should then be tested every 3 months for as  long as you are taking PrEP. Pregnancy  If you are premenopausal and you may become pregnant, ask your health care provider about preconception counseling.  If you may become pregnant, take 400 to 800 micrograms (mcg) of folic acid every day.  If you want to prevent pregnancy, talk to your health care provider about birth control (contraception). Osteoporosis and menopause  Osteoporosis is a disease in which the bones lose minerals and strength with aging. This can result in serious bone fractures. Your risk for osteoporosis can be identified using a bone density scan.  If you are 38 years of age or older, or if you are at risk for osteoporosis and fractures, ask your health care provider if you should be  screened.  Ask your health care provider whether you should take a calcium or vitamin D supplement to lower your risk for osteoporosis.  Menopause may have certain physical symptoms and risks.  Hormone replacement therapy may reduce some of these symptoms and risks. Talk to your health care provider about whether hormone replacement therapy is right for you. Follow these instructions at home:  Schedule regular health, dental, and eye exams.  Stay current with your immunizations.  Do not use any tobacco products including cigarettes, chewing tobacco, or electronic cigarettes.  If you are pregnant, do not drink alcohol.  If you are breastfeeding, limit how much and how often you drink alcohol.  Limit alcohol intake to no more than 1 drink per day for nonpregnant women. One drink equals 12 ounces of beer, 5 ounces of wine, or 1 ounces of hard liquor.  Do not use street drugs.  Do not share needles.  Ask your health care provider for help if you need support or information about quitting drugs.  Tell your health care provider if you often feel depressed.  Tell your health care provider if you have ever been abused or do not feel safe at home. This information is not intended  to replace advice given to you by your health care provider. Make sure you discuss any questions you have with your health care provider. Document Released: 09/21/2010 Document Revised: 08/14/2015 Document Reviewed: 12/10/2014 Elsevier Interactive Patient Education  [employer].

## 2017-12-09 ENCOUNTER — Other Ambulatory Visit: Payer: Self-pay | Admitting: Family Medicine

## 2017-12-16 ENCOUNTER — Encounter: Payer: Self-pay | Admitting: Family Medicine

## 2017-12-19 ENCOUNTER — Other Ambulatory Visit: Payer: Self-pay

## 2017-12-19 DIAGNOSIS — H02054 Trichiasis without entropian left upper eyelid: Secondary | ICD-10-CM | POA: Diagnosis not present

## 2017-12-19 DIAGNOSIS — H0289 Other specified disorders of eyelid: Secondary | ICD-10-CM

## 2017-12-19 NOTE — Telephone Encounter (Unsigned)
Copied from [place] [id number]. Topic: General - Other >> Dec 16, 2017 12:33 PM Burchel, Abbi R wrote: Pt states she just realized she needs a referral/PA for an appt she has on Monday 9/30 with [facility].  Please call pt to advsie.   [phone] >> Dec 16, 2017  2:28 PM Oliver Pila B wrote: Pt is asking for Roselyn Reef to see what status she is looking @ w/ the referral; pt was made aware pcp had a half day so pt is wondering if the referral will have to wait until Monday; contact to advise >> Dec 19, 2017  8:57 AM Vernona Rieger wrote: Patient states the eye doctor never received the referral for Dr. Georjean Mode, [facility], can that be re sent. Her appointment is at 10 am.  >> Dec 19, 2017  8:58 AM Ahmed Prima L wrote: Patient contacted Dr. Georjean Mode, [facility] this morning and that is what they told the patient. I see it was sent at 8:12 am.

## 2017-12-23 ENCOUNTER — Encounter: Payer: Self-pay | Admitting: Family Medicine

## 2017-12-23 ENCOUNTER — Ambulatory Visit (INDEPENDENT_AMBULATORY_CARE_PROVIDER_SITE_OTHER): Payer: Medicare Other | Admitting: Family Medicine

## 2017-12-23 VITALS — BP 138/74 | HR 61 | Temp 97.8°F | Ht 65.5 in | Wt 123.4 lb

## 2017-12-23 DIAGNOSIS — E559 Vitamin D deficiency, unspecified: Secondary | ICD-10-CM | POA: Diagnosis not present

## 2017-12-23 DIAGNOSIS — I700 Atherosclerosis of aorta: Secondary | ICD-10-CM

## 2017-12-23 DIAGNOSIS — K2270 Barrett's esophagus without dysplasia: Secondary | ICD-10-CM | POA: Diagnosis not present

## 2017-12-23 DIAGNOSIS — Z0000 Encounter for general adult medical examination without abnormal findings: Secondary | ICD-10-CM

## 2017-12-23 DIAGNOSIS — Z23 Encounter for immunization: Secondary | ICD-10-CM

## 2017-12-23 DIAGNOSIS — E785 Hyperlipidemia, unspecified: Secondary | ICD-10-CM

## 2017-12-23 DIAGNOSIS — M1712 Unilateral primary osteoarthritis, left knee: Secondary | ICD-10-CM

## 2017-12-23 DIAGNOSIS — I10 Essential (primary) hypertension: Secondary | ICD-10-CM | POA: Diagnosis not present

## 2017-12-23 DIAGNOSIS — K219 Gastro-esophageal reflux disease without esophagitis: Secondary | ICD-10-CM

## 2017-12-23 LAB — VITAMIN D 25 HYDROXY (VIT D DEFICIENCY, FRACTURES): VITD: 45.88 ng/mL (ref 30.00–100.00)

## 2017-12-23 LAB — COMPREHENSIVE METABOLIC PANEL
ALT: 15 U/L (ref 0–35)
AST: 20 U/L (ref 0–37)
Albumin: 4.4 g/dL (ref 3.5–5.2)
Alkaline Phosphatase: 45 U/L (ref 39–117)
BUN: 29 mg/dL — ABNORMAL HIGH (ref 6–23)
CO2: 30 meq/L (ref 19–32)
Calcium: 10 mg/dL (ref 8.4–10.5)
Chloride: 103 mEq/L (ref 96–112)
Creatinine, Ser: 0.73 mg/dL (ref 0.40–1.20)
GFR: 82.43 mL/min (ref >60.00)
GLUCOSE: 70 mg/dL (ref 70–99)
POTASSIUM: 4 meq/L (ref 3.5–5.1)
Sodium: 140 mEq/L (ref 135–145)
Total Bilirubin: 0.4 mg/dL (ref 0.2–1.2)
Total Protein: 8.2 g/dL (ref 6.0–8.3)

## 2017-12-23 LAB — CBC
HEMATOCRIT: 38.2 % (ref 36.0–46.0)
Hemoglobin: 12.4 g/dL (ref 12.0–15.0)
MCHC: 32.4 g/dL (ref 30.0–36.0)
MCV: 85.6 fl (ref 78.0–100.0)
Platelets: 335 10*3/uL (ref 150.0–400.0)
RBC: 4.47 Mil/uL (ref 3.87–5.11)
RDW: 14.1 % (ref 11.5–15.5)
WBC: 5.7 10*3/uL (ref 4.0–10.5)

## 2017-12-23 LAB — LIPID PANEL
CHOL/HDL RATIO: 2
Cholesterol: 144 mg/dL (ref 0–200)
HDL: 60.2 mg/dL (ref >39.00)
LDL Cholesterol: 76 mg/dL (ref 0–99)
NONHDL: 83.32
Triglycerides: 37 mg/dL (ref 0.0–149.0)
VLDL: 7.4 mg/dL (ref 0.0–40.0)

## 2017-12-23 NOTE — Patient Instructions (Addendum)
Health Maintenance Due  Topic Date Due  . INFLUENZA VACCINE - flu shot today- thanks for doing this 10/20/2017   Please stop by lab before you go  3-6 month follow up   let me know if not doing better within about a month or symptoms worsen- consider bump in pantoprazole short term

## 2017-12-23 NOTE — Addendum Note (Signed)
Addended by: Lyndle Herrlich on: 12/23/2017 08:59 AM   Modules accepted: Orders

## 2017-12-23 NOTE — Progress Notes (Signed)
Phone: [phone]  Subjective:  Patient presents today for their annual physical. Chief complaint-noted.   See problem oriented charting- ROS- full  review of systems was completed and negative except for: reflux symptoms at times, mouth sores, post nasal drip, sneezing, bruises easily   The following were reviewed and entered/updated in epic: Past Medical History:  Diagnosis Date  . Allergy   . Anemia    past hx of anemia  . Aneurysm ([place])    pseudo-aneurym of carotid arteries per pt  . Arthritis    knee  . Barrett's esophagus   . Cataract    bilateral   . 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 Eczema   . GERD (gastroesophageal reflux disease)   . Headache(784.0)   . History of IBS   . Hyperlipidemia    on medication  . Hypertension   . Low back pain   . Meniere disease   . 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 Neuromuscular disorder (HCC)    Hiatal Hernia, Raynauds disease  . Osteopenia   . 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HCC)   . 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 Tubular adenoma of colon 02/2013  . Varicose veins with inflammation   . Vasculitis [facility])    Patient Active Problem List   Diagnosis Date Noted  . Osteoarthritis of left knee 09/15/2017    Priority: Medium  . 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 Hypertension 11/29/2013    Priority: Medium  . Hyperlipidemia 11/29/2013    Priority: Medium  . Barrett's esophagus 08/24/2013    Priority: Medium  . Osteoporosis 09/22/2006    Priority: Medium  . Aortic atherosclerosis ([place]) 02/11/2016    Priority: Low  . Eczema 11/29/2013    Priority: Low  . GERD (gastroesophageal reflux disease) 05/16/2013    Priority: Low  . Vitamin D deficiency 01/29/2012    Priority: Low  . Solitary pulmonary nodule 06/08/2011    Priority: Low  . HIATAL HERNIA WITH REFLUX 02/06/2010    Priority: Low  . DEGENERATIVE JOINT DISEASE, KNEE  03/14/2008    Priority: Low  . VARICOSE VEINS LOWER EXTREMITIES W/INFLAMMATION 03/09/2007    Priority: Low  . Allergic rhinitis 03/09/2007    Priority: Low  . ACTINIC KERATOSIS, FOREHEAD, LEFT 03/09/2007    Priority: Low  . Headache(784.0) 03/09/2007    Priority: Low  . MENIERE'S DISEASE 09/22/2006    Priority: Low  . RAYNAUD'S DISEASE 09/22/2006    Priority: Low   Past Surgical History:  Procedure Laterality Date  . arthroscopic knee     left knee/ torn meniscus  . BUNIONECTOMY Right 1998   with other foot surgery   . carotid artery disection  1988   Carotid Artery Dissection  . CESAREAN SECTION     1 time  . COLONOSCOPY    . DILATION AND CURETTAGE OF UTERUS    . POLYPECTOMY    . POPLITEAL SYNOVIAL CYST EXCISION     left leg  . TONSILLECTOMY      Family History  Problem Relation Age of Onset  . Multiple myeloma Father   . Kidney disease Brother        cancer- removed  . Prostate cancer Brother   . Thyroid cancer Sister        s/p removal. papilary and anaplastic.   . Lung cancer Sister   . Alzheimer's disease Brother  older broterh  . Stroke Brother        88-older brother  . Colon cancer Neg Hx   . Esophageal cancer Neg Hx   . Rectal cancer Neg Hx   . Stomach cancer Neg Hx   . Colon polyps Neg Hx     Medications- reviewed and updated Current Outpatient Medications  Medication Sig Dispense Refill  . amLODipine (NORVASC) 2.5 MG tablet TAKE 1 TABLET BY MOUTH EVERY DAY 30 tablet 5  . aspirin 81 MG tablet Take 81 mg by mouth daily.      . fenofibrate 160 MG tablet TAKE 1 TABLET BY MOUTH EVERY DAY 90 tablet 2  . loratadine (CLARITIN) 10 MG tablet Take 10 mg by mouth daily. Take 1/2 tablet daily.    . metoprolol tartrate (LOPRESSOR) 50 MG tablet TAKE 1/2 TABLET BY MOUTH 2 TIMES DAILY 90 tablet 1  . montelukast (SINGULAIR) 10 MG tablet TAKE 1 TABLET BY MOUTH AT BEDTIME 30 tablet 5  . pantoprazole (PROTONIX) 40 MG tablet TAKE 1 TABLET BY MOUTH EVERY DAY 30  tablet 5  . Polyethyl Glycol-Propyl Glycol (SYSTANE) 0.4-0.3 % SOLN Apply 1 drop to eye daily as needed.    . Vitamin D, Cholecalciferol, 1000 units TABS Take by mouth.     No current facility-administered medications for this visit.     Allergies-reviewed and updated Allergies  Allergen Reactions  . Hydrocodone-Acetaminophen     VOMITING  . Azithromycin Other (See Comments)    Thrush   . Neomycin   . Penicillins     REACTION: Arm swelling  . Pneumococcal Vaccine Polyvalent     ARM REDNESS WITH TENDERNESS  . Pneumovax [Pneumococcal Polysaccharide Vaccine]     Social History   Social History Narrative   Lives with husband who is also a patient of Dr. Yong Channel    Objective: BP 138/74   Pulse 61   Temp 97.8 F (36.6 C) (Oral)   Ht 5' 5.5" (1.664 m)   Wt 123 lb 6.4 oz (56 kg)   SpO2 96%   BMI 20.22 kg/m  Gen: NAD, resting comfortably HEENT: Mucous membranes are moist. Pharynx with mild erythema and light amounts of drainage Neck: no thyromegaly CV: RRR no murmurs rubs or gallops Lungs: CTAB no crackles, wheeze, rhonchi Abdomen: soft/nontender/nondistended/normal bowel sounds. No rebound or guarding.  Ext: no edema Skin: warm, dry Neuro: grossly normal, moves all extremities, PERRLA  Assessment/Plan:  77 y.o. female presenting for annual physical.  Health Maintenance counseling: 1. Anticipatory guidance: Patient counseled regarding regular dental exams -q6 months, eye exams -yearly-has followed more frequently due to blepharitis, wearing seatbelts.  2. Risk factor reduction:  Advised patient of need for regular exercise and diet rich and fruits and vegetables to reduce risk of heart attack and stroke. Exercise- walking most days with dog but wants to walk on her own due to start stop. Diet-has eaten some things that have triggered reflux and now symptoms worse so not eating as well- could contribute to weight loss.  Wt Readings from Last 3 Encounters:  12/23/17 123 lb  6.4 oz (56 kg)  11/17/17 127 lb (57.6 kg)  09/26/17 129 lb (58.5 kg)  3. Immunizations/screenings/ancillary studies- flu shot today. shingrix - wants to hold off. Advised prevnar 13- declines until next year Immunization History  Administered Date(s) Administered  . Influenza Split 01/04/2012  . Influenza Whole 01/03/2007, 12/08/2007, 12/17/2008, 01/20/2010  . Influenza, High Dose Seasonal PF 01/10/2015, 12/12/2015, 12/29/2016  . Influenza,inj,Quad PF,6+ Mos  01/05/2013, 12/24/2013  . Pneumococcal Polysaccharide-23 02/12/2004  . Td 03/23/2003, 04/04/2013  4. Cervical cancer screening- past age based screening.  No history of normal Pap smear. 5. Breast cancer screening-  mammogram 06/10/2017 6. Colon cancer screening -03/03/2013 with 5-year follow-up-so due this year- may end up being next year  12. Skin cancer screening-has not seen [facility] dermatology in 4 years.  Advised regular sunscreen use. Denies worrisome, changing, or new skin lesions.  8. .Osteoporosis screening at 53- 08/06/2016 with 2-year follow-up due to osteopenia  Status of chronic or acute concerns   With history of fibromuscular dysplasia- we opted to continue aspirin '81mg'$   Osteoarthritis left knee- injection provided in left knee in July.  She reports has been reasonably controlled in regards to pain- seems injectoin was helpful.   Hypertension- on metoprolol 25 mg twice daily and amlodipine 2.5 mg daily.  Controlled on repeat- missed metoprolol this AM so should be better when gets dose in as well.   Allergic rhinitis-compliant with Claritin and singular  GERD with history  Barrett's esophagus-remains on Protonix.  Follows up with GI for endoscopies every 3 years. Reflux has been worse lately over about 4 weeks- some stress and some poor dietary intake in regards to GERD diet.  - she wants to try to work on dietary changes- more [name] water, lowering stress - doesn't want to trial change from pantoprazole '40mg'$  right  now  Hyperlipidemia-excellent control on fenofibrate.  She has firmly declined statins in the past.  She does have aortic atherosclerosis and we discussed importance of risk factor modification.  1 small cold sore in mouth  Return in about 6 months (around 06/24/2018) for follow up- or sooner if needed.  Lab/Order associations: Preventative health care  Essential hypertension  Osteoarthritis of left knee, unspecified osteoarthritis type  Barrett's esophagus without dysplasia  Aortic atherosclerosis (HCC)  Gastroesophageal reflux disease without esophagitis  Hyperlipidemia, unspecified hyperlipidemia type - Plan: CBC, Comprehensive metabolic panel, Lipid panel  Vitamin D deficiency - Plan: VITAMIN D 25 Hydroxy (Vit-D Deficiency, Fractures)  Return precautions advised.  Garret Reddish, MD

## 2017-12-26 ENCOUNTER — Encounter: Payer: Self-pay | Admitting: Family Medicine

## 2018-01-25 ENCOUNTER — Encounter: Payer: Self-pay | Admitting: Family Medicine

## 2018-01-27 ENCOUNTER — Encounter: Payer: Self-pay | Admitting: Family Medicine

## 2018-02-02 DIAGNOSIS — K146 Glossodynia: Secondary | ICD-10-CM | POA: Diagnosis not present

## 2018-02-07 ENCOUNTER — Encounter: Payer: Self-pay | Admitting: Family Medicine

## 2018-02-07 ENCOUNTER — Other Ambulatory Visit: Payer: Self-pay | Admitting: Family Medicine

## 2018-02-08 ENCOUNTER — Other Ambulatory Visit (INDEPENDENT_AMBULATORY_CARE_PROVIDER_SITE_OTHER): Payer: Medicare Other

## 2018-02-08 ENCOUNTER — Other Ambulatory Visit: Payer: Self-pay

## 2018-02-08 DIAGNOSIS — Z79899 Other long term (current) drug therapy: Secondary | ICD-10-CM

## 2018-02-08 LAB — VITAMIN B12: Vitamin B-12: 482 pg/mL (ref 211–911)

## 2018-03-07 ENCOUNTER — Other Ambulatory Visit: Payer: Self-pay | Admitting: Family Medicine

## 2018-03-30 DIAGNOSIS — D1801 Hemangioma of skin and subcutaneous tissue: Secondary | ICD-10-CM | POA: Diagnosis not present

## 2018-03-30 DIAGNOSIS — D225 Melanocytic nevi of trunk: Secondary | ICD-10-CM | POA: Diagnosis not present

## 2018-03-30 DIAGNOSIS — L814 Other melanin hyperpigmentation: Secondary | ICD-10-CM | POA: Diagnosis not present

## 2018-03-30 DIAGNOSIS — L570 Actinic keratosis: Secondary | ICD-10-CM | POA: Diagnosis not present

## 2018-03-30 DIAGNOSIS — L821 Other seborrheic keratosis: Secondary | ICD-10-CM | POA: Diagnosis not present

## 2018-04-07 ENCOUNTER — Encounter: Payer: Self-pay | Admitting: Gastroenterology

## 2018-04-19 ENCOUNTER — Encounter: Payer: Self-pay | Admitting: Gastroenterology

## 2018-04-19 ENCOUNTER — Ambulatory Visit (AMBULATORY_SURGERY_CENTER): Payer: Self-pay

## 2018-04-19 VITALS — Ht 65.0 in | Wt 124.6 lb

## 2018-04-19 DIAGNOSIS — Z86010 Personal history of colonic polyps: Secondary | ICD-10-CM

## 2018-04-19 MED ORDER — NA SULFATE-K SULFATE-MG SULF 17.5-3.13-1.6 GM/177ML PO SOLN: 1.0000 | Freq: Once | ORAL | 0 refills | Status: AC

## 2018-04-19 NOTE — Progress Notes (Signed)
No egg or soy allergy known to patient  No issues with past sedation with any surgeries  or procedures, no intubation problems  No diet pills per patient No home 02 use per patient  No blood thinners per patient  Pt denies issues with constipation  No A fib or A flutter  EMMI video sent to pt's e mail  Pt. declined 

## 2018-04-24 ENCOUNTER — Other Ambulatory Visit: Payer: Self-pay | Admitting: Family Medicine

## 2018-04-25 ENCOUNTER — Ambulatory Visit (INDEPENDENT_AMBULATORY_CARE_PROVIDER_SITE_OTHER): Payer: Medicare Other | Admitting: Family Medicine

## 2018-04-25 ENCOUNTER — Encounter: Payer: Self-pay | Admitting: Family Medicine

## 2018-04-25 VITALS — BP 128/68 | HR 68 | Temp 98.1°F | Ht 65.0 in | Wt 124.2 lb

## 2018-04-25 DIAGNOSIS — K219 Gastro-esophageal reflux disease without esophagitis: Secondary | ICD-10-CM

## 2018-04-25 DIAGNOSIS — E785 Hyperlipidemia, unspecified: Secondary | ICD-10-CM

## 2018-04-25 DIAGNOSIS — I10 Essential (primary) hypertension: Secondary | ICD-10-CM

## 2018-04-25 MED ORDER — MONTELUKAST SODIUM 10 MG PO TABS: 10.0000 mg | ORAL_TABLET | Freq: Every day | ORAL | 5 refills | Status: DC

## 2018-04-25 MED ORDER — PANTOPRAZOLE SODIUM 40 MG PO TBEC: 40.0000 mg | DELAYED_RELEASE_TABLET | Freq: Every day | ORAL | 5 refills | Status: DC

## 2018-04-25 MED ORDER — METOPROLOL TARTRATE 50 MG PO TABS: ORAL_TABLET | ORAL | 1 refills | Status: DC

## 2018-04-25 NOTE — Progress Notes (Signed)
Phone [phone]   Subjective:  Lori [name] is a 78 y.o. year old very pleasant female patient who presents for/with See problem oriented charting ROS- still with some knee pain. No chest pain or shortness of breath reported. Reflux much better lately   Past Medical History-  Patient Active Problem List   Diagnosis Date Noted  . Osteoarthritis of left knee 09/15/2017    Priority: Medium  . 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 Hypertension 11/29/2013    Priority: Medium  . Hyperlipidemia 11/29/2013    Priority: Medium  . Barrett's esophagus 08/24/2013    Priority: Medium  . Osteoporosis 09/22/2006    Priority: Medium  . Aortic atherosclerosis ([place]) 02/11/2016    Priority: Low  . Eczema 11/29/2013    Priority: Low  . GERD (gastroesophageal reflux disease) 05/16/2013    Priority: Low  . Vitamin D deficiency 01/29/2012    Priority: Low  . Solitary pulmonary nodule 06/08/2011    Priority: Low  . HIATAL HERNIA WITH REFLUX 02/06/2010    Priority: Low  . DEGENERATIVE JOINT DISEASE, KNEE 03/14/2008    Priority: Low  . VARICOSE VEINS LOWER EXTREMITIES W/INFLAMMATION 03/09/2007    Priority: Low  . Allergic rhinitis 03/09/2007    Priority: Low  . ACTINIC KERATOSIS, FOREHEAD, LEFT 03/09/2007    Priority: Low  . Headache(784.0) 03/09/2007    Priority: Low  . MENIERE'S DISEASE 09/22/2006    Priority: Low  . RAYNAUD'S DISEASE 09/22/2006    Priority: Low    Medications- reviewed and updated Current Outpatient Medications  Medication Sig Dispense Refill  . amLODipine (NORVASC) 2.5 MG tablet TAKE 1 TABLET BY MOUTH EVERY DAY 90 tablet 2  . aspirin 81 MG tablet Take 81 mg by mouth daily.      . fenofibrate 160 MG tablet TAKE 1 TABLET BY MOUTH EVERY DAY 90 tablet 1  . loratadine (CLARITIN) 10 MG tablet Take 10 mg by mouth daily. Take 1/2 tablet daily.    . metoprolol tartrate (LOPRESSOR) 50 MG tablet TAKE 1/2 TABLET BY MOUTH 2 TIMES DAILY 90 tablet 1  .  montelukast (SINGULAIR) 10 MG tablet TAKE 1 TABLET BY MOUTH AT BEDTIME 30 tablet 5  . pantoprazole (PROTONIX) 40 MG tablet TAKE 1 TABLET BY MOUTH EVERY DAY 30 tablet 5  . Polyethyl Glycol-Propyl Glycol (SYSTANE) 0.4-0.3 % SOLN Apply 1 drop to eye daily as needed.    . Vitamin D, Cholecalciferol, 1000 units TABS Take by mouth.       Objective:  BP 128/68   Pulse 68   Temp 98.1 F (36.7 C) (Oral)   Ht 5\' 5"  (1.651 m)   Wt 124 lb 3.2 oz (56.3 kg)   SpO2 97%   BMI 20.67 kg/m  Gen: NAD, resting comfortably CV: RRR no murmurs rubs or gallops Lungs: CTAB no crackles, wheeze, rhonchi Abdomen: soft/nontender/nondistended/normal bowel sounds. Ext: no edema Skin: warm, dry    Assessment and Plan   # Hypertension S:compliant with amlodipine 2.5mg , metoprolol 25 mg BID.   A/P: Stable. Continue current medications.    # GERD S:Compliant with protonix 40mg  daily- doing really well. has been using [employer] coffee (can only get through [facility]) decaf and acid free and she states acid reflux is much improved . 20 kcups is $16.  A/P: much improved with home remedy in addition to protonix- continue current rx   # Hyperlipidemia S:lipids have been reasonably controlled on fenofibrate.  LDL goal at least under 100 and  triglyceride goal under 200.  A/P: Stable. Continue current medications.   Other notes: 1.still with some burning tongue. RX from Dr. Redmond Baseman she didn't like- actually hurt her mouth so just tolerates. Using a [employer] and thinks helping with this and reflux.  2. Wears compression stockings for varicose veins.  3. Still with hot flashes  Future Appointments  Date Time Provider [place]  05/03/2018  8:00 AM Ladene Artist, MD [facility]   Meds ordered this encounter  Medications  . pantoprazole (PROTONIX) 40 MG tablet    Sig: Take 1 tablet (40 mg total) by mouth daily.    Dispense:  30 tablet    Refill:  5    This prescription was filled on 12/09/2017. Any  refills authorized will be placed on file.  . montelukast (SINGULAIR) 10 MG tablet    Sig: Take 1 tablet (10 mg total) by mouth at bedtime.    Dispense:  30 tablet    Refill:  5    This prescription was filled on 11/10/2017. Any refills authorized will be placed on file.  . metoprolol tartrate (LOPRESSOR) 50 MG tablet    Sig: TAKE 1/2 TABLET BY MOUTH 2 TIMES DAILY    Dispense:  90 tablet    Refill:  1    This prescription was filled on 02/07/2018. Any refills authorized will be placed on file.    Return precautions advised.  Garret Reddish, MD

## 2018-04-25 NOTE — Patient Instructions (Addendum)
Health Maintenance Due  Topic Date Due  . COLONOSCOPY - scheduled in February  03/13/2018   Our team- can provide refills for a year for any need prescriptions they will update that before you go  Schedule physical October 4th or later and if not doing well before then please schedule to see me sooner but im thrilled with how well you have been doing lately.

## 2018-05-03 ENCOUNTER — Ambulatory Visit (AMBULATORY_SURGERY_CENTER): Payer: Medicare Other | Admitting: Gastroenterology

## 2018-05-03 ENCOUNTER — Telehealth: Payer: Self-pay | Admitting: Gastroenterology

## 2018-05-03 ENCOUNTER — Encounter: Payer: Self-pay | Admitting: Gastroenterology

## 2018-05-03 VITALS — BP 114/61 | HR 56 | Temp 97.8°F | Resp 12 | Ht 65.0 in | Wt 124.0 lb

## 2018-05-03 DIAGNOSIS — D129 Benign neoplasm of anus and anal canal: Secondary | ICD-10-CM

## 2018-05-03 DIAGNOSIS — D128 Benign neoplasm of rectum: Secondary | ICD-10-CM

## 2018-05-03 DIAGNOSIS — Z86010 Personal history of colonic polyps: Secondary | ICD-10-CM

## 2018-05-03 DIAGNOSIS — K621 Rectal polyp: Secondary | ICD-10-CM | POA: Diagnosis not present

## 2018-05-03 DIAGNOSIS — K5140 Inflammatory polyps of colon without complications: Secondary | ICD-10-CM | POA: Diagnosis not present

## 2018-05-03 DIAGNOSIS — D120 Benign neoplasm of cecum: Secondary | ICD-10-CM | POA: Diagnosis not present

## 2018-05-03 MED ORDER — SODIUM CHLORIDE 0.9 % IV SOLN: 500.0000 mL | Freq: Once | INTRAVENOUS | Status: DC

## 2018-05-03 NOTE — Patient Instructions (Signed)
Information on polyps given to you today.  Await pathology results.  No aspirin, naproxen, ibuprofen, or other non steroidal anti inflammatory medications for two weeks after colonoscopy today.YOU HAD AN ENDOSCOPIC PROCEDURE TODAY AT [facility]:   Refer to the procedure report that was given to you for any specific questions about what was found during the examination.  If the procedure report does not answer your questions, please call your gastroenterologist to clarify.  If you requested that your care partner not be given the details of your procedure findings, then the procedure report has been included in a sealed envelope for you to review at your convenience later.  YOU SHOULD EXPECT: Some feelings of bloating in the abdomen. Passage of more gas than usual.  Walking can help get rid of the air that was put into your GI tract during the procedure and reduce the bloating. If you had a lower endoscopy (such as a colonoscopy or flexible sigmoidoscopy) you may notice spotting of blood in your stool or on the toilet paper. If you underwent a bowel prep for your procedure, you may not have a normal bowel movement for a few days.  Please Note:  You might notice some irritation and congestion in your nose or some drainage.  This is from the oxygen used during your procedure.  There is no need for concern and it should clear up in a day or so.  SYMPTOMS TO REPORT IMMEDIATELY:   Following lower endoscopy (colonoscopy or flexible sigmoidoscopy):  Excessive amounts of blood in the stool  Significant tenderness or worsening of abdominal pains  Swelling of the abdomen that is new, acute  Fever of 100F or higher   For urgent or emergent issues, a gastroenterologist can be reached at any hour by calling [phone].   DIET:  We do recommend a small meal at first, but then you may proceed to your regular diet.  Drink plenty of fluids but you should avoid alcoholic beverages for 24  hours.  ACTIVITY:  You should plan to take it easy for the rest of today and you should NOT DRIVE or use heavy machinery until tomorrow (because of the sedation medicines used during the test).    FOLLOW UP: Our staff will call the number listed on your records the next business day following your procedure to check on you and address any questions or concerns that you may have regarding the information given to you following your procedure. If we do not reach you, we will leave a message.  However, if you are feeling well and you are not experiencing any problems, there is no need to return our call.  We will assume that you have returned to your regular daily activities without incident.  If any biopsies were taken you will be contacted by phone or by letter within the next 1-3 weeks.  Please call us at [phone] if you have not heard about the biopsies in 3 weeks.    SIGNATURES/CONFIDENTIALITY: You and/or your care partner have signed paperwork which will be entered into your electronic medical record.  These signatures attest to the fact that that the information above on your After Visit Summary has been reviewed and is understood.  Full responsibility of the confidentiality of this discharge information lies with you and/or your care-partner.

## 2018-05-03 NOTE — Telephone Encounter (Signed)
Pt called and stated once she got home she is bleeding really bad. And want to speak with the nurse with her concerns.

## 2018-05-03 NOTE — Progress Notes (Signed)
Called to room to assist during endoscopic procedure.  Patient ID and intended procedure confirmed with present staff. Received instructions for my participation in the procedure from the performing physician.  

## 2018-05-03 NOTE — Telephone Encounter (Signed)
Called Patient back after reading message that she was bleeding after her colonoscopy this morning. She stated that after she got home she started passing darker red blood clots and bleeding heavy when she tried to go to the bathroom. She then put on a depends and said after trying to lay down for a while she stood up to go to the bathroom and more bright red blood came out into the depends. She referenced it being similar to having her period. I talked to Dr. Fuller Plan and he said if it was a couple of spoonfuls or less to try clear liquids and bedrest for the remainder of the day. If it was more blood than that,  she would need to report to the ED. I explained this to her and her husband via speaker phone. They both understood and agreed. At the end of our conversation, she felt the need to go to the bathroom again so I encouraged her to go while I was on the phone so she could describe it to me. She went to the restroom and came back and said that it was much less. She said she thinks it is stopping and wants to wait on going to the ED. I told her that was ok as long as she knew that if it started back heavy again she would need to go. She and her husband understand and agree. No further questions or concerns at this time. I explained this all to Dr. Fuller Plan to make him aware. We will check on patient in the morning.

## 2018-05-03 NOTE — Op Note (Signed)
[facility] Patient Name: Lori [name] Procedure Date: 05/03/2018 7:53 AM MRN: [id number] Endoscopist: Ladene Artist , MD Age: 78 Referring MD:  Date of Birth: [birth year] Gender: Female Account #: [id number] Procedure:                Colonoscopy Indications:              Surveillance: Personal history of adenomatous                            polyps on last colonoscopy > 5 years ago Medicines:                Monitored Anesthesia Care Procedure:                Pre-Anesthesia Assessment:                           - Prior to the procedure, a History and Physical                            was performed, and patient medications and                            allergies were reviewed. The patient's tolerance of                            previous anesthesia was also reviewed. The risks                            and benefits of the procedure and the sedation                            options and risks were discussed with the patient.                            All questions were answered, and informed consent                            was obtained. Prior Anticoagulants: The patient has                            taken no previous anticoagulant or antiplatelet                            agents. ASA Grade Assessment: II - A patient with                            mild systemic disease. After reviewing the risks                            and benefits, the patient was deemed in                            satisfactory condition to undergo the procedure.  After obtaining informed consent, the colonoscope                            was passed under direct vision. Throughout the                            procedure, the patient's blood pressure, pulse, and                            oxygen saturations were monitored continuously. The                            Colonoscope was introduced through the anus and                            advanced to the the  cecum, identified by                            appendiceal orifice and ileocecal valve. The                            ileocecal valve, appendiceal orifice, and rectum                            were photographed. The quality of the bowel                            preparation was good. The colonoscopy was performed                            without difficulty. The patient tolerated the                            procedure well. Scope In: 8:03:36 AM Scope Out: 8:19:39 AM Scope Withdrawal Time: 0 hours 13 minutes 32 seconds  Total Procedure Duration: 0 hours 16 minutes 3 seconds  Findings:                 The perianal and digital rectal examinations were                            normal.                           A 8 mm polyp was found in the rectum. The polyp was                            semi-pedunculated. The polyp was removed with a                            cold snare. Resection and retrieval were complete.                           A 4 mm polyp was found in the cecum. The polyp was  sessile. The polyp was removed with a cold biopsy                            forceps. Resection and retrieval were complete.                           Internal hemorrhoids were found during                            retroflexion. The hemorrhoids were small and Grade                            I (internal hemorrhoids that do not prolapse).                           The exam was otherwise without abnormality on                            direct and retroflexion views. Complications:            No immediate complications. Estimated blood loss:                            None. Estimated Blood Loss:     Estimated blood loss: none. Impression:               - One 8 mm polyp in the rectum, removed with a cold                            snare. Resected and retrieved.                           - One 4 mm polyp in the cecum, removed with a cold                            biopsy  forceps. Resected and retrieved.                           - Internal hemorrhoids.                           - The examination was otherwise normal on direct                            and retroflexion views. Recommendation:           - Patient has a contact number available for                            emergencies. The signs and symptoms of potential                            delayed complications were discussed with the                            patient. Return to  normal activities tomorrow.                            Written discharge instructions were provided to the                            patient.                           - Resume previous diet.                           - Continue present medications.                           - Await pathology results.                           - No aspirin, ibuprofen, naproxen, or other                            non-steroidal anti-inflammatory drugs for 2 weeks                            after polyp removal.                           - No repeat colonoscopy due to age. Ladene Artist, MD 05/03/2018 8:22:43 AM This report has been signed electronically.

## 2018-05-03 NOTE — Progress Notes (Signed)
Report given to PACU, vss 

## 2018-05-04 ENCOUNTER — Telehealth: Payer: Self-pay | Admitting: *Deleted

## 2018-05-04 NOTE — Telephone Encounter (Signed)
  Follow up Call-  Call back number 05/03/2018 10/07/2016  Post procedure Call Back phone  # [phone]  Permission to leave phone message Yes Yes  Some recent data might be hidden     Patient questions:  Do you have a fever, pain , or abdominal swelling? No. Pain Score  0 *  Have you tolerated food without any problems? Yes.    Have you been able to return to your normal activities? Yes.    Do you have any questions about your discharge instructions: Diet   No. Medications  No. Follow up visit  No.  Do you have questions or concerns about your Care? No.  Actions: * If pain score is 4 or above: No action needed, pain <4.   Pt states that she had no more bleeding since 1730 on 05-03-18.

## 2018-05-04 NOTE — Telephone Encounter (Signed)
First follow up call attempt.  Reached patient's voicemail with name identified.  Left message for her to call back if she is still having bleeding issues.  Will attempt to call again this afternoon.

## 2018-05-16 ENCOUNTER — Other Ambulatory Visit: Payer: Self-pay | Admitting: Family Medicine

## 2018-05-25 ENCOUNTER — Encounter: Payer: Self-pay | Admitting: Gastroenterology

## 2018-06-05 ENCOUNTER — Other Ambulatory Visit: Payer: Self-pay | Admitting: Family Medicine

## 2018-07-26 ENCOUNTER — Other Ambulatory Visit: Payer: Self-pay | Admitting: Family Medicine

## 2018-09-04 ENCOUNTER — Other Ambulatory Visit: Payer: Self-pay | Admitting: Family Medicine

## 2018-09-15 DIAGNOSIS — Z803 Family history of malignant neoplasm of breast: Secondary | ICD-10-CM | POA: Diagnosis not present

## 2018-09-15 DIAGNOSIS — Z1231 Encounter for screening mammogram for malignant neoplasm of breast: Secondary | ICD-10-CM | POA: Diagnosis not present

## 2018-09-15 LAB — HM MAMMOGRAPHY

## 2018-09-20 ENCOUNTER — Encounter: Payer: Self-pay | Admitting: Family Medicine

## 2018-11-08 ENCOUNTER — Other Ambulatory Visit: Payer: Self-pay | Admitting: Family Medicine

## 2018-11-09 DIAGNOSIS — L720 Epidermal cyst: Secondary | ICD-10-CM | POA: Diagnosis not present

## 2018-11-09 DIAGNOSIS — L821 Other seborrheic keratosis: Secondary | ICD-10-CM | POA: Diagnosis not present

## 2018-11-09 DIAGNOSIS — L570 Actinic keratosis: Secondary | ICD-10-CM | POA: Diagnosis not present

## 2018-11-09 DIAGNOSIS — S20361A Insect bite (nonvenomous) of right front wall of thorax, initial encounter: Secondary | ICD-10-CM | POA: Diagnosis not present

## 2018-11-25 ENCOUNTER — Other Ambulatory Visit: Payer: Self-pay | Admitting: Family Medicine

## 2018-12-01 ENCOUNTER — Other Ambulatory Visit: Payer: Self-pay

## 2018-12-01 ENCOUNTER — Ambulatory Visit (INDEPENDENT_AMBULATORY_CARE_PROVIDER_SITE_OTHER): Payer: Medicare Other

## 2018-12-01 VITALS — BP 126/72 | Temp 97.6°F | Ht 65.0 in | Wt 127.8 lb

## 2018-12-01 DIAGNOSIS — Z0000 Encounter for general adult medical examination without abnormal findings: Secondary | ICD-10-CM | POA: Diagnosis not present

## 2018-12-01 DIAGNOSIS — Z23 Encounter for immunization: Secondary | ICD-10-CM

## 2018-12-01 DIAGNOSIS — Z79899 Other long term (current) drug therapy: Secondary | ICD-10-CM | POA: Diagnosis not present

## 2018-12-01 DIAGNOSIS — R5383 Other fatigue: Secondary | ICD-10-CM

## 2018-12-01 NOTE — Progress Notes (Signed)
Subjective:   Lori [name] is a 78 y.o. female who presents for Medicare Annual (Subsequent) preventive examination.  Review of Systems:   Cardiac Risk Factors include: advanced age (>32mn, >>56women)     Objective:     Vitals: BP 126/72 (BP Location: Left Arm, Patient Position: Sitting, Cuff Size: Normal)   Temp 97.6 F (36.4 C) (Temporal)   Ht '5\' 5"'$  (1.651 m)   Wt 127 lb 12.8 oz (58 kg)   BMI 21.27 kg/m   Body mass index is 21.27 kg/m.  Advanced Directives 12/01/2018 11/17/2017 10/22/2016 09/24/2016  Does Patient Have a Medical Advance Directive? Yes Yes Yes Yes  Type of Advance Directive Living will;Healthcare Power of Attorney - - Living will;Healthcare Power of Attorney  Does patient want to make changes to medical advance directive? No - Patient declined - - -  [place] of [place]? No - copy requested - - -    Tobacco Social History   Tobacco Use  Smoking Status Never Smoker  Smokeless Tobacco Never Used     Counseling given: Not Answered   Clinical Intake:  Pre-visit preparation completed: Yes  Pain : No/denies pain     Diabetes: No  How often do you need to have someone help you when you read instructions, pamphlets, or other written materials from your doctor or pharmacy?: 1 - Never  Interpreter Needed?: No  Information entered by :: CDenman GeorgeLPN  Past Medical History:  Diagnosis Date  . Allergy   . Anemia    past hx of anemia  . Aneurysm ([place]    pseudo-aneurym of carotid arteries per pt  . Arthritis    knee  . Barrett's esophagus   . Cataract    bilateral   . 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 Eczema   . GERD (gastroesophageal reflux disease)   . Headache(784.0)   . History of IBS   . Hyperlipidemia    on medication  . Hypertension   . Low back pain   . Meniere disease   . 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 Neuromuscular disorder (HCC)    Hiatal Hernia,  Raynauds disease  . Osteopenia   . 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HCC)   . 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 Tubular adenoma of colon 02/2013  . Varicose veins with inflammation   . Vasculitis ([facility]    Past Surgical History:  Procedure Laterality Date  . arthroscopic knee     left knee/ torn meniscus  . BUNIONECTOMY Right 1998   with other foot surgery   . carotid artery disection  1988   Carotid Artery Dissection  . CESAREAN SECTION     1 time  . COLONOSCOPY    . DILATION AND CURETTAGE OF UTERUS    . POLYPECTOMY    . POPLITEAL SYNOVIAL CYST EXCISION     left leg  . TONSILLECTOMY    . UPPER GASTROINTESTINAL ENDOSCOPY     Family History  Problem Relation Age of Onset  . Multiple myeloma Father   . Kidney disease Brother        cancer- removed  . Prostate cancer Brother   . Thyroid cancer Sister        s/p removal. papilary and anaplastic.   . Lung cancer Sister   . Alzheimer's disease Brother        older  broterh  . Stroke Brother        88-older brother  . Colon cancer Neg Hx   . Esophageal cancer Neg Hx   . Rectal cancer Neg Hx   . Stomach cancer Neg Hx   . Colon polyps Neg Hx    Social History   Socioeconomic History  . Marital status: Married    Spouse name: Not on file  . Number of children: Not on file  . Years of education: Not on file  . Highest education level: Not on file  Occupational History  . Occupation: retired  Scientific laboratory technician  . Financial resource strain: Not on file  . Food insecurity    Worry: Not on file    Inability: Not on file  . Transportation needs    Medical: Not on file    Non-medical: Not on file  Tobacco Use  . Smoking status: Never Smoker  . Smokeless tobacco: Never Used  Substance and Sexual Activity  . Alcohol use: No    Alcohol/week: 0.0 standard drinks  . Drug use: No  . Sexual activity: Yes  Lifestyle  . Physical activity    Days per week: Not on file    Minutes per session: Not  on file  . Stress: Not on file  Relationships  . Social Herbalist on phone: Not on file    Gets together: Not on file    Attends religious service: Not on file    Active member of club or organization: Not on file    Attends meetings of clubs or organizations: Not on file    Relationship status: Not on file  Other Topics Concern  . Not on file  Social History Narrative   Lives with husband who is also a patient of Dr. Yong Channel    Outpatient Encounter Medications as of 12/01/2018  Medication Sig  . amLODipine (NORVASC) 2.5 MG tablet TAKE 1 TABLET BY MOUTH EVERY DAY  . aspirin 81 MG tablet Take 81 mg by mouth daily.    . fenofibrate 160 MG tablet TAKE 1 TABLET BY MOUTH EVERY DAY  . loratadine (CLARITIN) 10 MG tablet Take 10 mg by mouth daily. Take 1/2 tablet daily.  . metoprolol tartrate (LOPRESSOR) 50 MG tablet TAKE 1/2 TABLET BY MOUTH 2 TIMES DAILY  . montelukast (SINGULAIR) 10 MG tablet TAKE 1 TABLET BY MOUTH AT BEDTIME  . pantoprazole (PROTONIX) 40 MG tablet TAKE 1 TABLET BY MOUTH EVERY DAY  . Vitamin D, Cholecalciferol, 1000 units TABS Take by mouth.   No facility-administered encounter medications on file as of 12/01/2018.     Activities of Daily Living In your present state of health, do you have any difficulty performing the following activities: 12/01/2018  Hearing? N  Vision? N  Difficulty concentrating or making decisions? N  Walking or climbing stairs? N  Dressing or bathing? N  Doing errands, shopping? N  Preparing Food and eating ? N  Using the Toilet? N  In the past six months, have you accidently leaked urine? N  Do you have problems with loss of bowel control? N  Managing your Medications? N  Managing your Finances? N  Housekeeping or managing your Housekeeping? N  Some recent data might be hidden    Patient Care Team: [name], MD as PCP - General (Family Medicine) Melida Quitter, MD as Consulting Physician (Otolaryngology) Ladene Artist, MD as Consulting Physician (Gastroenterology) Darleen Crocker, MD as Consulting Physician (Ophthalmology) Harriett Sine,  MD as Consulting Physician (Dermatology)    Assessment:   This is a routine wellness examination for Rima.  Exercise Activities and Dietary recommendations Current Exercise Habits: Home exercise routine, Type of exercise: walking, Time (Minutes): 45, Frequency (Times/Week): 5, Weekly Exercise (Minutes/Week): 225, Intensity: Mild  Goals    . Exercise 150 min/wk Moderate Activity     Will try to get on the exercise bike      . patient     Wants to take of herself  Plan to start walking 2 neighbors   May consider swimming    . patient centered     [place] [facility]/ [place] come/ fall You can check out what we have discussed to increase mobility       Fall Risk Fall Risk  12/01/2018 11/17/2017 12/22/2016 12/12/2015 05/30/2014  Falls in the past year? 0 No No No No  Number falls in past yr: 0 - - - -  Injury with Fall? 0 - - - -  Risk for fall due to : - - - - -  Follow up Falls evaluation completed;Education provided - - - -   Is the patient's home free of loose throw rugs in walkways, pet beds, electrical cords, etc?   yes      Grab bars in the bathroom? yes      Handrails on the stairs?   yes      Adequate lighting?   yes  Timed Get Up and Go performed: completed and within normal timeframe; no gait abnormalities noted    Depression Screen PHQ 2/9 Scores 12/01/2018 04/25/2018 11/17/2017 12/22/2016  PHQ - 2 Score 0 0 0 0  PHQ- 9 Score - 0 - -     Cognitive Function-no cognitive concerns at this time      6CIT Screen 12/01/2018  What Year? 0 points  What month? 0 points  What time? 0 points  Count back from 20 0 points  Months in reverse 0 points  Repeat phrase 0 points  Total Score 0    Immunization History  Administered Date(s) Administered  . Fluad Quad(high Dose 65+) 12/01/2018  . Influenza Split 01/04/2012  . Influenza Whole  01/03/2007, 12/08/2007, 12/17/2008, 01/20/2010  . Influenza, High Dose Seasonal PF 01/10/2015, 12/12/2015, 12/29/2016, 12/23/2017  . Influenza,inj,Quad PF,6+ Mos 01/05/2013, 12/24/2013  . Pneumococcal Polysaccharide-23 02/12/2004  . Td 03/23/2003, 04/04/2013    Qualifies for Shingles Vaccine?Discussed and patient will check with pharmacy for coverage.  Patient education handout provided    Screening Tests Health Maintenance  Topic Date Due  . PNA vac Low Risk Adult (1 of 2 - PCV13) 05/29/2024 (Originally 03/11/2007)  . TETANUS/TDAP  04/05/2023  . INFLUENZA VACCINE  Completed  . DEXA SCAN  Completed    Cancer Screenings: Lung: Low Dose CT Chest recommended if Age 52-80 years, 30 pack-year currently smoking OR have quit w/in 15years. Patient does not qualify. Breast:  Up to date on Mammogram? Yes   Up to date of Bone Density/Dexa? Yes Colorectal: completed 05/03/18 with Dr. Fuller Plan      Plan:    I have personally reviewed and addressed the Medicare Annual Wellness questionnaire and have noted the following in the patient's chart:  A. Medical and social history B. Use of alcohol, tobacco or illicit drugs  C. Current medications and supplements D. Functional ability and status E.  Nutritional status F.  Physical activity G. Advance directives H. List of other physicians I.  Hospitalizations, surgeries, and ER visits in  previous 12 months J.  [place] such as hearing and vision if needed, cognitive and depression L. Referrals, records requested, and appointments- will search for in network eye providers who manage trichiasis   In addition, I have reviewed and discussed with patient certain preventive protocols, quality metrics, and best practice recommendations. A written personalized care plan for preventive services as well as general preventive health recommendations were provided to patient.   Signed,  Denman George, LPN  Nurse Health Advisor   Nurse Notes:  Patient with multiple concerns.  Will like labs ordered next week to check for abnormalities that may be causing fatigue.  She has started walking with a group but is finding herself still fatigued.  Is also having left knee pain that she would like to discuss and additional steroid injection at her next visit.  Also has intermittent tinnitus.

## 2018-12-01 NOTE — Patient Instructions (Signed)
Lori [name] , Thank you for taking time to come for your Medicare Wellness Visit. I appreciate your ongoing commitment to your health goals. Please review the following plan we discussed and let me know if I can assist you in the future.   Screening recommendations/referrals: Colorectal Screening: completed 05/03/18 with Dr. Fuller Plan  Mammogram: completed 09/15/18 Bone Density: up to date; last 08/06/16  Vision and Dental Exams: Recommended annual ophthalmology exams for early detection of glaucoma and other disorders of the eye Recommended annual dental exams for proper oral hygiene  Vaccinations: Influenza vaccine: today Pneumococcal vaccine: recommended  Tdap vaccine: up to date; last 04/04/13 Shingles vaccine: Please call your insurance company to determine your out of pocket expense for the Shingrix vaccine. You may receive this vaccine at your local pharmacy.  Advanced directives: Please bring a copy of your POA (Power of Attorney) and/or Living Will to your next appointment.  Goals: Recommend to drink at least 6-8 8oz glasses of water per day and remove any items from the home that may cause slips or trips.  Next appointment: Please schedule your Annual Wellness Visit with your Nurse Health Advisor in one year.  Preventive Care 77 Years and Older, Female Preventive care refers to lifestyle choices and visits with your health care provider that can promote health and wellness. What does preventive care include?  A yearly physical exam. This is also called an annual well check.  Dental exams once or twice a year.  Routine eye exams. Ask your health care provider how often you should have your eyes checked.  Personal lifestyle choices, including:  Daily care of your teeth and gums.  Regular physical activity.  Eating a healthy diet.  Avoiding tobacco and drug use.  Limiting alcohol use.  Practicing safe sex.  Taking low-dose aspirin every day if recommended by your health  care provider.  Taking vitamin and mineral supplements as recommended by your health care provider. What happens during an annual well check? The services and screenings done by your health care provider during your annual well check will depend on your age, overall health, lifestyle risk factors, and family history of disease. Counseling  Your health care provider may ask you questions about your:  Alcohol use.  Tobacco use.  Drug use.  Emotional well-being.  Home and relationship well-being.  Sexual activity.  Eating habits.  History of falls.  Memory and ability to understand (cognition).  Work and work Statistician.  Reproductive health. Screening  You may have the following tests or measurements:  Height, weight, and BMI.  Blood pressure.  Lipid and cholesterol levels. These may be checked every 5 years, or more frequently if you are over 44 years old.  Skin check.  Lung cancer screening. You may have this screening every year starting at age 77 if you have a 30-pack-year history of smoking and currently smoke or have quit within the past 15 years.  Fecal occult blood test (FOBT) of the stool. You may have this test every year starting at age 28.  Flexible sigmoidoscopy or colonoscopy. You may have a sigmoidoscopy every 5 years or a colonoscopy every 10 years starting at age 77  Hepatitis C blood test.  Hepatitis B blood test.  Sexually transmitted disease (STD) testing.  Diabetes screening. This is done by checking your blood sugar (glucose) after you have not eaten for a while (fasting). You may have this done every 1-3 years.  Bone density scan. This is done to screen for osteoporosis.  You may have this done starting at age 77  Mammogram. This may be done every 1-2 years. Talk to your health care provider about how often you should have regular mammograms. Talk with your health care provider about your test results, treatment options, and if  necessary, the need for more tests. Vaccines  Your health care provider may recommend certain vaccines, such as:  Influenza vaccine. This is recommended every year.  Tetanus, diphtheria, and acellular pertussis (Tdap, Td) vaccine. You may need a Td booster every 10 years.  Zoster vaccine. You may need this after age 77  Pneumococcal 13-valent conjugate (PCV13) vaccine. One dose is recommended after age 77  Pneumococcal polysaccharide (PPSV23) vaccine. One dose is recommended after age 70. Talk to your health care provider about which screenings and vaccines you need and how often you need them. This information is not intended to replace advice given to you by your health care provider. Make sure you discuss any questions you have with your health care provider. Document Released: 04/04/2015 Document Revised: 11/26/2015 Document Reviewed: 01/07/2015 Elsevier Interactive Patient Education  2017 [place] Prevention in the Home Falls can cause injuries. They can happen to people of all ages. There are many things you can do to make your home safe and to help prevent falls. What can I do on the outside of my home?  Regularly fix the edges of walkways and driveways and fix any cracks.  Remove anything that might make you trip as you walk through a door, such as a raised step or threshold.  Trim any bushes or trees on the path to your home.  Use bright outdoor lighting.  Clear any walking paths of anything that might make someone trip, such as rocks or tools.  Regularly check to see if handrails are loose or broken. Make sure that both sides of any steps have handrails.  Any raised decks and porches should have guardrails on the edges.  Have any leaves, snow, or ice cleared regularly.  Use sand or salt on walking paths during winter.  Clean up any spills in your garage right away. This includes oil or grease spills. What can I do in the bathroom?  Use night lights.   Install grab bars by the toilet and in the tub and shower. Do not use towel bars as grab bars.  Use non-skid mats or decals in the tub or shower.  If you need to sit down in the shower, use a plastic, non-slip stool.  Keep the floor dry. Clean up any water that spills on the floor as soon as it happens.  Remove soap buildup in the tub or shower regularly.  Attach bath mats securely with double-sided non-slip rug tape.  Do not have throw rugs and other things on the floor that can make you trip. What can I do in the bedroom?  Use night lights.  Make sure that you have a light by your bed that is easy to reach.  Do not use any sheets or blankets that are too big for your bed. They should not hang down onto the floor.  Have a firm chair that has side arms. You can use this for support while you get dressed.  Do not have throw rugs and other things on the floor that can make you trip. What can I do in the kitchen?  Clean up any spills right away.  Avoid walking on wet floors.  Keep items that you use a  lot in easy-to-reach places.  If you need to reach something above you, use a strong step stool that has a grab bar.  Keep electrical cords out of the way.  Do not use floor polish or wax that makes floors slippery. If you must use wax, use non-skid floor wax.  Do not have throw rugs and other things on the floor that can make you trip. What can I do with my stairs?  Do not leave any items on the stairs.  Make sure that there are handrails on both sides of the stairs and use them. Fix handrails that are broken or loose. Make sure that handrails are as long as the stairways.  Check any carpeting to make sure that it is firmly attached to the stairs. Fix any carpet that is loose or worn.  Avoid having throw rugs at the top or bottom of the stairs. If you do have throw rugs, attach them to the floor with carpet tape.  Make sure that you have a light switch at the top of the  stairs and the bottom of the stairs. If you do not have them, ask someone to add them for you. What else can I do to help prevent falls?  Wear shoes that:  Do not have high heels.  Have rubber bottoms.  Are comfortable and fit you well.  Are closed at the toe. Do not wear sandals.  If you use a stepladder:  Make sure that it is fully opened. Do not climb a closed stepladder.  Make sure that both sides of the stepladder are locked into place.  Ask someone to hold it for you, if possible.  Clearly mark and make sure that you can see:  Any grab bars or handrails.  First and last steps.  Where the edge of each step is.  Use tools that help you move around (mobility aids) if they are needed. These include:  Canes.  Walkers.  Scooters.  Crutches.  Turn on the lights when you go into a dark area. Replace any light bulbs as soon as they burn out.  Set up your furniture so you have a clear path. Avoid moving your furniture around.  If any of your floors are uneven, fix them.  If there are any pets around you, be aware of where they are.  Review your medicines with your doctor. Some medicines can make you feel dizzy. This can increase your chance of falling. Ask your doctor what other things that you can do to help prevent falls. This information is not intended to replace advice given to you by your health care provider. Make sure you discuss any questions you have with your health care provider. Document Released: 01/02/2009 Document Revised: 08/14/2015 Document Reviewed: 04/12/2014 Elsevier Interactive Patient Education  2017 [employer].

## 2018-12-02 NOTE — Progress Notes (Signed)
I have reviewed and agree with note, evaluation, plan.   I have placed lab orders- please help her schedule a lab visit. For tinnitus-likely related to hearing loss-may refer to ENT if she would like [facility] to evaluate knee pain at October visit  Garret Reddish, MD

## 2018-12-02 NOTE — Addendum Note (Signed)
Addended by: [name] on: 12/02/2018 09:18 AM   Modules accepted: Orders

## 2018-12-05 ENCOUNTER — Other Ambulatory Visit: Payer: Self-pay | Admitting: Family Medicine

## 2018-12-25 ENCOUNTER — Encounter: Payer: Self-pay | Admitting: Family Medicine

## 2018-12-25 NOTE — Patient Instructions (Addendum)
Schedule your bone density test at check out desk. You may also call directly to X-ray at [phone] to schedule an appointment that is convenient for you.  - located [address] across the street from [facility] - in the basement - you do need an appointment for the bone density tests.   No changes today- great job with walking!   Please stop by lab before you go If you do not have mychart- we will call you about results within 5 business days of [place] receiving them.  If you have mychart- we will send your results within 3 business days of [place] receiving them.  If abnormal or we want to clarify a result, we will call or mychart you to make sure you receive the message.  If you have questions or concerns or don't hear within 5-7 days, please send [place] a message or call us.

## 2018-12-25 NOTE — Progress Notes (Signed)
Phone: [phone]   Subjective:  Patient presents today for their annual physical. Chief complaint-noted.   See problem oriented charting- ROS- full  review of systems was completed and negative except for: Post nasal drip and seasonal allergies, tinnitus  The following were reviewed and entered/updated in epic: Past Medical History:  Diagnosis Date  . Allergy   . Anemia    past hx of anemia  . Aneurysm ([place])    pseudo-aneurym of carotid arteries per pt  . Arthritis    knee  . Barrett's esophagus   . Cataract    bilateral   . 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 Eczema   . GERD (gastroesophageal reflux disease)   . Headache(784.0)   . History of IBS   . Hyperlipidemia    on medication  . Hypertension   . Low back pain   . Meniere disease   . 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 Neuromuscular disorder (HCC)    Hiatal Hernia, Raynauds disease  . Osteopenia   . 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HCC)   . 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 Tubular adenoma of colon 02/2013  . Varicose veins with inflammation   . Vasculitis [place])    Patient Active Problem List   Diagnosis Date Noted  . Osteoarthritis of left knee 09/15/2017    Priority: Medium  . 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 Hypertension 11/29/2013    Priority: Medium  . Hyperlipidemia 11/29/2013    Priority: Medium  . Barrett's esophagus 08/24/2013    Priority: Medium  . Osteoporosis 09/22/2006    Priority: Medium  . Aortic atherosclerosis ([place]) 02/11/2016    Priority: Low  . Eczema 11/29/2013    Priority: Low  . GERD (gastroesophageal reflux disease) 05/16/2013    Priority: Low  . Vitamin D deficiency 01/29/2012    Priority: Low  . Solitary pulmonary nodule 06/08/2011    Priority: Low  . HIATAL HERNIA WITH REFLUX 02/06/2010    Priority: Low  . DEGENERATIVE JOINT DISEASE, KNEE 03/14/2008    Priority: Low  .  VARICOSE VEINS LOWER EXTREMITIES W/INFLAMMATION 03/09/2007    Priority: Low  . Allergic rhinitis 03/09/2007    Priority: Low  . ACTINIC KERATOSIS, FOREHEAD, LEFT 03/09/2007    Priority: Low  . Headache(784.0) 03/09/2007    Priority: Low  . MENIERE'S DISEASE 09/22/2006    Priority: Low  . RAYNAUD'S DISEASE 09/22/2006    Priority: Low   Past Surgical History:  Procedure Laterality Date  . arthroscopic knee     left knee/ torn meniscus  . BUNIONECTOMY Right 1998   with other foot surgery   . carotid artery disection  1988   Carotid Artery Dissection  . CESAREAN SECTION     1 time  . COLONOSCOPY    . DILATION AND CURETTAGE OF UTERUS    . POLYPECTOMY    . POPLITEAL SYNOVIAL CYST EXCISION     left leg  . TONSILLECTOMY    . UPPER GASTROINTESTINAL ENDOSCOPY      Family History  Problem Relation Age of Onset  . Multiple myeloma Father   . Kidney disease Brother        cancer- removed  . Prostate cancer Brother   . Thyroid cancer Sister        s/p removal. papilary and anaplastic.   . Lung cancer Sister   . Alzheimer's disease Brother  older broterh  . Stroke Brother        88-older brother  . Colon cancer Neg Hx   . Esophageal cancer Neg Hx   . Rectal cancer Neg Hx   . Stomach cancer Neg Hx   . Colon polyps Neg Hx     Medications- reviewed and updated Current Outpatient Medications  Medication Sig Dispense Refill  . amLODipine (NORVASC) 2.5 MG tablet TAKE 1 TABLET BY MOUTH EVERY DAY 90 tablet 2  . aspirin 81 MG tablet Take 81 mg by mouth daily.      . fenofibrate 160 MG tablet TAKE 1 TABLET BY MOUTH EVERY DAY 90 tablet 1  . loratadine (CLARITIN) 10 MG tablet Take 10 mg by mouth daily. Take 1/2 tablet daily.    . metoprolol tartrate (LOPRESSOR) 50 MG tablet TAKE 1/2 TABLET BY MOUTH 2 TIMES DAILY 90 tablet 1  . montelukast (SINGULAIR) 10 MG tablet TAKE 1 TABLET BY MOUTH AT BEDTIME 90 tablet 1  . pantoprazole (PROTONIX) 40 MG tablet TAKE 1 TABLET BY MOUTH EVERY  DAY 90 tablet 1  . Vitamin D, Cholecalciferol, 1000 units TABS Take by mouth.     No current facility-administered medications for this visit.     Allergies-reviewed and updated Allergies  Allergen Reactions  . Hydrocodone-Acetaminophen     VOMITING  . Azithromycin Other (See Comments)    Thrush   . Neomycin   . Penicillins     REACTION: Arm swelling  . Pneumococcal Vaccine Polyvalent     ARM REDNESS WITH TENDERNESS  . Pneumovax [Pneumococcal Polysaccharide Vaccine]     Social History   Social History Narrative   Lives with husband who is also a patient of Dr. Yong Channel   Objective  Objective:  BP (!) 142/62   Pulse 60   Temp 97.7 F (36.5 C)   Ht '5\' 5"'$  (1.651 m)   Wt 126 lb 3.2 oz (57.2 kg)   SpO2 98%   BMI 21.00 kg/m  Gen: NAD, resting comfortably HEENT: Mucous membranes are moist. Oropharynx normal Neck: no thyromegaly CV: RRR no murmurs rubs or gallops Lungs: CTAB no crackles, wheeze, rhonchi Abdomen: soft/nontender/nondistended/normal bowel sounds. No rebound or guarding.  Ext: no edema Skin: warm, dry Neuro: grossly normal, moves all extremities, PERRLA   Assessment and Plan   78 y.o. female presenting for annual physical.  Health Maintenance counseling: 1. Anticipatory guidance: Patient counseled regarding regular dental exams -q6 months- other than with pandemic, eye exams yearly,  avoiding smoking and second hand smoke , limiting alcohol to 1 beverage per day- doesn't drink .   2. Risk factor reduction:  Advised patient of need for regular exercise and diet rich and fruits and vegetables to reduce risk of heart attack and stroke. Exercise- 2.9 miles at least 3x a week with 3 other women and 3 dogs including hers. Diet- reasonably healthy diet- weight stable over last year.  Wt Readings from Last 3 Encounters:  12/26/18 126 lb 3.2 oz (57.2 kg)  12/01/18 127 lb 12.8 oz (58 kg)  05/03/18 124 lb (56.2 kg)  3. Immunizations/screenings/ancillary  studies-thanked patient for getting her flu shot. Considering shingrix- has information and is thinking it over.  Immunization History  Administered Date(s) Administered  . Fluad Quad(high Dose 65+) 12/01/2018  . Influenza Split 01/04/2012  . Influenza Whole 01/03/2007, 12/08/2007, 12/17/2008, 01/20/2010  . Influenza, High Dose Seasonal PF 01/10/2015, 12/12/2015, 12/29/2016, 12/23/2017  . Influenza,inj,Quad PF,6+ Mos 01/05/2013, 12/24/2013  . Pneumococcal Polysaccharide-23 02/12/2004  .  Td 03/23/2003, 04/04/2013  4. Cervical cancer screening- past age to based screening.  No history of abnormal Pap smear 5. Breast cancer screening-   mammogram 09/15/2018 6. Colon cancer screening - May 03, 2018 no precancers- no more colonoscopy 7. Skin cancer screening-saw [facility] dermatology within last year.advised regular sunscreen use. Denies worrisome, changing, or new skin lesions- di dhave some cryotherapy with them.  8. Birth control/STD check- postmenopausal and monogamous 9. Osteoporosis screening at 22- update bone density today as below-Never smoker  Status of chronic or acute concerns  GERD/Barrett's esophagus -controlled on Protonix '40mg'$  . 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   Hyperlipidemia- controlled on fenofibrate.  We have opted to continue aspirin 81 mg due to history of fibromuscular dysplasia  HTN-controlled on Amlodipine 2.'5mg'$ , Lopressor '25mg'$  BID at home- home blood pressure readings 120s/70s slightly high today in office but she admits to high anxiety with office visits, pt denies HA, Blurred vision and chest discomfort.  Aortic atherosclerosis ([place])- discussed diagnosis.  Continue risk factor modification  Localized osteoporosis  without current pathological fracture- patient has not been interested in bisphosphonates.  She is taking vitamin D and getting calcium through diet.  She is trying to get weightbearing exercise.  Last bone density in May 2018 was in osteopenia range after above treatments-we will repeat at this time  Vitamin D deficiency-she is currently taking1000 units of vitamin D.  Update vitamin D level today  OA left knee- last shot was last July. Arthroscopic surgery 2009.   Recommended follow up: 6 months Future Appointments  Date Time Provider [place]  06/26/2019  8:40 AM [name], MD [facility]   Lab/Order associations: fasting   ICD-10-CM   1. Preventative health care  Z00.00 VITAMIN D 25 Hydroxy (Vit-D Deficiency, Fractures)    CBC    Lipid panel    Comprehensive metabolic panel    DG Bone Density  2. Essential hypertension  I10 CBC    Lipid panel    Comprehensive metabolic panel  3. Hyperlipidemia, unspecified hyperlipidemia type  E78.5 CBC    Lipid panel    Comprehensive metabolic panel  4. Barrett's esophagus without dysplasia  K22.70   5. Localized osteoporosis without current pathological fracture  M81.6 DG Bone Density  6. Aortic atherosclerosis (HCC)  I70.0   7. Vitamin D deficiency  E55.9 VITAMIN D 25 Hydroxy (Vit-D Deficiency, Fractures)   Return precautions advised.  Garret Reddish, MD

## 2018-12-26 ENCOUNTER — Ambulatory Visit (INDEPENDENT_AMBULATORY_CARE_PROVIDER_SITE_OTHER): Payer: Medicare Other | Admitting: Family Medicine

## 2018-12-26 ENCOUNTER — Other Ambulatory Visit: Payer: Self-pay

## 2018-12-26 VITALS — BP 142/62 | HR 60 | Temp 97.7°F | Ht 65.0 in | Wt 126.2 lb

## 2018-12-26 DIAGNOSIS — M816 Localized osteoporosis [Lequesne]: Secondary | ICD-10-CM

## 2018-12-26 DIAGNOSIS — E785 Hyperlipidemia, unspecified: Secondary | ICD-10-CM | POA: Diagnosis not present

## 2018-12-26 DIAGNOSIS — K2270 Barrett's esophagus without dysplasia: Secondary | ICD-10-CM

## 2018-12-26 DIAGNOSIS — Z0000 Encounter for general adult medical examination without abnormal findings: Secondary | ICD-10-CM

## 2018-12-26 DIAGNOSIS — I10 Essential (primary) hypertension: Secondary | ICD-10-CM | POA: Diagnosis not present

## 2018-12-26 DIAGNOSIS — I700 Atherosclerosis of aorta: Secondary | ICD-10-CM

## 2018-12-26 DIAGNOSIS — E559 Vitamin D deficiency, unspecified: Secondary | ICD-10-CM

## 2018-12-26 LAB — COMPREHENSIVE METABOLIC PANEL
ALT: 20 U/L (ref 0–35)
AST: 25 U/L (ref 0–37)
Albumin: 4.4 g/dL (ref 3.5–5.2)
Alkaline Phosphatase: 51 U/L (ref 39–117)
BUN: 25 mg/dL — ABNORMAL HIGH (ref 6–23)
CO2: 27 mEq/L (ref 19–32)
Calcium: 9.7 mg/dL (ref 8.4–10.5)
Chloride: 104 mEq/L (ref 96–112)
Creatinine, Ser: 0.63 mg/dL (ref 0.40–1.20)
GFR: 91.68 mL/min (ref >60.00)
Glucose, Bld: 79 mg/dL (ref 70–99)
Potassium: 4.3 mEq/L (ref 3.5–5.1)
Sodium: 140 mEq/L (ref 135–145)
Total Bilirubin: 0.4 mg/dL (ref 0.2–1.2)
Total Protein: 7.5 g/dL (ref 6.0–8.3)

## 2018-12-26 LAB — VITAMIN D 25 HYDROXY (VIT D DEFICIENCY, FRACTURES): VITD: 45.88 ng/mL (ref 30.00–100.00)

## 2018-12-26 LAB — LIPID PANEL
Cholesterol: 135 mg/dL (ref 0–200)
HDL: 55.5 mg/dL (ref >39.00)
LDL Cholesterol: 73 mg/dL (ref 0–99)
NonHDL: 79.53
Total CHOL/HDL Ratio: 2
Triglycerides: 35 mg/dL (ref 0.0–149.0)
VLDL: 7 mg/dL (ref 0.0–40.0)

## 2018-12-26 LAB — CBC
HCT: 38 % (ref 36.0–46.0)
Hemoglobin: 12.3 g/dL (ref 12.0–15.0)
MCHC: 32.5 g/dL (ref 30.0–36.0)
MCV: 85.5 fl (ref 78.0–100.0)
Platelets: 340 10*3/uL (ref 150.0–400.0)
RBC: 4.44 Mil/uL (ref 3.87–5.11)
RDW: 14.1 % (ref 11.5–15.5)
WBC: 5.6 10*3/uL (ref 4.0–10.5)

## 2019-01-02 ENCOUNTER — Ambulatory Visit (INDEPENDENT_AMBULATORY_CARE_PROVIDER_SITE_OTHER)
Admission: RE | Admit: 2019-01-02 | Discharge: 2019-01-02 | Disposition: A | Discharge status: Home / Self Care | Payer: Medicare Other | Source: Ambulatory Visit | Attending: Family Medicine | Admitting: Family Medicine

## 2019-01-02 ENCOUNTER — Other Ambulatory Visit: Payer: Self-pay

## 2019-01-02 DIAGNOSIS — M816 Localized osteoporosis [Lequesne]: Secondary | ICD-10-CM

## 2019-01-02 DIAGNOSIS — Z0000 Encounter for general adult medical examination without abnormal findings: Secondary | ICD-10-CM

## 2019-01-30 ENCOUNTER — Other Ambulatory Visit: Payer: Self-pay | Admitting: Family Medicine

## 2019-03-02 ENCOUNTER — Other Ambulatory Visit: Payer: Self-pay | Admitting: Family Medicine

## 2019-04-05 DIAGNOSIS — L570 Actinic keratosis: Secondary | ICD-10-CM | POA: Diagnosis not present

## 2019-04-05 DIAGNOSIS — L821 Other seborrheic keratosis: Secondary | ICD-10-CM | POA: Diagnosis not present

## 2019-04-05 DIAGNOSIS — L565 Disseminated superficial actinic porokeratosis (DSAP): Secondary | ICD-10-CM | POA: Diagnosis not present

## 2019-04-05 DIAGNOSIS — L814 Other melanin hyperpigmentation: Secondary | ICD-10-CM | POA: Diagnosis not present

## 2019-04-30 ENCOUNTER — Ambulatory Visit: Payer: Medicare Other | Attending: Internal Medicine

## 2019-04-30 DIAGNOSIS — Z23 Encounter for immunization: Secondary | ICD-10-CM | POA: Insufficient documentation

## 2019-04-30 NOTE — Progress Notes (Signed)
   Covid-19 Vaccination Clinic  Name:  Lori [name]    MRN: [id number] DOB: [birth year]  04/30/2019  [name] was observed post Covid-19 immunization for 30 minutes based on pre-vaccination screening without incidence. She was provided with Vaccine Information Sheet and instruction to access the V-Safe system.   [name] was instructed to call 911 with any severe reactions post vaccine: Marland Kitchen Difficulty breathing  . Swelling of your face and throat  . A fast heartbeat  . A bad rash all over your body  . Dizziness and weakness    Immunizations Administered    Name Date Dose VIS Date Route   Pfizer COVID-19 Vaccine 04/30/2019 10:28 AM 0.3 mL 03/02/2019 Intramuscular   Manufacturer: [place]   Lot: [id number]   [place]: [id number]

## 2019-05-14 ENCOUNTER — Other Ambulatory Visit: Payer: Self-pay | Admitting: Family Medicine

## 2019-05-19 ENCOUNTER — Ambulatory Visit: Payer: Medicare Other

## 2019-05-23 ENCOUNTER — Ambulatory Visit: Payer: Medicare Other | Attending: Internal Medicine

## 2019-05-23 DIAGNOSIS — Z23 Encounter for immunization: Secondary | ICD-10-CM | POA: Insufficient documentation

## 2019-05-23 NOTE — Progress Notes (Signed)
   Covid-19 Vaccination Clinic  Name:  Lori [name]    MRN: [id number] DOB: 09-28-1940  05/23/2019  [name] was observed post Covid-19 immunization for 30 minutes based on pre-vaccination screening without incident. She was provided with Vaccine Information Sheet and instruction to access the V-Safe system.   [name] was instructed to call 911 with any severe reactions post vaccine: Marland Kitchen Difficulty breathing  . Swelling of face and throat  . A fast heartbeat  . A bad rash all over body  . Dizziness and weakness   Immunizations Administered    Name Date Dose VIS Date Route   Pfizer COVID-19 Vaccine 05/23/2019 10:29 AM 0.3 mL 03/02/2019 Intramuscular   Manufacturer: [place]   Lot: [id number]   [place]: [id number]

## 2019-06-05 ENCOUNTER — Other Ambulatory Visit: Payer: Self-pay | Admitting: Family Medicine

## 2019-06-26 ENCOUNTER — Encounter: Payer: Self-pay | Admitting: Family Medicine

## 2019-06-26 ENCOUNTER — Ambulatory Visit (INDEPENDENT_AMBULATORY_CARE_PROVIDER_SITE_OTHER): Payer: Medicare Other | Admitting: Family Medicine

## 2019-06-26 ENCOUNTER — Other Ambulatory Visit: Payer: Self-pay

## 2019-06-26 VITALS — BP 118/78 | HR 66 | Temp 97.6°F | Ht 65.0 in | Wt 125.0 lb

## 2019-06-26 DIAGNOSIS — J301 Allergic rhinitis due to pollen: Secondary | ICD-10-CM

## 2019-06-26 DIAGNOSIS — K589 Irritable bowel syndrome without diarrhea: Secondary | ICD-10-CM

## 2019-06-26 DIAGNOSIS — I10 Essential (primary) hypertension: Secondary | ICD-10-CM

## 2019-06-26 DIAGNOSIS — M816 Localized osteoporosis [Lequesne]: Secondary | ICD-10-CM

## 2019-06-26 DIAGNOSIS — E785 Hyperlipidemia, unspecified: Secondary | ICD-10-CM | POA: Diagnosis not present

## 2019-06-26 DIAGNOSIS — I700 Atherosclerosis of aorta: Secondary | ICD-10-CM

## 2019-06-26 DIAGNOSIS — Z79899 Other long term (current) drug therapy: Secondary | ICD-10-CM | POA: Diagnosis not present

## 2019-06-26 DIAGNOSIS — K2270 Barrett's esophagus without dysplasia: Secondary | ICD-10-CM | POA: Diagnosis not present

## 2019-06-26 LAB — BASIC METABOLIC PANEL
BUN: 23 mg/dL (ref 6–23)
CO2: 27 mEq/L (ref 19–32)
Calcium: 9.5 mg/dL (ref 8.4–10.5)
Chloride: 106 mEq/L (ref 96–112)
Creatinine, Ser: 0.66 mg/dL (ref 0.40–1.20)
GFR: 86.78 mL/min (ref >60.00)
Glucose, Bld: 77 mg/dL (ref 70–99)
Potassium: 4.1 mEq/L (ref 3.5–5.1)
Sodium: 140 mEq/L (ref 135–145)

## 2019-06-26 LAB — VITAMIN B12: Vitamin B-12: 384 pg/mL (ref 211–911)

## 2019-06-26 NOTE — Assessment & Plan Note (Signed)
S:patient does a lot of whole wheat/grain. Has a fair amount of gas/bloating. No constipation. Bowel movements 4x a day- can be loose but not always . Had colonoscopy 04/2018 and was told no more colonoscopy needed.   Walking 2.5-3 miles regularly.  A/P: mild issues- she may cut down slightly on whole wheat/grain

## 2019-06-26 NOTE — Assessment & Plan Note (Signed)
#  osteoposis- protonix contributes. On vitamin D. Tries to get calcium through food. Wants to repeat 2022 or 2023- did have slight worsening in hips this time. Knows to work on [employer] bearing exercise- she will continue her walking. She cant take fosamax due to barretts/gerd- she is hesitant on reclast options so wants to hold off unless worsens

## 2019-06-26 NOTE — Progress Notes (Signed)
Phone [phone] In person visit   Subjective:   Lori [name] is a 79 y.o. year old very pleasant female patient who presents for/with See problem oriented charting Chief Complaint  Patient presents with  . Hypertension   This visit occurred during the SARS-CoV-2 public health emergency.  Safety protocols were in place, including screening questions prior to the visit, additional usage of staff PPE, and extensive cleaning of exam room while observing appropriate contact time as indicated for disinfecting solutions.   Past Medical History-  Patient Active Problem List   Diagnosis Date Noted  . Osteoarthritis of left knee 09/15/2017    Priority: Medium  . 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 Hypertension 11/29/2013    Priority: Medium  . Hyperlipidemia 11/29/2013    Priority: Medium  . Barrett's esophagus 08/24/2013    Priority: Medium  . Osteoporosis 09/22/2006    Priority: Medium  . IBS (irritable bowel syndrome) 06/26/2019    Priority: Low  . Aortic atherosclerosis ([place]) 02/11/2016    Priority: Low  . Eczema 11/29/2013    Priority: Low  . GERD (gastroesophageal reflux disease) 05/16/2013    Priority: Low  . Vitamin D deficiency 01/29/2012    Priority: Low  . Solitary pulmonary nodule 06/08/2011    Priority: Low  . HIATAL HERNIA WITH REFLUX 02/06/2010    Priority: Low  . DEGENERATIVE JOINT DISEASE, KNEE 03/14/2008    Priority: Low  . VARICOSE VEINS LOWER EXTREMITIES W/INFLAMMATION 03/09/2007    Priority: Low  . Allergic rhinitis 03/09/2007    Priority: Low  . ACTINIC KERATOSIS, FOREHEAD, LEFT 03/09/2007    Priority: Low  . Headache(784.0) 03/09/2007    Priority: Low  . MENIERE'S DISEASE 09/22/2006    Priority: Low  . RAYNAUD'S DISEASE 09/22/2006    Priority: Low    Medications- reviewed and updated Current Outpatient Medications  Medication Sig Dispense Refill  . amLODipine (NORVASC) 2.5 MG tablet TAKE 1 TABLET BY MOUTH EVERY DAY 90  tablet 2  . aspirin 81 MG tablet Take 81 mg by mouth daily.      . fenofibrate 160 MG tablet TAKE 1 TABLET BY MOUTH EVERY DAY 90 tablet 1  . metoprolol tartrate (LOPRESSOR) 50 MG tablet TAKE 1/2 TABLET BY MOUTH 2 TIMES DAILY 90 tablet 1  . montelukast (SINGULAIR) 10 MG tablet TAKE 1 TABLET BY MOUTH AT BEDTIME 90 tablet 1  . pantoprazole (PROTONIX) 40 MG tablet TAKE 1 TABLET BY MOUTH EVERY DAY 90 tablet 1  . Vitamin D, Cholecalciferol, 1000 units TABS Take by mouth.    . loratadine (CLARITIN) 10 MG tablet Take 10 mg by mouth daily. Take 1/2 tablet daily.     No current facility-administered medications for this visit.     Objective:  BP 118/78   Pulse 66   Temp 97.6 F (36.4 C) (Temporal)   Ht 5\' 5"  (1.651 m)   Wt 125 lb (56.7 kg)   SpO2 97%   BMI 20.80 kg/m  Gen: NAD, resting comfortably CV: RRR no murmurs rubs or gallops Lungs: CTAB no crackles, wheeze, rhonchi Ext: no edema Skin: warm, dry    Assessment and Plan   # Hypertension  S:compliant with amlodipine 2.5mg , metoprolol 25 mg BID.   BP Readings from Last 3 Encounters:  06/26/19 118/78  12/26/18 (!) 142/62  12/01/18 126/72  A/P:  Stable. Continue current medications.   # GERD/barrett's esophagus S:Compliant with protonix 40mg  daily- using [employer] coffee and manuka honey starting late  2019 has been very helpful A/P: patient states she is coming up due for next 3 year EGD- she is going to call Dr. Fuller Plan to schedule. Continue protonix  # Hyperlipidemia/aortic atherosclerosis S:lipids have been reasonably controlled on fenofibrate.  LDL goal at least under 100 and close to 70 and triglyceride goal under 200.  Lab Results  Component Value Date   CHOL 135 12/26/2018   HDL 55.50 12/26/2018   LDLCALC 73 12/26/2018   LDLDIRECT 74.0 02/06/2016   TRIG 35.0 12/26/2018   CHOLHDL 2 12/26/2018  A/P: reasonable control of lipids- continue current meds   Aortic atherosclerosis- continue risk factor modification- she also  takes aspirin  # allergic rhinitis S:reasonable control on singulair until recently. Has not started back on 1/2 claritin before bed as can dry her out . Gets some nausea when there is a lot of pollen. Doesn't do well with nasal saline. Fair amount of drainage recently A/P: poor control- recommended restarting claritin before bed   # IBS S:patient does a lot of whole wheat/grain. Has a fair amount of gas/bloating. No constipation. Bowel movements 4x a day- can be loose but not always . Had colonoscopy 04/2018 and was told no more colonoscopy needed.   Walking 2.5-3 miles regularly.  A/P: mild issues- she may cut down slightly on whole wheat/grain  #with some aches and pains- she would like to know if potassium could be low- will check today- prefers medicine repletion as has tried bananas etc at home  #osteoposis- protonix contributes. On vitamin D. Tries to get calcium through food. Wants to repeat 2022 or 2023- did have slight worsening in hips this time. Knows to work on [employer] bearing exercise- she will continue her walking. She cant take fosamax due to barretts/gerd- she is hesitant on reclast options so wants to hold off unless worsens  Recommended follow up: Return in about 6 months (around 12/26/2019) for physical or sooner if needed.   Lab/Order associations:   ICD-10-CM   1. Essential hypertension  99991111 Basic metabolic panel  2. Hyperlipidemia, unspecified hyperlipidemia type  E78.5   3. Barrett's esophagus without dysplasia  K22.70   4. Seasonal allergic rhinitis due to pollen  J30.1   5. High risk medication use  Z79.899 Vitamin B12  6. Irritable bowel syndrome, unspecified type  K58.9   7. Aortic atherosclerosis (HCC) Chronic I70.0   8. Localized osteoporosis without current pathological fracture  M81.6    Return precautions advised.  Garret Reddish, MD

## 2019-06-26 NOTE — Patient Instructions (Addendum)
Please stop by lab before you go If you do not have mychart- we will call you about results within 5 business days of [place] receiving them.  If you have mychart- we will send your results within 3 business days of [place] receiving them.  If abnormal or we want to clarify a result, we will call or mychart you to make sure you receive the message.  If you have questions or concerns or don't hear within 5 business days, please send [place] a message or call us.   No changes today other than restarting half claritin  Recommended follow up: Return in about 6 months (around 12/26/2019) for physical or sooner if needed.

## 2019-08-16 ENCOUNTER — Other Ambulatory Visit: Payer: Self-pay | Admitting: Family Medicine

## 2019-08-24 ENCOUNTER — Encounter: Payer: Self-pay | Admitting: Gastroenterology

## 2019-09-17 DIAGNOSIS — Z1231 Encounter for screening mammogram for malignant neoplasm of breast: Secondary | ICD-10-CM | POA: Diagnosis not present

## 2019-09-17 LAB — HM MAMMOGRAPHY

## 2019-10-19 ENCOUNTER — Ambulatory Visit (AMBULATORY_SURGERY_CENTER): Payer: Self-pay | Admitting: *Deleted

## 2019-10-19 ENCOUNTER — Other Ambulatory Visit: Payer: Self-pay

## 2019-10-19 ENCOUNTER — Encounter: Payer: Self-pay | Admitting: Gastroenterology

## 2019-10-19 VITALS — Ht 65.0 in | Wt 123.0 lb

## 2019-10-19 DIAGNOSIS — K2270 Barrett's esophagus without dysplasia: Secondary | ICD-10-CM

## 2019-10-19 NOTE — Progress Notes (Signed)
cov vacc x 2  No egg or soy allergy known to patient  No issues with past sedation with any surgeries or procedures no intubation problems in the past  No diet pills per patient No home 02 use per patient  No blood thinners per patient  Pt denies issues with constipation  No A fib or A flutter  EMMI video to pt or MyChart  COVID 19 guidelines implemented in PV today   Pt states in [facility] today some issues with swallowing as well   Due to the COVID-19 pandemic we are asking patients to follow these guidelines. Please only bring one care partner. Please be aware that your care partner may wait in the car in the parking lot or if they feel like they will be too hot to wait in the car, they may wait in the lobby on the 4th floor. All care partners are required to wear a mask the entire time (we do not have any that we can provide them), they need to practice social distancing, and we will do a Covid check for all patient's and care partners when you arrive. Also we will check their temperature and your temperature. If the care partner waits in their car they need to stay in the parking lot the entire time and we will call them on their cell phone when the patient is ready for discharge so they can bring the car to the front of the building. Also all patient's will need to wear a mask into building.

## 2019-10-26 ENCOUNTER — Other Ambulatory Visit: Payer: Self-pay | Admitting: Gastroenterology

## 2019-10-26 ENCOUNTER — Other Ambulatory Visit: Payer: Self-pay

## 2019-10-26 ENCOUNTER — Encounter: Payer: Self-pay | Admitting: Gastroenterology

## 2019-10-26 ENCOUNTER — Ambulatory Visit (AMBULATORY_SURGERY_CENTER): Payer: Medicare Other | Admitting: Gastroenterology

## 2019-10-26 VITALS — BP 125/57 | HR 55 | Temp 97.5°F | Resp 12 | Ht 65.0 in | Wt 123.0 lb

## 2019-10-26 DIAGNOSIS — K219 Gastro-esophageal reflux disease without esophagitis: Secondary | ICD-10-CM | POA: Diagnosis not present

## 2019-10-26 DIAGNOSIS — K2270 Barrett's esophagus without dysplasia: Secondary | ICD-10-CM

## 2019-10-26 DIAGNOSIS — K449 Diaphragmatic hernia without obstruction or gangrene: Secondary | ICD-10-CM | POA: Diagnosis not present

## 2019-10-26 DIAGNOSIS — K317 Polyp of stomach and duodenum: Secondary | ICD-10-CM | POA: Diagnosis not present

## 2019-10-26 DIAGNOSIS — K208 Other esophagitis without bleeding: Secondary | ICD-10-CM

## 2019-10-26 MED ORDER — SODIUM CHLORIDE 0.9 % IV SOLN: 500.0000 mL | Freq: Once | INTRAVENOUS | Status: DC

## 2019-10-26 NOTE — Op Note (Signed)
[place] Patient Name: Lori [name] Procedure Date: 10/26/2019 7:18 AM MRN: [id number] Endoscopist: Ladene Artist , MD Age: 79 Referring MD:  Date of Birth: [birth year] Gender: Female Account #: [id number] Procedure:                Upper GI endoscopy Indications:              Surveillance for malignancy due to personal history                            of Barrett's esophagus Medicines:                Monitored Anesthesia Care Procedure:                Pre-Anesthesia Assessment:                           - Prior to the procedure, a History and Physical                            was performed, and patient medications and                            allergies were reviewed. The patient's tolerance of                            previous anesthesia was also reviewed. The risks                            and benefits of the procedure and the sedation                            options and risks were discussed with the patient.                            All questions were answered, and informed consent                            was obtained. Prior Anticoagulants: The patient has                            taken no previous anticoagulant or antiplatelet                            agents. ASA Grade Assessment: III - A patient with                            severe systemic disease. After reviewing the risks                            and benefits, the patient was deemed in                            satisfactory condition to undergo the procedure.  After obtaining informed consent, the endoscope was                            passed under direct vision. Throughout the                            procedure, the patient's blood pressure, pulse, and                            oxygen saturations were monitored continuously. The                            Endoscope was introduced through the mouth, and                            advanced to the second part  of duodenum. The upper                            GI endoscopy was accomplished without difficulty.                            The patient tolerated the procedure well. Scope In: Scope Out: Findings:                 The esophagus and gastroesophageal junction were                            examined with white light from a forward view and                            retroflexed position. There were esophageal mucosal                            changes secondary to established short-segment                            Barrett's disease. These changes involved the                            mucosa extending to the Z-line. Salmon-colored                            mucosa was present. The maximum longitudinal extent                            of these esophageal mucosal changes was 1 cm in                            length. Mucosa was biopsied with a cold forceps for                            histology. One specimen bottle was sent to  pathology.                           The exam of the esophagus was otherwise normal.                           A small hiatal hernia was present.                           A few small sessile polyps with no bleeding and no                            stigmata of recent bleeding were found in the                            gastric body.                           The exam of the stomach was otherwise normal.                           The duodenal bulb and second portion of the                            duodenum were normal. Complications:            No immediate complications. Estimated Blood Loss:     Estimated blood loss was minimal. Impression:               - Esophageal mucosal changes secondary to                            established short-segment Barrett's disease.                            Biopsied.                           - Small hiatal hernia.                           - A few gastric polyps.                           -  Normal duodenal bulb and second portion of the                            duodenum. Recommendation:           - Patient has a contact number available for                            emergencies. The signs and symptoms of potential                            delayed complications were discussed with the  patient. Return to normal activities tomorrow.                            Written discharge instructions were provided to the                            patient.                           - Resume previous diet.                           - Continue present medications.                           - Await pathology results.                           - Repeat upper endoscopy in 3 years for                            surveillance based on pathology results vs                            discontinue surveillance due to age. Ladene Artist, MD 10/26/2019 8:33:33 AM This report has been signed electronically.

## 2019-10-26 NOTE — Progress Notes (Signed)
Pt's states no medical or surgical changes since previsit or office visit.  VS CW  

## 2019-10-26 NOTE — Patient Instructions (Signed)
Impression/Recommendations:  Barrett's Esophagus handout given to patient. Hiatal hernia handout given to patient.  Resume previous diet. Continue present medications. Await pathology results.  Repeat upper endoscopy in 3 years for surveillance.  YOU HAD AN ENDOSCOPIC PROCEDURE TODAY AT [facility]:   Refer to the procedure report that was given to you for any specific questions about what was found during the examination.  If the procedure report does not answer your questions, please call your gastroenterologist to clarify.  If you requested that your care partner not be given the details of your procedure findings, then the procedure report has been included in a sealed envelope for you to review at your convenience later.  YOU SHOULD EXPECT: Some feelings of bloating in the abdomen. Passage of more gas than usual.  Walking can help get rid of the air that was put into your GI tract during the procedure and reduce the bloating. If you had a lower endoscopy (such as a colonoscopy or flexible sigmoidoscopy) you may notice spotting of blood in your stool or on the toilet paper. If you underwent a bowel prep for your procedure, you may not have a normal bowel movement for a few days.  Please Note:  You might notice some irritation and congestion in your nose or some drainage.  This is from the oxygen used during your procedure.  There is no need for concern and it should clear up in a day or so.  SYMPTOMS TO REPORT IMMEDIATELY:  Following upper endoscopy (EGD)  Vomiting of blood or coffee ground material  New chest pain or pain under the shoulder blades  Painful or persistently difficult swallowing  New shortness of breath  Fever of 100F or higher  Black, tarry-looking stools  For urgent or emergent issues, a gastroenterologist can be reached at any hour by calling [phone]. Do not use MyChart messaging for urgent concerns.    DIET:  We do recommend a small meal  at first, but then you may proceed to your regular diet.  Drink plenty of fluids but you should avoid alcoholic beverages for 24 hours.  ACTIVITY:  You should plan to take it easy for the rest of today and you should NOT DRIVE or use heavy machinery until tomorrow (because of the sedation medicines used during the test).    FOLLOW UP: Our staff will call the number listed on your records 48-72 hours following your procedure to check on you and address any questions or concerns that you may have regarding the information given to you following your procedure. If we do not reach you, we will leave a message.  We will attempt to reach you two times.  During this call, we will ask if you have developed any symptoms of COVID 19. If you develop any symptoms (ie: fever, flu-like symptoms, shortness of breath, cough etc.) before then, please call [phone].  If you test positive for Covid 19 in the 2 weeks post procedure, please call and report this information to [place].    If any biopsies were taken you will be contacted by phone or by letter within the next 1-3 weeks.  Please call us at [phone] if you have not heard about the biopsies in 3 weeks.    SIGNATURES/CONFIDENTIALITY: You and/or your care partner have signed paperwork which will be entered into your electronic medical record.  These signatures attest to the fact that that the information above on your After Visit Summary has been reviewed  and is understood.  Full responsibility of the confidentiality of this discharge information lies with you and/or your care-partner. 

## 2019-10-26 NOTE — Progress Notes (Signed)
Called to room to assist during endoscopic procedure.  Patient ID and intended procedure confirmed with present staff. Received instructions for my participation in the procedure from the performing physician.  

## 2019-10-26 NOTE — Progress Notes (Signed)
Report to PACU, RN, vss, BBS= Clear.  

## 2019-10-30 ENCOUNTER — Telehealth: Payer: Self-pay | Admitting: *Deleted

## 2019-10-30 NOTE — Telephone Encounter (Signed)
  Follow up Call-  Call back number 10/26/2019 05/03/2018  Post procedure Call Back phone  # [phone]  Permission to leave phone message Yes Yes  Some recent data might be hidden     Patient questions:  Do you have a fever, pain , or abdominal swelling? No. Pain Score  0 *  Have you tolerated food without any problems? Yes.    Have you been able to return to your normal activities? Yes.    Do you have any questions about your discharge instructions: Diet   No. Medications  No. Follow up visit  No.  Do you have questions or concerns about your Care? No.  Actions: * If pain score is 4 or above: No action needed, pain <4.   1. Have you developed a fever since your procedure? no  2.   Have you had an respiratory symptoms (SOB or cough) since your procedure? no  3.   Have you tested positive for COVID 19 since your procedure no  4.   Have you had any family members/close contacts diagnosed with the COVID 19 since your procedure?  no   If yes to any of these questions please route to Joylene John, RN and Erenest Rasher, RN

## 2019-11-02 ENCOUNTER — Other Ambulatory Visit: Payer: Self-pay | Admitting: Family Medicine

## 2019-11-09 ENCOUNTER — Encounter: Payer: Self-pay | Admitting: Gastroenterology

## 2019-11-12 ENCOUNTER — Other Ambulatory Visit: Payer: Self-pay | Admitting: Family Medicine

## 2019-11-13 ENCOUNTER — Telehealth: Payer: Self-pay | Admitting: Family Medicine

## 2019-11-13 NOTE — Progress Notes (Signed)
°  Chronic Care Management   Outreach Note  11/13/2019 Name: Lori [name] MRN: [id number] DOB: September 02, 1940  Referred by: [name], MD Reason for referral : No chief complaint on file.   An unsuccessful telephone outreach was attempted today. The patient was referred to the pharmacist for assistance with care management and care coordination.   Follow Up Plan:   Earney Hamburg Upstream Scheduler

## 2019-11-21 ENCOUNTER — Telehealth: Payer: Self-pay | Admitting: Family Medicine

## 2019-11-21 NOTE — Progress Notes (Signed)
  Chronic Care Management   Outreach Note  11/21/2019 Name: Lori [name] MRN: [id number] DOB: [birth year]  Referred by: [name], MD Reason for referral : No chief complaint on file.   An unsuccessful telephone outreach was attempted today. The patient was referred to the pharmacist for assistance with care management and care coordination.   Follow Up Plan:   Earney Hamburg Upstream Scheduler

## 2019-11-29 ENCOUNTER — Telehealth: Payer: Self-pay | Admitting: Family Medicine

## 2019-11-29 DIAGNOSIS — L570 Actinic keratosis: Secondary | ICD-10-CM | POA: Diagnosis not present

## 2019-11-29 DIAGNOSIS — L821 Other seborrheic keratosis: Secondary | ICD-10-CM | POA: Diagnosis not present

## 2019-11-29 NOTE — Progress Notes (Signed)
°  Chronic Care Management   Outreach Note  11/29/2019 Name: Aseneth Hack MRN: [id number] DOB: June 28, 1940  Referred by: [name], MD Reason for referral : No chief complaint on file.   An unsuccessful telephone outreach was attempted today. The patient was referred to the pharmacist for assistance with care management and care coordination.   Follow Up Plan:   Earney Hamburg Upstream Scheduler

## 2019-12-03 ENCOUNTER — Other Ambulatory Visit: Payer: Self-pay | Admitting: Family Medicine

## 2019-12-10 ENCOUNTER — Other Ambulatory Visit: Payer: Self-pay | Admitting: Family Medicine

## 2019-12-26 NOTE — Patient Instructions (Addendum)
Please stop by lab before you go If you have mychart- we will send your results within 3 business days of [place] receiving them.  If you do not have mychart- we will call you about results within 5 business days of [place] receiving them.  *please note we are currently using [facility] which has a longer processing time than [facility] typically so labs may not come back as quickly as in the past *please also note that you will see labs on mychart as soon as they post. I will later go in and write notes on them- will say "notes from Dr. Yong Channel"  Health Maintenance Due  Topic Date Due  . INFLUENZA VACCINE In office flu shot high dose  Let us know when you get covid 19 booster in 4 weeks or so 10/21/2019

## 2019-12-26 NOTE — Progress Notes (Signed)
Phone [phone]   Subjective:  Patient presents today for their annual physical. Chief complaint-noted.   See problem oriented charting- ROS- full  review of systems was completed and negative except for: back pain, eyes run if eyelash gets into eye  The following were reviewed and entered/updated in epic: Past Medical History:  Diagnosis Date  . Allergy   . Anemia    past hx of anemia  . Aneurysm ([place])    pseudo-aneurym of carotid arteries per pt  . Aortic atherosclerosis ([place])   . Arthritis    knee- DJD   . Barrett's esophagus   . Burning mouth syndrome    Dr Redmond Baseman [phone] - no smell or taste x 4 yrs per pt   . Cataract    bilateral   . 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 Eczema   . GERD (gastroesophageal reflux disease)   . Headache(784.0)   . History of IBS   . 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 Hyperlipidemia    on medication  . Hypertension   . Low back pain   . Meniere disease   . 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 Neuromuscular disorder (HCC)    Hiatal Hernia, Raynauds disease  . Osteopenia   . 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HCC)   . Stroke [facility]) 1988   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 carotid artery dissection   . Tubular adenoma of colon 02/2013  . Varicose veins with inflammation    upper and lower per pt   . Vasculitis [facility])    Patient Active Problem List   Diagnosis Date Noted  . Osteoarthritis of left knee 09/15/2017    Priority: Medium  . 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 Hypertension 11/29/2013    Priority: Medium  . Hyperlipidemia 11/29/2013    Priority: Medium  . Barrett's esophagus 08/24/2013    Priority: Medium  . Osteoporosis 09/22/2006    Priority: Medium  . IBS (irritable bowel syndrome) 06/26/2019    Priority: Low  . Aortic atherosclerosis ([place]) 02/11/2016    Priority: Low  . Eczema 11/29/2013    Priority: Low  . GERD (gastroesophageal reflux disease) 05/16/2013     Priority: Low  . Vitamin D deficiency 01/29/2012    Priority: Low  . Solitary pulmonary nodule 06/08/2011    Priority: Low  . HIATAL HERNIA WITH REFLUX 02/06/2010    Priority: Low  . DEGENERATIVE JOINT DISEASE, KNEE 03/14/2008    Priority: Low  . VARICOSE VEINS LOWER EXTREMITIES W/INFLAMMATION 03/09/2007    Priority: Low  . Allergic rhinitis 03/09/2007    Priority: Low  . ACTINIC KERATOSIS, FOREHEAD, LEFT 03/09/2007    Priority: Low  . Headache(784.0) 03/09/2007    Priority: Low  . MENIERE'S DISEASE 09/22/2006    Priority: Low  . RAYNAUD'S DISEASE 09/22/2006    Priority: Low   Past Surgical History:  Procedure Laterality Date  . arthroscopic knee  2009   left knee/ torn meniscus  . BUNIONECTOMY Right 1998   with other foot surgery   . carotid artery disection  1988   Carotid Artery Dissection  . CATARACT EXTRACTION, BILATERAL  07-18-2017,08-08-2017  . [place]   1 time  . COLONOSCOPY  2019   last 2019  . DILATION AND CURETTAGE OF UTERUS  2004  . POLYPECTOMY    . POPLITEAL SYNOVIAL CYST EXCISION     left leg  . TONSILLECTOMY  1957  .  UPPER GASTROINTESTINAL ENDOSCOPY     last 2018    Family History  Problem Relation Age of Onset  . Multiple myeloma Father   . Kidney disease Brother        cancer- removed  . Prostate cancer Brother   . Alzheimer's disease Brother   . Stroke Brother   . Thyroid cancer Sister        s/p removal. papilary and anaplastic.   . Lung cancer Sister   . Alzheimer's disease Brother        older broterh  . Stroke Brother        88-older brother  . Colon cancer Neg Hx   . Esophageal cancer Neg Hx   . Rectal cancer Neg Hx   . Stomach cancer Neg Hx   . Colon polyps Neg Hx     Medications- reviewed and updated Current Outpatient Medications  Medication Sig Dispense Refill  . amLODipine (NORVASC) 2.5 MG tablet TAKE 1 TABLET BY MOUTH EVERY DAY (Patient taking differently: Evening) 90 tablet 2  . aspirin 81 MG tablet Take  81 mg by mouth daily. Evening    . fenofibrate 160 MG tablet TAKE 1 TABLET BY MOUTH EVERY DAY 90 tablet 1  . loratadine (CLARITIN) 10 MG tablet Take 10 mg by mouth daily. Take 1/2 tablet daily. In the morning    . metoprolol tartrate (LOPRESSOR) 50 MG tablet TAKE 1/2 TABLET BY MOUTH 2 TIMES DAILY 90 tablet 1  . montelukast (SINGULAIR) 10 MG tablet TAKE 1 TABLET BY MOUTH AT BEDTIME 90 tablet 1  . pantoprazole (PROTONIX) 40 MG tablet TAKE 1 TABLET BY MOUTH EVERY DAY 90 tablet 1  . Vitamin D, Cholecalciferol, 1000 units TABS Take by mouth daily. Morning     No current facility-administered medications for this visit.    Allergies-reviewed and updated Allergies  Allergen Reactions  . Hydrocodone-Acetaminophen     VOMITING  . Azithromycin Other (See Comments)    Thrush   . Neomycin   . Penicillins     REACTION: Arm swelling  . Pneumococcal Vaccine Polyvalent     ARM REDNESS WITH TENDERNESS  . Pneumovax [Pneumococcal Polysaccharide Vaccine]     Social History   Social History Narrative   Lives with husband who is also a patient of Dr. Yong Channel   Objective  Objective:  BP 124/70   Pulse 66   Temp (!) 97.2 F (36.2 C) (Temporal)   Resp 18   Ht 5' 5" (1.651 m)   Wt 123 lb 6.4 oz (56 kg)   SpO2 95%   BMI 20.53 kg/m  Gen: NAD, resting comfortably HEENT: Mucous membranes are moist. Oropharynx normal. Some discomfort with opening jaw widely. Left nare with slight dried blood Neck: no thyromegaly or cervical lymphadenopathy CV: RRR no murmurs rubs or gallops Lungs: CTAB no crackles, wheeze, rhonchi Abdomen: soft/nontender/nondistended/normal bowel sounds. No rebound or guarding.  Ext: no edema Skin: warm, dry Neuro: grossly normal, moves all extremities, PERRLA   Assessment and Plan   79 y.o. female presenting for annual physical.  Health Maintenance counseling: 1. Anticipatory guidance: Patient counseled regarding regular dental exams - advised q6 months- she states has  been a while but will get back in, eye exams- saw Dr. Phineas Douglas yesterday ,  avoiding smoking and second hand smoke, limiting alcohol to 1 beverage per day .   2. Risk factor reduction:  Advised patient of need for regular exercise and diet rich and fruits and vegetables to reduce risk  of heart attack and stroke. Exercise- walking regularly with girlfriends. Diet-continues reasonably healthy diet- some sugar splurges- see prediabetes below.  Wt Readings from Last 3 Encounters:  12/27/19 123 lb 6.4 oz (56 kg)  10/26/19 123 lb (55.8 kg)  10/19/19 123 lb (55.8 kg)  3. Immunizations/screenings/ancillary studies-high-dose flu shot today.  Discussed Shingrix at pharmacy. discussed COVID-19 booster- [facility] and will let me know when she gets this 4 weeks out from flu shot.  Opts in hepatitis c screening Immunization History  Administered Date(s) Administered  . Fluad Quad(high Dose 65+) 12/01/2018  . Influenza Split 01/04/2012  . Influenza Whole 01/03/2007, 12/08/2007, 12/17/2008, 01/20/2010  . Influenza, High Dose Seasonal PF 01/10/2015, 12/12/2015, 12/29/2016, 12/23/2017  . Influenza,inj,Quad PF,6+ Mos 01/05/2013, 12/24/2013  . PFIZER SARS-COV-2 Vaccination 04/30/2019, 05/23/2019  . Pneumococcal Polysaccharide-23 02/12/2004  . Td 03/23/2003, 04/04/2013  4. Cervical cancer screening- passed age based screening recommendations 5. Breast cancer screening- mammogram  With [facility] 09/17/19 6. Colon cancer screening - February 2020 with no plans for repeat at this point with Dr. Silvio Pate 7. Skin cancer screening- [facility] yearly- sees in January. advised regular sunscreen use. Denies worrisome, changing, or new skin lesions.  8. Birth control/STD check- postmenopausal and mongomous 9. Osteoporosis screening at 73- osteopenia 01/02/2019 prefers 2-3 year repeat with mostly stable exam though hip fracture risk above 3%- continue weight bearing exercise, calcium- does milk and  yogurt, vitamin D -Non smoker  Status of chronic or acute concerns     # Hypertension S:compliant with amlodipine 2.34m, metoprolol 25 mg BID.   A/P:  Stable. Continue current medications.   # GERD/ barrett's esophagus finally not noted 10/2019 (had been since 2015) S:Compliant with protonix 415mdaily- using Tyler's coffee and manuka honey starting late 2019 has been very helpful A/P: great report recently- continue current meds and Dr. StFuller Planollow up every 3 years   # Hyperlipidemia/aortic atherosclerosis S:lipids have been reasonably controlled on fenofibrate.  LDL goal at least under 100 and triglyceride goal under 200.  Lab Results  Component Value Date   CHOL 135 12/26/2018   HDL 55.50 12/26/2018   LDLCALC 73 12/26/2018   LDLDIRECT 74.0 02/06/2016   TRIG 35.0 12/26/2018   CHOLHDL 2 12/26/2018  A/P: will check lipids. Discussed possible rosuvastatin 5 mg once a week if LDL still above 70. Continue fenofibrate for now.    # Hyperglycemia/insulin resistance/prediabetes S:  Medication: none.  [facility] and they did an A1c which apparently was elevated in prediabetes range -states can overdo sweets at times  A/P: We will update an A1c with lab work today.  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 Has an old mouthpiece that she can start using   # slight dysuria- will update UA and culture  #eyelashes that grow inwards worse on left eye- plans for referral [facility]- sees October 21st  # mid to upper back spasms- heating pad helpful worse over time but heating pad still helps. Has not even taken tylenol for it  # Vitamin D deficiency- on 2000  units daily- update  vitamin d with labs  #Allergic rhinitis S: Singulair works extremely well for her A/P: Well-controlled-continue current medications  Recommended follow up: Return in about 6 months (around 06/26/2020) for follow up- or sooner if needed.  Lab/Order associations: fasting   ICD-10-CM   1. Preventative health care  Z00.00   2. Primary hypertension  I10   3. Gastroesophageal reflux disease without esophagitis  K21.9   4. Hyperlipidemia, unspecified hyperlipidemia type  U98.1 COMPLETE METABOLIC PANEL WITH GFR    Lipid panel    CBC with Differential/Platelet  5. Vitamin D deficiency  E55.9 VITAMIN D 25 Hydroxy (Vit-D Deficiency, Fractures)  6. Hyperglycemia  R73.9 Hemoglobin A1c  7. Dysuria  R30.0 POCT Urinalysis Dipstick (Automated)    Urine Culture  8. Encounter for hepatitis C screening test for low risk patient  Z11.59 Hepatitis C antibody  9. Barrett's esophagus without dysplasia  K22.70     No orders of the defined types were placed in this encounter.   Return precautions advised.  Garret Reddish, MD

## 2019-12-27 ENCOUNTER — Encounter: Payer: Self-pay | Admitting: Family Medicine

## 2019-12-27 ENCOUNTER — Other Ambulatory Visit: Payer: Self-pay

## 2019-12-27 ENCOUNTER — Ambulatory Visit (INDEPENDENT_AMBULATORY_CARE_PROVIDER_SITE_OTHER): Payer: Medicare Other | Admitting: Family Medicine

## 2019-12-27 VITALS — BP 124/70 | HR 66 | Temp 97.2°F | Resp 18 | Ht 65.0 in | Wt 123.4 lb

## 2019-12-27 DIAGNOSIS — Z1159 Encounter for screening for other viral diseases: Secondary | ICD-10-CM | POA: Diagnosis not present

## 2019-12-27 DIAGNOSIS — R300 Dysuria: Secondary | ICD-10-CM

## 2019-12-27 DIAGNOSIS — Z23 Encounter for immunization: Secondary | ICD-10-CM | POA: Diagnosis not present

## 2019-12-27 DIAGNOSIS — K2270 Barrett's esophagus without dysplasia: Secondary | ICD-10-CM

## 2019-12-27 DIAGNOSIS — E559 Vitamin D deficiency, unspecified: Secondary | ICD-10-CM

## 2019-12-27 DIAGNOSIS — K219 Gastro-esophageal reflux disease without esophagitis: Secondary | ICD-10-CM

## 2019-12-27 DIAGNOSIS — I10 Essential (primary) hypertension: Secondary | ICD-10-CM | POA: Diagnosis not present

## 2019-12-27 DIAGNOSIS — Z0000 Encounter for general adult medical examination without abnormal findings: Secondary | ICD-10-CM | POA: Diagnosis not present

## 2019-12-27 DIAGNOSIS — E785 Hyperlipidemia, unspecified: Secondary | ICD-10-CM

## 2019-12-27 DIAGNOSIS — R739 Hyperglycemia, unspecified: Secondary | ICD-10-CM

## 2019-12-27 LAB — POC URINALSYSI DIPSTICK (AUTOMATED)
Bilirubin, UA: NEGATIVE
Glucose, UA: NEGATIVE
Ketones, UA: NEGATIVE
Leukocytes, UA: NEGATIVE
Nitrite, UA: NEGATIVE
Protein, UA: NEGATIVE
Spec Grav, UA: 1.02 (ref 1.010–1.025)
Urobilinogen, UA: 0.2 E.U./dL
pH, UA: 6 (ref 5.0–8.0)

## 2019-12-27 NOTE — Progress Notes (Deleted)
***  12/01/2018 awv *** cpe 12/27/19   # Hypertension S:compliant with amlodipine 2.5mg , metoprolol 25 mg BID.   A/P:  ***  # GERD/barrett's esophagus not noted 10/2019 Dr. Fuller Plan- q3 year endoscopy S:Compliant with protonix 40mg  daily- using Tyler's coffee and manuka honey starting late 2019 has been very helpful A/P: ***   # Hyperlipidemia/aortic atherosclerosis  S:lipids have been reasonably controlled on fenofibrate.  LDL goal at least under 100 and triglyceride goal under 200.  A/P: ***

## 2019-12-27 NOTE — Addendum Note (Signed)
Addended by: Thomes Cake on: 12/27/2019 02:05 PM   Modules accepted: Orders

## 2019-12-27 NOTE — Addendum Note (Signed)
Addended by: Thomes Cake on: 12/27/2019 01:05 PM   Modules accepted: Orders

## 2019-12-28 ENCOUNTER — Other Ambulatory Visit: Payer: Self-pay

## 2019-12-28 DIAGNOSIS — R319 Hematuria, unspecified: Secondary | ICD-10-CM

## 2019-12-28 LAB — CBC WITH DIFFERENTIAL/PLATELET
Absolute Monocytes: 736 cells/uL (ref 200–950)
Basophils Absolute: 51 cells/uL (ref 0–200)
Basophils Relative: 0.8 %
Eosinophils Absolute: 269 cells/uL (ref 15–500)
Eosinophils Relative: 4.2 %
HCT: 37.3 % (ref 35.0–45.0)
Hemoglobin: 12.2 g/dL (ref 11.7–15.5)
Lymphs Abs: 2490 cells/uL (ref 850–3900)
MCH: 27.8 pg (ref 27.0–33.0)
MCHC: 32.7 g/dL (ref 32.0–36.0)
MCV: 85 fL (ref 80.0–100.0)
MPV: 11.9 fL (ref 7.5–12.5)
Monocytes Relative: 11.5 %
Neutro Abs: 2854 cells/uL (ref 1500–7800)
Neutrophils Relative %: 44.6 %
Platelets: 348 10*3/uL (ref 140–400)
RBC: 4.39 10*6/uL (ref 3.80–5.10)
RDW: 13.1 % (ref 11.0–15.0)
Total Lymphocyte: 38.9 %
WBC: 6.4 10*3/uL (ref 3.8–10.8)

## 2019-12-28 LAB — LIPID PANEL
Cholesterol: 134 mg/dL (ref <200)
HDL: 61 mg/dL (ref >=50)
LDL Cholesterol (Calc): 63 mg/dL (calc)
Non-HDL Cholesterol (Calc): 73 mg/dL (calc) (ref <130)
Total CHOL/HDL Ratio: 2.2 (calc) (ref <5.0)
Triglycerides: 36 mg/dL (ref <150)

## 2019-12-28 LAB — COMPLETE METABOLIC PANEL WITH GFR
AG Ratio: 1.4 (calc) (ref 1.0–2.5)
ALT: 13 U/L (ref 6–29)
AST: 23 U/L (ref 10–35)
Albumin: 4.4 g/dL (ref 3.6–5.1)
Alkaline phosphatase (APISO): 50 U/L (ref 37–153)
BUN/Creatinine Ratio: 41 (calc) — ABNORMAL HIGH (ref 6–22)
BUN: 27 mg/dL — ABNORMAL HIGH (ref 7–25)
CO2: 21 mmol/L (ref 20–32)
Calcium: 9.7 mg/dL (ref 8.6–10.4)
Chloride: 106 mmol/L (ref 98–110)
Creat: 0.66 mg/dL (ref 0.60–0.93)
GFR, Est African American: 99 mL/min/{1.73_m2} (ref >=60)
GFR, Est Non African American: 85 mL/min/{1.73_m2} (ref >=60)
Globulin: 3.2 g/dL (calc) (ref 1.9–3.7)
Glucose, Bld: 75 mg/dL (ref 65–99)
Potassium: 4.5 mmol/L (ref 3.5–5.3)
Sodium: 141 mmol/L (ref 135–146)
Total Bilirubin: 0.5 mg/dL (ref 0.2–1.2)
Total Protein: 7.6 g/dL (ref 6.1–8.1)

## 2019-12-28 LAB — URINE CULTURE
MICRO NUMBER:: 11044472
Result:: NO GROWTH
SPECIMEN QUALITY:: ADEQUATE

## 2019-12-28 LAB — HEMOGLOBIN A1C
Hgb A1c MFr Bld: 6 % of total Hgb — ABNORMAL HIGH (ref <5.7)
Mean Plasma Glucose: 126 (calc)
eAG (mmol/L): 7 (calc)

## 2019-12-28 LAB — VITAMIN D 25 HYDROXY (VIT D DEFICIENCY, FRACTURES): Vit D, 25-Hydroxy: 27 ng/mL — ABNORMAL LOW (ref 30–100)

## 2019-12-28 LAB — HEPATITIS C ANTIBODY
Hepatitis C Ab: NONREACTIVE
SIGNAL TO CUT-OFF: 0.01 (ref <1.00)

## 2019-12-31 ENCOUNTER — Other Ambulatory Visit: Payer: Self-pay

## 2019-12-31 ENCOUNTER — Other Ambulatory Visit: Payer: Medicare Other

## 2019-12-31 DIAGNOSIS — R319 Hematuria, unspecified: Secondary | ICD-10-CM | POA: Diagnosis not present

## 2020-01-01 LAB — URINALYSIS, MICROSCOPIC ONLY
Bacteria, UA: NONE SEEN /HPF
Hyaline Cast: NONE SEEN /LPF
RBC / HPF: NONE SEEN /HPF (ref 0–2)
Squamous Epithelial / HPF: NONE SEEN /HPF (ref <=5)

## 2020-01-17 ENCOUNTER — Telehealth: Payer: Self-pay

## 2020-01-17 ENCOUNTER — Encounter: Payer: Self-pay | Admitting: Family Medicine

## 2020-01-17 ENCOUNTER — Other Ambulatory Visit: Payer: Self-pay

## 2020-01-17 ENCOUNTER — Ambulatory Visit (INDEPENDENT_AMBULATORY_CARE_PROVIDER_SITE_OTHER): Payer: Medicare Other | Admitting: Family Medicine

## 2020-01-17 VITALS — BP 126/70 | HR 69 | Temp 97.6°F | Resp 18 | Ht 65.0 in | Wt 122.6 lb

## 2020-01-17 DIAGNOSIS — R5383 Other fatigue: Secondary | ICD-10-CM | POA: Diagnosis not present

## 2020-01-17 DIAGNOSIS — Z1152 Encounter for screening for COVID-19: Secondary | ICD-10-CM

## 2020-01-17 DIAGNOSIS — R42 Dizziness and giddiness: Secondary | ICD-10-CM

## 2020-01-17 MED ORDER — FLUTICASONE PROPIONATE 50 MCG/ACT NA SUSP: 2.0000 | Freq: Every day | NASAL | 0 refills | Status: DC

## 2020-01-17 MED ORDER — MECLIZINE HCL 12.5 MG PO TABS: 12.5000 mg | ORAL_TABLET | Freq: Three times a day (TID) | ORAL | 0 refills | Status: DC | PRN

## 2020-01-17 NOTE — Telephone Encounter (Signed)
Spoke with the patient and she is able to come in at 2:20 pm to be seen by Dr. Yong Channel. She was very appreciative for the call.

## 2020-01-17 NOTE — Telephone Encounter (Signed)
Patient is scheduled for tomorrow.   Nurse Assessment Nurse: Gloriann Loan RN, Sharyn Lull Date/Time (Eastern Time): 01/17/2020 8:32:59 AM Confirm and document reason for call. If symptomatic, describe symptoms. ---Caller reports that she is having some dizziness today. Possible allergies, room is not spinning, very tired and congested though. Takes singular and Claritin. Does the patient have any new or worsening symptoms? ---Yes Will a triage be completed? ---Yes Related visit to physician within the last 2 weeks? ---No Does the PT have any chronic conditions? (i.e. diabetes, asthma, this includes High risk factors for pregnancy, etc.) ---Yes List chronic conditions. ---Allergies Is this a behavioral health or substance abuse call? ---No Guidelines Guideline Title Affirmed Question Affirmed Notes Nurse Date/Time (Eastern Time) Dizziness - Lightheadedness [1] MODERATE dizziness (e.g., interferes with normal activities) AND [2] has NOT been evaluated by physician for this (Exception: dizziness caused by heat exposure, sudden standing, or poor fluid intake) Gloriann Loan, RN, Sharyn Lull 01/17/2020 8:33:40 AM Disp. Time Eilene Ghazi Time) Disposition Final User PLEASE NOTE: All timestamps contained within this report are represented as [place] Standard Time. CONFIDENTIALTY NOTICE: This fax transmission is intended only for the addressee. It contains information that is legally privileged, confidential or otherwise protected from use or disclosure. If you are not the intended recipient, you are strictly prohibited from reviewing, disclosing, copying using or disseminating any of this information or taking any action in reliance on or regarding this information. If you have received this fax in error, please notify us immediately by telephone so that we can arrange for its return to [place]. Phone: [phone], Toll-Free: [phone], Fax: [phone] Page: 2 of 2 Call Id: [id number] 01/17/2020 8:42:19 AM See  PCP within 24 Hours Yes Gloriann Loan, RN, Julio Sicks Disagree/Comply Comply Caller Understands Yes PreDisposition Call Doctor Care Advice Given Per Guideline SEE PCP WITHIN 24 HOURS: * IF OFFICE WILL BE OPEN: You need to be examined within the next 24 hours. Call your doctor (or NP/PA) when the office opens and make an appointment. CALL BACK IF: * Passes out (faints) * You become worse CARE ADVICE given per Dizziness (Adult) guideline. Referrals REFERRED TO PCP OFFICE Warm transfer to backlin

## 2020-01-17 NOTE — Progress Notes (Signed)
Phone [phone] In person visit   Subjective:   Lori [name] is a 79 y.o. year old very pleasant female patient who presents for/with See problem oriented charting Chief Complaint  Patient presents with  . Dizziness   This visit occurred during the SARS-CoV-2 public health emergency.  Safety protocols were in place, including screening questions prior to the visit, additional usage of staff PPE, and extensive cleaning of exam room while observing appropriate contact time as indicated for disinfecting solutions.   Past Medical History-  Patient Active Problem List   Diagnosis Date Noted  . Osteoarthritis of left knee 09/15/2017    Priority: Medium  . 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 Hypertension 11/29/2013    Priority: Medium  . Hyperlipidemia 11/29/2013    Priority: Medium  . Barrett's esophagus 08/24/2013    Priority: Medium  . Osteoporosis 09/22/2006    Priority: Medium  . IBS (irritable bowel syndrome) 06/26/2019    Priority: Low  . Aortic atherosclerosis ([place]) 02/11/2016    Priority: Low  . Eczema 11/29/2013    Priority: Low  . GERD (gastroesophageal reflux disease) 05/16/2013    Priority: Low  . Vitamin D deficiency 01/29/2012    Priority: Low  . Solitary pulmonary nodule 06/08/2011    Priority: Low  . HIATAL HERNIA WITH REFLUX 02/06/2010    Priority: Low  . DEGENERATIVE JOINT DISEASE, KNEE 03/14/2008    Priority: Low  . VARICOSE VEINS LOWER EXTREMITIES W/INFLAMMATION 03/09/2007    Priority: Low  . Allergic rhinitis 03/09/2007    Priority: Low  . ACTINIC KERATOSIS, FOREHEAD, LEFT 03/09/2007    Priority: Low  . Headache(784.0) 03/09/2007    Priority: Low  . MENIERE'S DISEASE 09/22/2006    Priority: Low  . RAYNAUD'S DISEASE 09/22/2006    Priority: Low    Medications- reviewed and updated Current Outpatient Medications  Medication Sig Dispense Refill  . amLODipine (NORVASC) 2.5 MG tablet TAKE 1 TABLET BY MOUTH EVERY DAY  (Patient taking differently: Evening) 90 tablet 2  . aspirin 81 MG tablet Take 81 mg by mouth daily. Evening    . fenofibrate 160 MG tablet TAKE 1 TABLET BY MOUTH EVERY DAY 90 tablet 1  . loratadine (CLARITIN) 10 MG tablet Take 10 mg by mouth daily. Take 1/2 tablet daily. In the morning    . metoprolol tartrate (LOPRESSOR) 50 MG tablet TAKE 1/2 TABLET BY MOUTH 2 TIMES DAILY 90 tablet 1  . montelukast (SINGULAIR) 10 MG tablet TAKE 1 TABLET BY MOUTH AT BEDTIME 90 tablet 1  . pantoprazole (PROTONIX) 40 MG tablet TAKE 1 TABLET BY MOUTH EVERY DAY 90 tablet 1  . Vitamin D, Cholecalciferol, 1000 units TABS Take by mouth daily. Morning     No current facility-administered medications for this visit.     Objective:  BP 126/70   Pulse 69   Temp 97.6 F (36.4 C) (Temporal)   Resp 18   Ht 5\' 5"  (1.651 m)   Wt 122 lb 9.6 oz (55.6 kg)   SpO2 95%   BMI 20.40 kg/m  Gen: NAD, resting comfortably CV: RRR no murmurs rubs or gallops Lungs: CTAB no crackles, wheeze, rhonchi Ext: no edema Skin: warm, dry  Neuro: CN II-XII intact, sensation and reflexes normal throughout, 5/5 muscle strength in bilateral upper and lower extremities. Normal finger to nose. Normal rapid alternating movements. No pronator drift.  Normal gait.  dix hallpike positive for nystagmus though subtle on the left   EKG: sinus  rhythm with rate 74, normal axis, normal intervals (pr borderline), no hypertrophy, no st or t wave changes     Assessment and Plan    #Dizziness/off-balance possible vertigo/fatigue S: Patient reports over the last month she has been dealing with some allergies and postnasal drip.  When she has postnasal drip she gets some nausea.  Seems like her GERD and IBS have been mildly agitated.  In regards allergies has also noted some sneezing and congestion.  Starting last Friday or Saturday she noted she started to feel off balance with some episodes of perhaps mild room spinning.  She had many years years ago  but states this is not that severe.  She feels more fatigued and states also not sleeping as well-just does not feel right.  She has noted more nasal congestion in this time.  She increased to a full tablet of Claritin 1/2 tablet and that was somewhat helpful.  She is continued Singulair.  She does not take Flonase at baseline.  She at times notes worsening with change in position particularly with sitting up.  When she was set up after EKG today she noted some significant feelings of being off balance/potential vertigo  No tinnitus or hearing loss.  No facial or extremity weakness. No slurred words or trouble swallowing. no blurry vision or double vision. No paresthesias. No confusion or word finding difficulties.   A/P: Patient with dizziness/off-balance/fatigue/increased nasal congestion for approximately a week.  Neurological exam reassuring  We will test her for COVID-19 with fatigue and increased congestion.  Could have allergies triggering inner ear issues-we will add Flonase.  Trial of meclizine to see if that is helpful and will also trial some BPPV exercises-consider vestibular rehab.  I asked her to update me on his exercises are helpful tomorrow. BPPV possible but doesn't explain constant off balance feelings though does explain worsening with position changes and positive dix hallpike worse on the left. No tinnitus or hearing loss to suggest Mnire's.v estbular neuritis possible- hold off on steroids for now  I also told patient I was worried about possible stroke-discussed with her symptoms and limitations on outpatient imaging but I would recommend emergency room visit-she declines this for now and wants to see how she does with with the above treatments but she agrees to seek treatment if she has new or worsening symptoms -If we get a negative Covid test could potentially set her up with outpatient imaging such as at least CT scan but honestly may be more worthwhile to push for  MRI  Recommended follow up: as needed if acute concerns fail to improve Future Appointments  Date Time Provider [place]  06/26/2020  9:40 AM [name], MD [facility]    Lab/Order associations:   ICD-10-CM   1. Dizziness  R42 EKG 12-Lead  2. Encounter for screening for COVID-19  Z11.52 Novel Coronavirus, NAA ([facility])    Time Spent: 42 minutes of total time (2:26 PM- 3:10 PM, EKG interpretation of 2 minutes) was spent on the date of the encounter performing the following actions: chart review prior to seeing the patient, obtaining history, performing a medically necessary exam, counseling on the treatment plan, placing orders, and documenting in our EHR.   Return precautions advised.  Garret Reddish, MD

## 2020-01-17 NOTE — Patient Instructions (Signed)
There are no preventive care reminders to display for this patient.  Depression screen [facility] 2/9 12/27/2019 12/01/2018 04/25/2018  Decreased Interest 0 0 0  Down, Depressed, Hopeless 0 0 0  PHQ - 2 Score 0 0 0  Altered sleeping - - 0  Tired, decreased energy - - 0  Change in appetite - - 0  Feeling bad or failure about yourself  - - 0  Trouble concentrating - - 0  Moving slowly or fidgety/restless - - 0  Suicidal thoughts - - 0  PHQ-9 Score - - 0  Difficult doing work/chores - - Not difficult at all  Some recent data might be hidden    Recommended follow up: No follow-ups on file.

## 2020-01-17 NOTE — Telephone Encounter (Signed)
Also have 2 20 today available- would use room on Dr. Kathreen Cornfield side as reported congestion- likely n95 equipped. Room would need to be shut down afterwards. Can go ahead and test for covid if no recent test under nasal congestion.   I also mentioned this slot for one other patient on phone call- if Lori [name] takes slot today-  person could have 01/18/20 slot that we move Mrs. Gillum out of

## 2020-01-18 ENCOUNTER — Encounter (HOSPITAL_COMMUNITY): Payer: Self-pay | Admitting: Emergency Medicine

## 2020-01-18 ENCOUNTER — Emergency Department (HOSPITAL_COMMUNITY): Payer: Medicare Other

## 2020-01-18 ENCOUNTER — Other Ambulatory Visit: Payer: Self-pay

## 2020-01-18 ENCOUNTER — Emergency Department (HOSPITAL_COMMUNITY)
Admission: EM | Admit: 2020-01-18 | Discharge: 2020-01-18 | Disposition: A | Discharge status: Home / Self Care | Payer: Medicare Other | Attending: Emergency Medicine | Admitting: Emergency Medicine

## 2020-01-18 ENCOUNTER — Ambulatory Visit: Payer: Medicare Other | Admitting: Family Medicine

## 2020-01-18 DIAGNOSIS — I10 Essential (primary) hypertension: Secondary | ICD-10-CM | POA: Insufficient documentation

## 2020-01-18 DIAGNOSIS — K116 Mucocele of salivary gland: Secondary | ICD-10-CM | POA: Diagnosis not present

## 2020-01-18 DIAGNOSIS — Z79899 Other long term (current) drug therapy: Secondary | ICD-10-CM | POA: Diagnosis not present

## 2020-01-18 DIAGNOSIS — I720 Aneurysm of carotid artery: Secondary | ICD-10-CM | POA: Diagnosis not present

## 2020-01-18 DIAGNOSIS — D110 Benign neoplasm of parotid gland: Secondary | ICD-10-CM | POA: Diagnosis not present

## 2020-01-18 DIAGNOSIS — R42 Dizziness and giddiness: Secondary | ICD-10-CM | POA: Diagnosis not present

## 2020-01-18 DIAGNOSIS — J069 Acute upper respiratory infection, unspecified: Secondary | ICD-10-CM | POA: Diagnosis not present

## 2020-01-18 DIAGNOSIS — Z7982 Long term (current) use of aspirin: Secondary | ICD-10-CM | POA: Insufficient documentation

## 2020-01-18 DIAGNOSIS — I729 Aneurysm of unspecified site: Secondary | ICD-10-CM

## 2020-01-18 DIAGNOSIS — I719 Aortic aneurysm of unspecified site, without rupture: Secondary | ICD-10-CM | POA: Diagnosis not present

## 2020-01-18 DIAGNOSIS — K118 Other diseases of salivary glands: Secondary | ICD-10-CM

## 2020-01-18 LAB — CBC WITH DIFFERENTIAL/PLATELET
Abs Immature Granulocytes: 0.01 10*3/uL (ref 0.00–0.07)
Basophils Absolute: 0.1 10*3/uL (ref 0.0–0.1)
Basophils Relative: 1 %
Eosinophils Absolute: 0.2 10*3/uL (ref 0.0–0.5)
Eosinophils Relative: 4 %
HCT: 38.5 % (ref 36.0–46.0)
Hemoglobin: 12.2 g/dL (ref 12.0–15.0)
Immature Granulocytes: 0 %
Lymphocytes Relative: 36 %
Lymphs Abs: 2.2 10*3/uL (ref 0.7–4.0)
MCH: 28 pg (ref 26.0–34.0)
MCHC: 31.7 g/dL (ref 30.0–36.0)
MCV: 88.3 fL (ref 80.0–100.0)
Monocytes Absolute: 0.7 10*3/uL (ref 0.1–1.0)
Monocytes Relative: 12 %
Neutro Abs: 2.8 10*3/uL (ref 1.7–7.7)
Neutrophils Relative %: 47 %
Platelets: 345 10*3/uL (ref 150–400)
RBC: 4.36 MIL/uL (ref 3.87–5.11)
RDW: 13.4 % (ref 11.5–15.5)
WBC: 6 10*3/uL (ref 4.0–10.5)
nRBC: 0 % (ref 0.0–0.2)

## 2020-01-18 LAB — BASIC METABOLIC PANEL
Anion gap: 11 (ref 5–15)
BUN: 24 mg/dL — ABNORMAL HIGH (ref 8–23)
CO2: 26 mmol/L (ref 22–32)
Calcium: 9.9 mg/dL (ref 8.9–10.3)
Chloride: 104 mmol/L (ref 98–111)
Creatinine, Ser: 0.73 mg/dL (ref 0.44–1.00)
GFR, Estimated: >60 mL/min (ref >60)
Glucose, Bld: 129 mg/dL — ABNORMAL HIGH (ref 70–99)
Potassium: 3.6 mmol/L (ref 3.5–5.1)
Sodium: 141 mmol/L (ref 135–145)

## 2020-01-18 LAB — URINALYSIS, ROUTINE W REFLEX MICROSCOPIC
Bilirubin Urine: NEGATIVE
Glucose, UA: NEGATIVE mg/dL
Hgb urine dipstick: NEGATIVE
Ketones, ur: NEGATIVE mg/dL
Leukocytes,Ua: NEGATIVE
Nitrite: NEGATIVE
Protein, ur: NEGATIVE mg/dL
Specific Gravity, Urine: 1.018 (ref 1.005–1.030)
pH: 7 (ref 5.0–8.0)

## 2020-01-18 MED ORDER — IOHEXOL 350 MG/ML SOLN
75.0000 mL | Freq: Once | INTRAVENOUS | Status: AC | PRN
  Administered 2020-01-18: 75 mL via INTRAVENOUS

## 2020-01-18 NOTE — ED Notes (Signed)
Patient currently in CT °

## 2020-01-18 NOTE — Discharge Instructions (Addendum)
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  Continue to quarantine at home until Covid test is back and discuss further care with your primary care doctor regarding that.  Please return to the ED if symptoms worsen.

## 2020-01-18 NOTE — ED Provider Notes (Addendum)
[facility] DEPT Provider Note   CSN: [id number] Arrival date & time: 01/18/20  1138     History Chief Complaint  Patient presents with  . Dizziness    Lori [name] is a 79 y.o. female.  The history is provided by the patient.  Dizziness Quality:  Imbalance Severity:  Moderate Onset quality:  Gradual Duration:  4 days Timing:  Constant Progression:  Unchanged Chronicity:  New Relieved by:  Nothing Worsened by:  Nothing Associated symptoms: no blood in stool, no chest pain, no diarrhea, no headaches, no hearing loss, no nausea, no palpitations, no shortness of breath, no syncope, no tinnitus, no vision changes, no vomiting and no weakness   Risk factors: hx of vertigo (states does not feel the same) and Meniere's disease        Past Medical History:  Diagnosis Date  . Allergy   . Anemia    past hx of anemia  . Aneurysm ([place])    pseudo-aneurym of carotid arteries per pt  . Aortic atherosclerosis ([place])   . Arthritis    knee- DJD   . Barrett's esophagus   . Burning mouth syndrome    Dr Redmond Baseman [phone] - no smell or taste x 4 yrs per pt   . Cataract    bilateral   . 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 Eczema   . GERD (gastroesophageal reflux disease)   . Headache(784.0)   . History of IBS   . 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 Hyperlipidemia    on medication  . Hypertension   . Low back pain   . Meniere disease   . 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 Neuromuscular disorder (HCC)    Hiatal Hernia, Raynauds disease  . Osteopenia   . 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HCC)   . Stroke [facility]) 1988   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 carotid artery dissection   . Tubular adenoma of colon 02/2013  . Varicose veins with inflammation    upper and lower per pt   . Vasculitis [facility])     Patient Active Problem List   Diagnosis Date Noted  . IBS (irritable bowel syndrome) 06/26/2019  .  Osteoarthritis of left knee 09/15/2017  . 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 Aortic atherosclerosis ([place]) 02/11/2016  . Hypertension 11/29/2013  . Eczema 11/29/2013  . Hyperlipidemia 11/29/2013  . Barrett's esophagus 08/24/2013  . GERD (gastroesophageal reflux disease) 05/16/2013  . Vitamin D deficiency 01/29/2012  . Solitary pulmonary nodule 06/08/2011  . HIATAL HERNIA WITH REFLUX 02/06/2010  . DEGENERATIVE JOINT DISEASE, KNEE 03/14/2008  . VARICOSE VEINS LOWER EXTREMITIES W/INFLAMMATION 03/09/2007  . Allergic rhinitis 03/09/2007  . ACTINIC KERATOSIS, FOREHEAD, LEFT 03/09/2007  . Headache(784.0) 03/09/2007  . MENIERE'S DISEASE 09/22/2006  . RAYNAUD'S DISEASE 09/22/2006  . Osteoporosis 09/22/2006    Past Surgical History:  Procedure Laterality Date  . arthroscopic knee  2009   left knee/ torn meniscus  . BUNIONECTOMY Right 1998   with other foot surgery   . carotid artery disection  1988   Carotid Artery Dissection  . CATARACT EXTRACTION, BILATERAL  07-18-2017,08-08-2017  . [place]   1 time  . COLONOSCOPY  2019   last 2019  . DILATION AND CURETTAGE OF UTERUS  2004  . POLYPECTOMY    . POPLITEAL SYNOVIAL CYST EXCISION     left leg  . TONSILLECTOMY  1957  . UPPER GASTROINTESTINAL ENDOSCOPY  last 2018     OB History   No obstetric history on file.     Family History  Problem Relation Age of Onset  . Multiple myeloma Father   . Kidney disease Brother        cancer- removed  . Prostate cancer Brother   . Alzheimer's disease Brother   . Stroke Brother   . Thyroid cancer Sister        s/p removal. papilary and anaplastic.   . Lung cancer Sister   . Alzheimer's disease Brother        older broterh  . Stroke Brother        88-older brother  . Colon cancer Neg Hx   . Esophageal cancer Neg Hx   . Rectal cancer Neg Hx   . Stomach cancer Neg Hx   . Colon polyps Neg Hx     Social History   Tobacco Use  . Smoking status: Never Smoker  . Smokeless  tobacco: Never Used  Substance Use Topics  . Alcohol use: No    Alcohol/week: 0.0 standard drinks  . Drug use: No    Home Medications Prior to Admission medications   Medication Sig Start Date End Date Taking? Authorizing Provider  amLODipine (NORVASC) 2.5 MG tablet TAKE 1 TABLET BY MOUTH EVERY DAY Patient taking differently: Evening 08/16/19   [name], MD  aspirin 81 MG tablet Take 81 mg by mouth daily. Evening    [provider]  fenofibrate 160 MG tablet TAKE 1 TABLET BY MOUTH EVERY DAY 12/10/19   [name], MD  fluticasone [facility]) 50 MCG/ACT nasal spray Place 2 sprays into both nostrils daily. 01/17/20   [name], MD  loratadine (CLARITIN) 10 MG tablet Take 10 mg by mouth daily. Take 1/2 tablet daily. In the morning    [provider]  meclizine (ANTIVERT) 12.5 MG tablet Take 1 tablet (12.5 mg total) by mouth 3 (three) times daily as needed (off balance). 01/17/20   [name], MD  metoprolol tartrate (LOPRESSOR) 50 MG tablet TAKE 1/2 TABLET BY MOUTH 2 TIMES DAILY 11/02/19   [name], MD  montelukast (SINGULAIR) 10 MG tablet TAKE 1 TABLET BY MOUTH AT BEDTIME 11/13/19   [name], MD  pantoprazole (PROTONIX) 40 MG tablet TAKE 1 TABLET BY MOUTH EVERY DAY 12/03/19   [name], MD  Vitamin D, Cholecalciferol, 1000 units TABS Take by mouth daily. Morning    [provider]    Allergies    Hydrocodone-acetaminophen, Azithromycin, Neomycin, Penicillins, Pneumococcal vaccine polyvalent, and Pneumovax [pneumococcal polysaccharide vaccine]  Review of Systems   Review of Systems  Constitutional: Negative for chills and fever.  HENT: Negative for ear pain, hearing loss, sore throat and tinnitus.   Eyes: Negative for pain and visual disturbance.  Respiratory: Negative for cough and shortness of breath.   Cardiovascular: Negative for chest pain, palpitations and syncope.  Gastrointestinal: Negative for abdominal  pain, blood in stool, diarrhea, nausea and vomiting.  Genitourinary: Negative for dysuria and hematuria.  Musculoskeletal: Negative for arthralgias and back pain.  Skin: Negative for color change and rash.  Neurological: Positive for dizziness. Negative for seizures, syncope, weakness and headaches.  All other systems reviewed and are negative.   Physical Exam Updated Vital Signs  ED Triage Vitals  Enc Vitals Group     BP 01/18/20 1147 (!) 142/87     Pulse Rate 01/18/20 1146 70     Resp 01/18/20  1146 19     Temp 01/18/20 1147 97.6 F (36.4 C)     Temp Source 01/18/20 1147 Oral     SpO2 01/18/20 1146 96 %     Weight 01/18/20 1158 122 lb (55.3 kg)     Height 01/18/20 1158 _0  (1.676 m)     Head Circumference --      Peak Flow --      Pain Score 01/18/20 1147 0     Pain Loc --      Pain Edu? --      Excl. in [place]? --     Physical Exam Vitals and nursing note reviewed.  Constitutional:      General: She is not in acute distress.    Appearance: She is well-developed. She is not ill-appearing.  HENT:     Head: Normocephalic and atraumatic.     Nose: Nose normal.     Mouth/Throat:     Mouth: Mucous membranes are moist.  Eyes:     Extraocular Movements: Extraocular movements intact.     Conjunctiva/sclera: Conjunctivae normal.     Pupils: Pupils are equal, round, and reactive to light.  Cardiovascular:     Rate and Rhythm: Normal rate and regular rhythm.     Pulses: Normal pulses.     Heart sounds: Normal heart sounds. No murmur heard.   Pulmonary:     Effort: Pulmonary effort is normal. No respiratory distress.     Breath sounds: Normal breath sounds.  Abdominal:     Palpations: Abdomen is soft.     Tenderness: There is no abdominal tenderness.  Musculoskeletal:     Cervical back: Normal range of motion and neck supple.  Skin:    General: Skin is warm and dry.  Neurological:     General: No focal deficit present.     Mental Status: She is alert and oriented to  person, place, and time.     Cranial Nerves: No cranial nerve deficit.     Sensory: No sensory deficit.     Motor: No weakness.     Coordination: Coordination normal.     Gait: Gait abnormal (unsteady walking).     Comments: 5+ out of 5 strength throughout, normal sensation, no drift, normal finger-nose-finger, normal speech, no visual field deficit     ED Results / Procedures / Treatments   Labs (all labs ordered are listed, but only abnormal results are displayed) Labs Reviewed  BASIC METABOLIC PANEL - Abnormal; Notable for the following components:      Result Value   Glucose, Bld 129 (*)    BUN 24 (*)    All other components within normal limits  CBC WITH DIFFERENTIAL/PLATELET  URINALYSIS, ROUTINE W REFLEX MICROSCOPIC    EKG None  Radiology CT Angio Head W or Wo Contrast  Addendum Date: 01/18/2020   ADDENDUM REPORT: 01/18/2020 15:01 ADDENDUM: On further review there is a mass within the deep aspect of the left parotid gland, which measures up to 1.6 cm. This is better characterized on same day MRI. Recommend outpatient ENT consultation for management given this could represent a benign or malignant mass. Updated findings discussed with Dr. Ronnald Nian at 2:55 PM. Electronically Signed   By: Margaretha Sheffield MD   On: 01/18/2020 15:01   Result Date: 01/18/2020 CLINICAL DATA:  Dizziness.  Vertigo. EXAM: CT HEAD WITHOUT CONTRAST CT ANGIOGRAPHY OF THE HEAD AND NECK TECHNIQUE: Contiguous axial images were obtained from the base of the skull through the  vertex without intravenous contrast. Multidetector CT imaging of the head and neck was performed using the standard protocol during bolus administration of intravenous contrast. Multiplanar CT image reconstructions and MIPs were obtained to evaluate the vascular anatomy. Carotid stenosis measurements (when applicable) are obtained utilizing NASCET criteria, using the distal internal carotid diameter as the denominator. CONTRAST:  66m  OMNIPAQUE IOHEXOL 350 MG/ML SOLN COMPARISON:  IR arteriogram from February 07, 2001. FINDINGS: CT HEAD Brain: There is focal hypo density in the paramidline right frontal cortex (series 4, image 22) with mild volume loss, suggestive of prior infarct. Remote appearing lacunar infarct in the right cerebellar hemisphere and right thalamus. No acute hemorrhage. No hydrocephalus. No mass lesion or abnormal mass effect. No extra-axial fluid collections. Patchy white matter hypoattenuation, most likely secondary to chronic microvascular ischemic disease. Mild generalized cerebral atrophy with ex vacuo ventricular dilation. Vascular: Calcific atherosclerosis. Skull: No acute fracture. Sinuses/Orbits: Visualized sinuses are clear.  Unremarkable orbits. CTA NECK Aortic arch: Great vessel origins are patent. Calcific atherosclerosis. Right carotid system: There is a large pseudoaneurysm rising from the internal carotid artery at the skull base, which is medially directed and measures approximately 10 x 8 by 16 mm (see series 12, image 268 and series 13, image 130). This pseudoaneurysm is well opacified with calcification along its periphery and communicates widely with the internal carotid artery, which is mildly with narrowed. No evidence of significant stenosis or occlusion within the neck. Mild atherosclerosis at the bifurcation. Left carotid system: There is a pseudoaneurysm of the left internal carotid artery at the skull base, which measures approximately 9 x 5 by 9 mm (see series 12, image 267 and series 13, image 133). This pseudoaneurysm is well opacified and has peripheral calcification. There is wide communication with the internal carotid artery which is mildly narrowed. No evidence of significant stenosis or occlusion in the neck. Mild atherosclerosis at the bifurcation. Vertebral arteries:Left dominant. No evidence of significant stenosis or occlusion within the neck. Mild calcific atherosclerosis at the left  vertebral artery origin. Skeleton: Moderate multilevel degenerative change of the cervical spine. Other neck: Bilateral thyroid nodules, measuring up to 1 cm. These do not require further imaging follow-up per current guidelines. Chest: Scattered ground-glass opacities in the visualized upper lungs. Approximately 6 mm ground-glass pulmonary nodule in the left upper lobe, similar to prior CT chest from December 23, 2015. CTA HEAD Anterior circulation: No evidence of significant stenosis or large vessel occlusion. There is a small 2 mm posteromedially directed aneurysm arising from the right paraclinoid ICA (see series 12, image 290). Posterior circulation: No evidence of significant focal stenosis or large vessel occlusion. Small right posterior communicating artery. No aneurysm. Venous sinuses: No evidence of dural sinus thrombosis. IMPRESSION: 1. Remote appearing infarcts in the paramidline right frontal cortex, right cerebellar hemisphere, and right thalamus. No evidence of acute large vascular territory infarct. MRI could better evaluate for acute infarct, if clinically indicated. 2. 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 No CTA exam is available for direct comparison to evaluate for change in size. Resulting mild ICA stenosis bilaterally. 3. No evidence of hemodynamically significant stenosis or large vessel occlusion in the head or neck. 4. Approximately 2 mm posteromedially directed aneurysm arising from the right paraclinoid ICA (see series 12, image 290). Recommend follow-up CTA in 1 year to ensure stability. 5. Scattered ground-glass opacities in the imaged upper lungs, which may  relate to poor inspiration. Recommend dedicated chest radiograph to further evaluate. 6. Stable 6 mm ground-glass nodule in the left upper lobe. Findings discussed with Dr. Ronnald Nian at 2:38 p.m via telephone.  Electronically Signed: By: Margaretha Sheffield MD On: 01/18/2020 14:45   CT Angio Neck W and/or Wo Contrast  Addendum Date: 01/18/2020   ADDENDUM REPORT: 01/18/2020 15:01 ADDENDUM: On further review there is a mass within the deep aspect of the left parotid gland, which measures up to 1.6 cm. This is better characterized on same day MRI. Recommend outpatient ENT consultation for management given this could represent a benign or malignant mass. Updated findings discussed with Dr. Ronnald Nian at 2:55 PM. Electronically Signed   By: Margaretha Sheffield MD   On: 01/18/2020 15:01   Result Date: 01/18/2020 CLINICAL DATA:  Dizziness.  Vertigo. EXAM: CT HEAD WITHOUT CONTRAST CT ANGIOGRAPHY OF THE HEAD AND NECK TECHNIQUE: Contiguous axial images were obtained from the base of the skull through the vertex without intravenous contrast. Multidetector CT imaging of the head and neck was performed using the standard protocol during bolus administration of intravenous contrast. Multiplanar CT image reconstructions and MIPs were obtained to evaluate the vascular anatomy. Carotid stenosis measurements (when applicable) are obtained utilizing NASCET criteria, using the distal internal carotid diameter as the denominator. CONTRAST:  69m OMNIPAQUE IOHEXOL 350 MG/ML SOLN COMPARISON:  IR arteriogram from February 07, 2001. FINDINGS: CT HEAD Brain: There is focal hypo density in the paramidline right frontal cortex (series 4, image 22) with mild volume loss, suggestive of prior infarct. Remote appearing lacunar infarct in the right cerebellar hemisphere and right thalamus. No acute hemorrhage. No hydrocephalus. No mass lesion or abnormal mass effect. No extra-axial fluid collections. Patchy white matter hypoattenuation, most likely secondary to chronic microvascular ischemic disease. Mild generalized cerebral atrophy with ex vacuo ventricular dilation. Vascular: Calcific atherosclerosis. Skull: No acute fracture. Sinuses/Orbits:  Visualized sinuses are clear.  Unremarkable orbits. CTA NECK Aortic arch: Great vessel origins are patent. Calcific atherosclerosis. Right carotid system: There is a large pseudoaneurysm rising from the internal carotid artery at the skull base, which is medially directed and measures approximately 10 x 8 by 16 mm (see series 12, image 268 and series 13, image 130). This pseudoaneurysm is well opacified with calcification along its periphery and communicates widely with the internal carotid artery, which is mildly with narrowed. No evidence of significant stenosis or occlusion within the neck. Mild atherosclerosis at the bifurcation. Left carotid system: There is a pseudoaneurysm of the left internal carotid artery at the skull base, which measures approximately 9 x 5 by 9 mm (see series 12, image 267 and series 13, image 133). This pseudoaneurysm is well opacified and has peripheral calcification. There is wide communication with the internal carotid artery which is mildly narrowed. No evidence of significant stenosis or occlusion in the neck. Mild atherosclerosis at the bifurcation. Vertebral arteries:Left dominant. No evidence of significant stenosis or occlusion within the neck. Mild calcific atherosclerosis at the left vertebral artery origin. Skeleton: Moderate multilevel degenerative change of the cervical spine. Other neck: Bilateral thyroid nodules, measuring up to 1 cm. These do not require further imaging follow-up per current guidelines. Chest: Scattered ground-glass opacities in the visualized upper lungs. Approximately 6 mm ground-glass pulmonary nodule in the left upper lobe, similar to prior CT chest from December 23, 2015. CTA HEAD Anterior circulation: No evidence of significant stenosis or large vessel occlusion. There is a small 2 mm posteromedially directed aneurysm arising from the  right paraclinoid ICA (see series 12, image 290). Posterior circulation: No evidence of significant focal stenosis  or large vessel occlusion. Small right posterior communicating artery. No aneurysm. Venous sinuses: No evidence of dural sinus thrombosis. IMPRESSION: 1. Remote appearing infarcts in the paramidline right frontal cortex, right cerebellar hemisphere, and right thalamus. No evidence of acute large vascular territory infarct. MRI could better evaluate for acute infarct, if clinically indicated. 2. 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 No CTA exam is available for direct comparison to evaluate for change in size. Resulting mild ICA stenosis bilaterally. 3. No evidence of hemodynamically significant stenosis or large vessel occlusion in the head or neck. 4. Approximately 2 mm posteromedially directed aneurysm arising from the right paraclinoid ICA (see series 12, image 290). Recommend follow-up CTA in 1 year to ensure stability. 5. Scattered ground-glass opacities in the imaged upper lungs, which may relate to poor inspiration. Recommend dedicated chest radiograph to further evaluate. 6. Stable 6 mm ground-glass nodule in the left upper lobe. Findings discussed with Dr. Ronnald Nian at 2:38 p.m via telephone. Electronically Signed: By: Margaretha Sheffield MD On: 01/18/2020 14:45   MR BRAIN WO CONTRAST  Result Date: 01/18/2020 CLINICAL DATA:  Neuro deficit, acute stroke suspected. Dizziness. Headaches. Slurred speech. EXAM: MRI HEAD WITHOUT CONTRAST TECHNIQUE: Multiplanar, multiecho pulse sequences of the brain and surrounding structures were obtained without intravenous contrast. COMPARISON:  None. FINDINGS: Brain: No acute infarction, hemorrhage, hydrocephalus, extra-axial collection or mass lesion. Small remote infarcts in the right paramidline frontal cortex, the right thalamus, and the right cerebellar hemisphere. Scattered T2/FLAIR hyperintensities within the white matter, compatible with  chronic microvascular ischemic disease. Vascular: Please see same day CTA for characterization of the vasculature, including bilateral skull base ICA pseudoaneurysms. Skull and upper cervical spine: Normal marrow signal. Sinuses/Orbits: No acute findings. Other: There is an approximately 1.6 x 1.5 cm T2 hyperintense lesion within the deep aspect of the left parotid gland. No mastoid effusions. IMPRESSION: 1. No evidence of acute intracranial abnormality. Specifically, no acute infarct. 2. Multiple small remote infarcts and chronic microvascular ischemic disease. 3. Approximately 1.6 cm left parotid mass. Recommend outpatient ENT consultation for management given this could represent a benign or malignant lesion. Findings and recommendations discussed with Dr. Ronnald Nian at 2:55 PM. Electronically Signed   By: Margaretha Sheffield MD   On: 01/18/2020 14:59    Procedures Procedures (including critical care time)  Medications Ordered in ED Medications  iohexol (OMNIPAQUE) 350 MG/ML injection 75 mL (75 mLs Intravenous Contrast Given 01/18/20 1347)    ED Course  I have reviewed the triage vital signs and the nursing notes.  Pertinent labs & imaging results that were available during my care of the patient were reviewed by me and considered in my medical decision making (see chart for details).    MDM Rules/Calculators/A&P                          Lori [name] is a 79 year old female with history of pseudoaneurysms of bilateral carotid arteries, stroke, hypertension who presents to the ED with dizziness and balance issues.  Patient was sent for MRI as she has had symptoms for the last 4 days.  She has had some URI symptoms and did have a Covid test done yesterday that is still pending.  She has not had cough or shortness of breath.  Patient overall appears well.  When she gets up and walks around the room she is feeling slightly off balance.  Otherwise she is neurologically intact.  Given her history  of carotid pseudoaneurysms will get a CTA of the head and neck and will obtain an MRI of the brain.  Lab work otherwise showed no significant anemia, electrolyte abnormality, kidney injury.  Lab work unremarkable.  CT angio of head and neck overall does not show any significant stenosis or large vessel occlusion.  Radiology called me in the phone to discuss both CTA and MRI.  She does have what appears to be bilateral internal carotid pseudoaneurysms at the base of the skull which are similar to findings from arteriogram in 2002.  She likely has a small 2 mm posterior medial directed aneurysm arising from the right paraclinoid ICA will need this to be followed likely in a year.  We will give her information to follow-up with neurosurgery but also could have her primary care doctor continue to manage this as well.  She also has some scattered groundglass opacities in her upper lungs and they are recommending likely chest x-ray.  MRI did not show any acute stroke however did show 1.6 cm left parotid gland mass.  This could be benign or malignant and will have her follow-up with ENT.  Patient did have Covid test yesterday.  However does not have any respiratory symptoms, no cough.  Normal room air oxygenation.  At this time we will get a dedicated chest x-ray to make sure there is any type of focal pneumonia.  She was given Covid education in case her Covid test is positive.  Chest x-ray was clear.  Discharged in good condition.  Understands return precautions.  This chart was dictated using voice recognition software.  Despite best efforts to proofread,  errors can occur which can change the documentation meaning.  Lori [name] was evaluated in Emergency Department on 01/18/2020 for the symptoms described in the history of present illness. She was evaluated in the context of the global COVID-19 pandemic, which necessitated consideration that the patient might be at risk for infection with the SARS-CoV-2  virus that causes COVID-19. Institutional protocols and algorithms that pertain to the evaluation of patients at risk for COVID-19 are in a state of rapid change based on information released by regulatory bodies including the CDC and federal and state organizations. These policies and algorithms were followed during the patient's care in the ED.     Final Clinical Impression(s) / ED Diagnoses Final diagnoses:  Mass of left parotid gland  Aneurysm [facility])  Dizziness    Rx / DC Orders ED Discharge Orders    None       Lennice Sites, DO 01/18/20 [place], [place], DO 01/18/20 1537

## 2020-01-18 NOTE — ED Triage Notes (Addendum)
Patient reports sneezing, runny nose and vertigo x4 days. Saw her PCP yesterday and did a COVID-19 test (no results yet), he recommended she goes to ED to get a CT or MRI. Reports her equilibrium is off, needing to use walls to walk. Took meclizine w/o relief.  Reports EKG was normal at PCP yesterday.

## 2020-01-18 NOTE — ED Notes (Addendum)
Pt. Refused EKG, Pt. Stated that her doctor did one yesterday and everything looked fine.

## 2020-01-21 ENCOUNTER — Telehealth: Payer: Self-pay

## 2020-01-21 NOTE — Telephone Encounter (Signed)
Noted  

## 2020-01-21 NOTE — Telephone Encounter (Signed)
Pt is requesting an appointment this week, due to her most recent test results that were done at the hospital. Pt states she needs to set up an appt. With a neurosurgeon, but wants to consult with Dr. Yong [name] first. Appointments are full this week, other than a sameday Friday. Please advise

## 2020-01-21 NOTE — Telephone Encounter (Signed)
Had a cancellation and scheduled pt for this Thursday

## 2020-01-22 ENCOUNTER — Telehealth: Payer: Self-pay

## 2020-01-22 DIAGNOSIS — D490 Neoplasm of unspecified behavior of digestive system: Secondary | ICD-10-CM | POA: Diagnosis not present

## 2020-01-22 DIAGNOSIS — R42 Dizziness and giddiness: Secondary | ICD-10-CM | POA: Insufficient documentation

## 2020-01-22 DIAGNOSIS — D110 Benign neoplasm of parotid gland: Secondary | ICD-10-CM | POA: Diagnosis not present

## 2020-01-22 LAB — NOVEL CORONAVIRUS, NAA: SARS-CoV-2, NAA: NOT DETECTED

## 2020-01-22 NOTE — Patient Instructions (Addendum)
Start home exercises  Follow up with Dr. Saintclair Halsted  If you feel stable- can restart your exercise  Recommended follow up:  Keep April visit or sooner if needed

## 2020-01-22 NOTE — Telephone Encounter (Signed)
Covid test is still pending

## 2020-01-22 NOTE — Telephone Encounter (Signed)
Pt was calling to check on covid test results. Tammy is going to look into it
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
## 2020-01-24 ENCOUNTER — Ambulatory Visit (INDEPENDENT_AMBULATORY_CARE_PROVIDER_SITE_OTHER): Payer: Medicare Other | Admitting: Family Medicine

## 2020-01-24 ENCOUNTER — Other Ambulatory Visit: Payer: Self-pay

## 2020-01-24 ENCOUNTER — Encounter: Payer: Self-pay | Admitting: Family Medicine

## 2020-01-24 VITALS — BP 122/70 | HR 60 | Temp 97.7°F | Resp 18 | Ht 65.0 in | Wt 121.4 lb

## 2020-01-24 DIAGNOSIS — J301 Allergic rhinitis due to pollen: Secondary | ICD-10-CM | POA: Diagnosis not present

## 2020-01-24 DIAGNOSIS — E785 Hyperlipidemia, unspecified: Secondary | ICD-10-CM

## 2020-01-24 DIAGNOSIS — I10 Essential (primary) hypertension: Secondary | ICD-10-CM | POA: Diagnosis not present

## 2020-01-24 DIAGNOSIS — R42 Dizziness and giddiness: Secondary | ICD-10-CM

## 2020-01-28 ENCOUNTER — Encounter: Payer: Self-pay | Admitting: Family Medicine

## 2020-01-29 ENCOUNTER — Other Ambulatory Visit: Payer: Self-pay | Admitting: Family Medicine

## 2020-01-31 DIAGNOSIS — I671 Cerebral aneurysm, nonruptured: Secondary | ICD-10-CM | POA: Diagnosis not present

## 2020-03-06 ENCOUNTER — Other Ambulatory Visit: Payer: Self-pay | Admitting: Family Medicine

## 2020-04-03 DIAGNOSIS — D1801 Hemangioma of skin and subcutaneous tissue: Secondary | ICD-10-CM | POA: Diagnosis not present

## 2020-04-03 DIAGNOSIS — L821 Other seborrheic keratosis: Secondary | ICD-10-CM | POA: Diagnosis not present

## 2020-04-03 DIAGNOSIS — L578 Other skin changes due to chronic exposure to nonionizing radiation: Secondary | ICD-10-CM | POA: Diagnosis not present

## 2020-04-03 DIAGNOSIS — L814 Other melanin hyperpigmentation: Secondary | ICD-10-CM | POA: Diagnosis not present

## 2020-05-13 ENCOUNTER — Other Ambulatory Visit: Payer: Self-pay | Admitting: Family Medicine

## 2020-05-29 ENCOUNTER — Other Ambulatory Visit: Payer: Self-pay | Admitting: Family Medicine

## 2020-06-02 ENCOUNTER — Telehealth: Payer: Self-pay

## 2020-06-02 NOTE — Telephone Encounter (Signed)
Yes you can use a same day

## 2020-06-02 NOTE — Telephone Encounter (Signed)
Patient is scheduled   

## 2020-06-02 NOTE — Telephone Encounter (Signed)
Patient is calling in stating she has IBS and it has been getting worse, does not feel like she can wait until her physical in April, can we use same day for Monday to get patient in sooner?

## 2020-06-04 NOTE — Progress Notes (Deleted)
Phone [phone] In person visit   Subjective:   Lori [name] is a 80 y.o. year old very pleasant female patient who presents for/with See problem oriented charting No chief complaint on file.   This visit occurred during the SARS-CoV-2 public health emergency.  Safety protocols were in place, including screening questions prior to the visit, additional usage of staff PPE, and extensive cleaning of exam room while observing appropriate contact time as indicated for disinfecting solutions.   Past Medical History-  Patient Active Problem List   Diagnosis Date Noted  . IBS (irritable bowel syndrome) 06/26/2019  . Osteoarthritis of left knee 09/15/2017  . 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 Aortic atherosclerosis ([place]) 02/11/2016  . Hypertension 11/29/2013  . Eczema 11/29/2013  . Hyperlipidemia 11/29/2013  . Barrett's esophagus 08/24/2013  . GERD (gastroesophageal reflux disease) 05/16/2013  . Vitamin D deficiency 01/29/2012  . Solitary pulmonary nodule 06/08/2011  . HIATAL HERNIA WITH REFLUX 02/06/2010  . DEGENERATIVE JOINT DISEASE, KNEE 03/14/2008  . VARICOSE VEINS LOWER EXTREMITIES W/INFLAMMATION 03/09/2007  . Allergic rhinitis 03/09/2007  . ACTINIC KERATOSIS, FOREHEAD, LEFT 03/09/2007  . Headache(784.0) 03/09/2007  . MENIERE'S DISEASE 09/22/2006  . RAYNAUD'S DISEASE 09/22/2006  . Osteoporosis 09/22/2006    Medications- reviewed and updated Current Outpatient Medications  Medication Sig Dispense Refill  . amLODipine (NORVASC) 2.5 MG tablet TAKE 1 TABLET BY MOUTH EVERY DAY 90 tablet 2  . aspirin 81 MG tablet Take 81 mg by mouth daily. Evening    . fenofibrate 160 MG tablet TAKE 1 TABLET BY MOUTH EVERY DAY 90 tablet 1  . fluticasone (FLONASE) 50 MCG/ACT nasal spray Place 2 sprays into both nostrils daily. 16 g 0  . loratadine (CLARITIN) 10 MG tablet Take 10 mg by mouth daily. Take 1/2 tablet daily. In the morning    . meclizine (ANTIVERT) 12.5 MG tablet Take 1 tablet  (12.5 mg total) by mouth 3 (three) times daily as needed (off balance). 30 tablet 0  . metoprolol tartrate (LOPRESSOR) 50 MG tablet TAKE 1/2 TABLET BY MOUTH 2 TIMES DAILY 90 tablet 1  . montelukast (SINGULAIR) 10 MG tablet TAKE 1 TABLET BY MOUTH AT BEDTIME 90 tablet 1  . pantoprazole (PROTONIX) 40 MG tablet TAKE 1 TABLET BY MOUTH EVERY DAY 90 tablet 1  . Vitamin D, Cholecalciferol, 1000 units TABS Take by mouth daily. Morning     No current facility-administered medications for this visit.     Objective:  There were no vitals taken for this visit. Gen: NAD, resting comfortably CV: RRR no murmurs rubs or gallops Lungs: CTAB no crackles, wheeze, rhonchi Abdomen: soft/nontender/nondistended/normal bowel sounds. No rebound or guarding.  Ext: no edema Skin: warm, dry Neuro: grossly normal, moves all extremities  ***    Assessment and Plan  IBS F/U S:***  A/P: ***     ***12/01/2018 awv *** cpe 12/27/19   # Hypertension S:compliant with amlodipine 2.5mg , metoprolol 25 mg BID.   A/P:  ***  # GERD/barrett's esophagus not noted 10/2019 Dr. Fuller Plan- q3 year endoscopy S:Compliant with protonix 40mg  daily- using [employer] coffee and manuka honey starting late 2019 has been very helpful A/P: ***   # Hyperlipidemia/aortic atherosclerosis  S:lipids have been reasonably controlled on fenofibrate.  LDL goal at least under 100 and triglyceride goal under 200.  A/P: ***    # TMJD - intermittent issues right jaw. Has a mouthpiece she can use ***  #eyelashes that grow inwards worse on left eye- plans for referral to Dr. Brigitte Pulse  [facility]- sees October 21st 2021 for possible procedure ***  # mid to upper back spasms- heating pad helpful worse over time but heating pad still helps. Has not even taken tylenol for it ***  # Vitamin D deficiency- on 2000 units daily ***  #Allergic rhinitis S: Singulair works extremely well for her *** A/P: ***     No problem-specific Assessment &  Plan notes found for this encounter.   Recommended follow up: ***No follow-ups on file. Future Appointments  Date Time Provider [place]  06/09/2020  2:20 PM [name], MD [facility]  06/26/2020  9:40 AM [name], MD [facility]    Lab/Order associations: No diagnosis found.  No orders of the defined types were placed in this encounter.   Time Spent: *** minutes of total time (7:52 PM***- 7:52 PM***) was spent on the date of the encounter performing the following actions: chart review prior to seeing the patient, obtaining history, performing a medically necessary exam, counseling on the treatment plan, placing orders, and documenting in our EHR.   Return precautions advised.  Clyde Lundborg, CMA

## 2020-06-04 NOTE — Patient Instructions (Incomplete)
Depression screen [facility] 2/9 12/27/2019 12/01/2018 04/25/2018  Decreased Interest 0 0 0  Down, Depressed, Hopeless 0 0 0  PHQ - 2 Score 0 0 0  Altered sleeping - - 0  Tired, decreased energy - - 0  Change in appetite - - 0  Feeling bad or failure about yourself  - - 0  Trouble concentrating - - 0  Moving slowly or fidgety/restless - - 0  Suicidal thoughts - - 0  PHQ-9 Score - - 0  Difficult doing work/chores - - Not difficult at all  Some recent data might be hidden

## 2020-06-05 DIAGNOSIS — R0982 Postnasal drip: Secondary | ICD-10-CM | POA: Diagnosis not present

## 2020-06-05 DIAGNOSIS — H9313 Tinnitus, bilateral: Secondary | ICD-10-CM | POA: Diagnosis not present

## 2020-06-05 DIAGNOSIS — D490 Neoplasm of unspecified behavior of digestive system: Secondary | ICD-10-CM | POA: Diagnosis not present

## 2020-06-09 ENCOUNTER — Ambulatory Visit: Payer: Medicare Other | Admitting: Family Medicine

## 2020-06-09 DIAGNOSIS — K589 Irritable bowel syndrome without diarrhea: Secondary | ICD-10-CM

## 2020-06-23 NOTE — Patient Instructions (Addendum)
Please stop by lab before you go If you have mychart- we will send your results within 3 business days of [place] receiving them.  If you do not have mychart- we will call you about results within 5 business days of [place] receiving them.  *please also note that you will see labs on mychart as soon as they post. I will later go in and write notes on them- will say "notes from Dr. Yong Channel"  No changes today unless labs lead [place] to make changes

## 2020-06-23 NOTE — Progress Notes (Signed)
Phone [phone] In person visit   Subjective:   Lori [name] is a 80 y.o. year old very pleasant female patient who presents for/with See problem oriented charting Chief Complaint  Patient presents with  . Hypertension  . Gastroesophageal Reflux  . Hyperlipidemia   This visit occurred during the SARS-CoV-2 public health emergency.  Safety protocols were in place, including screening questions prior to the visit, additional usage of staff PPE, and extensive cleaning of exam room while observing appropriate contact time as indicated for disinfecting solutions.   Past Medical History-  Patient Active Problem List   Diagnosis Date Noted  . 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 Osteoarthritis of left knee 09/15/2017    Priority: Medium  . 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 Hypertension 11/29/2013    Priority: Medium  . Hyperlipidemia 11/29/2013    Priority: Medium  . Barrett's esophagus 08/24/2013    Priority: Medium  . Allergic rhinitis 03/09/2007    Priority: Medium  . Osteoporosis 09/22/2006    Priority: Medium  . IBS (irritable bowel syndrome) 06/26/2019    Priority: Low  . Aortic atherosclerosis ([place]) 02/11/2016    Priority: Low  . Eczema 11/29/2013    Priority: Low  . GERD (gastroesophageal reflux disease) 05/16/2013    Priority: Low  . Vitamin D deficiency 01/29/2012    Priority: Low  . Solitary pulmonary nodule 06/08/2011    Priority: Low  . HIATAL HERNIA WITH REFLUX 02/06/2010    Priority: Low  . DEGENERATIVE JOINT DISEASE, KNEE 03/14/2008    Priority: Low  . VARICOSE VEINS LOWER EXTREMITIES W/INFLAMMATION 03/09/2007    Priority: Low  . ACTINIC KERATOSIS, FOREHEAD, LEFT 03/09/2007    Priority: Low  . Headache(784.0) 03/09/2007    Priority: Low  . MENIERE'S DISEASE 09/22/2006    Priority: Low  . RAYNAUD'S DISEASE 09/22/2006    Priority: Low    Medications- reviewed and updated Current Outpatient Medications   Medication Sig Dispense Refill  . amLODipine (NORVASC) 2.5 MG tablet TAKE 1 TABLET BY MOUTH EVERY DAY 90 tablet 2  . aspirin 81 MG tablet Take 81 mg by mouth daily. Evening    . fenofibrate 160 MG tablet TAKE 1 TABLET BY MOUTH EVERY DAY 90 tablet 1  . loratadine (CLARITIN) 10 MG tablet Take 10 mg by mouth daily. Take 1/2 tablet daily    . meclizine (ANTIVERT) 12.5 MG tablet Take 1 tablet (12.5 mg total) by mouth 3 (three) times daily as needed (off balance). 30 tablet 0  . metoprolol tartrate (LOPRESSOR) 50 MG tablet TAKE 1/2 TABLET BY MOUTH 2 TIMES DAILY 90 tablet 1  . montelukast (SINGULAIR) 10 MG tablet TAKE 1 TABLET BY MOUTH AT BEDTIME 90 tablet 1  . pantoprazole (PROTONIX) 40 MG tablet TAKE 1 TABLET BY MOUTH EVERY DAY 90 tablet 1  . Vitamin D, Cholecalciferol, 1000 units TABS Take by mouth daily. Morning    . fluticasone (FLONASE) 50 MCG/ACT nasal spray Place 2 sprays into both nostrils daily. (Patient not taking: Reported on 06/26/2020) 16 g 0   No current facility-administered medications for this visit.     Objective:  BP 130/70   Pulse 72   Temp (!) 97 F (36.1 C) (Temporal)   Wt 119 lb (54 kg)   SpO2 96%   BMI 19.80 kg/m  Gen: NAD, resting comfortably CV: RRR no murmurs rubs or gallops Lungs: CTAB no crackles, wheeze, rhonchi Abdomen: soft/nontender/nondistended/normal bowel sounds. Ext: no  edema Skin: warm, dry    Assessment and Plan    #Dizziness- overall has been better- had mild episode last week- if looked up or down seemed to bother her some. Didn't feel room spinning.  extensive eval back in the fall. claritin interestingly enough helped her.  - small cerebral aneurysm- yearly follow up planned with Dr. Kathyrn Sheriff  # Hypertension S:compliant with amlodipine 2.5mg , metoprolol 25 mg BID.   -walking daily with friends and dogs BP Readings from Last 3 Encounters:  06/26/20 130/70  01/24/20 122/70  01/18/20 (!) 144/67  A/P: Stable. Continue current medications.    # GERD/barrett's esophagus not noted 10/2019 Dr. Fuller Plan- q3 year endoscopy S:Compliant with protonix 40mg  daily- using [employer] coffee and manuka honey starting late 2019 has been very helpful A/P: doing well recently- continue current meds   # Hyperlipidemia/aortic atherosclerosis  S:lipids have been reasonably controlled on fenofibrate.  LDL goal at least under 100 and triglyceride goal under 200- most recently under 70 Lab Results  Component Value Date   CHOL 134 12/27/2019   HDL 61 12/27/2019   LDLCALC 63 12/27/2019   LDLDIRECT 74.0 02/06/2016   TRIG 36 12/27/2019   CHOLHDL 2.2 12/27/2019  A/P: on last check doing well with LDL under 70- ideal control and good control for aortic atherosclerosis    # IBS type symptoms- she tries to eat healthy. She gets bloating, gas, frequent BMs- has had for years but seems slightly worse. Would like for her to avoid further weight loss- IBS makes it harder for her. Has to go to simple foods which are not as good for prediabetes  # Hyperglycemia/insulin resistance/prediabetes S:  Medication: none Exercise and diet- walking regularly, eating healthy but want her to avoid weight loss Lab Results  Component Value Date   HGBA1C 6.0 (H) 12/27/2019   HGBA1C 6.2 (H) 10/07/2006   A/P: will check a1c with labs but on other hand do not want her to lose more weight.   #eyelashes that grow inwards worse on left eye-  Upcoming visit  [facility]  # Vitamin D deficiency- on 2000 units daily - had to go back to 1000 units as upset stomach. Hoping improved with short course of 2000 units- didn't tolerate 50k units in past.  Lab Results  Component Value Date   VD25OH 27 (L) 12/27/2019   Recommended follow up: Return in about 6 months (around 12/26/2020) for physical or sooner if needed.  Lab/Order associations:   ICD-10-CM   1. Primary hypertension  I10   2. Gastroesophageal reflux disease without esophagitis  K21.9   3. Vitamin D  deficiency  E55.9   4. Hyperlipidemia, unspecified hyperlipidemia type  E78.5   5. 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 Hyperglycemia  R73.9   7. High risk medication use  Z79.899     No orders of the defined types were placed in this encounter.   Return precautions advised.  Garret Reddish, MD

## 2020-06-26 ENCOUNTER — Ambulatory Visit (INDEPENDENT_AMBULATORY_CARE_PROVIDER_SITE_OTHER): Payer: Medicare Other | Admitting: Family Medicine

## 2020-06-26 ENCOUNTER — Other Ambulatory Visit: Payer: Self-pay

## 2020-06-26 ENCOUNTER — Encounter: Payer: Self-pay | Admitting: Family Medicine

## 2020-06-26 VITALS — BP 130/70 | HR 72 | Temp 97.0°F | Wt 119.0 lb

## 2020-06-26 DIAGNOSIS — K219 Gastro-esophageal reflux disease without esophagitis: Secondary | ICD-10-CM | POA: Diagnosis not present

## 2020-06-26 DIAGNOSIS — R739 Hyperglycemia, unspecified: Secondary | ICD-10-CM

## 2020-06-26 DIAGNOSIS — E785 Hyperlipidemia, unspecified: Secondary | ICD-10-CM | POA: Diagnosis not present

## 2020-06-26 DIAGNOSIS — Z79899 Other long term (current) drug therapy: Secondary | ICD-10-CM

## 2020-06-26 DIAGNOSIS — I729 Aneurysm of unspecified site: Secondary | ICD-10-CM | POA: Diagnosis not present

## 2020-06-26 DIAGNOSIS — E559 Vitamin D deficiency, unspecified: Secondary | ICD-10-CM | POA: Diagnosis not present

## 2020-06-26 DIAGNOSIS — D110 Benign neoplasm of parotid gland: Secondary | ICD-10-CM | POA: Diagnosis not present

## 2020-06-26 DIAGNOSIS — I10 Essential (primary) hypertension: Secondary | ICD-10-CM

## 2020-06-26 DIAGNOSIS — I700 Atherosclerosis of aorta: Secondary | ICD-10-CM

## 2020-06-26 LAB — COMPREHENSIVE METABOLIC PANEL
ALT: 14 U/L (ref 0–35)
AST: 19 U/L (ref 0–37)
Albumin: 4 g/dL (ref 3.5–5.2)
Alkaline Phosphatase: 40 U/L (ref 39–117)
BUN: 24 mg/dL — ABNORMAL HIGH (ref 6–23)
CO2: 26 mEq/L (ref 19–32)
Calcium: 9.4 mg/dL (ref 8.4–10.5)
Chloride: 107 mEq/L (ref 96–112)
Creatinine, Ser: 0.63 mg/dL (ref 0.40–1.20)
GFR: 85.04 mL/min (ref >60.00)
Glucose, Bld: 108 mg/dL — ABNORMAL HIGH (ref 70–99)
Potassium: 4.2 mEq/L (ref 3.5–5.1)
Sodium: 140 mEq/L (ref 135–145)
Total Bilirubin: 0.4 mg/dL (ref 0.2–1.2)
Total Protein: 6.8 g/dL (ref 6.0–8.3)

## 2020-06-26 LAB — MAGNESIUM: Magnesium: 1.8 mg/dL (ref 1.5–2.5)

## 2020-06-26 LAB — HEMOGLOBIN A1C: Hgb A1c MFr Bld: 6.2 % (ref 4.6–6.5)

## 2020-06-26 LAB — VITAMIN D 25 HYDROXY (VIT D DEFICIENCY, FRACTURES): VITD: 39.37 ng/mL (ref 30.00–100.00)

## 2020-06-26 NOTE — Assessment & Plan Note (Signed)
-   saw Dr. Redmond Baseman for incidental parotid finding- she found an old imaging test form 1997 and was 6 mm in 2021  and only 16 mm now- plan is for observation only. Once a year follow up planned

## 2020-07-17 DIAGNOSIS — H02054 Trichiasis without entropian left upper eyelid: Secondary | ICD-10-CM | POA: Diagnosis not present

## 2020-07-26 ENCOUNTER — Other Ambulatory Visit: Payer: Self-pay | Admitting: Family Medicine

## 2020-09-03 ENCOUNTER — Other Ambulatory Visit: Payer: Self-pay

## 2020-09-03 ENCOUNTER — Ambulatory Visit (INDEPENDENT_AMBULATORY_CARE_PROVIDER_SITE_OTHER): Payer: Medicare Other | Admitting: Family Medicine

## 2020-09-03 VITALS — BP 118/74 | HR 67 | Temp 98.3°F | Resp 15 | Ht 65.0 in | Wt 118.4 lb

## 2020-09-03 DIAGNOSIS — R52 Pain, unspecified: Secondary | ICD-10-CM | POA: Diagnosis not present

## 2020-09-03 DIAGNOSIS — R208 Other disturbances of skin sensation: Secondary | ICD-10-CM

## 2020-09-03 MED ORDER — VALACYCLOVIR HCL 1 G PO TABS: 1000.0000 mg | ORAL_TABLET | Freq: Three times a day (TID) | ORAL | 0 refills | Status: DC

## 2020-09-03 NOTE — Progress Notes (Signed)
Subjective:  Patient ID: Lori [name], female    DOB: 1940/09/29  Age: 80 y.o. MRN: [id number]  CC:  Chief Complaint  Patient presents with   Hand Pain    Pt has pain in ring finger of Rt hand pain into wrist, aching, spasm in her upper right arm later in the day no sxs today.     HPI Lori [name] presents for   R hand pain: Started yesterday. Stinging pain in R ring finger, few times. No activity at the time. Few seconds, then resolves. Similar stinging feeling in left upper arm/shoulder.felt like received a shot - lasted few seconds then resolved as well. No activity or reaching at the time.  Similar stinging pain in R distal forearm with reaching into fridge. Quickly resolves.  This am felt like it was going to do the same in R forearm, but did not progress to stinging.  No rash/blisters.  Feels well otherwise.  No insects/bugs noted.  Has not had shingles vaccine.  No recent history of carpal tunnel syndrome.  Episodic dysesthesias on outside of R wrist/hand past few weeks.  Episodic muscle cramps/spasms.  No CP/dyspnea, no neck pain or headache.     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   Aortic atherosclerosis ([place]) 02/11/2016   Hypertension 11/29/2013   Eczema 11/29/2013   Hyperlipidemia 11/29/2013   Barrett's esophagus 08/24/2013   GERD (gastroesophageal reflux disease) 05/16/2013   Vitamin D deficiency 01/29/2012   Solitary pulmonary nodule 06/08/2011   HIATAL HERNIA WITH REFLUX 02/06/2010   DEGENERATIVE JOINT DISEASE, KNEE 03/14/2008   VARICOSE VEINS LOWER EXTREMITIES W/INFLAMMATION 03/09/2007   Allergic rhinitis 03/09/2007   ACTINIC KERATOSIS, FOREHEAD, LEFT 03/09/2007   Headache(784.0) 03/09/2007   MENIERE'S DISEASE 09/22/2006   RAYNAUD'S DISEASE 09/22/2006   Osteoporosis  09/22/2006   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   Aortic atherosclerosis (HCC)    Arthritis    knee- DJD    Barrett's esophagus    Burning mouth syndrome    Dr Redmond Baseman 11-2016 - no smell or taste x 4 yrs per pt    Cataract    bilateral    Clotting disorder ([place]) 1988   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    Eczema    GERD (gastroesophageal reflux disease)    Headache(784.0)    History of IBS    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    Hyperlipidemia    on medication   Hypertension    Low back pain    Meniere disease    NEPHROLITHIASIS 12/10/2008   Neuromuscular disorder ([place])    Hiatal Hernia, Raynauds disease   Osteopenia    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[place])    Stroke ([place]) 1988   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 carotid artery dissection    Tubular adenoma of colon 02/2013   Varicose veins with inflammation    upper and lower per pt    Vasculitis [facility])    Past Surgical History:  Procedure Laterality Date   arthroscopic knee  2009   left knee/ torn meniscus   BUNIONECTOMY Right 1998   with other foot surgery    carotid artery disection  1988   Carotid Artery Dissection   CATARACT EXTRACTION, BILATERAL  07-18-2017,08-08-2017   CESAREAN SECTION  1988   1 time   COLONOSCOPY  2019   last 2019   DILATION AND CURETTAGE OF UTERUS  2004   POLYPECTOMY     POPLITEAL SYNOVIAL CYST EXCISION     left leg   TONSILLECTOMY  1957   UPPER GASTROINTESTINAL ENDOSCOPY     last 2018   Allergies  Allergen Reactions   Hydrocodone-Acetaminophen     VOMITING   Azithromycin Other (See Comments)    Thrush    Neomycin    Penicillins     REACTION: Arm swelling   Pneumococcal Vaccine Polyvalent     ARM REDNESS WITH TENDERNESS   Pneumovax [Pneumococcal Polysaccharide Vaccine]    Prior to Admission medications   Medication Sig Start Date End Date Taking? Authorizing  Provider  amLODipine (NORVASC) 2.5 MG tablet TAKE 1 TABLET BY MOUTH EVERY DAY 05/13/20  Yes [name], MD  aspirin 81 MG tablet Take 81 mg by mouth daily. Evening   Yes [provider]  fenofibrate 160 MG tablet TAKE 1 TABLET BY MOUTH EVERY DAY 03/06/20  Yes [name], MD  fluticasone [facility]) 50 MCG/ACT nasal spray Place 2 sprays into both nostrils daily. 01/17/20  Yes [name], MD  loratadine (CLARITIN) 10 MG tablet Take 10 mg by mouth daily. Take 1/2 tablet daily   Yes [provider]  meclizine (ANTIVERT) 12.5 MG tablet Take 1 tablet (12.5 mg total) by mouth 3 (three) times daily as needed (off balance). 01/17/20  Yes [name], MD  metoprolol tartrate (LOPRESSOR) 50 MG tablet TAKE 1/2 TABLET BY MOUTH 2 TIMES DAILY 07/28/20  Yes [name], MD  montelukast (SINGULAIR) 10 MG tablet TAKE 1 TABLET BY MOUTH AT BEDTIME 05/13/20  Yes [name], MD  pantoprazole (PROTONIX) 40 MG tablet TAKE 1 TABLET BY MOUTH EVERY DAY 05/29/20  Yes [name], MD  Vitamin D, Cholecalciferol, 1000 units TABS Take by mouth daily. Morning   Yes [provider]   Social History   Socioeconomic History   Marital status: Married    Spouse name: Not on file   Number of children: Not on file   Years of education: Not on file   Highest education level: Not on file  Occupational History   Occupation: retired  Tobacco Use   Smoking status: Never   Smokeless tobacco: Never  Substance and Sexual Activity   Alcohol use: No    Alcohol/week: 0.0 standard drinks   Drug use: No   Sexual activity: Yes  Other Topics Concern   Not on file  Social History Narrative   Lives with husband who is also a patient of Dr. Yong Channel   Social Determinants of Health   Financial Resource Strain: Not on file  Food Insecurity: Not on file  Transportation Needs: Not on file  Physical Activity: Not on file  Stress: Not on file  Social Connections: Not on  file  Intimate Partner Violence: Not on file    Review of Systems Per HPI.   Objective:   Vitals:   09/03/20 0959  BP: 118/74  Pulse: 67  Resp: 15  Temp: 98.3 F (36.8 C)  TempSrc: Temporal  SpO2: 96%  Weight: 118 lb 6.4 oz (53.7 kg)  Height: 5\' 5"  (1.651 m)     Physical Exam Constitutional:      General: She is not in acute  distress.    Appearance: Normal appearance. She is well-developed.  HENT:     Head: Normocephalic and atraumatic.  Cardiovascular:     Rate and Rhythm: Normal rate.  Pulmonary:     Effort: Pulmonary effort is normal.  Musculoskeletal:     Comments: C-spine range of motion does not reproduce arm/shoulder/hand symptoms.  Bilateral shoulder, elbow, wrist, hand with pain-free range of motion.  No focal tenderness.  No rash.  Negative Tinel at right Guyon's canal, and median nerve.  Upper extremity including grip strength intact.  No dysesthesias on exam at this time.  Neurological:     Mental Status: She is alert and oriented to person, place, and time.  Psychiatric:        Mood and Affect: Mood normal.       Assessment & Plan:  Destiney Sanabia is a 80 y.o. female . Dysesthesia of multiple sites - Plan: valACYclovir (VALTREX) 1000 MG tablet  Burning pain - Plan: valACYclovir (VALTREX) 1000 MG tablet  Nonspecific symptoms, primarily right-sided forearm to fourth phalanx.  Asymptomatic at present time.  Less symptoms this morning.  Could possibly be related to cervical spine but reassuring exam, and intermittent/mild symptoms.  Less likely early shingles given left shoulder symptoms but that has resolved.   -Printed prescription for Valtrex if any persistent burning, stinging pain in single dermatome/right side or certainly if any new rash.  Timing of Valtrex prescription discussed as well as potential side effects although unlikely.   -Given her current reassuring exam today and improvement in symptoms imaging and further blood work was  deferred.  If persistent episodic dysesthesias, recommended follow-up with primary care provider to decide next up.  Meds ordered this encounter  Medications   valACYclovir (VALTREX) 1000 MG tablet    Sig: Take 1 tablet (1,000 mg total) by mouth 3 (three) times daily.    Dispense:  21 tablet    Refill:  0   Patient Instructions  Your exam is reassuring today.  With symptoms on the right and left side it would be very unlikely for this to be shingles.  However if you do have persistent burning, stinging pain or certainly any rash on the right side, start the valacyclovir as we discussed.    Gentle range of motion of the neck throughout the day is okay as well as other joints but if you do have continued intermittent burning or stinging symptoms in various areas of the body, I would recommend following up with your primary care provider to discuss next steps in evaluation.  Thank you for coming in today.  Return to the clinic or go to the nearest emergency room if any of your symptoms worsen or new symptoms occur.    Signed,   Merri Ray, MD [place] 09/03/20 11:05 AM

## 2020-09-03 NOTE — Patient Instructions (Signed)
Your exam is reassuring today.  With symptoms on the right and left side it would be very unlikely for this to be shingles.  However if you do have persistent burning, stinging pain or certainly any rash on the right side, start the valacyclovir as we discussed.    Gentle range of motion of the neck throughout the day is okay as well as other joints but if you do have continued intermittent burning or stinging symptoms in various areas of the body, I would recommend following up with your primary care provider to discuss next steps in evaluation.  Thank you for coming in today.  Return to the clinic or go to the nearest emergency room if any of your symptoms worsen or new symptoms occur.

## 2020-09-08 ENCOUNTER — Encounter: Payer: Self-pay | Admitting: Family Medicine

## 2020-09-08 ENCOUNTER — Other Ambulatory Visit: Payer: Self-pay

## 2020-09-08 ENCOUNTER — Ambulatory Visit (INDEPENDENT_AMBULATORY_CARE_PROVIDER_SITE_OTHER): Payer: Medicare Other | Admitting: Family Medicine

## 2020-09-08 VITALS — BP 134/76 | HR 83 | Temp 98.2°F | Ht 65.0 in | Wt 118.4 lb

## 2020-09-08 DIAGNOSIS — R52 Pain, unspecified: Secondary | ICD-10-CM

## 2020-09-08 DIAGNOSIS — I10 Essential (primary) hypertension: Secondary | ICD-10-CM | POA: Diagnosis not present

## 2020-09-08 NOTE — Patient Instructions (Addendum)
If new or worsening symptoms let us know- could consider sports medicine referral as long as no rash (doubt shingles though)  You are eligible to schedule your annual wellness visit with our nurse specialist Otila Kluver.  Please consider scheduling this before you leave today  Recommended follow up: No follow-ups on file.

## 2020-09-08 NOTE — Progress Notes (Signed)
Phone [phone] In person visit   Subjective:   Lori [name] is a 80 y.o. year old very pleasant female patient who presents for/with See problem oriented charting Chief Complaint  Patient presents with   Burning    Burning and stinging sensation in her right hand and above her wrist (3-4 episodes ) then it went to her left arm between her shoulder and elbow.     This visit occurred during the SARS-CoV-2 public health emergency.  Safety protocols were in place, including screening questions prior to the visit, additional usage of staff PPE, and extensive cleaning of exam room while observing appropriate contact time as indicated for disinfecting solutions.   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    Priority: Medium   Hypertension 11/29/2013    Priority: Medium   Hyperlipidemia 11/29/2013    Priority: Medium   Barrett's esophagus 08/24/2013    Priority: Medium   Allergic rhinitis 03/09/2007    Priority: Medium   Osteoporosis 09/22/2006    Priority: Medium   IBS (irritable bowel syndrome) 06/26/2019    Priority: Low   Aortic atherosclerosis ([place]) 02/11/2016    Priority: Low   Eczema 11/29/2013    Priority: Low   GERD (gastroesophageal reflux disease) 05/16/2013    Priority: Low   Vitamin D deficiency 01/29/2012    Priority: Low   Solitary pulmonary nodule 06/08/2011    Priority: Low   HIATAL HERNIA WITH REFLUX 02/06/2010    Priority: Low   DEGENERATIVE JOINT DISEASE, KNEE 03/14/2008    Priority: Low   VARICOSE VEINS LOWER EXTREMITIES W/INFLAMMATION 03/09/2007    Priority: Low   ACTINIC KERATOSIS, FOREHEAD, LEFT 03/09/2007    Priority: Low   Headache(784.0) 03/09/2007    Priority: Low   MENIERE'S DISEASE 09/22/2006    Priority: Low   RAYNAUD'S DISEASE 09/22/2006    Priority:  Low    Medications- reviewed and updated Current Outpatient Medications  Medication Sig Dispense Refill   amLODipine (NORVASC) 2.5 MG tablet TAKE 1 TABLET BY MOUTH EVERY DAY 90 tablet 2   aspirin 81 MG tablet Take 81 mg by mouth daily. Evening     fenofibrate 160 MG tablet TAKE 1 TABLET BY MOUTH EVERY DAY 90 tablet 1   fluticasone (FLONASE) 50 MCG/ACT nasal spray Place 2 sprays into both nostrils daily. 16 g 0   loratadine (CLARITIN) 10 MG tablet Take 10 mg by mouth daily. Take 1/2 tablet daily     meclizine (ANTIVERT) 12.5 MG tablet Take 1 tablet (12.5 mg total) by mouth 3 (three) times daily as needed (off balance). 30 tablet 0   metoprolol tartrate (LOPRESSOR) 50 MG tablet TAKE 1/2 TABLET BY MOUTH 2 TIMES DAILY 90 tablet 1   montelukast (SINGULAIR) 10 MG tablet TAKE 1 TABLET BY MOUTH AT BEDTIME 90 tablet 1   pantoprazole (PROTONIX) 40 MG tablet TAKE 1 TABLET BY MOUTH EVERY DAY 90 tablet 1   valACYclovir (VALTREX) 1000 MG tablet Take 1 tablet (1,000 mg total) by mouth 3 (three) times daily. 21 tablet 0   Vitamin D, Cholecalciferol, 1000 units TABS Take by mouth daily. Morning     No current facility-administered medications for this visit.     Objective:  BP 134/76   Pulse 83   Temp 98.2 F (36.8 C) (Temporal)  Ht 5\' 5"  (1.651 m)   Wt 118 lb 6.4 oz (53.7 kg)   SpO2 97%   BMI 19.70 kg/m  Gen: NAD, resting comfortably CV: RRR no murmurs rubs or gallops Lungs: CTAB no crackles, wheeze, rhonchi Ext: no edema Skin: warm, dry, no rash on the right hand or left shoulder Msk: ulnar gutter palpation/tinel shows recurent tingling in left hand (was not experiencing prior to this)    Assessment and Plan   # Burning pain in 3 sites last Tuesday- mild recurrence right forearm friday S: Patient presented to Dr. Carlota Raspberry 5 days ago of  primary care with symptoms starting a day prior-patient with nonspecific symptoms including right-sided forearm (putting hand into fridge and would  recur)OR fourth phalanx stinging pain for a few seconds then resolution with no symptoms at time of visit.  Was thought potentially related to cervical spine but exam was reassuring.  Was thought possible early shingles but on the other hand with left shoulder symptoms x1 thought less likely. No shingles vaccination.   Plan was for Valtrex if persistent burning/stinging pain and timing discussed  Since last visit has not had many symptoms (other than mild tingling) other than some forearm pain very mild on Friday. Today reports had a similar stinging in the left shoulder last Tuesday without recurrence. She has not taken valtrex  States reflux interestingly enough was worse off claritin but is restarting. Also wonders if could be caused by sleep position- propping up more from reflux. Has had tingling in 4th finger at times over last year.  A/P: unclear etiology of symptoms last week- may have been sleep position related/nerve irritation. Much improved this week. Some mild intermittent tingling in right 4th finger  and into palm but has has intermittently (could have some unlar nerve irritation)- may have some ulnar nerve irritation. Doubt signiicant cervical issue- will have her follow up if new or worsening symptoms -left handed but ambidextrous   For her allergies- restart   #hypertension S: medication: amlodipine 2.5mg , metoprolol 25 mg BID.   Home readings #s: no recent checks BP Readings from Last 3 Encounters:  09/08/20 134/76  09/03/20 118/74  06/26/20 130/70  A/P: well controlled on repeat- continue current meds   Recommended follow up: keep October visit Future Appointments  Date Time Provider [place]  01/02/2021 10:40 AM [name], MD [facility]    Lab/Order associations:   ICD-10-CM   1. Burning pain  R52     2. Primary hypertension  I10       Return precautions advised.  Garret Reddish, MD

## 2020-09-16 DIAGNOSIS — Z1231 Encounter for screening mammogram for malignant neoplasm of breast: Secondary | ICD-10-CM | POA: Diagnosis not present

## 2020-09-16 LAB — HM MAMMOGRAPHY

## 2020-09-18 ENCOUNTER — Encounter: Payer: Self-pay | Admitting: Family Medicine

## 2020-09-18 ENCOUNTER — Ambulatory Visit: Payer: Medicare Other

## 2020-09-19 ENCOUNTER — Ambulatory Visit: Payer: Medicare Other

## 2020-09-25 ENCOUNTER — Ambulatory Visit (INDEPENDENT_AMBULATORY_CARE_PROVIDER_SITE_OTHER): Payer: Medicare Other

## 2020-09-25 ENCOUNTER — Other Ambulatory Visit: Payer: Self-pay

## 2020-09-25 VITALS — BP 122/70 | HR 76 | Temp 97.8°F | Wt 119.8 lb

## 2020-09-25 DIAGNOSIS — Z0000 Encounter for general adult medical examination without abnormal findings: Secondary | ICD-10-CM

## 2020-09-25 NOTE — Progress Notes (Signed)
Subjective:   Ailyne Pawley is a 80 y.o. female who presents for Medicare Annual (Subsequent) preventive examination.  Review of Systems     Cardiac Risk Factors include: advanced age (>48mn, >>80women);hypertension;dyslipidemia     Objective:    Today's Vitals   09/25/20 1424 09/25/20 1425  BP:  122/70  Pulse:  76  Temp:  97.8 F (36.6 C)  SpO2:  97%  Weight:  119 lb 12.8 oz (54.3 kg)  PainSc: 6     Body mass index is 19.94 kg/m.  Advanced Directives 09/25/2020 01/18/2020 12/01/2018 11/17/2017 10/22/2016 09/24/2016  Does Patient Have a Medical Advance Directive? Yes No Yes Yes Yes Yes  Type of Advance Directive Living will - Living will;Healthcare Power of Attorney - - Living will;Healthcare Power of Attorney  Does patient want to make changes to medical advance directive? - - No - Patient declined - - -  Copy of HAlexin Chart? - - No - copy requested - - -    Current Medications (verified) Outpatient Encounter Medications as of 09/25/2020  Medication Sig   amLODipine (NORVASC) 2.5 MG tablet TAKE 1 TABLET BY MOUTH EVERY DAY   aspirin 81 MG tablet Take 81 mg by mouth daily. Evening   fenofibrate 160 MG tablet TAKE 1 TABLET BY MOUTH EVERY DAY   loratadine (CLARITIN) 10 MG tablet Take 10 mg by mouth daily. Take 1/2 tablet daily   metoprolol tartrate (LOPRESSOR) 50 MG tablet TAKE 1/2 TABLET BY MOUTH 2 TIMES DAILY   montelukast (SINGULAIR) 10 MG tablet TAKE 1 TABLET BY MOUTH AT BEDTIME   pantoprazole (PROTONIX) 40 MG tablet TAKE 1 TABLET BY MOUTH EVERY DAY   Vitamin D, Cholecalciferol, 1000 units TABS Take by mouth daily. Morning   meclizine (ANTIVERT) 12.5 MG tablet Take 1 tablet (12.5 mg total) by mouth 3 (three) times daily as needed (off balance). (Patient not taking: Reported on 09/25/2020)   valACYclovir (VALTREX) 1000 MG tablet Take 1 tablet (1,000 mg total) by mouth 3 (three) times daily. (Patient not taking: Reported on 09/25/2020)   [DISCONTINUED]  fluticasone (FLONASE) 50 MCG/ACT nasal spray Place 2 sprays into both nostrils daily. (Patient not taking: Reported on 09/25/2020)   No facility-administered encounter medications on file as of 09/25/2020.    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    Eczema    GERD (gastroesophageal reflux disease)    Headache(784.0)    History of IBS    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    Hyperlipidemia    on medication   Hypertension    Low back pain    Meniere disease    NEPHROLITHIASIS 12/10/2008   Neuromuscular disorder ([facility]    Hiatal Hernia, Raynauds disease   Osteopenia    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 [place]    Stroke [place]   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    Tubular adenoma of colon 02/2013   Varicose veins with inflammation    upper and lower per pt    Vasculitis [facility])    Past Surgical History:  Procedure Laterality Date   arthroscopic knee  2009   left knee/ torn meniscus   BUNIONECTOMY Right 1998   with other foot surgery    carotid artery disection  1988   Carotid Artery Dissection   CATARACT EXTRACTION, BILATERAL  07-18-2017,08-08-2017   CESAREAN SECTION  1988   1 time   COLONOSCOPY  2019   last 2019   [place] OF UTERUS  2004   POLYPECTOMY     POPLITEAL SYNOVIAL CYST EXCISION     left leg   TONSILLECTOMY  1957   UPPER GASTROINTESTINAL ENDOSCOPY     last 2018   Family History   Problem Relation Age of Onset   Multiple myeloma Father    Kidney disease Brother        cancer- removed   Prostate cancer Brother    Alzheimer's disease Brother    Stroke Brother    Thyroid cancer Sister        s/p removal. papilary and anaplastic.    Lung cancer Sister    Alzheimer's disease Brother        older broterh   Stroke Brother        88-older brother   Colon cancer Neg Hx    Esophageal cancer Neg Hx    Rectal cancer Neg Hx    Stomach cancer Neg Hx    Colon polyps Neg Hx    Social History   Socioeconomic History   Marital status: Married    Spouse name: Not on file   Number of children: Not on file   Years of education: Not on file   Highest education level: Not on file  Occupational History   Occupation: retired  Tobacco Use   Smoking status: Never   Smokeless tobacco: Never  Substance and Sexual Activity   Alcohol use: No    Alcohol/week: 0.0 standard drinks   Drug use: No   Sexual activity: Yes  Other Topics Concern   Not on file  Social History Narrative   Lives with husband who is also a patient of Dr. Yong Channel   Social Determinants of Health   Financial Resource Strain: Low Risk    Difficulty of Paying Living Expenses: Not hard at all  Food Insecurity: No Food Insecurity   Worried About Charity fundraiser in the Last Year: Never true   Cumings in the Last Year: Never true  Transportation Needs: No Transportation Needs   Lack of Transportation (Medical): No   Lack of Transportation (Non-Medical): No  Physical Activity: Sufficiently Active   Days of Exercise per Week: 6 days   Minutes of Exercise per Session: 50 min  Stress: No Stress Concern Present   Feeling of Stress : Not at all  Social Connections: Socially Integrated   Frequency of Communication with Friends and Family: More than three times a week   Frequency of Social Gatherings with Friends and Family: More than three times a week   Attends Religious Services: More than 4  times per year   Active Member of [employer] or Organizations: Yes   Attends Archivist Meetings: 1 to 4 times per year   Marital Status: Married    Tobacco Counseling Counseling given: Not Answered  Clinical Intake:  Pre-visit preparation completed: Yes  Pain : 0-10 Pain Score: 6  Pain Type: Chronic pain Pain Location: Back Pain Orientation: Left, Upper Pain Descriptors / Indicators: Spasm Pain Onset: More than a month ago Pain Frequency: Intermittent     BMI - recorded: 19.94 Nutritional Status: BMI of 19-24  Normal Nutritional Risks: None Diabetes: No  How often do you need to have someone help you when you read instructions, pamphlets, or other written materials from your doctor or pharmacy?: 1 - Never  Diabetic?No  Interpreter Needed?: No  Information entered by :: Charlott Rakes, LPN   Activities of Daily Living In your present state of health, do you have any difficulty performing the following activities: 09/25/2020 12/27/2019  Hearing? N N  Vision? N N  Difficulty concentrating or making decisions? N N  Walking or climbing stairs? N N  Dressing or bathing? N N  Doing errands, shopping? N N  Preparing Food and eating ? N -  Using the Toilet? N -  In the past six months, have you accidently leaked urine? N -  Do you have problems with loss of bowel control? N -  Managing your Medications? N -  Managing your Finances? N -  Housekeeping or managing your Housekeeping? N -  Some recent data might be hidden    Patient Care Team: [name], MD as PCP - General (Family Medicine) Melida Quitter, MD as Consulting Physician (Otolaryngology) Ladene Artist, MD as Consulting Physician (Gastroenterology) Harriett Sine, MD as Consulting Physician (Dermatology)  Indicate any recent Medical Services you may have received from other than [facility] providers in the past year (date may be approximate).     Assessment:   This is a routine wellness  examination for Laketha.  Hearing/Vision screen Hearing Screening - Comments:: Pt denies any hearing issues  Vision Screening - Comments:: Pt follows up with Dr Phineas Douglas for annual eye exams   Dietary issues and exercise activities discussed: Current Exercise Habits: Home exercise routine, Type of exercise: walking, Time (Minutes): 45, Frequency (Times/Week): 6, Weekly Exercise (Minutes/Week): 270   Goals Addressed             This Visit's Progress    continue exercising         Depression Screen PHQ 2/9 Scores 09/25/2020 09/03/2020 12/27/2019 12/01/2018 04/25/2018 11/17/2017 12/22/2016  PHQ - 2 Score 0 0 0 0 0 0 0  PHQ- 9 Score - 0 - - 0 - -    Fall Risk Fall Risk  09/25/2020 09/03/2020 12/27/2019 12/26/2018 12/01/2018  Falls in the past year? 0 0 0 0 0  Number falls in past yr: 0 - 0 0 0  Injury with Fall? 0 - 0 0 0  Risk for fall due to : Impaired vision;Impaired balance/gait - - - -  Risk for fall due to: Comment vertigo at times - - - -  Follow up Falls prevention discussed Falls evaluation completed - - Falls evaluation completed;Education provided    FALL RISK PREVENTION PERTAINING TO THE HOME:  Any stairs in or around the home? No  If so, are there any without handrails? No  Home free of loose throw rugs in walkways, pet beds, electrical cords, etc? Yes  Adequate lighting in your home to reduce risk of falls? Yes   ASSISTIVE DEVICES UTILIZED TO PREVENT FALLS:  Life alert? No  Use of a cane, walker or w/c? No  Grab bars in the bathroom? Yes  Shower chair or  bench in shower? Yes  Elevated toilet seat or a handicapped toilet? Yes   TIMED UP AND GO:  Was the test performed? Yes .  Length of time to ambulate 10 feet: 10 sec.   Gait steady and fast without use of assistive device  Cognitive Function:     6CIT Screen 09/25/2020 12/01/2018  What Year? 0 points 0 points  What month? 0 points 0 points  What time? 0 points 0 points  Count back from 20 0 points 0 points  Months  in reverse 0 points 0 points  Repeat phrase 2 points 0 points  Total Score 2 0    Immunizations Immunization History  Administered Date(s) Administered   Fluad Quad(high Dose 65+) 12/01/2018, 12/27/2019   Influenza Split 01/04/2012   Influenza Whole 01/03/2007, 12/08/2007, 12/17/2008, 01/20/2010   Influenza, High Dose Seasonal PF 01/10/2015, 12/12/2015, 12/29/2016, 12/23/2017   Influenza,inj,Quad PF,6+ Mos 01/05/2013, 12/24/2013   PFIZER(Purple Top)SARS-COV-2 Vaccination 04/30/2019, 05/23/2019, 02/05/2020   Pneumococcal Polysaccharide-23 02/12/2004   Td 03/23/2003, 04/04/2013    TDAP status: Up to date  Flu Vaccine status: Up to date  Pneumococcal vaccine status: Due, Education has been provided regarding the importance of this vaccine. Advised may receive this vaccine at local pharmacy or Health Dept. Aware to provide a copy of the vaccination record if obtained from local pharmacy or Health Dept. Verbalized acceptance and understanding.  Covid-19 vaccine status: Completed vaccines  Qualifies for Shingles Vaccine? Yes   Zostavax completed No   Shingrix Completed?: No.    Education has been provided regarding the importance of this vaccine. Patient has been advised to call insurance company to determine out of pocket expense if they have not yet received this vaccine. Advised may also receive vaccine at local pharmacy or Health Dept. Verbalized acceptance and understanding.  Screening Tests Health Maintenance  Topic Date Due   COVID-19 Vaccine (4 - Booster for Pfizer series) 05/07/2020   Zoster Vaccines- Shingrix (1 of 2) 12/04/2020 (Originally 03/10/1960)   INFLUENZA VACCINE  10/20/2020   MAMMOGRAM  09/16/2021   TETANUS/TDAP  04/05/2023   DEXA SCAN  Completed   Hepatitis C Screening  Completed   HPV VACCINES  Aged Out   PNA vac Low Risk Adult  Discontinued    Health Maintenance  Health Maintenance Due  Topic Date Due   COVID-19 Vaccine (4 - Booster for Pfizer series)  05/07/2020    Colorectal cancer screening: No longer required.   Mammogram status: Completed 09/16/20. Repeat every year  Bone Density status: Completed 01/02/19. Results reflect: Bone density results: OSTEOPOROSIS. Repeat every 2 years.   Additional Screening:  Hepatitis C Screening:  Completed 12/27/19  Vision Screening: Recommended annual ophthalmology exams for early detection of glaucoma and other disorders of the eye. Is the patient up to date with their annual eye exam?  Yes  Who is the provider or what is the name of the office in which the patient attends annual eye exams? Dr Christain [place]  If pt is not established with a provider, would they like to be referred to a provider to establish care? No .   Dental Screening: Recommended annual dental exams for proper oral hygiene  Community Resource Referral / Chronic Care Management: CRR required this visit?  No   CCM required this visit?  No      Plan:     I have personally reviewed and noted the following in the patient's chart:   Medical and social history Use of alcohol, tobacco  or illicit drugs  Current medications and supplements including opioid prescriptions.  Functional ability and status Nutritional status Physical activity Advanced directives List of other physicians Hospitalizations, surgeries, and ER visits in previous 12 months Vitals Screenings to include cognitive, depression, and falls Referrals and appointments  In addition, I have reviewed and discussed with patient certain preventive protocols, quality metrics, and best practice recommendations. A written personalized care plan for preventive services as well as general preventive health recommendations were provided to patient.     Willette Brace, LPN   10/22/2344   Nurse Notes: None

## 2020-09-25 NOTE — Patient Instructions (Addendum)
Lori [name] , Thank you for taking time to come for your Medicare Wellness Visit. I appreciate your ongoing commitment to your health goals. Please review the following plan we discussed and let me know if I can assist you in the future.   Screening recommendations/referrals: Colonoscopy: No longer required  Mammogram: Done 09/16/20 repeat every year Bone Density: Done 01/02/19 repeat per provider  Recommended yearly ophthalmology/optometry visit for glaucoma screening and checkup Recommended yearly dental visit for hygiene and checkup  Vaccinations: Influenza vaccine: Due 10/20/20 Pneumococcal vaccine: Allergic Tdap vaccine: done 04/04/13 due in 10 years 04/05/23 Shingles vaccine: Shingrix discussed. Please contact your pharmacy for coverage information.    Covid-19:Completed 2/8, 3/3, & 02/05/20  Advanced directives: Please bring a copy of your health care power of attorney and living will to the office at your convenience.  Conditions/risks identified: continue to exercise   Next appointment: Follow up in one year for your annual wellness visit    Preventive Care 65 Years and Older, Female Preventive care refers to lifestyle choices and visits with your health care provider that can promote health and wellness. What does preventive care include? A yearly physical exam. This is also called an annual well check. Dental exams once or twice a year. Routine eye exams. Ask your health care provider how often you should have your eyes checked. Personal lifestyle choices, including: Daily care of your teeth and gums. Regular physical activity. Eating a healthy diet. Avoiding tobacco and drug use. Limiting alcohol use. Practicing safe sex. Taking low-dose aspirin every day. Taking vitamin and mineral supplements as recommended by your health care provider. What happens during an annual well check? The services and screenings done by your health care provider during your annual well check  will depend on your age, overall health, lifestyle risk factors, and family history of disease. Counseling  Your health care provider may ask you questions about your: Alcohol use. Tobacco use. Drug use. Emotional well-being. Home and relationship well-being. Sexual activity. Eating habits. History of falls. Memory and ability to understand (cognition). Work and work Statistician. Reproductive health. Screening  You may have the following tests or measurements: Height, weight, and BMI. Blood pressure. Lipid and cholesterol levels. These may be checked every 5 years, or more frequently if you are over 79 years old. Skin check. Lung cancer screening. You may have this screening every year starting at age 52 if you have a 30-pack-year history of smoking and currently smoke or have quit within the past 15 years. Fecal occult blood test (FOBT) of the stool. You may have this test every year starting at age 41. Flexible sigmoidoscopy or colonoscopy. You may have a sigmoidoscopy every 5 years or a colonoscopy every 10 years starting at age 75. Hepatitis C blood test. Hepatitis B blood test. Sexually transmitted disease (STD) testing. Diabetes screening. This is done by checking your blood sugar (glucose) after you have not eaten for a while (fasting). You may have this done every 1-3 years. Bone density scan. This is done to screen for osteoporosis. You may have this done starting at age 13. Mammogram. This may be done every 1-2 years. Talk to your health care provider about how often you should have regular mammograms. Talk with your health care provider about your test results, treatment options, and if necessary, the need for more tests. Vaccines  Your health care provider may recommend certain vaccines, such as: Influenza vaccine. This is recommended every year. Tetanus, diphtheria, and acellular pertussis (Tdap, Td)  vaccine. You may need a Td booster every 10 years. Zoster vaccine. You  may need this after age 73. Pneumococcal 13-valent conjugate (PCV13) vaccine. One dose is recommended after age 79. Pneumococcal polysaccharide (PPSV23) vaccine. One dose is recommended after age 67. Talk to your health care provider about which screenings and vaccines you need and how often you need them. This information is not intended to replace advice given to you by your health care provider. Make sure you discuss any questions you have with your health care provider. Document Released: 04/04/2015 Document Revised: 11/26/2015 Document Reviewed: 01/07/2015 Elsevier Interactive Patient Education  2017 [place] Prevention in the Home Falls can cause injuries. They can happen to people of all ages. There are many things you can do to make your home safe and to help prevent falls. What can I do on the outside of my home? Regularly fix the edges of walkways and driveways and fix any cracks. Remove anything that might make you trip as you walk through a door, such as a raised step or threshold. Trim any bushes or trees on the path to your home. Use bright outdoor lighting. Clear any walking paths of anything that might make someone trip, such as rocks or tools. Regularly check to see if handrails are loose or broken. Make sure that both sides of any steps have handrails. Any raised decks and porches should have guardrails on the edges. Have any leaves, snow, or ice cleared regularly. Use sand or salt on walking paths during winter. Clean up any spills in your garage right away. This includes oil or grease spills. What can I do in the bathroom? Use night lights. Install grab bars by the toilet and in the tub and shower. Do not use towel bars as grab bars. Use non-skid mats or decals in the tub or shower. If you need to sit down in the shower, use a plastic, non-slip stool. Keep the floor dry. Clean up any water that spills on the floor as soon as it happens. Remove soap buildup  in the tub or shower regularly. Attach bath mats securely with double-sided non-slip rug tape. Do not have throw rugs and other things on the floor that can make you trip. What can I do in the bedroom? Use night lights. Make sure that you have a light by your bed that is easy to reach. Do not use any sheets or blankets that are too big for your bed. They should not hang down onto the floor. Have a firm chair that has side arms. You can use this for support while you get dressed. Do not have throw rugs and other things on the floor that can make you trip. What can I do in the kitchen? Clean up any spills right away. Avoid walking on wet floors. Keep items that you use a lot in easy-to-reach places. If you need to reach something above you, use a strong step stool that has a grab bar. Keep electrical cords out of the way. Do not use floor polish or wax that makes floors slippery. If you must use wax, use non-skid floor wax. Do not have throw rugs and other things on the floor that can make you trip. What can I do with my stairs? Do not leave any items on the stairs. Make sure that there are handrails on both sides of the stairs and use them. Fix handrails that are broken or loose. Make sure that handrails are as long  as the stairways. Check any carpeting to make sure that it is firmly attached to the stairs. Fix any carpet that is loose or worn. Avoid having throw rugs at the top or bottom of the stairs. If you do have throw rugs, attach them to the floor with carpet tape. Make sure that you have a light switch at the top of the stairs and the bottom of the stairs. If you do not have them, ask someone to add them for you. What else can I do to help prevent falls? Wear shoes that: Do not have high heels. Have rubber bottoms. Are comfortable and fit you well. Are closed at the toe. Do not wear sandals. If you use a stepladder: Make sure that it is fully opened. Do not climb a closed  stepladder. Make sure that both sides of the stepladder are locked into place. Ask someone to hold it for you, if possible. Clearly mark and make sure that you can see: Any grab bars or handrails. First and last steps. Where the edge of each step is. Use tools that help you move around (mobility aids) if they are needed. These include: Canes. Walkers. Scooters. Crutches. Turn on the lights when you go into a dark area. Replace any light bulbs as soon as they burn out. Set up your furniture so you have a clear path. Avoid moving your furniture around. If any of your floors are uneven, fix them. If there are any pets around you, be aware of where they are. Review your medicines with your doctor. Some medicines can make you feel dizzy. This can increase your chance of falling. Ask your doctor what other things that you can do to help prevent falls. This information is not intended to replace advice given to you by your health care provider. Make sure you discuss any questions you have with your health care provider. Document Released: 01/02/2009 Document Revised: 08/14/2015 Document Reviewed: 04/12/2014 Elsevier Interactive Patient Education  2017 [employer].

## 2020-10-02 ENCOUNTER — Encounter: Payer: Self-pay | Admitting: Family Medicine

## 2020-10-02 DIAGNOSIS — R928 Other abnormal and inconclusive findings on diagnostic imaging of breast: Secondary | ICD-10-CM | POA: Diagnosis not present

## 2020-10-02 LAB — HM MAMMOGRAPHY

## 2020-10-03 ENCOUNTER — Encounter: Payer: Self-pay | Admitting: Family Medicine

## 2020-10-06 ENCOUNTER — Encounter: Payer: Self-pay | Admitting: Family Medicine

## 2020-10-13 ENCOUNTER — Other Ambulatory Visit: Payer: Self-pay | Admitting: Radiology

## 2020-10-13 DIAGNOSIS — Z170 Estrogen receptor positive status [ER+]: Secondary | ICD-10-CM | POA: Diagnosis not present

## 2020-10-13 DIAGNOSIS — C50311 Malignant neoplasm of lower-inner quadrant of right female breast: Secondary | ICD-10-CM | POA: Diagnosis not present

## 2020-10-15 ENCOUNTER — Encounter: Payer: Self-pay | Admitting: Family Medicine

## 2020-10-16 ENCOUNTER — Encounter: Payer: Self-pay | Admitting: Family Medicine

## 2020-10-16 ENCOUNTER — Telehealth: Payer: Self-pay | Admitting: Hematology and Oncology

## 2020-10-16 NOTE — Telephone Encounter (Signed)
Left message for patient to return my call in reference to upcoming [facility] appointment for 8/3 [phone]'$ 

## 2020-10-17 ENCOUNTER — Encounter: Payer: Self-pay | Admitting: *Deleted

## 2020-10-17 DIAGNOSIS — Z170 Estrogen receptor positive status [ER+]: Secondary | ICD-10-CM | POA: Insufficient documentation

## 2020-10-21 NOTE — Progress Notes (Signed)
[facility] NOTE  Patient Care Team: [name], MD as PCP - General (Family Medicine) Melida Quitter, MD as Consulting Physician (Otolaryngology) Ladene Artist, MD as Consulting Physician (Gastroenterology) Harriett Sine, MD as Consulting Physician (Dermatology) Mauro Kaufmann, RN as Oncology Nurse Navigator Rockwell Germany, RN as Oncology Nurse Navigator Erroll Luna, MD as Consulting Physician (General Surgery) Nicholas Lose, MD as Consulting Physician (Hematology and Oncology) Kyung Rudd, MD as Consulting Physician (Radiation Oncology)  CHIEF COMPLAINTS/PURPOSE OF CONSULTATION:  Newly diagnosed right breast cancer  HISTORY OF PRESENTING ILLNESS:  Lori [name] 80 y.o. female is here because of recent diagnosis of invasive ductal carcinoma of the right breast. Screening mammogram on 09/16/20 showed indeterminate mass in the right breast. Diagnostic mammogram and [place] on 09/16/20 showed 1 cm x 1.3 cm suspicious irregular mass at 4:00 4 cm from the nipple. 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 She presents to the clinic today for initial evaluation and discussion of treatment options.   I reviewed her records extensively and collaborated the history with the patient.  SUMMARY OF ONCOLOGIC HISTORY: Oncology History  Malignant neoplasm of lower-inner quadrant of right breast of female, estrogen receptor positive ([place])  10/13/2020 Initial Diagnosis   Screening mammogram showed indeterminate mass in the right breast. Diagnostic mammogram and US showed 1 cm x 1.3 cm suspicious irregular mass at 4:00 4 cm from the nipple. 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    Hyperlipidemia    on medication   Hypertension    Low back pain    Meniere disease    NEPHROLITHIASIS 12/10/2008   Neuromuscular disorder ([place])    Hiatal Hernia, Raynauds disease   Osteopenia    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[place])    Stroke ([place]) 1988   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 carotid artery dissection    Tubular adenoma of colon 02/2013   Varicose veins with inflammation    upper and lower per pt    Vasculitis ([place])     SURGICAL HISTORY: Past Surgical History:  Procedure Laterality Date   arthroscopic knee  2009   left knee/ torn meniscus   BUNIONECTOMY Right 1998   with other foot surgery    carotid artery disection  1988   Carotid Artery Dissection   CATARACT EXTRACTION, BILATERAL  07-18-2017,08-08-2017   CESAREAN SECTION  1988   1 time   COLONOSCOPY  2019   last 2019   [place] OF UTERUS  2004   POLYPECTOMY     POPLITEAL SYNOVIAL CYST EXCISION     left leg   TONSILLECTOMY  1957   UPPER  GASTROINTESTINAL ENDOSCOPY     last 2018    SOCIAL HISTORY: Social History   Socioeconomic History   Marital status: Married    Spouse name: Not on file   Number of children: Not on file   Years of education: Not on file   Highest education level: Not on file  Occupational History   Occupation: retired  Tobacco Use   Smoking status: Never   Smokeless tobacco: Never  Substance and Sexual Activity   Alcohol use: No    Alcohol/week: 0.0 standard drinks   Drug use: No   Sexual activity: Yes  Other Topics Concern   Not on file  Social History Narrative   Lives with  husband who is also a patient of Dr. Yong Channel   Social Determinants of Health   Financial Resource Strain: Low Risk    Difficulty of Paying Living Expenses: Not hard at all  Food Insecurity: No Food Insecurity   Worried About Charity fundraiser in the Last Year: Never true   [place] in the Last Year: Never true  Transportation Needs: No Transportation Needs   Lack of Transportation (Medical): No   Lack of Transportation (Non-Medical): No  Physical Activity: Sufficiently Active   Days of Exercise per Week: 6 days   Minutes of Exercise per Session: 50 min  Stress: No Stress Concern Present   Feeling of Stress : Not at all  Social Connections: Socially Integrated   Frequency of Communication with Friends and Family: More than three times a week   Frequency of Social Gatherings with Friends and Family: More than three times a week   Attends Religious Services: More than 4 times per year   Active Member of [employer] or Organizations: Yes   Attends Archivist Meetings: 1 to 4 times per year   Marital Status: Married  Human resources officer Violence: Not At Risk   Fear of Current or Ex-Partner: No   Emotionally Abused: No   Physically Abused: No   Sexually Abused: No    FAMILY HISTORY: Family History  Problem Relation Age of Onset   Multiple myeloma Father    Thyroid cancer Sister        s/p removal. papilary and anaplastic.    Lung cancer Sister    Kidney disease Brother        cancer- removed   Prostate cancer Brother    Alzheimer's disease Brother    Stroke Brother    Alzheimer's disease Brother        older broterh   Stroke Brother        88-older brother   Breast cancer Cousin    Colon cancer Neg Hx    Esophageal cancer Neg Hx    Rectal cancer Neg Hx    Stomach cancer Neg Hx    Colon polyps Neg Hx     ALLERGIES:  is allergic to hydrocodone-acetaminophen, azithromycin, neomycin, penicillins, pneumococcal vaccine polyvalent, and pneumovax [pneumococcal  polysaccharide vaccine].  MEDICATIONS:  Current Outpatient Medications  Medication Sig Dispense Refill   amLODipine (NORVASC) 2.5 MG tablet TAKE 1 TABLET BY MOUTH EVERY DAY 90 tablet 2   aspirin 81 MG tablet Take 81 mg by mouth daily. Evening     fenofibrate 160 MG tablet TAKE 1 TABLET BY MOUTH EVERY DAY 90 tablet 1   loratadine (CLARITIN) 10 MG tablet Take 10 mg by mouth daily. Take 1/2 tablet daily     metoprolol tartrate (LOPRESSOR) 50 MG tablet TAKE 1/2  TABLET BY MOUTH 2 TIMES DAILY 90 tablet 1   montelukast (SINGULAIR) 10 MG tablet TAKE 1 TABLET BY MOUTH AT BEDTIME 90 tablet 1   pantoprazole (PROTONIX) 40 MG tablet TAKE 1 TABLET BY MOUTH EVERY DAY 90 tablet 1   Vitamin D, Cholecalciferol, 1000 units TABS Take by mouth daily. Morning     meclizine (ANTIVERT) 12.5 MG tablet Take 1 tablet (12.5 mg total) by mouth 3 (three) times daily as needed (off balance). (Patient not taking: No sig reported) 30 tablet 0   No current facility-administered medications for this visit.    REVIEW OF SYSTEMS:   Constitutional: Denies fevers, chills or abnormal night sweats Eyes: Denies blurriness of vision, double vision or watery eyes Ears, nose, mouth, throat, and face: Denies mucositis or sore throat Respiratory: Denies cough, dyspnea or wheezes Cardiovascular: Denies palpitation, chest discomfort or lower extremity swelling Gastrointestinal:  Denies nausea, heartburn or change in bowel habits Skin: Denies abnormal skin rashes Lymphatics: Denies new lymphadenopathy or easy bruising Neurological:Denies numbness, tingling or new weaknesses Behavioral/Psych: Mood is stable, no new changes  Breast:  Denies any palpable lumps or discharge All other systems were reviewed with the patient and are negative.  PHYSICAL EXAMINATION: ECOG PERFORMANCE STATUS: 1 - Symptomatic but completely ambulatory  Vitals:   10/22/20 0852  BP: 139/82  Pulse: 64  Resp: 19  Temp: (!) 97.5 F (36.4 C)  SpO2: 96%    Filed Weights   10/22/20 0852  Weight: 118 lb (53.5 kg)     LABORATORY DATA:  I have reviewed the data as listed Lab Results  Component Value Date   WBC 4.5 10/22/2020   HGB 11.9 (L) 10/22/2020   HCT 36.4 10/22/2020   MCV 86.9 10/22/2020   PLT 313 10/22/2020   Lab Results  Component Value Date   NA 142 10/22/2020   K 3.8 10/22/2020   CL 109 10/22/2020   CO2 24 10/22/2020    RADIOGRAPHIC STUDIES: I have personally reviewed the radiological reports and agreed with the findings in the report.  ASSESSMENT AND PLAN:  Malignant neoplasm of lower-inner quadrant of right breast of female, estrogen receptor positive ([place]) 10/13/2020:Screening mammogram showed indeterminate mass in the right breast. Diagnostic mammogram and US showed 1 cm x 1.3 cm suspicious irregular mass at 4:00 4 cm from the nipple. 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  Pathology and radiology counseling:Discussed with the patient, the details of pathology including the type of breast cancer,the clinical staging, the significance of ER, PR and HER-2/neu receptors and the implications for treatment. After reviewing the pathology in detail, we proceeded to discuss the different treatment options between surgery, radiation, chemotherapy, antiestrogen therapies.  Recommendations: 1. Breast conserving surgery followed by 2. +/- Adjuvant radiation therapy  3. Adjuvant antiestrogen therapy with letrozole once daily x5 years   Return to clinic after surgery to discuss final pathology report     All questions were answered. The patient knows to call the clinic with any problems, questions or concerns.   Rulon Eisenmenger, MD, MPH 10/22/2020    I, Thana Ates, am acting as scribe for Nicholas Lose, MD.  I have reviewed the above documentation for accuracy and completeness, and I agree with the above.

## 2020-10-22 ENCOUNTER — Inpatient Hospital Stay: Payer: Medicare Other | Attending: Hematology and Oncology | Admitting: Hematology and Oncology

## 2020-10-22 ENCOUNTER — Other Ambulatory Visit: Payer: Self-pay

## 2020-10-22 ENCOUNTER — Inpatient Hospital Stay: Payer: Medicare Other | Admitting: Licensed Clinical Social Worker

## 2020-10-22 ENCOUNTER — Encounter: Payer: Self-pay | Admitting: *Deleted

## 2020-10-22 ENCOUNTER — Encounter: Payer: Self-pay | Admitting: Hematology and Oncology

## 2020-10-22 ENCOUNTER — Ambulatory Visit: Payer: Self-pay | Admitting: Surgery

## 2020-10-22 ENCOUNTER — Ambulatory Visit
Admission: RE | Admit: 2020-10-22 | Discharge: 2020-10-22 | Disposition: A | Discharge status: Home / Self Care | Payer: Medicare Other | Source: Ambulatory Visit | Attending: Radiation Oncology | Admitting: Radiation Oncology

## 2020-10-22 ENCOUNTER — Ambulatory Visit (HOSPITAL_BASED_OUTPATIENT_CLINIC_OR_DEPARTMENT_OTHER): Payer: Medicare Other | Admitting: Genetic Counselor

## 2020-10-22 ENCOUNTER — Ambulatory Visit: Payer: Medicare Other | Admitting: Physical Therapy

## 2020-10-22 ENCOUNTER — Encounter: Payer: Self-pay | Admitting: Genetic Counselor

## 2020-10-22 ENCOUNTER — Inpatient Hospital Stay: Payer: Medicare Other

## 2020-10-22 DIAGNOSIS — M545 Low back pain, unspecified: Secondary | ICD-10-CM | POA: Diagnosis not present

## 2020-10-22 DIAGNOSIS — Z808 Family history of malignant neoplasm of other organs or systems: Secondary | ICD-10-CM | POA: Diagnosis not present

## 2020-10-22 DIAGNOSIS — C50911 Malignant neoplasm of unspecified site of right female breast: Secondary | ICD-10-CM

## 2020-10-22 DIAGNOSIS — C50311 Malignant neoplasm of lower-inner quadrant of right female breast: Secondary | ICD-10-CM | POA: Diagnosis not present

## 2020-10-22 DIAGNOSIS — Z79899 Other long term (current) drug therapy: Secondary | ICD-10-CM | POA: Diagnosis not present

## 2020-10-22 DIAGNOSIS — Z803 Family history of malignant neoplasm of breast: Secondary | ICD-10-CM

## 2020-10-22 DIAGNOSIS — I729 Aneurysm of unspecified site: Secondary | ICD-10-CM | POA: Diagnosis not present

## 2020-10-22 DIAGNOSIS — K2270 Barrett's esophagus without dysplasia: Secondary | ICD-10-CM | POA: Insufficient documentation

## 2020-10-22 DIAGNOSIS — K219 Gastro-esophageal reflux disease without esophagitis: Secondary | ICD-10-CM | POA: Insufficient documentation

## 2020-10-22 DIAGNOSIS — Z807 Family history of other malignant neoplasms of lymphoid, hematopoietic and related tissues: Secondary | ICD-10-CM

## 2020-10-22 DIAGNOSIS — Z170 Estrogen receptor positive status [ER+]: Secondary | ICD-10-CM

## 2020-10-22 DIAGNOSIS — I700 Atherosclerosis of aorta: Secondary | ICD-10-CM | POA: Diagnosis not present

## 2020-10-22 DIAGNOSIS — I10 Essential (primary) hypertension: Secondary | ICD-10-CM | POA: Insufficient documentation

## 2020-10-22 DIAGNOSIS — E785 Hyperlipidemia, unspecified: Secondary | ICD-10-CM | POA: Diagnosis not present

## 2020-10-22 DIAGNOSIS — Z8051 Family history of malignant neoplasm of kidney: Secondary | ICD-10-CM | POA: Diagnosis not present

## 2020-10-22 DIAGNOSIS — M810 Age-related osteoporosis without current pathological fracture: Secondary | ICD-10-CM | POA: Insufficient documentation

## 2020-10-22 LAB — CMP (CANCER CENTER ONLY)
ALT: 15 U/L (ref 0–44)
AST: 20 U/L (ref 15–41)
Albumin: 3.9 g/dL (ref 3.5–5.0)
Alkaline Phosphatase: 43 U/L (ref 38–126)
Anion gap: 9 (ref 5–15)
BUN: 27 mg/dL — ABNORMAL HIGH (ref 8–23)
CO2: 24 mmol/L (ref 22–32)
Calcium: 9.9 mg/dL (ref 8.9–10.3)
Chloride: 109 mmol/L (ref 98–111)
Creatinine: 0.75 mg/dL (ref 0.44–1.00)
GFR, Estimated: >60 mL/min (ref >60)
Glucose, Bld: 133 mg/dL — ABNORMAL HIGH (ref 70–99)
Potassium: 3.8 mmol/L (ref 3.5–5.1)
Sodium: 142 mmol/L (ref 135–145)
Total Bilirubin: 0.4 mg/dL (ref 0.3–1.2)
Total Protein: 7.5 g/dL (ref 6.5–8.1)

## 2020-10-22 LAB — CBC WITH DIFFERENTIAL (CANCER CENTER ONLY)
Abs Immature Granulocytes: 0.01 10*3/uL (ref 0.00–0.07)
Basophils Absolute: 0 10*3/uL (ref 0.0–0.1)
Basophils Relative: 1 %
Eosinophils Absolute: 0.2 10*3/uL (ref 0.0–0.5)
Eosinophils Relative: 4 %
HCT: 36.4 % (ref 36.0–46.0)
Hemoglobin: 11.9 g/dL — ABNORMAL LOW (ref 12.0–15.0)
Immature Granulocytes: 0 %
Lymphocytes Relative: 28 %
Lymphs Abs: 1.3 10*3/uL (ref 0.7–4.0)
MCH: 28.4 pg (ref 26.0–34.0)
MCHC: 32.7 g/dL (ref 30.0–36.0)
MCV: 86.9 fL (ref 80.0–100.0)
Monocytes Absolute: 0.6 10*3/uL (ref 0.1–1.0)
Monocytes Relative: 14 %
Neutro Abs: 2.4 10*3/uL (ref 1.7–7.7)
Neutrophils Relative %: 53 %
Platelet Count: 313 10*3/uL (ref 150–400)
RBC: 4.19 MIL/uL (ref 3.87–5.11)
RDW: 13.5 % (ref 11.5–15.5)
WBC Count: 4.5 10*3/uL (ref 4.0–10.5)
nRBC: 0 % (ref 0.0–0.2)

## 2020-10-22 LAB — GENETIC SCREENING ORDER

## 2020-10-22 NOTE — Assessment & Plan Note (Signed)
10/13/2020:Screening mammogram showed indeterminate mass in the right breast. Diagnostic mammogram and US showed 1 cm x 1.3 cm suspicious irregular mass at 4:00 4 cm from the nipple. 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  Pathology and radiology counseling:Discussed with the patient, the details of pathology including the type of breast cancer,the clinical staging, the significance of ER, PR and HER-2/neu receptors and the implications for treatment. After reviewing the pathology in detail, we proceeded to discuss the different treatment options between surgery, radiation, chemotherapy, antiestrogen therapies.  Recommendations: 1. Breast conserving surgery followed by 2. +/- Adjuvant radiation therapy  3. Adjuvant antiestrogen therapy with letrozole once daily x5 years   Return to clinic after surgery to discuss final pathology report

## 2020-10-22 NOTE — Progress Notes (Signed)
[facility]  Initial Assessment   Lori [name] is a 80 y.o. year old female accompanied by son, Wilfred Lacy, and husband Ronald Pippins. Clinical Social Work was referred by [facility] for assessment of psychosocial needs.   SDOH (Social Determinants of Health) assessments performed: Yes SDOH Interventions    Flowsheet Row Most Recent Value  SDOH Interventions   Food Insecurity Interventions Intervention Not Indicated  Financial Strain Interventions Intervention Not Indicated  Housing Interventions Intervention Not Indicated  Transportation Interventions Intervention Not Indicated       Distress Screen completed: Yes ONCBCN DISTRESS SCREENING 10/22/2020  Screening Type Initial Screening  Distress experienced in past week (1-10) 2  Practical problem type Insurance  Emotional problem type Adjusting to illness  Information Concerns Type Lack of info about diagnosis  Physical Problem type Tingling hands/feet      Family/Social Information:  Housing Arrangement: patient lives with husband. 4 adult children- 3 sons in [place], 1 daughter in [place].  Family members/support persons in your life? Family, Friends, and Architectural technologist group Transportation concerns: no  Employment: Retired. Income source: Paediatric nurse concerns: No Type of concern: None Food access concerns: no Religious or spiritual practice: yes, belongs to a small group through church and has friends praying for her Medication Concerns: potentially cost  Services Currently in place:  n/a  Coping/ Adjustment to diagnosis: Patient understands treatment plan and what happens next? yes Concerns about diagnosis and/or treatment:  potentially cost but knows she has good insurance and to contact this office if any medications are too costly Patient reported stressors: Adjusting to my illness Current coping skills/ strengths: Capable of independent living, Motivation for treatment/growth, Religious  Affiliation, and Supportive family/friends    SUMMARY: Current SDOH Barriers:  No significant SDOH barriers noted today   Interventions: Discussed common feeling and emotions when being diagnosed with cancer, and the importance of support during treatment Informed patient of the support team roles and support services at [facility] Provided CSW contact information and encouraged patient to call with any questions or concerns   Follow Up Plan: Patient will contact CSW with any support or resource needs Patient verbalizes understanding of plan: Yes    Christeen Douglas LCSW

## 2020-10-22 NOTE — Progress Notes (Signed)
REFERRING PROVIDER: Nicholas Lose, MD [place],  [place] [address]  PRIMARY PROVIDER:  [name], MD  PRIMARY REASON FOR VISIT:  1. Malignant neoplasm of lower-inner quadrant of right breast of female, estrogen receptor positive ([place])   2. Family history of kidney cancer   3. Family history of thyroid cancer   4. Family history of multiple myeloma   5. Family history of breast cancer       HISTORY OF PRESENT ILLNESS:   Ms. Darr, a 80 y.o. female, was seen for a [place] cancer genetics consultation at the request of Dr. Lindi Adie due to a personal and family history of cancer.  Ms. Bozarth presents to clinic today to discuss the possibility of a hereditary predisposition to cancer, genetic testing, and to further clarify her future cancer risks, as well as potential cancer risks for family members.   In July of 2022, at the age of 78, Ms. 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 The tumor is ER+/PR+/Her2-. The treatment plan includes surgery, radiation therapy as appropriate, and antiestrogen therapy.    CANCER HISTORY:  Oncology History  Malignant neoplasm of lower-inner quadrant of right breast of female, estrogen receptor positive ([place])  10/13/2020 Initial Diagnosis   Screening mammogram showed indeterminate mass in the right breast. Diagnostic mammogram and US showed 1 cm x 1.3 cm suspicious irregular mass at 4:00 4 cm from the nipple. 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   10/22/2020 Cancer Staging   Staging form: Breast, AJCC 8th Edition - Clinical stage from 10/22/2020: Stage IA (cT1b, cN0, cM0, G2, ER+, PR+, HER2-) - Signed by Nicholas Lose, MD on 10/22/2020  Stage prefix: Initial diagnosis  Histologic grading system: 3 grade system       RISK FACTORS:  Menarche was at age 65.  First live birth at age 45.  OCP use for approximately 0  years.  Ovaries intact: yes.  Hysterectomy: no.  Menopausal status: postmenopausal.  HRT use:  6 months . Colonoscopy: yes;  05/03/2018, hx of colon polyps (less than 10 precancerous, per patient) . Mammogram within the last year: yes.   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   Aortic atherosclerosis (HCC)    Arthritis    knee- DJD    Barrett's esophagus    Breast cancer (HCC)    Burning mouth syndrome    Dr Redmond Baseman 11-2016 - no smell or taste x 4 yrs per pt    Cataract    bilateral    Clotting disorder ([place]) 1988   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    Eczema    Family history of breast cancer    Family history of kidney cancer    Family history of multiple myeloma    Family history of thyroid cancer    GERD (gastroesophageal reflux disease)    Headache(784.0)    History of IBS    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    Hyperlipidemia    on medication   Hypertension    Low back pain    Meniere disease    NEPHROLITHIASIS 12/10/2008   Neuromuscular disorder ([place])    Hiatal Hernia, Raynauds disease   Osteopenia    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[place])  Stroke [facility]) 1988   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 carotid artery dissection    Tubular adenoma of colon 02/2013   Varicose veins with inflammation    upper and lower per pt    Vasculitis [facility])     Past Surgical History:  Procedure Laterality Date   arthroscopic knee  2009   left knee/ torn meniscus   BUNIONECTOMY Right 1998   with other foot surgery    carotid artery disection  1988   Carotid Artery Dissection   CATARACT EXTRACTION, BILATERAL  07-18-2017,08-08-2017   CESAREAN SECTION  1988   1 time   COLONOSCOPY  2019   last 2019   DILATION AND CURETTAGE OF UTERUS  2004   POLYPECTOMY     POPLITEAL SYNOVIAL CYST EXCISION     left leg   TONSILLECTOMY  1957   UPPER GASTROINTESTINAL ENDOSCOPY     last  2018    Social History   Socioeconomic History   Marital status: Married    Spouse name: Not on file   Number of children: Not on file   Years of education: Not on file   Highest education level: Not on file  Occupational History   Occupation: retired  Tobacco Use   Smoking status: Never   Smokeless tobacco: Never  Substance and Sexual Activity   Alcohol use: No    Alcohol/week: 0.0 standard drinks   Drug use: No   Sexual activity: Yes  Other Topics Concern   Not on file  Social History Narrative   Lives with husband who is also a patient of Dr. Durene Cal   Social Determinants of Health   Financial Resource Strain: Low Risk    Difficulty of Paying Living Expenses: Not hard at all  Food Insecurity: No Food Insecurity   Worried About Programme researcher, broadcasting/film/video in the Last Year: Never true   Ran Out of Food in the Last Year: Never true  Transportation Needs: No Transportation Needs   Lack of Transportation (Medical): No   Lack of Transportation (Non-Medical): No  Physical Activity: Sufficiently Active   Days of Exercise per Week: 6 days   Minutes of Exercise per Session: 50 min  Stress: No Stress Concern Present   Feeling of Stress : Not at all  Social Connections: Socially Integrated   Frequency of Communication with Friends and Family: More than three times a week   Frequency of Social Gatherings with Friends and Family: More than three times a week   Attends Religious Services: More than 4 times per year   Active Member of [employer] or Organizations: Yes   Attends Banker Meetings: 1 to 4 times per year   Marital Status: Married     FAMILY HISTORY:  We obtained a detailed, 4-generation family history.  Significant diagnoses are listed below: Family History  Problem Relation Age of Onset   Multiple myeloma Father 91   Thyroid cancer Sister 33       s/p removal. papilary and anaplastic.    Lung cancer Sister    Kidney disease Brother        cancer- removed    Kidney cancer Brother        dx early 66s   Alzheimer's disease Brother    Stroke Brother 94   Breast cancer Cousin 2       paternal first cousin   Cancer Cousin        unknown type, paternal first cousin   Breast cancer Other  mother's first cousin   Cervical cancer Other    Colon cancer Neg Hx    Esophageal cancer Neg Hx    Rectal cancer Neg Hx    Stomach cancer Neg Hx    Colon polyps Neg Hx    Ms. Quinteros has one daughter (age 30) and three sons (ages 42-54). She had two brothers and one sister. One brother had kidney cancer in his early 7s. Her sister had thyroid cancer (papillary and anaplastic) at age 26.  Ms. Pultz mother died at age 51 without cancer. There was one maternal aunt and three or four maternal uncles. There is no known cancer among maternal aunts/uncles or maternal first cousins. Ms. Huntsberry maternal grandmother died at age 32 without cancer. Her maternal grandfather died older than 71 without cancer. One of her mother's cousins had breast cancer and cervical cancer, and this relative's son had cancer as well (unknown type).  Ms. Tigue father died at age 79 with multiple myeloma. There were three paternal aunts and three paternal uncles. There is no known cancer among paternal aunts/uncles. One paternal cousin had breast cancer around age 50. Another cousin had cancer (unknown type). Ms. Palladino paternal grandmother died at age 68 without cancer. Her paternal grandfather died older than 87 without cancer.  Ms. Pusch is unaware of previous family history of genetic testing for hereditary cancer risks. Patient's maternal ancestors are of [place] and [place] descent, and paternal ancestors are of [place] descent. There is no reported Ashkenazi Jewish ancestry. There is no known consanguinity.  GENETIC COUNSELING ASSESSMENT: Ms. Klinke is a 80 y.o. female with a personal and family history of cancer which is not strongly suggestive of a hereditary  cancer. We, therefore, discussed and recommended the following at today's visit.   DISCUSSION: We discussed that approximately 5-10% of breast cancer is hereditary, with most cases associated with the BRCA1 and BRCA2 genes. There are other genes that can be associated with hereditary breast cancer syndromes. These include ATM, CHEK2, PALB2, etc. We discussed that testing is beneficial for several reasons, including knowing about other cancer risks, identifying potential screening and risk-reduction options that may be appropriate, and to understand if other family members could be at risk for cancer and allow them to undergo genetic testing.   We reviewed the characteristics, features and inheritance patterns of hereditary cancer syndromes. We discussed with Ms. Carvey that the personal and family history does not meet NCCN criteria for genetic testing and, therefore, is not highly consistent with a familial hereditary cancer syndrome.  We feel she is at low risk to harbor a gene mutation associated with such a condition. However, some professional guidelines recommend genetic testing for all patients with breast cancer, so it is reasonable to consider testing in Ms. Motta's case.   Ms. Borunda is interested in proceeding with genetics testing. We discussed genetic testing, including the appropriate family members to test, the process of testing, insurance coverage and turn-around-time for results. We discussed the implications of a negative, positive and/or variant of uncertain significant result. We recommended Ms. Dhillon pursue genetic testing for the [facility] CancerNext-Expanded + RNAinsight panel.   The CancerNext-Expanded + RNAinsight gene panel offered by [employer] and includes sequencing and rearrangement analysis for the following 77 genes: AIP, ALK, APC, ATM, AXIN2, BAP1, BARD1, BLM, BMPR1A, BRCA1, BRCA2, BRIP1, CDC73, CDH1, CDK4, CDKN1B, CDKN2A, CHEK2, CTNNA1, DICER1, FANCC, FH, FLCN,  GALNT12, KIF1B, LZTR1, MAX, MEN1, MET, MLH1, MSH2, MSH3, MSH6, MUTYH, NBN, NF1, NF2, NTHL1, PALB2,  PHOX2B, PMS2, POT1, PRKAR1A, PTCH1, PTEN, RAD51C, RAD51D, RB1, RECQL, RET, SDHA, SDHAF2, SDHB, SDHC, SDHD, SMAD4, SMARCA4, SMARCB1, SMARCE1, STK11, SUFU, TMEM127, TP53, TSC1, TSC2, VHL and XRCC2 (sequencing and deletion/duplication); EGFR, EGLN1, HOXB13, KIT, MITF, PDGFRA, POLD1 and POLE (sequencing only); EPCAM and GREM1 (deletion/duplication only). RNA data is routinely analyzed for use in variant interpretation for all genes.  Based on Ms. Demma's personal and family history of cancer, she meets medical criteria for genetic testing. Despite that she meets criteria, there may still be an out of pocket cost. We discussed that if her out of pocket cost for testing is over $100, the laboratory will reach out to let her know. If the out of pocket cost of testing is less than $100 she will be billed by the genetic testing laboratory.   PLAN: After considering the risks, benefits, and limitations, Ms. Furtick provided informed consent to pursue genetic testing and the blood sample was sent to [facility] for analysis of the CancerNext-Expanded + RNAinsight panel. Results should be available within approximately two-three weeks' time, at which point they will be disclosed by telephone to Ms. Scali, as will any additional recommendations warranted by these results. Ms. Laforest will receive a summary of her genetic counseling visit and a copy of her results once available. This information will also be available in Epic.   Ms. Beavin questions were answered to her satisfaction today. Our contact information was provided should additional questions or concerns arise. Thank you for the referral and allowing [place] to share in the care of your patient.   Clint Guy, [place], [facility] Licensed, Certified Dispensing [email or web address] Phone: [phone]  The patient was seen for a  total of 25 minutes in face-to-face genetic counseling. Patient was seen with her husband, Ronald Pippins, and son, Wilfred Lacy. This patient was discussed with Drs. Magrinat, Lindi Adie and/or Burr Medico who agrees with the above.    _______________________________________________________________________ For Office Staff:  Number of people involved in session: 1 Was an Intern/ student involved with case: no

## 2020-10-23 ENCOUNTER — Telehealth: Payer: Self-pay | Admitting: Hematology and Oncology

## 2020-10-23 ENCOUNTER — Telehealth: Payer: Self-pay | Admitting: *Deleted

## 2020-10-23 ENCOUNTER — Encounter: Payer: Self-pay | Admitting: *Deleted

## 2020-10-23 DIAGNOSIS — Z808 Family history of malignant neoplasm of other organs or systems: Secondary | ICD-10-CM | POA: Diagnosis not present

## 2020-10-23 DIAGNOSIS — Z803 Family history of malignant neoplasm of breast: Secondary | ICD-10-CM | POA: Diagnosis not present

## 2020-10-23 DIAGNOSIS — C50311 Malignant neoplasm of lower-inner quadrant of right female breast: Secondary | ICD-10-CM | POA: Diagnosis not present

## 2020-10-23 DIAGNOSIS — Z8051 Family history of malignant neoplasm of kidney: Secondary | ICD-10-CM | POA: Diagnosis not present

## 2020-10-23 NOTE — Progress Notes (Signed)
Radiation Oncology         [phone] ________________________________  Name: Lori [name] MRN: [id number]  Date: 10/22/2020  DOB: April 20, 1940  ON:GEXBMW, Brayton Mars, MD  Erroll Luna, MD     REFERRING PHYSICIAN: Erroll Luna, MD   DIAGNOSIS: The encounter diagnosis was Malignant neoplasm of lower-inner quadrant of right breast of female, estrogen receptor positive ([place]).   HISTORY OF PRESENT ILLNESS::Lori [name] is a 80 y.o. female who is seen for an initial consultation visit regarding the patient's diagnosis of right-sided breast cancer.  The patient was found to have a suspicious abnormality within the right breast on screening mammogram.  Ultrasound was performed and revealed a 0.9 cm tumor at the 4 o'clock position.  No axillary adenopathy was seen.  Biopsy was performed and this revealed a grade 2 invasive ductal carcinoma.  Receptor studies indicated that the tumor is estrogen receptor positive, progesterone receptor positive, and HER2/neu negative.  The Ki-67 staining was 10%.  Given this finding I been asked to see the patient for consideration of adjuvant radiation treatment.    PREVIOUS RADIATION THERAPY: No   PAST MEDICAL HISTORY:  has a past medical history of Allergy, Anemia, Aneurysm ([place]), Aortic atherosclerosis ([place]), Arthritis, Barrett's esophagus, Breast cancer ([place]), Burning mouth syndrome, Cataract, Clotting disorder ([place]) (1988), Eczema, Family history of breast cancer, Family history of kidney cancer, Family history of multiple myeloma, Family history of thyroid cancer, GERD (gastroesophageal reflux disease), Headache(784.0), History of IBS, Horner's syndrome, Hyperlipidemia, Hypertension, Low back pain, Meniere disease, NEPHROLITHIASIS (12/10/2008), Neuromuscular disorder ([place]), Osteopenia, 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[place]), Stroke ([place]) (1988), Tubular adenoma of colon (02/2013), Varicose veins with inflammation, and Vasculitis ([place]).     PAST  SURGICAL HISTORY: Past Surgical History:  Procedure Laterality Date   arthroscopic knee  2009   left knee/ torn meniscus   BUNIONECTOMY Right 1998   with other foot surgery    carotid artery disection  1988   Carotid Artery Dissection   CATARACT EXTRACTION, BILATERAL  07-18-2017,08-08-2017   CESAREAN SECTION  1988   1 time   COLONOSCOPY  2019   last 2019   [place] OF UTERUS  2004   POLYPECTOMY     POPLITEAL SYNOVIAL CYST EXCISION     left leg   TONSILLECTOMY  1957   UPPER GASTROINTESTINAL ENDOSCOPY     last 2018     FAMILY HISTORY: family history includes Alzheimer's disease in her brother; Breast cancer in an other family member; Breast cancer (age of onset: 17) in her cousin; Cancer in her cousin; Cervical cancer in an other family member; Kidney cancer in her brother; Kidney disease in her brother; Lung cancer in her sister; Multiple myeloma (age of onset: 32) in her father; Stroke (age of onset: 50) in her brother; Thyroid cancer (age of onset: 38) in her sister.   SOCIAL HISTORY:  reports that she has never smoked. She has never used smokeless tobacco. She reports that she does not drink alcohol and does not use drugs.   ALLERGIES: Hydrocodone-acetaminophen, Azithromycin, Neomycin, Penicillins, Pneumococcal vaccine polyvalent, and Pneumovax [pneumococcal polysaccharide vaccine]   MEDICATIONS:  Current Outpatient Medications  Medication Sig Dispense Refill   amLODipine (NORVASC) 2.5 MG tablet TAKE 1 TABLET BY MOUTH EVERY DAY 90 tablet 2   aspirin 81 MG tablet Take 81 mg by mouth daily. Evening     fenofibrate 160 MG tablet TAKE 1 TABLET BY MOUTH EVERY DAY 90 tablet 1   loratadine (CLARITIN) 10 MG  tablet Take 10 mg by mouth daily. Take 1/2 tablet daily     meclizine (ANTIVERT) 12.5 MG tablet Take 1 tablet (12.5 mg total) by mouth 3 (three) times daily as needed (off balance). (Patient not taking: No sig reported) 30 tablet 0   metoprolol tartrate (LOPRESSOR)  50 MG tablet TAKE 1/2 TABLET BY MOUTH 2 TIMES DAILY 90 tablet 1   montelukast (SINGULAIR) 10 MG tablet TAKE 1 TABLET BY MOUTH AT BEDTIME 90 tablet 1   pantoprazole (PROTONIX) 40 MG tablet TAKE 1 TABLET BY MOUTH EVERY DAY 90 tablet 1   Vitamin D, Cholecalciferol, 1000 units TABS Take by mouth daily. Morning     No current facility-administered medications for this encounter.     REVIEW OF SYSTEMS:  A 15 point review of systems is documented in the electronic medical record. This was obtained by the nursing staff. However, I reviewed this with the patient to discuss relevant findings and make appropriate changes.  Pertinent items are noted in HPI.    PHYSICAL EXAM:  vitals were not taken for this visit.   ECOG = 0  0 - Asymptomatic (Fully active, able to carry on all predisease activities without restriction)  1 - Symptomatic but completely ambulatory (Restricted in physically strenuous activity but ambulatory and able to carry out work of a light or sedentary nature. For example, light housework, office work)  2 - Symptomatic, <50% in bed during the day (Ambulatory and capable of all self care but unable to carry out any work activities. Up and about more than 50% of waking hours)  3 - Symptomatic, >50% in bed, but not bedbound (Capable of only limited self-care, confined to bed or chair 50% or more of waking hours)  4 - Bedbound (Completely disabled. Cannot carry on any self-care. Totally confined to bed or chair)  5 - Death   Eustace Pen MM, Creech RH, Tormey DC, et al. [phone]). "Toxicity and response criteria of the [facility]". [place] Oncol. 5 (6): 649-55  Alert, no distress   LABORATORY DATA:  Lab Results  Component Value Date   WBC 4.5 10/22/2020   HGB 11.9 (L) 10/22/2020   HCT 36.4 10/22/2020   MCV 86.9 10/22/2020   PLT 313 10/22/2020   Lab Results  Component Value Date   NA 142 10/22/2020   K 3.8 10/22/2020   CL 109 10/22/2020   CO2 24  10/22/2020   Lab Results  Component Value Date   ALT 15 10/22/2020   AST 20 10/22/2020   ALKPHOS 43 10/22/2020   BILITOT 0.4 10/22/2020      RADIOGRAPHY: No results found.     IMPRESSION/ PLAN:  The patient was seen today in multidisciplinary breast clinic for her recent diagnosis of a T1b N0 M0 invasive ductal carcinoma of the right breast.  She is felt to be a good candidate for breast conservation treatment and has discussed lumpectomy with surgery today.  I discussed the potential role of radiation treatment in this setting.  We discussed the potential benefit in terms of local control.  We also discussed that the patient has a very favorable tumor and may have the option to forego radiation treatment depending on her results from surgery.  We also discussed the impact of her overall health.  She states that she remains quite active, walking each day typically and feels that she is in reasonably good health.  We discussed in some detail as well the logistics of radiation treatment  if we did proceed with this which would be anticipated to be a 4-week course of hypofractionated radiation treatment to the right breast.  After this discussion, we discussed that it would be very reasonable to connect after surgery to review her final pathology and make a final decision with regards to adjuvant radiation treatment.  The patient was seen in person today in clinic.  The total time spent on the patient's visit today was 45 minutes, including chart review, direct discussion/evaluation with the patient, and coordination of care.        ________________________________   Jodelle Gross, MD, PhD   **Disclaimer: This note was dictated with voice recognition software. Similar sounding words can inadvertently be transcribed and this note may contain transcription errors which may not have been corrected upon publication of note.**

## 2020-10-23 NOTE — Telephone Encounter (Signed)
Scheduled appt per sch msg. Called and left msg. Mailed printout °

## 2020-10-23 NOTE — Telephone Encounter (Signed)
Left vm regarding bmdc from 10/22/20. Contact information provided for questions or needs. Scheduling msg sent for post op with Dr. Lindi Adie.

## 2020-10-28 ENCOUNTER — Other Ambulatory Visit: Payer: Self-pay

## 2020-10-28 ENCOUNTER — Encounter (HOSPITAL_BASED_OUTPATIENT_CLINIC_OR_DEPARTMENT_OTHER): Payer: Self-pay | Admitting: Surgery

## 2020-10-29 DIAGNOSIS — H02055 Trichiasis without entropian left lower eyelid: Secondary | ICD-10-CM | POA: Diagnosis not present

## 2020-11-03 DIAGNOSIS — C50311 Malignant neoplasm of lower-inner quadrant of right female breast: Secondary | ICD-10-CM | POA: Diagnosis not present

## 2020-11-03 NOTE — Progress Notes (Signed)

## 2020-11-04 ENCOUNTER — Encounter (HOSPITAL_BASED_OUTPATIENT_CLINIC_OR_DEPARTMENT_OTHER): Payer: Self-pay | Admitting: Surgery

## 2020-11-04 NOTE — Anesthesia Preprocedure Evaluation (Addendum)
Anesthesia Evaluation  Patient identified by MRN, date of birth, ID band Patient awake    Reviewed: Allergy & Precautions, NPO status , Patient's Chart, lab work & pertinent test results, reviewed documented beta blocker date and time   Airway Mallampati: II  TM Distance: >3 FB Neck ROM: Full    Dental no notable dental hx.    Pulmonary neg pulmonary ROS,    Pulmonary exam normal breath sounds clear to auscultation       Cardiovascular hypertension, Pt. on medications and Pt. on home beta blockers Normal cardiovascular exam Rhythm:Regular Rate:Normal  Raynaud's disease  Hx/o carotid dissection 1988   Neuro/Psych  Headaches, Anxiety  Neuromuscular disease CVA, No Residual Symptoms    GI/Hepatic Neg liver ROS, GERD  Medicated,Hx/o Barrett's esophagus   Endo/Other  Hyperlipidemia  Renal/GU negative Renal ROS  negative genitourinary   Musculoskeletal  (+) Arthritis , Osteoarthritis,  Polymyalgia rheumatica   Abdominal   Peds  Hematology  (+) anemia ,   Anesthesia Other Findings   Reproductive/Obstetrics                            Anesthesia Physical Anesthesia Plan  ASA: 3  Anesthesia Plan: General   Post-op Pain Management:    Induction: Intravenous  PONV Risk Score and Plan: 4 or greater and Treatment may vary due to age or medical condition and Ondansetron  Airway Management Planned: LMA  Additional Equipment:   Intra-op Plan:   Post-operative Plan: Extubation in OR  Informed Consent: I have reviewed the patients History and Physical, chart, labs and discussed the procedure including the risks, benefits and alternatives for the proposed anesthesia with the patient or authorized representative who has indicated his/her understanding and acceptance.     Dental advisory given  Plan Discussed with: CRNA and Anesthesiologist  Anesthesia Plan Comments: (Use #3 LMA)       Anesthesia Quick Evaluation

## 2020-11-05 ENCOUNTER — Encounter (HOSPITAL_BASED_OUTPATIENT_CLINIC_OR_DEPARTMENT_OTHER)
Admission: RE | Disposition: A | Discharge status: Home / Self Care | Payer: Self-pay | Source: Home / Self Care | Attending: Surgery

## 2020-11-05 ENCOUNTER — Other Ambulatory Visit: Payer: Self-pay

## 2020-11-05 ENCOUNTER — Ambulatory Visit (HOSPITAL_BASED_OUTPATIENT_CLINIC_OR_DEPARTMENT_OTHER): Payer: Medicare Other | Admitting: Certified Registered"

## 2020-11-05 ENCOUNTER — Ambulatory Visit (HOSPITAL_BASED_OUTPATIENT_CLINIC_OR_DEPARTMENT_OTHER)
Admission: RE | Admit: 2020-11-05 | Discharge: 2020-11-05 | Disposition: A | Discharge status: Home / Self Care | Payer: Medicare Other | Attending: Surgery | Admitting: Surgery

## 2020-11-05 ENCOUNTER — Encounter (HOSPITAL_BASED_OUTPATIENT_CLINIC_OR_DEPARTMENT_OTHER): Payer: Self-pay | Admitting: Surgery

## 2020-11-05 ENCOUNTER — Telehealth: Payer: Self-pay | Admitting: Genetic Counselor

## 2020-11-05 DIAGNOSIS — Z170 Estrogen receptor positive status [ER+]: Secondary | ICD-10-CM | POA: Insufficient documentation

## 2020-11-05 DIAGNOSIS — C50311 Malignant neoplasm of lower-inner quadrant of right female breast: Secondary | ICD-10-CM | POA: Insufficient documentation

## 2020-11-05 DIAGNOSIS — Z79899 Other long term (current) drug therapy: Secondary | ICD-10-CM | POA: Insufficient documentation

## 2020-11-05 DIAGNOSIS — Z7982 Long term (current) use of aspirin: Secondary | ICD-10-CM | POA: Insufficient documentation

## 2020-11-05 DIAGNOSIS — Z880 Allergy status to penicillin: Secondary | ICD-10-CM | POA: Insufficient documentation

## 2020-11-05 DIAGNOSIS — Z885 Allergy status to narcotic agent status: Secondary | ICD-10-CM | POA: Insufficient documentation

## 2020-11-05 DIAGNOSIS — Z887 Allergy status to serum and vaccine status: Secondary | ICD-10-CM | POA: Insufficient documentation

## 2020-11-05 DIAGNOSIS — Z881 Allergy status to other antibiotic agents status: Secondary | ICD-10-CM | POA: Diagnosis not present

## 2020-11-05 DIAGNOSIS — E559 Vitamin D deficiency, unspecified: Secondary | ICD-10-CM | POA: Diagnosis not present

## 2020-11-05 DIAGNOSIS — Z8673 Personal history of transient ischemic attack (TIA), and cerebral infarction without residual deficits: Secondary | ICD-10-CM | POA: Diagnosis not present

## 2020-11-05 DIAGNOSIS — C50911 Malignant neoplasm of unspecified site of right female breast: Secondary | ICD-10-CM

## 2020-11-05 DIAGNOSIS — Z803 Family history of malignant neoplasm of breast: Secondary | ICD-10-CM | POA: Insufficient documentation

## 2020-11-05 DIAGNOSIS — I700 Atherosclerosis of aorta: Secondary | ICD-10-CM | POA: Diagnosis not present

## 2020-11-05 HISTORY — DX: Anxiety disorder, unspecified: F41.9

## 2020-11-05 HISTORY — PX: BREAST LUMPECTOMY WITH RADIOACTIVE SEED LOCALIZATION: SHX6424

## 2020-11-05 HISTORY — DX: Personal history of urinary calculi: Z87.442

## 2020-11-05 SURGERY — BREAST LUMPECTOMY WITH RADIOACTIVE SEED LOCALIZATION
Anesthesia: General | Site: Breast | Laterality: Right

## 2020-11-05 MED ORDER — DEXAMETHASONE SODIUM PHOSPHATE 10 MG/ML IJ SOLN
INTRAMUSCULAR | Status: AC
  Filled 2020-11-05: qty 1

## 2020-11-05 MED ORDER — PROPOFOL 10 MG/ML IV BOLUS
INTRAVENOUS | Status: AC
  Filled 2020-11-05: qty 40

## 2020-11-05 MED ORDER — ONDANSETRON HCL 4 MG/2ML IJ SOLN
INTRAMUSCULAR | Status: AC
  Filled 2020-11-05: qty 2

## 2020-11-05 MED ORDER — ATROPINE SULFATE 0.4 MG/ML IJ SOLN
INTRAMUSCULAR | Status: AC
  Filled 2020-11-05: qty 1

## 2020-11-05 MED ORDER — DEXAMETHASONE SODIUM PHOSPHATE 10 MG/ML IJ SOLN
INTRAMUSCULAR | Status: DC | PRN
  Administered 2020-11-05: 5 mg via INTRAVENOUS

## 2020-11-05 MED ORDER — TRAMADOL HCL 50 MG PO TABS: 50.0000 mg | ORAL_TABLET | Freq: Four times a day (QID) | ORAL | 0 refills | Status: DC | PRN

## 2020-11-05 MED ORDER — FENTANYL CITRATE (PF) 100 MCG/2ML IJ SOLN: 25.0000 ug | INTRAMUSCULAR | Status: DC | PRN

## 2020-11-05 MED ORDER — CLINDAMYCIN PHOSPHATE 900 MG/50ML IV SOLN
900.0000 mg | INTRAVENOUS | Status: AC
  Administered 2020-11-05: 900 mg via INTRAVENOUS

## 2020-11-05 MED ORDER — SODIUM CHLORIDE 0.9 % IR SOLN
Status: DC | PRN
  Administered 2020-11-05: 500 mL

## 2020-11-05 MED ORDER — FENTANYL CITRATE (PF) 100 MCG/2ML IJ SOLN
INTRAMUSCULAR | Status: AC
  Filled 2020-11-05: qty 2

## 2020-11-05 MED ORDER — CHLORHEXIDINE GLUCONATE CLOTH 2 % EX PADS: 6.0000 | MEDICATED_PAD | Freq: Once | CUTANEOUS | Status: DC

## 2020-11-05 MED ORDER — ONDANSETRON HCL 4 MG/2ML IJ SOLN
INTRAMUSCULAR | Status: DC | PRN
  Administered 2020-11-05: 4 mg via INTRAVENOUS

## 2020-11-05 MED ORDER — LACTATED RINGERS IV SOLN: INTRAVENOUS | Status: DC

## 2020-11-05 MED ORDER — LIDOCAINE HCL (PF) 2 % IJ SOLN
INTRAMUSCULAR | Status: AC
  Filled 2020-11-05: qty 5

## 2020-11-05 MED ORDER — VANCOMYCIN HCL 500 MG IV SOLR
INTRAVENOUS | Status: AC
  Filled 2020-11-05: qty 20

## 2020-11-05 MED ORDER — LIDOCAINE HCL (CARDIAC) PF 100 MG/5ML IV SOSY
PREFILLED_SYRINGE | INTRAVENOUS | Status: DC | PRN
  Administered 2020-11-05: 60 mg via INTRAVENOUS

## 2020-11-05 MED ORDER — EPHEDRINE 5 MG/ML INJ
INTRAVENOUS | Status: AC
  Filled 2020-11-05: qty 10

## 2020-11-05 MED ORDER — SODIUM CHLORIDE 0.9 % IV SOLN
INTRAVENOUS | Status: AC
  Filled 2020-11-05 (×2): qty 10

## 2020-11-05 MED ORDER — CHLORHEXIDINE GLUCONATE CLOTH 2 % EX PADS
6.0000 | MEDICATED_PAD | Freq: Once | CUTANEOUS | Status: DC
Start: 2020-11-05 — End: 2020-11-05

## 2020-11-05 MED ORDER — FENTANYL CITRATE (PF) 100 MCG/2ML IJ SOLN
INTRAMUSCULAR | Status: DC | PRN
  Administered 2020-11-05: 25 ug via INTRAVENOUS

## 2020-11-05 MED ORDER — CLINDAMYCIN PHOSPHATE 900 MG/50ML IV SOLN
INTRAVENOUS | Status: AC
  Filled 2020-11-05: qty 50

## 2020-11-05 MED ORDER — PROPOFOL 10 MG/ML IV BOLUS
INTRAVENOUS | Status: DC | PRN
  Administered 2020-11-05: 90 mg via INTRAVENOUS

## 2020-11-05 MED ORDER — BUPIVACAINE-EPINEPHRINE (PF) 0.25% -1:200000 IJ SOLN
INTRAMUSCULAR | Status: DC | PRN
  Administered 2020-11-05: 20 mL

## 2020-11-05 MED ORDER — EPHEDRINE SULFATE 50 MG/ML IJ SOLN
INTRAMUSCULAR | Status: DC | PRN
Start: 2020-11-05 — End: 2020-11-05
  Administered 2020-11-05 (×4): 10 mg via INTRAVENOUS

## 2020-11-05 MED ORDER — VANCOMYCIN HCL 500 MG IV SOLR
INTRAVENOUS | Status: DC | PRN
  Administered 2020-11-05: 500 mg via TOPICAL

## 2020-11-05 SURGICAL SUPPLY — 46 items
ADH SKN CLS APL DERMABOND .7 (GAUZE/BANDAGES/DRESSINGS) ×1
APL PRP STRL LF DISP 70% ISPRP (MISCELLANEOUS) ×1
APPLIER CLIP 9.375 MED OPEN (MISCELLANEOUS) ×2
APR CLP MED 9.3 20 MLT OPN (MISCELLANEOUS) ×1
BINDER BREAST MEDIUM (GAUZE/BANDAGES/DRESSINGS) ×2 IMPLANT
BLADE SURG 15 STRL LF DISP TIS (BLADE) ×1 IMPLANT
BLADE SURG 15 STRL SS (BLADE) ×2
CANISTER SUCT 1200ML W/VALVE (MISCELLANEOUS) ×2 IMPLANT
CHLORAPREP W/TINT 26 (MISCELLANEOUS) ×2 IMPLANT
CLIP APPLIE 9.375 MED OPEN (MISCELLANEOUS) ×1 IMPLANT
COVER BACK TABLE 60X90IN (DRAPES) ×2 IMPLANT
COVER MAYO STAND STRL (DRAPES) ×2 IMPLANT
COVER PROBE W GEL 5X96 (DRAPES) ×2 IMPLANT
DERMABOND ADVANCED (GAUZE/BANDAGES/DRESSINGS) ×1
DERMABOND ADVANCED .7 DNX12 (GAUZE/BANDAGES/DRESSINGS) ×1 IMPLANT
DRAPE LAPAROTOMY 100X72 PEDS (DRAPES) ×2 IMPLANT
DRAPE UTILITY XL STRL (DRAPES) ×2 IMPLANT
ELECT COATED BLADE 2.86 ST (ELECTRODE) ×2 IMPLANT
ELECT REM PT RETURN 9FT ADLT (ELECTROSURGICAL) ×2
ELECTRODE REM PT RTRN 9FT ADLT (ELECTROSURGICAL) ×1 IMPLANT
GLOVE SRG 8 PF TXTR STRL LF DI (GLOVE) ×1 IMPLANT
GLOVE SURG LTX SZ8 (GLOVE) ×2 IMPLANT
GLOVE SURG POLYISO LF SZ6.5 (GLOVE) ×2 IMPLANT
GLOVE SURG UNDER POLY LF SZ6.5 (GLOVE) ×2 IMPLANT
GLOVE SURG UNDER POLY LF SZ7 (GLOVE) ×2 IMPLANT
GLOVE SURG UNDER POLY LF SZ8 (GLOVE) ×2
GOWN STRL REUS W/ TWL LRG LVL3 (GOWN DISPOSABLE) ×2 IMPLANT
GOWN STRL REUS W/ TWL XL LVL3 (GOWN DISPOSABLE) ×1 IMPLANT
GOWN STRL REUS W/TWL LRG LVL3 (GOWN DISPOSABLE) ×4
GOWN STRL REUS W/TWL XL LVL3 (GOWN DISPOSABLE) ×2
HEMOSTAT ARISTA ABSORB 3G PWDR (HEMOSTASIS) IMPLANT
HEMOSTAT SNOW SURGICEL 2X4 (HEMOSTASIS) IMPLANT
KIT MARKER MARGIN INK (KITS) ×2 IMPLANT
NEEDLE HYPO 25X1 1.5 SAFETY (NEEDLE) ×2 IMPLANT
NS IRRIG 1000ML POUR BTL (IV SOLUTION) ×2 IMPLANT
PACK BASIN DAY SURGERY FS (CUSTOM PROCEDURE TRAY) ×2 IMPLANT
PENCIL SMOKE EVACUATOR (MISCELLANEOUS) ×2 IMPLANT
SLEEVE SCD COMPRESS KNEE MED (STOCKING) ×2 IMPLANT
SPONGE T-LAP 4X18 ~~LOC~~+RFID (SPONGE) ×2 IMPLANT
SUT MNCRL AB 4-0 PS2 18 (SUTURE) ×2 IMPLANT
SUT VICRYL 3-0 CR8 SH (SUTURE) ×2 IMPLANT
SYR CONTROL 10ML LL (SYRINGE) ×2 IMPLANT
TOWEL GREEN STERILE FF (TOWEL DISPOSABLE) ×2 IMPLANT
TRAY FAXITRON CT DISP (TRAY / TRAY PROCEDURE) ×2 IMPLANT
TUBE CONNECTING 20X1/4 (TUBING) ×2 IMPLANT
YANKAUER SUCT BULB TIP NO VENT (SUCTIONS) ×2 IMPLANT

## 2020-11-05 NOTE — Anesthesia Postprocedure Evaluation (Signed)
Anesthesia Post Note  Patient: Lori [name]  Procedure(s) Performed: RIGHT BREAST LUMPECTOMY WITH RADIOACTIVE SEED LOCALIZATION (Right: Breast)     Patient location during evaluation: PACU Anesthesia Type: General Level of consciousness: awake and alert and oriented Pain management: pain level controlled Vital Signs Assessment: post-procedure vital signs reviewed and stable Respiratory status: spontaneous breathing, nonlabored ventilation and respiratory function stable Cardiovascular status: blood pressure returned to baseline and stable Postop Assessment: no apparent nausea or vomiting Anesthetic complications: no   No notable events documented.  Last Vitals:  Vitals:   11/05/20 0930 11/05/20 0945  BP: (!) 159/64 (!) 146/65  Pulse: 62 72  Resp: 12 19  Temp:    SpO2: 100% 92%    Last Pain:  Vitals:   11/05/20 0945  TempSrc:   PainSc: 0-No pain                 Catalyna Reilly A.
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
## 2020-11-05 NOTE — Discharge Instructions (Addendum)
[facility] Office Phone Number [phone]  BREAST BIOPSY/ PARTIAL MASTECTOMY: POST OP INSTRUCTIONS  Always review your discharge instruction sheet given to you by the facility where your surgery was performed.  IF YOU HAVE DISABILITY OR FAMILY LEAVE FORMS, YOU MUST BRING THEM TO THE OFFICE FOR PROCESSING.  DO NOT GIVE THEM TO YOUR DOCTOR.  A prescription for pain medication may be given to you upon discharge.  Take your pain medication as prescribed, if needed.  If narcotic pain medicine is not needed, then you may take acetaminophen (Tylenol) or ibuprofen (Advil) as needed. Take your usually prescribed medications unless otherwise directed If you need a refill on your pain medication, please contact your pharmacy.  They will contact our office to request authorization.  Prescriptions will not be filled after 5pm or on week-ends. You should eat very light the first 24 hours after surgery, such as soup, crackers, pudding, etc.  Resume your normal diet the day after surgery. Most patients will experience some swelling and bruising in the breast.  Ice packs and a good support bra will help.  Swelling and bruising can take several days to resolve.  It is common to experience some constipation if taking pain medication after surgery.  Increasing fluid intake and taking a stool softener will usually help or prevent this problem from occurring.  A mild laxative (Milk of Magnesia or Miralax) should be taken according to package directions if there are no bowel movements after 48 hours. Unless discharge instructions indicate otherwise, you may remove your bandages 24-48 hours after surgery, and you may shower at that time.  You may have steri-strips (small skin tapes) in place directly over the incision.  These strips should be left on the skin for 7-10 days.  If your surgeon used skin glue on the incision, you may shower in 24 hours.  The glue will flake off over the next 2-3 weeks.  Any  sutures or staples will be removed at the office during your follow-up visit. ACTIVITIES:  You may resume regular daily activities (gradually increasing) beginning the next day.  Wearing a good support bra or sports bra minimizes pain and swelling.  You may have sexual intercourse when it is comfortable. You may drive when you no longer are taking prescription pain medication, you can comfortably wear a seatbelt, and you can safely maneuver your car and apply brakes. RETURN TO WORK:  ______________________________________________________________________________________ You should see your doctor in the office for a follow-up appointment approximately two weeks after your surgery.  Your doctor's nurse will typically make your follow-up appointment when she calls you with your pathology report.  Expect your pathology report 2-3 business days after your surgery.  You may call to check if you do not hear from us after three days. OTHER INSTRUCTIONS: _______________________________________________________________________________________________ _____________________________________________________________________________________________________________________________________ _____________________________________________________________________________________________________________________________________ _____________________________________________________________________________________________________________________________________  WHEN TO CALL YOUR DOCTOR: Fever over 101.0 Nausea and/or vomiting. Extreme swelling or bruising. Continued bleeding from incision. Increased pain, redness, or drainage from the incision.  The clinic staff is available to answer your questions during regular business hours.  Please don't hesitate to call and ask to speak to one of the nurses for clinical concerns.  If you have a medical emergency, go to the nearest emergency room or call 911.  A surgeon from [facility] is always on call at the hospital.  For further questions, please visit [email or web address]    Post Anesthesia Home Care Instructions  Activity: Get plenty of rest for the remainder of   the day. A responsible individual must stay with you for 24 hours following the procedure.  For the next 24 hours, DO NOT: -Drive a car -Operate machinery -Drink alcoholic beverages -Take any medication unless instructed by your physician -Make any legal decisions or sign important papers.  Meals: Start with liquid foods such as gelatin or soup. Progress to regular foods as tolerated. Avoid greasy, spicy, heavy foods. If nausea and/or vomiting occur, drink only clear liquids until the nausea and/or vomiting subsides. Call your physician if vomiting continues.  Special Instructions/Symptoms: Your throat may feel dry or sore from the anesthesia or the breathing tube placed in your throat during surgery. If this causes discomfort, gargle with warm salt water. The discomfort should disappear within 24 hours.  If you had a scopolamine patch placed behind your ear for the management of post- operative nausea and/or vomiting:  1. The medication in the patch is effective for 72 hours, after which it should be removed.  Wrap patch in a tissue and discard in the trash. Wash hands thoroughly with soap and water. 2. You may remove the patch earlier than 72 hours if you experience unpleasant side effects which may include dry mouth, dizziness or visual disturbances. 3. Avoid touching the patch. Wash your hands with soap and water after contact with the patch.      

## 2020-11-05 NOTE — Anesthesia Procedure Notes (Signed)
Procedure Name: LMA Insertion Date/Time: 11/05/2020 8:34 AM Performed by: Lavonia Dana, CRNA Pre-anesthesia Checklist: Patient identified, Emergency Drugs available, Suction available and Patient being monitored Patient Re-evaluated:Patient Re-evaluated prior to induction Oxygen Delivery Method: Circle system utilized Preoxygenation: Pre-oxygenation with 100% oxygen Induction Type: IV induction Ventilation: Mask ventilation without difficulty LMA: LMA inserted LMA Size: 3.0 Number of attempts: 1 Airway Equipment and Method: Bite block Placement Confirmation: positive ETCO2 Tube secured with: Tape Dental Injury: Teeth and Oropharynx as per pre-operative assessment

## 2020-11-05 NOTE — Transfer of Care (Signed)
Immediate Anesthesia Transfer of Care Note  Patient: Laron Pique  Procedure(s) Performed: RIGHT BREAST LUMPECTOMY WITH RADIOACTIVE SEED LOCALIZATION (Right: Breast)  Patient Location: PACU  Anesthesia Type:General  Level of Consciousness: drowsy  Airway & Oxygen Therapy: Patient Spontanous Breathing and Patient connected to face mask oxygen  Post-op Assessment: Report given to RN and Post -op Vital signs reviewed and stable  Post vital signs: Reviewed and stable  Last Vitals:  Vitals Value Taken Time  BP    Temp    Pulse    Resp    SpO2      Last Pain:  Vitals:   11/05/20 0711  TempSrc: Oral  PainSc: 0-No pain         Complications: No notable events documented.

## 2020-11-05 NOTE — Telephone Encounter (Signed)
LVM that her genetic test results are available and requested that she call back to discuss them.  

## 2020-11-05 NOTE — Interval H&P Note (Signed)
History and Physical Interval Note:  11/05/2020 8:14 AM  Lori [name]  has presented today for surgery, with the diagnosis of RIGHT BREAST CANCER.  The various methods of treatment have been discussed with the patient and family. After consideration of risks, benefits and other options for treatment, the patient has consented to  Procedure(s): RIGHT BREAST LUMPECTOMY WITH RADIOACTIVE SEED LOCALIZATION (Right) as a surgical intervention.  The patient's history has been reviewed, patient examined, no change in status, stable for surgery.  I have reviewed the patient's chart and labs.  Questions were answered to the patient's satisfaction.     [place]

## 2020-11-05 NOTE — Op Note (Signed)
Preoperative diagnosis: Stage I right breast cancer lower inner quadrant  Postoperative diagnosis: Same  Procedure: Right breast seed localized lumpectomy  Surgeon: Erroll Luna, MD  Anesthesia: LMA with 0.25% Marcaine plain  EBL: Minimal  Specimen: Right breast tissue with seed and clip verified by Faxitron and oriented with ink sent to pathology  Drains: None  IV fluids: Per anesthesia record  Indications for procedure: The patient is a 80 year old female with stage I right breast cancer.  She opted for lumpectomy alone.  We discussed the pros and cons of sentinel lymph node mapping given her grade, stage in size of breast cancer and markers.  We discussed the pros and cons of a lymph node in the setting with the low likelihood it would change her treatment or survival.  She opted against sent due to risk of lymphedema.The procedure has been discussed with the patient. Alternatives to surgery have been discussed with the patient.  Risks of surgery include bleeding,  Infection,  Seroma formation, death,  and the need for further surgery.   The patient understands and wishes to proceed.      Description of procedure: The patient was met in the holding area and questions were answered.  Neoprobe used to identify the seed right breast medial to lower inner quadrant.  This was marked.  She was then taken back to the operating room.  She was placed supine upon the OR table.  After induction of general anesthesia, right breast was prepped and draped in sterile fashion and timeout performed.  Proper patient, site and procedure were verified.  Neoprobe used and incision was made over the medial breast.  Dissection was carried down all tissue around the seed and clip were excised with a grossly negative margin.  The Faxitron revealed the seed and clip to be present.  Hemostasis was achieved with cautery.  Irrigation used.  Vancomycin powder placed.  Wound closed with a deep layer 3-0 Vicryl and 4  Monocryl.  Dermabond applied.  All counts were found to be correct.  The patient was awoke extubated taken to recovery in satisfactory condition.

## 2020-11-06 ENCOUNTER — Encounter (HOSPITAL_BASED_OUTPATIENT_CLINIC_OR_DEPARTMENT_OTHER): Payer: Self-pay | Admitting: Surgery

## 2020-11-07 ENCOUNTER — Other Ambulatory Visit: Payer: Self-pay

## 2020-11-07 ENCOUNTER — Ambulatory Visit (INDEPENDENT_AMBULATORY_CARE_PROVIDER_SITE_OTHER): Payer: Medicare Other | Admitting: Physician Assistant

## 2020-11-07 ENCOUNTER — Encounter: Payer: Self-pay | Admitting: Physician Assistant

## 2020-11-07 VITALS — BP 155/78 | HR 68 | Temp 97.4°F | Ht 65.0 in | Wt 118.2 lb

## 2020-11-07 DIAGNOSIS — I10 Essential (primary) hypertension: Secondary | ICD-10-CM

## 2020-11-07 DIAGNOSIS — R110 Nausea: Secondary | ICD-10-CM | POA: Diagnosis not present

## 2020-11-07 DIAGNOSIS — G902 Horner's syndrome: Secondary | ICD-10-CM

## 2020-11-07 MED ORDER — ONDANSETRON 4 MG PO TBDP
4.0000 mg | ORAL_TABLET | Freq: Three times a day (TID) | ORAL | 0 refills | Status: DC | PRN
Start: 2020-11-07 — End: 2022-01-06

## 2020-11-07 NOTE — Progress Notes (Signed)
Acute Office Visit  Subjective:    Patient ID: Lori [name], female    DOB: Apr 10, 1940, 80 y.o.   MRN: [id number]  Chief Complaint  Patient presents with   Eye Pain    Left eye   Nausea    HPI Patient is in today for a few complaints.   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 Sees Dr. Clelia Croft at [facility]. States she had left lower eyelashes removed last week. Sometimes will get headaches behind left eye and takes Tylenol for pain. She has some pulling pain today, but says this is common with her Syndrome. Denies any new changes in vision, redness, drainage, severe headaches, dizziness, or other concerns.  Says she has been feeling nauseated off & on as well, and is requesting dissolvable medication for this to have as needed. Last felt nauseated after having a glass of coca cola the other day.   Hx of R lumpectomy two days ago and still in some pain today.  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    Eczema    Family history of breast cancer    Family history of kidney cancer    Family history of multiple myeloma    Family history of thyroid cancer    GERD (gastroesophageal reflux disease)    Headache(784.0)    History of IBS    History of kidney stones    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    Hyperlipidemia    on medication   Hypertension    Low back pain    Meniere disease    Neuromuscular disorder (HCC)    raynaud's   Osteopenia    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HCC)    Stroke (HCC) 1988   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 carotid artery  dissection    Tubular adenoma of colon 02/2013   Varicose veins with inflammation    upper and lower per pt    Vasculitis [facility])     Past Surgical History:  Procedure Laterality Date   arthroscopic knee  2009   left knee/ torn meniscus   BREAST LUMPECTOMY WITH RADIOACTIVE SEED LOCALIZATION Right 11/05/2020   Procedure: RIGHT BREAST LUMPECTOMY WITH RADIOACTIVE SEED LOCALIZATION;  Surgeon: Harriette Bouillon, MD;  Location: [facility];  Service: General;  Laterality: Right;   BUNIONECTOMY Right 1998   with other foot surgery    carotid artery disection  1988   Carotid Artery Dissection   CATARACT EXTRACTION, BILATERAL  07-18-2017,08-08-2017   CESAREAN SECTION  1988   1 time   COLONOSCOPY  2019   last 2019   DILATION AND CURETTAGE OF UTERUS  2004   POLYPECTOMY     POPLITEAL SYNOVIAL CYST EXCISION     left leg   TONSILLECTOMY  1957   UPPER GASTROINTESTINAL ENDOSCOPY     last 2018    Family History  Problem Relation Age of Onset   Multiple myeloma Father 67   Thyroid cancer Sister 55       s/p removal. papilary and anaplastic.    Lung cancer Sister    Kidney disease Brother        cancer- removed   Kidney cancer Brother        dx early 57s   Alzheimer's disease Brother    Stroke Brother 34   Breast cancer Cousin 42       paternal first cousin   Cancer Cousin        unknown type, paternal first cousin   Breast cancer Other        mother's first cousin   Cervical cancer Other    Colon cancer Neg Hx    Esophageal cancer Neg Hx    Rectal cancer Neg Hx    Stomach cancer Neg Hx    Colon polyps Neg Hx     Social History   Socioeconomic History   Marital status: Married    Spouse name: Not on file   Number of children: Not on file   Years of education: Not on file   Highest education level: Not on file  Occupational History   Occupation: retired  Tobacco Use   Smoking status: Never   Smokeless tobacco: Never  Substance and Sexual Activity   Alcohol  use: No    Alcohol/week: 0.0 standard drinks   Drug use: No   Sexual activity: Yes    Birth control/protection: Post-menopausal  Other Topics Concern   Not on file  Social History Narrative   Lives with husband who is also a patient of Dr. Durene Cal   Social Determinants of Health   Financial Resource Strain: Low Risk    Difficulty of Paying Living Expenses: Not hard at all  Food Insecurity: No Food Insecurity   Worried About Programme researcher, broadcasting/film/video in the Last Year: Never true   Ran Out of Food in the Last Year: Never true  Transportation Needs: No Transportation Needs   Lack of Transportation (Medical): No   Lack of Transportation (Non-Medical): No  Physical Activity: Sufficiently Active   Days of Exercise per Week: 6 days   Minutes of Exercise per Session: 50 min  Stress: No Stress Concern Present   Feeling of Stress : Not at all  Social Connections: Socially Integrated   Frequency of Communication with Friends and Family: More than three times a week   Frequency of Social Gatherings with Friends and Family: More than three times a week   Attends Religious Services: More than 4 times per year   Active Member of [employer] or Organizations: Yes   Attends Banker Meetings: 1 to 4 times per year   Marital Status: Married  Catering manager Violence: Not At Risk   Fear of Current or Ex-Partner: No   Emotionally Abused: No   Physically Abused: No   Sexually Abused: No    Outpatient Medications Prior to Visit  Medication Sig Dispense Refill   amLODipine (NORVASC) 2.5 MG tablet TAKE 1 TABLET BY MOUTH EVERY DAY 90 tablet 2   aspirin 81 MG tablet Take 81 mg by mouth daily. Evening     fenofibrate 160 MG tablet TAKE 1 TABLET BY MOUTH EVERY DAY 90 tablet 1   loratadine (CLARITIN) 10 MG tablet Take 10 mg by mouth daily. Take 1/2 tablet daily     meclizine (ANTIVERT) 12.5 MG tablet Take 1 tablet (12.5 mg total)  by mouth 3 (three) times daily as needed (off balance). (Patient not  taking: No sig reported) 30 tablet 0   metoprolol tartrate (LOPRESSOR) 50 MG tablet TAKE 1/2 TABLET BY MOUTH 2 TIMES DAILY 90 tablet 1   montelukast (SINGULAIR) 10 MG tablet TAKE 1 TABLET BY MOUTH AT BEDTIME 90 tablet 1   pantoprazole (PROTONIX) 40 MG tablet TAKE 1 TABLET BY MOUTH EVERY DAY 90 tablet 1   traMADol (ULTRAM) 50 MG tablet Take 1 tablet (50 mg total) by mouth every 6 (six) hours as needed. 15 tablet 0   Vitamin D, Cholecalciferol, 1000 units TABS Take by mouth daily. Morning     No facility-administered medications prior to visit.    Allergies  Allergen Reactions   Hydrocodone-Acetaminophen     VOMITING   Azithromycin Other (See Comments)    Thrush    Neomycin    Penicillins     REACTION: Arm swelling   Pneumococcal Vaccine Polyvalent     ARM REDNESS WITH TENDERNESS   Pneumovax [Pneumococcal Polysaccharide Vaccine]     Review of Systems REFER TO HPI FOR PERTINENT POSITIVES AND NEGATIVES     Objective:    Physical Exam Vitals and nursing note reviewed.  Constitutional:      Appearance: Normal appearance. She is normal weight. She is not toxic-appearing.  HENT:     Head: Normocephalic and atraumatic.     Right Ear: Ear canal and external ear normal.     Left Ear: Ear canal and external ear normal.     Nose: Nose normal.     Mouth/Throat:     Mouth: Mucous membranes are moist.  Eyes:     Extraocular Movements: Extraocular movements intact.     Conjunctiva/sclera: Conjunctivae normal.     Pupils: Pupils are equal, round, and reactive to light.  Cardiovascular:     Rate and Rhythm: Normal rate and regular rhythm.     Pulses: Normal pulses.     Heart sounds: Normal heart sounds.  Pulmonary:     Effort: Pulmonary effort is normal.     Breath sounds: Normal breath sounds.  Abdominal:     Palpations: Abdomen is soft.  Musculoskeletal:        General: Normal range of motion.     Cervical back: Normal range of motion and neck supple.  Skin:    General: Skin  is warm and dry.  Neurological:     General: No focal deficit present.     Mental Status: She is alert and oriented to person, place, and time.  Psychiatric:        Mood and Affect: Mood normal.        Behavior: Behavior normal.        Thought Content: Thought content normal.        Judgment: Judgment normal.    BP (!) 155/78   Pulse 68   Temp (!) 97.4 F (36.3 C)   Ht 5\' 5"  (1.651 m)   Wt 118 lb 3.2 oz (53.6 kg)   SpO2 97%   BMI 19.67 kg/m  Wt Readings from Last 3 Encounters:  11/07/20 118 lb 3.2 oz (53.6 kg)  11/05/20 116 lb 2.9 oz (52.7 kg)  10/22/20 118 lb (53.5 kg)    Health Maintenance Due  Topic Date Due   COVID-19 Vaccine (4 - Booster for Pfizer series) 05/07/2020   INFLUENZA VACCINE  10/20/2020    There are no preventive care reminders to display for this patient.  Lab Results  Component Value Date   TSH 1.97 07/12/2017   Lab Results  Component Value Date   WBC 4.5 10/22/2020   HGB 11.9 (L) 10/22/2020   HCT 36.4 10/22/2020   MCV 86.9 10/22/2020   PLT 313 10/22/2020   Lab Results  Component Value Date   NA 142 10/22/2020   K 3.8 10/22/2020   CO2 24 10/22/2020   GLUCOSE 133 (H) 10/22/2020   BUN 27 (H) 10/22/2020   CREATININE 0.75 10/22/2020   BILITOT 0.4 10/22/2020   ALKPHOS 43 10/22/2020   AST 20 10/22/2020   ALT 15 10/22/2020   PROT 7.5 10/22/2020   ALBUMIN 3.9 10/22/2020   CALCIUM 9.9 10/22/2020   ANIONGAP 9 10/22/2020   GFR 85.04 06/26/2020   Lab Results  Component Value Date   CHOL 134 12/27/2019   Lab Results  Component Value Date   HDL 61 12/27/2019   Lab Results  Component Value Date   LDLCALC 63 12/27/2019   Lab Results  Component Value Date   TRIG 36 12/27/2019   Lab Results  Component Value Date   CHOLHDL 2.2 12/27/2019   Lab Results  Component Value Date   HGBA1C 6.2 06/26/2020       Assessment & Plan:   Problem List Items Addressed This Visit   None  1. Horner's syndrome No new changes, pt states she  wanted it recorded in chart today that she is having a flare-up right now but managing with Tylenol.  2. Nausea Not currently having any issues. Rx for Zofran ODT when this does flare up.  3. Primary hypertension Elevated today. She is going to monitor at home. Most likely 2/2 pain from recent lumpectomy. She will call if problems and cont on current regimen.    Kirke Breach M Misha Vanoverbeke, PA-C

## 2020-11-07 NOTE — Patient Instructions (Signed)
Continue Tylenol for pain as needed. Follow up with your eye doctor as scheduled. Monitor BP at home and call if continues to stay elevated. I suspect it is up due to pain right now. Zofran ODT for nausea as needed.  Call if any other concerns. Good to meet you.

## 2020-11-10 ENCOUNTER — Encounter: Payer: Self-pay | Admitting: Genetic Counselor

## 2020-11-10 ENCOUNTER — Encounter: Payer: Self-pay | Admitting: Surgery

## 2020-11-10 ENCOUNTER — Telehealth: Payer: Self-pay | Admitting: Genetic Counselor

## 2020-11-10 ENCOUNTER — Ambulatory Visit: Payer: Self-pay | Admitting: Genetic Counselor

## 2020-11-10 DIAGNOSIS — Z1379 Encounter for other screening for genetic and chromosomal anomalies: Secondary | ICD-10-CM | POA: Insufficient documentation

## 2020-11-10 LAB — SURGICAL PATHOLOGY

## 2020-11-10 NOTE — Telephone Encounter (Signed)
Revealed negative genetic testing.  Discussed that we do not know why she has breast cancer or why there is cancer in the family. It could be sporadic, to a different gene that we are not testing, or maybe our current technology may not be able to pick something up.  She may wish to keep in contact with genetics to keep up with whether additional testing may be needed.

## 2020-11-11 ENCOUNTER — Encounter: Payer: Self-pay | Admitting: *Deleted

## 2020-11-12 ENCOUNTER — Telehealth: Payer: Self-pay

## 2020-11-12 NOTE — Telephone Encounter (Signed)
Pt called in stating that her blood pressure has been running high and she has bad acid reflux. She stated that she saw Alyssa last week and her blood pressure was high at the appt. She stated that the last reading that she did was 150/55. I scheduled pt with Alyssa tomorrow. Pt will bring blood pressure log.

## 2020-11-13 ENCOUNTER — Encounter: Payer: Self-pay | Admitting: Physician Assistant

## 2020-11-13 ENCOUNTER — Other Ambulatory Visit: Payer: Self-pay

## 2020-11-13 ENCOUNTER — Ambulatory Visit (INDEPENDENT_AMBULATORY_CARE_PROVIDER_SITE_OTHER): Payer: Medicare Other | Admitting: Physician Assistant

## 2020-11-13 VITALS — BP 160/78 | HR 81 | Temp 97.8°F | Wt 113.2 lb

## 2020-11-13 DIAGNOSIS — F411 Generalized anxiety disorder: Secondary | ICD-10-CM

## 2020-11-13 DIAGNOSIS — I10 Essential (primary) hypertension: Secondary | ICD-10-CM | POA: Diagnosis not present

## 2020-11-13 DIAGNOSIS — K219 Gastro-esophageal reflux disease without esophagitis: Secondary | ICD-10-CM

## 2020-11-13 MED ORDER — FAMOTIDINE 10 MG PO TABS: 10.0000 mg | ORAL_TABLET | Freq: Two times a day (BID) | ORAL | 0 refills | Status: DC

## 2020-11-13 MED ORDER — CLONAZEPAM 0.5 MG PO TABS: 0.2500 mg | ORAL_TABLET | Freq: Every day | ORAL | 0 refills | Status: DC

## 2020-11-13 NOTE — Patient Instructions (Addendum)
Good to see you again today! I think a lot of your symptoms are being exacerbate by anxiety right now.  Start on 1/2 tablet Klonopin once daily and see if this helps take the edge off of the anxiety. Caution with use! Do not drive with this medication. Be very cautious about falls. See handout for tips to help with anxiety.  Also, will start on Famotidine twice daily in addition to your Protonix.  Eat small, frequent meals, high in protein. Drink plenty of water. If still no improvement, recheck with GI.  Only check your blood pressure 3 days out of the week, one time in the morning with feet flat on the floor. Bring log to next appointment.   Call or Mychart sooner if you have any concerns.

## 2020-11-13 NOTE — Progress Notes (Signed)
Acute Office Visit  Subjective:    Patient ID: Lori [name], female    DOB: [birth year], 80 y.o.   MRN: [id number]  Chief Complaint  Patient presents with   Hypertension   Gastroesophageal Reflux    Surgery on 8/17, since then worsening GERD - down 5 lbs    Hypertension  Gastroesophageal Reflux  Patient is in today for recheck blood pressure and worsening GERD. She is very worried about these issues, as well as her weight loss, and recent lumpectomy and Horner's flare up. She is all over the place today. She admits that she has been very anxious. States her husband and child have noted this worsening as well. States she has been sleeping in a recliner, but she has been pacing the floors with anxiety a lot.  She takes Protonix 40 mg daily, but states this doesn't seem to be helping.  Hx hiatal hernia and Barrett's esophagus. Drinks [place] water. Yesterday could only eat cream of wheat.  BP readings log with her from home: 141/64 on 11/08/20 118/44 & then 115/64 on 11/12/20  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    Eczema    Family history of breast cancer    Family history of kidney cancer    Family history of multiple myeloma    Family history of thyroid cancer    GERD (gastroesophageal reflux disease)    Headache(784.0)    History of IBS    History of kidney stones    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    Hyperlipidemia    on medication   Hypertension    Low back pain    Meniere disease    Neuromuscular disorder (HCC)    raynaud's    Osteopenia    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HCC)    Stroke (HCC) 1988   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 carotid artery dissection    Tubular adenoma of colon 02/2013   Varicose veins with inflammation    upper and lower per pt    Vasculitis [facility])     Past Surgical History:  Procedure Laterality Date   arthroscopic knee  2009   left knee/ torn meniscus   BREAST LUMPECTOMY WITH RADIOACTIVE SEED LOCALIZATION Right 11/05/2020   Procedure: RIGHT BREAST LUMPECTOMY WITH RADIOACTIVE SEED LOCALIZATION;  Surgeon: Harriette Bouillon, MD;  Location: [facility];  Service: General;  Laterality: Right;   BUNIONECTOMY Right 1998   with other foot surgery    carotid artery disection  1988   Carotid Artery Dissection   CATARACT EXTRACTION, BILATERAL  07-18-2017,08-08-2017   CESAREAN SECTION  1988   1 time   COLONOSCOPY  2019   last 2019   DILATION AND CURETTAGE OF UTERUS  2004   POLYPECTOMY     POPLITEAL SYNOVIAL CYST EXCISION     left leg   TONSILLECTOMY  1957  UPPER GASTROINTESTINAL ENDOSCOPY     last 2018    Family History  Problem Relation Age of Onset   Multiple myeloma Father 70   Thyroid cancer Sister 65       s/p removal. papilary and anaplastic.    Lung cancer Sister    Kidney disease Brother        cancer- removed   Kidney cancer Brother        dx early 32s   Alzheimer's disease Brother    Stroke Brother 8   Breast cancer Cousin 82       paternal first cousin   Cancer Cousin        unknown type, paternal first cousin   Breast cancer Other        mother's first cousin   Cervical cancer Other    Colon cancer Neg Hx    Esophageal cancer Neg Hx    Rectal cancer Neg Hx    Stomach cancer Neg Hx    Colon polyps Neg Hx     Social History   Socioeconomic History   Marital status: Married    Spouse name: Not on file   Number of children: Not on file   Years of education: Not on file   Highest education level: Not on file  Occupational History   Occupation: retired   Tobacco Use   Smoking status: Never   Smokeless tobacco: Never  Substance and Sexual Activity   Alcohol use: No    Alcohol/week: 0.0 standard drinks   Drug use: No   Sexual activity: Yes    Birth control/protection: Post-menopausal  Other Topics Concern   Not on file  Social History Narrative   Lives with husband who is also a patient of Dr. Durene Cal   Social Determinants of Health   Financial Resource Strain: Low Risk    Difficulty of Paying Living Expenses: Not hard at all  Food Insecurity: No Food Insecurity   Worried About Programme researcher, broadcasting/film/video in the Last Year: Never true   Ran Out of Food in the Last Year: Never true  Transportation Needs: No Transportation Needs   Lack of Transportation (Medical): No   Lack of Transportation (Non-Medical): No  Physical Activity: Sufficiently Active   Days of Exercise per Week: 6 days   Minutes of Exercise per Session: 50 min  Stress: No Stress Concern Present   Feeling of Stress : Not at all  Social Connections: Socially Integrated   Frequency of Communication with Friends and Family: More than three times a week   Frequency of Social Gatherings with Friends and Family: More than three times a week   Attends Religious Services: More than 4 times per year   Active Member of [employer] or Organizations: Yes   Attends Banker Meetings: 1 to 4 times per year   Marital Status: Married  Catering manager Violence: Not At Risk   Fear of Current or Ex-Partner: No   Emotionally Abused: No   Physically Abused: No   Sexually Abused: No    Outpatient Medications Prior to Visit  Medication Sig Dispense Refill   amLODipine (NORVASC) 2.5 MG tablet TAKE 1 TABLET BY MOUTH EVERY DAY 90 tablet 2   aspirin 81 MG tablet Take 81 mg by mouth daily. Evening     fenofibrate 160 MG tablet TAKE 1 TABLET BY MOUTH EVERY DAY 90 tablet 1   loratadine (CLARITIN) 10 MG tablet Take 10 mg by mouth daily. Take 1/2 tablet daily  meclizine (ANTIVERT) 12.5  MG tablet Take 1 tablet (12.5 mg total) by mouth 3 (three) times daily as needed (off balance). 30 tablet 0   metoprolol tartrate (LOPRESSOR) 50 MG tablet TAKE 1/2 TABLET BY MOUTH 2 TIMES DAILY 90 tablet 1   montelukast (SINGULAIR) 10 MG tablet TAKE 1 TABLET BY MOUTH AT BEDTIME 90 tablet 1   ondansetron (ZOFRAN-ODT) 4 MG disintegrating tablet Take 1 tablet (4 mg total) by mouth every 8 (eight) hours as needed for nausea or vomiting. 15 tablet 0   pantoprazole (PROTONIX) 40 MG tablet TAKE 1 TABLET BY MOUTH EVERY DAY 90 tablet 1   traMADol (ULTRAM) 50 MG tablet Take 1 tablet (50 mg total) by mouth every 6 (six) hours as needed. 15 tablet 0   Vitamin D, Cholecalciferol, 1000 units TABS Take by mouth daily. Morning     No facility-administered medications prior to visit.    Allergies  Allergen Reactions   Hydrocodone-Acetaminophen     VOMITING   Azithromycin Other (See Comments)    Thrush    Neomycin    Penicillins     REACTION: Arm swelling   Pneumococcal Vaccine Polyvalent     ARM REDNESS WITH TENDERNESS   Pneumovax [Pneumococcal Polysaccharide Vaccine]     Review of Systems REFER TO HPI FOR PERTINENT POSITIVES AND NEGATIVES     Objective:    Physical Exam Vitals and nursing note reviewed.  Constitutional:      Appearance: Normal appearance. She is normal weight. She is not toxic-appearing.  HENT:     Head: Normocephalic and atraumatic.     Right Ear: Ear canal and external ear normal.     Left Ear: Ear canal and external ear normal.     Nose: Nose normal.     Mouth/Throat:     Mouth: Mucous membranes are moist.  Eyes:     Extraocular Movements: Extraocular movements intact.     Conjunctiva/sclera: Conjunctivae normal.     Pupils: Pupils are equal, round, and reactive to light.  Cardiovascular:     Rate and Rhythm: Normal rate and regular rhythm.     Pulses: Normal pulses.     Heart sounds: Normal heart sounds.  Pulmonary:     Effort: Pulmonary effort is normal.      Breath sounds: Normal breath sounds.  Abdominal:     Palpations: Abdomen is soft.  Musculoskeletal:        General: Normal range of motion.     Cervical back: Normal range of motion and neck supple.  Skin:    General: Skin is warm and dry.  Neurological:     General: No focal deficit present.     Mental Status: She is alert and oriented to person, place, and time.  Psychiatric:     Comments: She is very anxious today, visibly shaking at times, talking a mile a minute, and tearful. Flight of ideas.     BP (!) 160/78   Pulse 81   Temp 97.8 F (36.6 C) (Temporal)   Wt 113 lb 3.2 oz (51.3 kg)   SpO2 97%   BMI 18.84 kg/m  Wt Readings from Last 3 Encounters:  11/13/20 113 lb 3.2 oz (51.3 kg)  11/07/20 118 lb 3.2 oz (53.6 kg)  11/05/20 116 lb 2.9 oz (52.7 kg)    Health Maintenance Due  Topic Date Due   COVID-19 Vaccine (4 - Booster for Pfizer series) 05/07/2020   INFLUENZA VACCINE  10/20/2020    There are no  preventive care reminders to display for this patient.   Lab Results  Component Value Date   TSH 1.97 07/12/2017   Lab Results  Component Value Date   WBC 4.5 10/22/2020   HGB 11.9 (L) 10/22/2020   HCT 36.4 10/22/2020   MCV 86.9 10/22/2020   PLT 313 10/22/2020   Lab Results  Component Value Date   NA 142 10/22/2020   K 3.8 10/22/2020   CO2 24 10/22/2020   GLUCOSE 133 (H) 10/22/2020   BUN 27 (H) 10/22/2020   CREATININE 0.75 10/22/2020   BILITOT 0.4 10/22/2020   ALKPHOS 43 10/22/2020   AST 20 10/22/2020   ALT 15 10/22/2020   PROT 7.5 10/22/2020   ALBUMIN 3.9 10/22/2020   CALCIUM 9.9 10/22/2020   ANIONGAP 9 10/22/2020   GFR 85.04 06/26/2020   Lab Results  Component Value Date   CHOL 134 12/27/2019   Lab Results  Component Value Date   HDL 61 12/27/2019   Lab Results  Component Value Date   LDLCALC 63 12/27/2019   Lab Results  Component Value Date   TRIG 36 12/27/2019   Lab Results  Component Value Date   CHOLHDL 2.2 12/27/2019   Lab  Results  Component Value Date   HGBA1C 6.2 06/26/2020       Assessment & Plan:   Problem List Items Addressed This Visit       Cardiovascular and Mediastinum   Hypertension - Primary     Digestive   GERD (gastroesophageal reflux disease)   Relevant Medications   famotidine (PEPCID) 10 MG tablet   Other Visit Diagnoses     GAD (generalized anxiety disorder)            Meds ordered this encounter  Medications   famotidine (PEPCID) 10 MG tablet    Sig: Take 1 tablet (10 mg total) by mouth 2 (two) times daily.    Dispense:  60 tablet    Refill:  0   clonazePAM (KLONOPIN) 0.5 MG tablet    Sig: Take 0.5 tablets (0.25 mg total) by mouth daily.    Dispense:  15 tablet    Refill:  0   1. Primary hypertension Her blood pressure is elevated again today.  However, she is obviously very worked up today.  Her readings at home that she brought with her have been okay.  I am going to have her to continue Norvasc 2.5 mg and the Lopressor 50 mg daily at this time.  I also want her to cut back on how often she is checking her blood pressure, as I do think this is contributing to her anxiety as well.  Recommended that she do only 2 to 3 days/week and then bring this check with her to the next appointment.  2. Gastroesophageal reflux disease without esophagitis This is worsening despite Protonix 40 mg.  Again, her anxiety and recent stress is definitely contributing to this.  I am going to add famotidine 10 mg twice daily for her to take in addition to the Protonix.  I advised her to try small frequent meals.  She also knows to do a low acid diet and avoid foods that worsen her GERD symptoms.  If we still cannot get this under control, she does need to see her GI about this again.  3. GAD (generalized anxiety disorder) Discussed with Dr. Durene Cal.  She is very worked up and visibly anxious.  We will start on clonazepam 0.25 mg once daily to see if  this can help take the edge off for her.   Advised her not to drive after use of this medication.  Strongly cautioned about falls.  We will have close follow-up to see how she is doing with everything and she knows to call if she has any questions or concerns.  AVS printed for her to help with anxiety reducing techniques as well.  This note was prepared with assistance of Conservation officer, historic buildings. Occasional wrong-word or sound-a-like substitutions may have occurred due to the inherent limitations of voice recognition software.   Arav Bannister M Javeria Briski, PA-C

## 2020-11-14 ENCOUNTER — Other Ambulatory Visit: Payer: Self-pay | Admitting: Family Medicine

## 2020-11-16 NOTE — Progress Notes (Signed)
Patient Care Team: Shelva Majestic, MD as PCP - General (Family Medicine) Christia Reading, MD as Consulting Physician (Otolaryngology) Meryl Dare, MD as Consulting Physician (Gastroenterology) Aris Lot, MD as Consulting Physician (Dermatology) Pershing Proud, RN as Oncology Nurse Navigator Rogelia Boga, Eileen Stanford, RN as Oncology Nurse Navigator Cornett, Maisie Fus, MD as Consulting Physician (General Surgery) Serena Croissant, MD as Consulting Physician (Hematology and Oncology) Dorothy Puffer, MD as Consulting Physician (Radiation Oncology)  DIAGNOSIS:    ICD-10-CM   1. Malignant neoplasm of lower-inner quadrant of right breast of female, estrogen receptor positive (HCC)  C50.311    Z17.0       SUMMARY OF ONCOLOGIC HISTORY: Oncology History  Malignant neoplasm of lower-inner quadrant of right breast of female, estrogen receptor positive (HCC)  10/13/2020 Initial Diagnosis   Screening mammogram showed indeterminate mass in the right breast. Diagnostic mammogram and US showed 1 cm x 1.3 cm suspicious irregular mass at 4:00 4 cm from the nipple. 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   10/22/2020 Cancer Staging   Staging form: Breast, AJCC 8th Edition - Clinical stage from 10/22/2020: Stage IA (cT1b, cN0, cM0, G2, ER+, PR+, HER2-) - Signed by Serena Croissant, MD on 10/22/2020 Stage prefix: Initial diagnosis Histologic grading system: 3 grade system   11/05/2020 Genetic Testing   Negative hereditary cancer genetic testing: no pathogenic variants detected in [facility] CancerNext-Expanded +RNAinsight Panel.  The report date is November 05, 2020.    The CancerNext-Expanded gene panel offered by [facility] and includes sequencing, rearrangement, and RNA analysis for the following 77 genes: AIP, ALK, APC, ATM, AXIN2, BAP1, BARD1, BLM, BMPR1A, BRCA1, BRCA2, BRIP1, CDC73, CDH1, CDK4, CDKN1B, CDKN2A, CHEK2, CTNNA1, DICER1, FANCC, FH, FLCN, GALNT12, KIF1B, LZTR1,  MAX, MEN1, MET, MLH1, MSH2, MSH3, MSH6, MUTYH, NBN, NF1, NF2, NTHL1, PALB2, PHOX2B, PMS2, POT1, PRKAR1A, PTCH1, PTEN, RAD51C, RAD51D, RB1, RECQL, RET, SDHA, SDHAF2, SDHB, SDHC, SDHD, SMAD4, SMARCA4, SMARCB1, SMARCE1, STK11, SUFU, TMEM127, TP53, TSC1, TSC2, VHL and XRCC2 (sequencing and deletion/duplication); EGFR, EGLN1, HOXB13, KIT, MITF, PDGFRA, POLD1, and POLE (sequencing only); EPCAM and GREM1 (deletion/duplication only).    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 with above-mentioned history of right breast cancer.  She underwent a right lumpectomy on 11/05/20 with Dr. 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 She presents to the clinic today for follow-up.  She has recovered from recent surgery with mild pain and discomfort but does not require any strong pain medication.  ALLERGIES:  is allergic to hydrocodone-acetaminophen, azithromycin, neomycin, penicillins, pneumococcal vaccine polyvalent, and pneumovax [pneumococcal polysaccharide vaccine].  MEDICATIONS:  Current Outpatient Medications  Medication Sig Dispense Refill   amLODipine (NORVASC) 2.5 MG tablet TAKE 1 TABLET BY MOUTH EVERY DAY 90 tablet 2   aspirin 81 MG tablet Take 81 mg by mouth daily. Evening     clonazePAM (KLONOPIN) 0.5 MG tablet Take 0.5 tablets (0.25 mg total) by mouth daily. 15 tablet 0   famotidine (PEPCID) 10 MG tablet Take 1 tablet (10 mg total) by mouth 2 (two) times daily. 60 tablet 0   fenofibrate 160 MG tablet TAKE 1 TABLET BY MOUTH EVERY DAY 90 tablet 1   loratadine (CLARITIN) 10 MG tablet Take 10 mg by mouth daily. Take 1/2  tablet daily     meclizine (ANTIVERT) 12.5 MG tablet Take 1 tablet (12.5 mg total) by mouth 3 (three) times daily as needed (off balance). 30 tablet 0    metoprolol tartrate (LOPRESSOR) 50 MG tablet TAKE 1/2 TABLET BY MOUTH 2 TIMES DAILY 90 tablet 1   montelukast (SINGULAIR) 10 MG tablet TAKE 1 TABLET BY MOUTH AT BEDTIME 90 tablet 1   ondansetron (ZOFRAN-ODT) 4 MG disintegrating tablet Take 1 tablet (4 mg total) by mouth every 8 (eight) hours as needed for nausea or vomiting. 15 tablet 0   pantoprazole (PROTONIX) 40 MG tablet TAKE 1 TABLET BY MOUTH EVERY DAY 90 tablet 1   traMADol (ULTRAM) 50 MG tablet Take 1 tablet (50 mg total) by mouth every 6 (six) hours as needed. 15 tablet 0   Vitamin D, Cholecalciferol, 1000 units TABS Take by mouth daily. Morning     No current facility-administered medications for this visit.    PHYSICAL EXAMINATION: ECOG PERFORMANCE STATUS: 1 - Symptomatic but completely ambulatory  Vitals:   11/17/20 1504  BP: (!) 172/60  Pulse: 75  Resp: 17  Temp: 97.8 F (36.6 C)  SpO2: 98%   Filed Weights   11/17/20 1504  Weight: 116 lb 3.2 oz (52.7 kg)    BREAST: No palpable masses or nodules in either right or left breasts. No palpable axillary supraclavicular or infraclavicular adenopathy no breast tenderness or nipple discharge. (exam performed in the presence of a chaperone)  LABORATORY DATA:  I have reviewed the data as listed CMP Latest Ref Rng & Units 10/22/2020 06/26/2020 01/18/2020  Glucose 70 - 99 mg/dL 133(H) 108(H) 129(H)  BUN 8 - 23 mg/dL 27(H) 24(H) 24(H)  Creatinine 0.44 - 1.00 mg/dL 0.75 0.63 0.73  Sodium 135 - 145 mmol/L 142 140 141  Potassium 3.5 - 5.1 mmol/L 3.8 4.2 3.6  Chloride 98 - 111 mmol/L 109 107 104  CO2 22 - 32 mmol/L $RemoveB'24 26 26  'fyzbQywD$ Calcium 8.9 - 10.3 mg/dL 9.9 9.4 9.9  Total Protein 6.5 - 8.1 g/dL 7.5 6.8 -  Total Bilirubin 0.3 - 1.2 mg/dL 0.4 0.4 -  Alkaline Phos 38 - 126 U/L 43 40 -  AST 15 - 41 U/L 20 19 -  ALT 0 - 44 U/L 15 14 -    Lab Results  Component Value Date   WBC 4.5 10/22/2020   HGB 11.9 (L) 10/22/2020   HCT 36.4 10/22/2020   MCV 86.9 10/22/2020   PLT 313 10/22/2020    NEUTROABS 2.4 10/22/2020    ASSESSMENT & PLAN:  Malignant neoplasm of lower-inner quadrant of right breast of female, estrogen receptor positive ([place]) 10/13/2020:Screening mammogram showed indeterminate mass in the right breast. Diagnostic mammogram and US showed 1 cm x 1.3 cm suspicious irregular mass at 4:00 4 cm from the nipple. 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 I discussed the margins as well as lymph node surgeries. We also discussed the final staging along with previously performed ER/PR and HER-2/neu testing.  Treatment plan: 1.  Based on negative margins, we decided that she does not need adjuvant radiation therapy  2. Adjuvant antiestrogen therapy with letrozole once daily x5 years We discussed the pros and cons of letrozole once again. She will try to take  letrozole and see if she can tolerate it.  If she tolerates it well then she will continue on further.  Return to clinic in 3 months for survivorship care plan visit with Mendel Ryder.  After that I can see her in 6 months and after that annually.   No orders of the defined types were placed in this encounter.  The patient has a good understanding of the overall plan. she agrees with it. she will call with any problems that may develop before the next visit here.  Total time spent: 30 mins including face to face time and time spent for planning, charting and coordination of care  Rulon Eisenmenger, MD, MPH 11/17/2020  I, Thana Ates, am acting as scribe for Dr. Nicholas Lose.  I have reviewed the above documentation for accuracy and completeness, and I agree with the above.

## 2020-11-17 ENCOUNTER — Encounter: Payer: Self-pay | Admitting: *Deleted

## 2020-11-17 ENCOUNTER — Inpatient Hospital Stay: Payer: Medicare Other | Admitting: Hematology and Oncology

## 2020-11-17 ENCOUNTER — Other Ambulatory Visit: Payer: Self-pay

## 2020-11-17 DIAGNOSIS — Z170 Estrogen receptor positive status [ER+]: Secondary | ICD-10-CM | POA: Diagnosis not present

## 2020-11-17 DIAGNOSIS — C50311 Malignant neoplasm of lower-inner quadrant of right female breast: Secondary | ICD-10-CM

## 2020-11-17 DIAGNOSIS — Z79899 Other long term (current) drug therapy: Secondary | ICD-10-CM | POA: Diagnosis not present

## 2020-11-17 DIAGNOSIS — I700 Atherosclerosis of aorta: Secondary | ICD-10-CM | POA: Diagnosis not present

## 2020-11-17 DIAGNOSIS — M810 Age-related osteoporosis without current pathological fracture: Secondary | ICD-10-CM | POA: Diagnosis not present

## 2020-11-17 DIAGNOSIS — I729 Aneurysm of unspecified site: Secondary | ICD-10-CM | POA: Diagnosis not present

## 2020-11-17 DIAGNOSIS — I10 Essential (primary) hypertension: Secondary | ICD-10-CM | POA: Diagnosis not present

## 2020-11-17 DIAGNOSIS — M545 Low back pain, unspecified: Secondary | ICD-10-CM | POA: Diagnosis not present

## 2020-11-17 DIAGNOSIS — K219 Gastro-esophageal reflux disease without esophagitis: Secondary | ICD-10-CM | POA: Diagnosis not present

## 2020-11-17 DIAGNOSIS — K2270 Barrett's esophagus without dysplasia: Secondary | ICD-10-CM | POA: Diagnosis not present

## 2020-11-17 DIAGNOSIS — E785 Hyperlipidemia, unspecified: Secondary | ICD-10-CM | POA: Diagnosis not present

## 2020-11-17 MED ORDER — LETROZOLE 2.5 MG PO TABS: 2.5000 mg | ORAL_TABLET | Freq: Every day | ORAL | 3 refills | Status: DC

## 2020-11-17 NOTE — Assessment & Plan Note (Signed)
10/13/2020:Screening mammogram showed indeterminate mass in the right breast. Diagnostic mammogram and US showed 1 cm x 1.3 cm suspicious irregular mass at 4:00 4 cm from the nipple. 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 I discussed the margins as well as lymph node surgeries. We also discussed the final staging along with previously performed ER/PR and HER-2/neu testing.  Treatment plan: 1. +/- Adjuvant radiation therapy  2. Adjuvant antiestrogen therapy with letrozole once daily x5 years

## 2020-11-18 NOTE — Progress Notes (Signed)
HPI:  Lori [name] was previously seen in the [facility] due to a personal and family history of cancer and concerns regarding a hereditary predisposition to cancer. Please refer to our prior cancer genetics clinic note for more information regarding our discussion, assessment and recommendations, at the time. Lori [name] recent genetic test results were disclosed to her, as were recommendations warranted by these results. These results and recommendations are discussed in more detail below.  CANCER HISTORY:  Oncology History  Malignant neoplasm of lower-inner quadrant of right breast of female, estrogen receptor positive ([place])  10/13/2020 Initial Diagnosis   Screening mammogram showed indeterminate mass in the right breast. Diagnostic mammogram and US showed 1 cm x 1.3 cm suspicious irregular mass at 4:00 4 cm from the nipple. 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   10/22/2020 Cancer Staging   Staging form: Breast, AJCC 8th Edition - Clinical stage from 10/22/2020: Stage IA (cT1b, cN0, cM0, G2, ER+, PR+, HER2-) - Signed by Nicholas Lose, MD on 10/22/2020 Stage prefix: Initial diagnosis Histologic grading system: 3 grade system   11/05/2020 Genetic Testing   Negative hereditary cancer genetic testing: no pathogenic variants detected in [facility] CancerNext-Expanded +RNAinsight Panel.  The report date is November 05, 2020.    The CancerNext-Expanded gene panel offered by [facility] and includes sequencing, rearrangement, and RNA analysis for the following 77 genes: AIP, ALK, APC, ATM, AXIN2, BAP1, BARD1, BLM, BMPR1A, BRCA1, BRCA2, BRIP1, CDC73, CDH1, CDK4, CDKN1B, CDKN2A, CHEK2, CTNNA1, DICER1, FANCC, FH, FLCN, GALNT12, KIF1B, LZTR1, MAX, MEN1, MET, MLH1, MSH2, MSH3, MSH6, MUTYH, NBN, NF1, NF2, NTHL1, PALB2, PHOX2B, PMS2, POT1, PRKAR1A, PTCH1, PTEN, RAD51C, RAD51D, RB1, RECQL, RET, SDHA, SDHAF2, SDHB, SDHC, SDHD, SMAD4, SMARCA4, SMARCB1, SMARCE1,  STK11, SUFU, TMEM127, TP53, TSC1, TSC2, VHL and XRCC2 (sequencing and deletion/duplication); EGFR, EGLN1, HOXB13, KIT, MITF, PDGFRA, POLD1, and POLE (sequencing only); EPCAM and GREM1 (deletion/duplication only).    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  Significant diagnoses are listed below: Family History  Problem Relation Age of Onset   Multiple myeloma Father 9   Thyroid cancer Sister 50       s/p removal. papilary and anaplastic.    Lung cancer Sister    Kidney disease Brother        cancer- removed   Kidney cancer Brother        dx early 30s   Alzheimer's disease Brother    Stroke Brother 7   Breast cancer Cousin 73       paternal first cousin   Cancer Cousin        unknown type, paternal first cousin   Breast cancer Other        mother's first cousin   Cervical cancer Other    Colon cancer Neg Hx    Esophageal cancer Neg Hx    Rectal cancer Neg Hx    Stomach cancer Neg Hx    Colon polyps Neg Hx    Lori [name] has one daughter (age 34) and three sons (ages 87-54). She had two brothers and one sister. One brother had kidney cancer in his early 25s. Her sister had thyroid cancer (papillary and anaplastic) at age 7.   Lori [name] mother died at age 6 without cancer. There was one maternal aunt and three or  four maternal uncles. There is no known cancer among maternal aunts/uncles or maternal first cousins. Lori [name] maternal grandmother died at age 41 without cancer. Her maternal grandfather died older than 56 without cancer. One of her mother's cousins had breast cancer and cervical cancer, and this relative's son had cancer as well (unknown type).   Lori [name] father died at age 69 with multiple myeloma. There were three paternal aunts and three paternal uncles. There is no known cancer among  paternal aunts/uncles. One paternal cousin had breast cancer around age 69. Another cousin had cancer (unknown type). Lori [name] paternal grandmother died at age 77 without cancer. Her paternal grandfather died older than 14 without cancer.   Lori [name] is unaware of previous family history of genetic testing for hereditary cancer risks. Patient's maternal ancestors are of [place] and [place] descent, and paternal ancestors are of [place] descent. There is no reported Ashkenazi Jewish ancestry. There is no known consanguinity.  GENETIC TEST RESULTS: Genetic testing reported out on 11/05/2020 through the [facility] CancerNext-Expanded + RNAinsight panel. No pathogenic variants were detected.   The CancerNext-Expanded + RNAinsight gene panel offered by [facility] and includes sequencing and rearrangement analysis for the following 77 genes: AIP, ALK, APC, ATM, AXIN2, BAP1, BARD1, BLM, BMPR1A, BRCA1, BRCA2, BRIP1, CDC73, CDH1, CDK4, CDKN1B, CDKN2A, CHEK2, CTNNA1, DICER1, FANCC, FH, FLCN, GALNT12, KIF1B, LZTR1, MAX, MEN1, MET, MLH1, MSH2, MSH3, MSH6, MUTYH, NBN, NF1, NF2, NTHL1, PALB2, PHOX2B, PMS2, POT1, PRKAR1A, PTCH1, PTEN, RAD51C, RAD51D, RB1, RECQL, RET, SDHA, SDHAF2, SDHB, SDHC, SDHD, SMAD4, SMARCA4, SMARCB1, SMARCE1, STK11, SUFU, TMEM127, TP53, TSC1, TSC2, VHL and XRCC2 (sequencing and deletion/duplication); EGFR, EGLN1, HOXB13, KIT, MITF, PDGFRA, POLD1 and POLE (sequencing only); EPCAM and GREM1 (deletion/duplication only). RNA data is routinely analyzed for use in variant interpretation for all genes. The test report will be scanned into EPIC and located under the Molecular Pathology section of the Results Review tab.  A portion of the result report is included below for reference.     We discussed with Lori [name] that because current genetic testing is not perfect, it is possible there may be a gene mutation in one of these genes that current testing cannot detect, but that chance is small.  We  also discussed that there could be another gene that has not yet been discovered, or that we have not yet tested, that is responsible for the cancer diagnoses in the family. It is also possible there is a hereditary cause for the cancer in the family that Lori [name] did not inherit and therefore was not identified in her testing. Therefore, it is important to remain in touch with cancer genetics in the future so that we can continue to offer Lori [name] the most up to date genetic testing.   ADDITIONAL GENETIC TESTING: We discussed with Lori [name] that her genetic testing was fairly extensive.  If there are genes identified to increase cancer risk that can be analyzed in the future, we would be happy to discuss and coordinate this testing at that time.    CANCER SCREENING RECOMMENDATIONS: Lori [name] test result is considered negative (normal). This means that we have not identified a hereditary cause for her personal and family history of cancer at this time. Most cancers happen by chance and this negative test suggests that her personal and family history of cancer may fall into this category.    While reassuring, this does not definitively rule out a hereditary predisposition to cancer. It is still possible  that there could be genetic mutations that are undetectable by current technology. There could be genetic mutations in genes that have not been tested or identified to increase cancer risk.  Therefore, it is recommended she continue to follow the cancer management and screening guidelines provided by her oncology and primary healthcare provider.   An individual's cancer risk and medical management are not determined by genetic test results alone. Overall cancer risk assessment incorporates additional factors, including personal medical history, family history, and any available genetic information that may result in a personalized plan for cancer prevention and surveillance.  RECOMMENDATIONS FOR  FAMILY MEMBERS:  Individuals in this family might be at some increased risk of developing cancer, over the general population risk, simply due to the family history of cancer.  We recommended women in this family have a yearly mammogram beginning at age 84, or 19 years younger than the earliest onset of cancer, an annual clinical breast exam, and perform monthly breast self-exams. Women in this family should also have a gynecological exam as recommended by their primary provider. All family members should be referred for colonoscopy starting at age 75.  FOLLOW-UP: Lastly, we discussed with Lori [name] that cancer genetics is a rapidly advancing field and it is possible that new genetic tests will be appropriate for her and/or her family members in the future. We encouraged her to remain in contact with cancer genetics on an annual basis so we can update her personal and family histories and let her know of advances in cancer genetics that may benefit this family.   Our contact number was provided. Lori [name] questions were answered to her satisfaction, and she knows she is welcome to call us at anytime with additional questions or concerns.   Clint Guy, MS, [facility] Genetic Counselor [email or web address] Phone: [phone]

## 2020-11-26 ENCOUNTER — Other Ambulatory Visit: Payer: Self-pay | Admitting: Family Medicine

## 2020-11-26 NOTE — Progress Notes (Signed)
Phone [phone] In person visit   Subjective:   Lori [name] is a 80 y.o. year old very pleasant female patient who presents for/with See problem oriented charting Chief Complaint  Patient presents with   Follow-up    Elevated BP and GERD.  Pt brought the BP card for Dr. Yong Channel.    This visit occurred during the SARS-CoV-2 public health emergency.  Safety protocols were in place, including screening questions prior to the visit, additional usage of staff PPE, and extensive cleaning of exam room while observing appropriate contact time as indicated for disinfecting solutions.   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    Priority: Medium   Hypertension 11/29/2013    Priority: Medium   Hyperlipidemia 11/29/2013    Priority: Medium   Barrett's esophagus 08/24/2013    Priority: Medium   Allergic rhinitis 03/09/2007    Priority: Medium   Osteoporosis 09/22/2006    Priority: Medium   IBS (irritable bowel syndrome) 06/26/2019    Priority: Low   Aortic atherosclerosis ([place]) 02/11/2016    Priority: Low   Eczema 11/29/2013    Priority: Low   GERD (gastroesophageal reflux disease) 05/16/2013    Priority: Low   Vitamin D deficiency 01/29/2012    Priority: Low   Solitary pulmonary nodule 06/08/2011    Priority: Low   HIATAL HERNIA WITH REFLUX 02/06/2010    Priority: Low   DEGENERATIVE JOINT DISEASE, KNEE 03/14/2008    Priority: Low   VARICOSE VEINS LOWER EXTREMITIES W/INFLAMMATION 03/09/2007    Priority: Low   ACTINIC KERATOSIS, FOREHEAD, LEFT 03/09/2007    Priority: Low   Headache(784.0) 03/09/2007    Priority: Low   MENIERE'S DISEASE 09/22/2006    Priority: Low   RAYNAUD'S DISEASE 09/22/2006    Priority: Low   Genetic testing 11/10/2020   Family history of kidney cancer 10/22/2020    Family history of thyroid cancer 10/22/2020   Family history of multiple myeloma 10/22/2020   Family history of breast cancer 10/22/2020   Malignant neoplasm of lower-inner quadrant of right breast of female, estrogen receptor positive ([place]) 10/17/2020    Medications- reviewed and updated Current Outpatient Medications  Medication Sig Dispense Refill   amLODipine (NORVASC) 2.5 MG tablet TAKE 1 TABLET BY MOUTH EVERY DAY 90 tablet 2   aspirin 81 MG tablet Take 81 mg by mouth daily. Evening     famotidine (PEPCID) 10 MG tablet Take 1 tablet (10 mg total) by mouth 2 (two) times daily. 60 tablet 0   fenofibrate 160 MG tablet TAKE 1 TABLET BY MOUTH EVERY DAY 90 tablet 1   letrozole (FEMARA) 2.5 MG tablet Take 1 tablet (2.5 mg total) by mouth daily. 90 tablet 3   loratadine (CLARITIN) 10 MG tablet Take 10 mg by mouth daily. Take 1/2 tablet daily     meclizine (ANTIVERT) 12.5 MG tablet Take 1 tablet (12.5 mg total) by mouth 3 (three) times daily as needed (off balance). 30 tablet 0   metoprolol tartrate (LOPRESSOR) 50 MG tablet TAKE 1/2 TABLET BY MOUTH 2 TIMES DAILY 90 tablet 1   ondansetron (ZOFRAN-ODT) 4 MG disintegrating tablet Take 1 tablet (4 mg total) by mouth every 8 (eight) hours as needed for nausea or vomiting. 15 tablet 0   pantoprazole (PROTONIX) 40 MG tablet TAKE  1 TABLET BY MOUTH EVERY DAY 90 tablet 1   montelukast (SINGULAIR) 10 MG tablet TAKE 1 TABLET BY MOUTH AT BEDTIME (Patient taking differently: 10 mg.) 90 tablet 1   traMADol (ULTRAM) 50 MG tablet Take 1 tablet (50 mg total) by mouth every 6 (six) hours as needed. (Patient not taking: Reported on 12/01/2020) 15 tablet 0   Vitamin D, Cholecalciferol, 1000 units TABS Take by mouth daily. Morning     No current facility-administered medications for this visit.     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  Patient with right lumpectomy 11/05/2020 with negative margins thankfully.  As a result she does not require adjuvant radiation.  She will be on letrozole daily for 5 years.  She is following with Dr. Lindi Adie  # GERD S: Patient reported her GERD worsened and was concerned about her weight loss on 11/13/2020 with Alyssa Allwardt, PA-C. She admitted that she had been very anxious. She stated her husband and child had noted this worsened as well. She recently had been sleeping in a recliner but had been pacing the floors with anxiety a lot.  -She stated taking Protonix 40 Mg daily doesn't seem to help.   -History of hiatal hernia and Barrett's esophagus. Drinks [name] water and the day before last visit, she could only eat cream of wheat.   Medication: Pepcid 10 mg twice daily and Protonix 40 mg daily  Today, Patient states the symptoms improved on pepcid now sparingly - has not needed in last week- only needing tums A/P: improved with short course of pepcid- will continue protonix 40 mg with sparing tums.   -working on gaining weight- slow gradual progress with higher protein in diet  #hypertension S: medication: Amlodipine 2.5 mg daily , Metoprolol 25 mg twice daily.  Home readings #s: home readings are excellent on average- less than 135/85 BP Readings from Last 3 Encounters:  12/01/20 138/68  11/17/20 (!) 172/60  11/13/20 (!) 160/78  A/P: blood pressure improved in office and at home- continue current medications  # Anxiety/GAD S: Klonopin 0.5 mg daily- half tablet given after 11/13/20 visit after discussion with me- patient aware of fall risk and driving precautions -august 27th- took one dose and experienced zombie like sensation- states will never take again Counseling: none at this time GAD  7 : Generalized Anxiety Score 09/03/2020  Nervous, Anxious, on Edge 0  Control/stop worrying 0  Worry too much - different things 0  Trouble relaxing 0  Restless 0  Easily annoyed or irritable 0  Afraid - awful might happen 0  Total GAD 7 Score 0  A/P: anxiety improving - add klonopin to allergy list due to how she didn't tolerate this. Discussed meds vs. therapy - she declines both for now but gave her info for [facility]/verbal referral  #darker urine- patient states doing higher protein now and wants to make sure UA ok- is staying well hydrated  # HM - flu shot today -advised omicron booster for covid  #upcoming eye doctor visit- prior cauterization for eyelash overgrowing but grew back- plans for  all 6 now - irritating eyes   Recommended follow up: No follow-ups on file. Future Appointments  Date Time Provider Faith  01/02/2021 10:40 AM [name], MD [facility]  02/18/2021 11:15 AM Delice Bison, Charlestine Massed, NP [facility] None  10/08/2021  2:30 PM [facility] HEALTH COACH [facility]    Lab/Order associations:   ICD-10-CM   1. Primary hypertension  I10     2. GAD (generalized anxiety disorder)  F41.1     3. Gastroesophageal reflux disease without esophagitis  K21.9     4. Horner's syndrome  G90.2     5. Dark urine  R82.998 POCT Urinalysis Dipstick (Automated)      I,Jada Bradford,acting as a scribe for Garret Reddish, MD.,have documented all relevant documentation on the behalf of Garret Reddish, MD,as directed by  Garret Reddish, MD while in the presence of Garret Reddish, MD.  I, Garret Reddish, MD, have reviewed all documentation for this visit. The documentation on 12/01/20 for the exam, diagnosis, procedures, and orders are all accurate and complete.  Return precautions advised.  Garret Reddish, MD

## 2020-11-28 ENCOUNTER — Encounter: Payer: Self-pay | Admitting: *Deleted

## 2020-12-01 ENCOUNTER — Encounter: Payer: Self-pay | Admitting: Family Medicine

## 2020-12-01 ENCOUNTER — Other Ambulatory Visit: Payer: Self-pay

## 2020-12-01 ENCOUNTER — Ambulatory Visit (INDEPENDENT_AMBULATORY_CARE_PROVIDER_SITE_OTHER): Payer: Medicare Other | Admitting: Family Medicine

## 2020-12-01 VITALS — BP 138/68 | HR 65 | Temp 94.8°F | Wt 118.2 lb

## 2020-12-01 DIAGNOSIS — R319 Hematuria, unspecified: Secondary | ICD-10-CM

## 2020-12-01 DIAGNOSIS — I10 Essential (primary) hypertension: Secondary | ICD-10-CM | POA: Diagnosis not present

## 2020-12-01 DIAGNOSIS — R82998 Other abnormal findings in urine: Secondary | ICD-10-CM | POA: Diagnosis not present

## 2020-12-01 DIAGNOSIS — Z23 Encounter for immunization: Secondary | ICD-10-CM

## 2020-12-01 DIAGNOSIS — G902 Horner's syndrome: Secondary | ICD-10-CM

## 2020-12-01 DIAGNOSIS — F411 Generalized anxiety disorder: Secondary | ICD-10-CM

## 2020-12-01 DIAGNOSIS — E785 Hyperlipidemia, unspecified: Secondary | ICD-10-CM

## 2020-12-01 DIAGNOSIS — K219 Gastro-esophageal reflux disease without esophagitis: Secondary | ICD-10-CM

## 2020-12-01 LAB — URINALYSIS, ROUTINE W REFLEX MICROSCOPIC
Bilirubin Urine: NEGATIVE
Ketones, ur: NEGATIVE
Leukocytes,Ua: NEGATIVE
Nitrite: NEGATIVE
Specific Gravity, Urine: 1.01 (ref 1.000–1.030)
Total Protein, Urine: NEGATIVE
Urine Glucose: NEGATIVE
Urobilinogen, UA: 0.2 (ref 0.0–1.0)
pH: 7 (ref 5.0–8.0)

## 2020-12-01 LAB — POC URINALSYSI DIPSTICK (AUTOMATED)
Bilirubin, UA: NEGATIVE
Blood, UA: POSITIVE
Glucose, UA: NEGATIVE
Ketones, UA: NEGATIVE
Leukocytes, UA: NEGATIVE
Nitrite, UA: NEGATIVE
Protein, UA: NEGATIVE
Spec Grav, UA: 1.01 (ref 1.010–1.025)
Urobilinogen, UA: 0.2 E.U./dL
pH, UA: 7 (ref 5.0–8.0)

## 2020-12-01 NOTE — Addendum Note (Signed)
Addended by: Loura Back on: 12/01/2020 09:39 AM   Modules accepted: Orders

## 2020-12-01 NOTE — Patient Instructions (Addendum)
Health Maintenance Due  Topic Date Due   COVID-19 Vaccine (4 - Booster for [employer] series)  - Please send Omicron vaccination date after getting.  04/29/2020   INFLUENZA VACCINE   - today. 10/20/2020    Please call [phone] to schedule a visit with [facility] - please tell the office you were directly referred by Dr. Yong Channel   Please stop by lab before you go If you have mychart- we will send your results within 3 business days of [place] receiving them.  If you do not have mychart- we will call you about results within 5 business days of [place] receiving them.  *please also note that you will see labs on mychart as soon as they post. I will later go in and write notes on them- will say "notes from Dr. Yong Channel"   Recommended follow up: No follow-ups on file.

## 2020-12-01 NOTE — Addendum Note (Signed)
Addended by: Loura Back on: 12/01/2020 10:30 AM   Modules accepted: Orders

## 2020-12-01 NOTE — Addendum Note (Signed)
Addended by: Loura Back on: 12/01/2020 10:29 AM   Modules accepted: Orders

## 2020-12-01 NOTE — Addendum Note (Signed)
Addended by: Randolm Idol A on: 12/01/2020 10:19 AM   Modules accepted: Orders

## 2020-12-01 NOTE — Addendum Note (Signed)
Addended by: Loura Back on: 12/01/2020 10:28 AM   Modules accepted: Orders

## 2020-12-01 NOTE — Addendum Note (Signed)
Addended by: Loura Back on: 12/01/2020 10:31 AM   Modules accepted: Orders

## 2020-12-02 ENCOUNTER — Telehealth: Payer: Self-pay

## 2020-12-02 ENCOUNTER — Other Ambulatory Visit: Payer: Self-pay

## 2020-12-02 DIAGNOSIS — R319 Hematuria, unspecified: Secondary | ICD-10-CM

## 2020-12-02 LAB — URINE CULTURE
MICRO NUMBER:: 12360447
Result:: NO GROWTH
SPECIMEN QUALITY:: ADEQUATE

## 2020-12-02 NOTE — Telephone Encounter (Signed)
Pt came in yesterday to see Dr Yong Channel and now she believes that she has a UTI. Pt stated that he did the uranalysis yesterday. [place]yolet wants to know since he did the sample yesterday, can he call her in medicine for the UTI. Please Advise.

## 2020-12-02 NOTE — Telephone Encounter (Signed)
Called and lm on pt vm that we are awaiting the urine culture results in order for Dr. Yong Channel to be able to send in the correct antibiotic.

## 2020-12-03 ENCOUNTER — Encounter: Payer: Self-pay | Admitting: Family Medicine

## 2020-12-04 ENCOUNTER — Other Ambulatory Visit: Payer: Self-pay | Admitting: Family Medicine

## 2020-12-04 DIAGNOSIS — N3020 Other chronic cystitis without hematuria: Secondary | ICD-10-CM | POA: Diagnosis not present

## 2020-12-04 DIAGNOSIS — R3129 Other microscopic hematuria: Secondary | ICD-10-CM | POA: Diagnosis not present

## 2020-12-17 DIAGNOSIS — N281 Cyst of kidney, acquired: Secondary | ICD-10-CM | POA: Diagnosis not present

## 2020-12-17 DIAGNOSIS — K3189 Other diseases of stomach and duodenum: Secondary | ICD-10-CM | POA: Diagnosis not present

## 2020-12-17 DIAGNOSIS — K8020 Calculus of gallbladder without cholecystitis without obstruction: Secondary | ICD-10-CM | POA: Diagnosis not present

## 2020-12-17 DIAGNOSIS — N200 Calculus of kidney: Secondary | ICD-10-CM | POA: Diagnosis not present

## 2020-12-17 DIAGNOSIS — R3129 Other microscopic hematuria: Secondary | ICD-10-CM | POA: Diagnosis not present

## 2020-12-24 DIAGNOSIS — H02055 Trichiasis without entropian left lower eyelid: Secondary | ICD-10-CM | POA: Diagnosis not present

## 2020-12-26 ENCOUNTER — Encounter: Payer: Self-pay | Admitting: Family Medicine

## 2020-12-26 DIAGNOSIS — R3129 Other microscopic hematuria: Secondary | ICD-10-CM | POA: Diagnosis not present

## 2020-12-26 DIAGNOSIS — N3020 Other chronic cystitis without hematuria: Secondary | ICD-10-CM | POA: Diagnosis not present

## 2020-12-26 NOTE — Progress Notes (Signed)
Phone [phone]   Subjective:  Patient presents today for their annual physical. Chief complaint-noted.   See problem oriented charting- ROS- full  review of systems was completed and negative except for: constipation is helped by dietary changes, nosebleeds at times, postnasal drip, eye discharge, eye pain, nausea, increased frequency of urination, dysuria, urinary frequency, back pain, headaches behind the left eye, seasonal allergies  The following were reviewed and entered/updated in epic: 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    Eczema    Family history of breast cancer    Family history of kidney cancer    Family history of multiple myeloma    Family history of thyroid cancer    GERD (gastroesophageal reflux disease)    Headache(784.0)    History of IBS    History of kidney stones    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   Osteoarthritis of left knee 09/15/2017    Priority: 2.   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   Hypertension 11/29/2013    Priority: 2.    Hyperlipidemia 11/29/2013    Priority: 2.   Barrett's esophagus 08/24/2013    Priority: 2.   Allergic rhinitis 03/09/2007    Priority: 2.   Osteoporosis 09/22/2006    Priority: 2.   IBS (irritable bowel syndrome) 06/26/2019    Priority: 3.   Aortic atherosclerosis (HCC) 02/11/2016    Priority: 3.   Eczema 11/29/2013    Priority: 3.   GERD (gastroesophageal reflux disease) 05/16/2013    Priority: 3.   Vitamin D deficiency 01/29/2012    Priority: 3.   Solitary pulmonary nodule 06/08/2011    Priority: 3.   HIATAL HERNIA WITH REFLUX 02/06/2010    Priority: 3.   DEGENERATIVE JOINT DISEASE, KNEE 03/14/2008    Priority: 3.   VARICOSE VEINS LOWER EXTREMITIES W/INFLAMMATION 03/09/2007    Priority: 3.   ACTINIC KERATOSIS, FOREHEAD, LEFT 03/09/2007    Priority: 3.   Headache(784.0) 03/09/2007    Priority: 3.   MENIERE'S DISEASE 09/22/2006    Priority: 3.   RAYNAUD'S DISEASE 09/22/2006    Priority: 3.   Genetic testing 11/10/2020   Family history of kidney cancer 10/22/2020   Family history of thyroid cancer 10/22/2020  Family history of multiple myeloma 10/22/2020   Family history of breast cancer 10/22/2020   Malignant neoplasm of lower-inner quadrant of right breast of female, estrogen receptor positive ([place]) 10/17/2020   Past Surgical History:  Procedure Laterality Date   arthroscopic knee  2009   left knee/ torn meniscus   BREAST LUMPECTOMY WITH RADIOACTIVE SEED LOCALIZATION Right 11/05/2020   Procedure: RIGHT BREAST LUMPECTOMY WITH RADIOACTIVE SEED LOCALIZATION;  Surgeon: Erroll Luna, MD;  Location: [facility];  Service: General;  Laterality: Right;   BUNIONECTOMY Right 1998   with other foot surgery    carotid artery disection  1988   Carotid Artery Dissection   CATARACT EXTRACTION, BILATERAL  07-18-2017,08-08-2017   CESAREAN SECTION  1988   1 time   COLONOSCOPY  2019   last 2019   [place] OF UTERUS  2004   POLYPECTOMY      POPLITEAL SYNOVIAL CYST EXCISION     left leg   TONSILLECTOMY  1957   UPPER GASTROINTESTINAL ENDOSCOPY     last 2018    Family History  Problem Relation Age of Onset   Multiple myeloma Father 61   Thyroid cancer Sister 69       s/p removal. papilary and anaplastic.    Lung cancer Sister    Kidney disease Brother        cancer- removed   Kidney cancer Brother        dx early 29s   Alzheimer's disease Brother    Stroke Brother 34   Breast cancer Cousin 64       paternal first cousin   Cancer Cousin        unknown type, paternal first cousin   Breast cancer Other        mother's first cousin   Cervical cancer Other    Colon cancer Neg Hx    Esophageal cancer Neg Hx    Rectal cancer Neg Hx    Stomach cancer Neg Hx    Colon polyps Neg Hx     Medications- reviewed and updated Current Outpatient Medications  Medication Sig Dispense Refill   amLODipine (NORVASC) 2.5 MG tablet TAKE 1 TABLET BY MOUTH EVERY DAY 90 tablet 2   aspirin 81 MG tablet Take 81 mg by mouth daily. Evening     famotidine (PEPCID) 10 MG tablet Take 1 tablet (10 mg total) by mouth 2 (two) times daily. 60 tablet 0   fenofibrate 160 MG tablet TAKE 1 TABLET BY MOUTH EVERY DAY 90 tablet 1   letrozole (FEMARA) 2.5 MG tablet Take 1 tablet (2.5 mg total) by mouth daily. 90 tablet 3   loratadine (CLARITIN) 10 MG tablet Take 10 mg by mouth daily. Take 1/2 tablet daily     meclizine (ANTIVERT) 12.5 MG tablet Take 1 tablet (12.5 mg total) by mouth 3 (three) times daily as needed (off balance). 30 tablet 0   metoprolol tartrate (LOPRESSOR) 50 MG tablet TAKE 1/2 TABLET BY MOUTH 2 TIMES DAILY 90 tablet 1   montelukast (SINGULAIR) 10 MG tablet TAKE 1 TABLET BY MOUTH AT BEDTIME (Patient taking differently: 10 mg.) 90 tablet 1   ondansetron (ZOFRAN-ODT) 4 MG disintegrating tablet Take 1 tablet (4 mg total) by mouth every 8 (eight) hours as needed for nausea or vomiting. 15 tablet 0   pantoprazole (PROTONIX) 40 MG tablet TAKE 1  TABLET BY MOUTH EVERY DAY 90 tablet 1   tobramycin-dexamethasone (TOBRADEX) ophthalmic solution SMARTSIG:4 Drop(s) Left Eye 4 Times Daily  Vitamin D, Cholecalciferol, 1000 units TABS Take by mouth daily. Morning     No current facility-administered medications for this visit.    Allergies-reviewed and updated Allergies  Allergen Reactions   Hydrocodone-Acetaminophen     VOMITING   Azithromycin Other (See Comments)    Thrush    Klonopin [Clonazepam]     -august 27th- took one dose and experienced zombie like sensation- states will never take agian   Neomycin    Penicillins     REACTION: Arm swelling   Pneumococcal Vaccine Polyvalent     ARM REDNESS WITH TENDERNESS   Pneumovax [Pneumococcal Polysaccharide Vaccine]     Social History   Social History Narrative   Lives with husband who is also a patient of Dr. Yong Channel   Objective  Objective:  BP (!) 148/72 Comment: retake in office.  Pulse 73   Temp (!) 97.3 F (36.3 C) (Temporal)   Ht $R'5\' 5"'fU$  (1.651 m)   Wt 114 lb 9.6 oz (52 kg)   SpO2 98%   BMI 19.07 kg/m  Gen: NAD, resting comfortably HEENT: Mucous membranes are moist. Oropharynx normal Neck: no thyromegaly CV: RRR no murmurs rubs or gallops Lungs: CTAB no crackles, wheeze, rhonchi Abdomen: soft/nontender/nondistended/normal bowel sounds. No rebound or guarding.  Ext: no edema Skin: warm, dry Neuro: grossly normal, moves all extremities, PERRLA   Assessment and Plan   80 y.o. female presenting for annual physical.  Health Maintenance counseling: 1. Anticipatory guidance: Patient counseled regarding regular dental exams -q6 months, eye exams - working with Dr. Manuella Ghazi on eyelash issues- cauterization in April, aug 10 plucked 6 eyelahes which grew back- on oct 5 chose not to cauterize (was getting headaches with numbing like a migraine- finally improving today- very slight tenderness behind left eye similar to prior headaches),  avoiding smoking and second hand smoke  , limiting alcohol to 1 beverage per day- doesn't drink . No illicit drugs.   2. Risk factor reduction:  Advised patient of need for regular exercise and diet rich and fruits and vegetables to reduce risk of heart attack and stroke. Exercise- walking with friends 5 days a week for 45-50 mins. . Diet-with stress fo recent week and headaches down slightly on weight- states had been up prior to this.  Wt Readings from Last 3 Encounters:  01/02/21 114 lb 9.6 oz (52 kg)  12/01/20 118 lb 3.2 oz (53.6 kg)  11/17/20 116 lb 3.2 oz (52.7 kg)  3. Immunizations/screenings/ancillary studies DISCUSSED:  -COVID vaccination #4 repeat planned - discussed bivalent booster at pharmacy- was scheduled but plans ot reschedule- had to cancel with headache -Shingrix vaccination #1 - recommended at pharmacy Immunization History  Administered Date(s) Administered   Fluad Quad(high Dose 65+) 12/01/2018, 12/27/2019, 12/01/2020   Influenza Split 01/04/2012   Influenza Whole 01/03/2007, 12/08/2007, 12/17/2008, 01/20/2010   Influenza, High Dose Seasonal PF 01/10/2015, 12/12/2015, 12/29/2016, 12/23/2017   Influenza,inj,Quad PF,6+ Mos 01/05/2013, 12/24/2013   PFIZER(Purple Top)SARS-COV-2 Vaccination 04/30/2019, 05/23/2019, 02/05/2020   Pneumococcal Polysaccharide-23 02/12/2004   Td 03/23/2003, 04/04/2013  4. Cervical cancer screening- past age based screening recommendations. No discharge or blood 5. Breast cancer screening-  mammogram  10/02/20 and found breast cancer- scheduled for June 2023- will see Dr. Brantley Stage  6. Colon cancer screening - February 2020 with no plans for repeat at this point per Dr. Fuller Plan. No brbpr or melena 7. Skin cancer screening-agrees with dermatology yearly typically in January. advised regular sunscreen use. Denies worrisome, changing, or new skin lesions.  8.  Birth control/STD check-postmenopausal and monogamous 9. Osteoporosis screening at 65-10/13/2020 osteopenia-prefers 2 to 3-year repeat.   Hip fracture risk have been above 3% but exam otherwise has been stable-does milk and yogurt, vitamin D. Prefers to hold off for 1 more year -Never smoker  Status of chronic or acute concerns     #Concern for UTI S: Patients symptoms started after cystoscopy on October 7th.  Patien twas seen oct 7 and had cystoscopy with numbing solution- hur tand burned every 30 seconds- hurting today still like UTI. Had similar issues in 2017 when done. Cranberry juice helps some. She wonders if sensitive to numbing solution.  Complains of dysuria: yes; polyuria: yes; nocturia: yes; urgency: yes.  Symptoms are stable.  ROS- no fever, chills, nausea, vomiting, flank pain. No blood in urine.  A/P: UA  will be obtained. Will get culture. Empiric treatment with: opts out- wants to get culture Patient to follow up if new or worsening symptoms or failure to improve.    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  Patient with right lumpectomy 11/05/2020 with negative margins thankfully.  As a result she does not require adjuvant radiation.  She will be on letrozole daily for 5 years.  She is following with Dr.Gudena -some headahes but wondering if related to numbing agents used- left sided headaches- going to continue for now  #parotid gland- has follow up with Dr. Redmond Baseman planned  # cerebral aneurysm- following with Dr. Karen Kitchens is awaiting call back for scheduling   # GERD/history of Barrett's esophagus-not noted on EGD 11/09/2019 with plan for every 3-year follow-up S: Patient reported her GERD worsened and was concerned about her weight loss on 11/13/2020 with Alyssa Allwardt, PA-C. She admitted that she had been very anxious. She stated her husband and child had noted this worsened as well. She recently had been sleeping in a recliner but had been pacing the floors with anxiety a lot.  Pepcid was added which she only needed short-term then converted to Tums-getting by with  Protonix 40 mg now for most part- some pepcid available    -History of hiatal hernia and Barrett's esophagus. Drinks [name] water and the day before last visit, she could only eat cream of wheat.  A/P: overall doing well- continue current med   #hypertension S: medication: Amlodipine 2.5 mg daily , Metoprolol 25 mg twice daily.  BP Readings from Last 3 Encounters:  01/02/21 (!) 148/72  12/01/20 138/68  11/17/20 (!) 172/60  A/P: mild elevation in office today but is having recurrence of headache on left side- she agreed to do some home monitoring over next 2 weeks and update me if not trending back down.  -also update me on headaches in 2 weeks- hold off on neuroimaging No facial or extremity weakness. No slurred words or trouble swallowing. no blurry vision or double vision. No paresthesias other than hands. No confusion or word finding difficulties.   # Anxiety/GAD S: anxiety improving.  Did not tolerate Klonopin at all-see prior notes Counseling: was apparently 1k with [place] and document did not feel comfortable signing A/P: overall doing bette-r wants to hold off on meds and therapy at this time  -considering christian counseling- using support through her faith- member at [employer]- does a small group and listens online   # Hyperglycemia/insulin resistance/prediabetes S:  Medication: None Lab Results  Component Value Date   HGBA1C 6.2 06/26/2020   HGBA1C 6.0 (H) 12/27/2019   HGBA1C 6.2 (H) 10/07/2006   A/P: hopefuly  stable- update a1c- she is worried about manuka honey and cranberry juice   #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 Some numbness in hands at times- shakes it out  #Vitamin D deficiency S: Medication: 1000 units Last vitamin D Lab Results  Component Value Date   VD25OH 39.37 06/26/2020  A/P: hopefully stable- update viatamin D today. Continue  current meds for now   #Allergic rhinitis- thinks drainage causing some nausea- may increase to full claritin instead of a half- I think that's reasonable. Still on singulair  Recommended follow up: No follow-ups on file. Future Appointments  Date Time Provider [place]  02/18/2021 11:15 AM [name], NP [facility] None  10/08/2021  2:30 PM [facility] HEALTH COACH [facility]   Lab/Order associations: fasting   ICD-10-CM   1. Preventative health care  Z00.00     2. Gastroesophageal reflux disease without esophagitis  K21.9     3. Primary hypertension  I10     4. Hyperlipidemia, unspecified hyperlipidemia type  E78.5     5. GAD (generalized anxiety disorder)  F41.1       No orders of the defined types were placed in this encounter.   I,Jada Bradford,acting as a scribe for Garret Reddish, MD.,have documented all relevant documentation on the behalf of Garret Reddish, MD,as directed by  Garret Reddish, MD while in the presence of Garret Reddish, MD.   I, Garret Reddish, MD, have reviewed all documentation for this visit. The documentation on 01/02/21 for the exam, diagnosis, procedures, and orders are all accurate and complete.   Return precautions advised.  Garret Reddish, MD

## 2021-01-02 ENCOUNTER — Encounter: Payer: Self-pay | Admitting: Family Medicine

## 2021-01-02 ENCOUNTER — Ambulatory Visit (INDEPENDENT_AMBULATORY_CARE_PROVIDER_SITE_OTHER): Payer: Medicare Other | Admitting: Family Medicine

## 2021-01-02 ENCOUNTER — Telehealth: Payer: Self-pay | Admitting: *Deleted

## 2021-01-02 ENCOUNTER — Other Ambulatory Visit: Payer: Self-pay

## 2021-01-02 VITALS — BP 148/72 | HR 73 | Temp 97.3°F | Ht 65.0 in | Wt 114.6 lb

## 2021-01-02 DIAGNOSIS — R300 Dysuria: Secondary | ICD-10-CM

## 2021-01-02 DIAGNOSIS — K219 Gastro-esophageal reflux disease without esophagitis: Secondary | ICD-10-CM | POA: Diagnosis not present

## 2021-01-02 DIAGNOSIS — I10 Essential (primary) hypertension: Secondary | ICD-10-CM | POA: Diagnosis not present

## 2021-01-02 DIAGNOSIS — F411 Generalized anxiety disorder: Secondary | ICD-10-CM

## 2021-01-02 DIAGNOSIS — R739 Hyperglycemia, unspecified: Secondary | ICD-10-CM

## 2021-01-02 DIAGNOSIS — E785 Hyperlipidemia, unspecified: Secondary | ICD-10-CM | POA: Diagnosis not present

## 2021-01-02 DIAGNOSIS — Z0000 Encounter for general adult medical examination without abnormal findings: Secondary | ICD-10-CM

## 2021-01-02 DIAGNOSIS — E559 Vitamin D deficiency, unspecified: Secondary | ICD-10-CM | POA: Diagnosis not present

## 2021-01-02 LAB — POC URINALSYSI DIPSTICK (AUTOMATED)
Bilirubin, UA: NEGATIVE
Blood, UA: POSITIVE
Glucose, UA: NEGATIVE
Ketones, UA: NEGATIVE
Nitrite, UA: NEGATIVE
Protein, UA: NEGATIVE
Spec Grav, UA: 1.015 (ref 1.010–1.025)
Urobilinogen, UA: 0.2 E.U./dL
pH, UA: 6 (ref 5.0–8.0)

## 2021-01-02 LAB — COMPREHENSIVE METABOLIC PANEL
ALT: 15 U/L (ref 0–35)
AST: 22 U/L (ref 0–37)
Albumin: 4.3 g/dL (ref 3.5–5.2)
Alkaline Phosphatase: 46 U/L (ref 39–117)
BUN: 22 mg/dL (ref 6–23)
CO2: 26 mEq/L (ref 19–32)
Calcium: 9.9 mg/dL (ref 8.4–10.5)
Chloride: 102 mEq/L (ref 96–112)
Creatinine, Ser: 0.6 mg/dL (ref 0.40–1.20)
GFR: 85.73 mL/min (ref >60.00)
Glucose, Bld: 77 mg/dL (ref 70–99)
Potassium: 3.8 mEq/L (ref 3.5–5.1)
Sodium: 137 mEq/L (ref 135–145)
Total Bilirubin: 0.4 mg/dL (ref 0.2–1.2)
Total Protein: 7.7 g/dL (ref 6.0–8.3)

## 2021-01-02 LAB — CBC WITH DIFFERENTIAL/PLATELET
Basophils Absolute: 0 10*3/uL (ref 0.0–0.1)
Basophils Relative: 0.4 % (ref 0.0–3.0)
Eosinophils Absolute: 0.2 10*3/uL (ref 0.0–0.7)
Eosinophils Relative: 2.1 % (ref 0.0–5.0)
HCT: 36.1 % (ref 36.0–46.0)
Hemoglobin: 11.7 g/dL — ABNORMAL LOW (ref 12.0–15.0)
Lymphocytes Relative: 25.8 % (ref 12.0–46.0)
Lymphs Abs: 1.9 10*3/uL (ref 0.7–4.0)
MCHC: 32.5 g/dL (ref 30.0–36.0)
MCV: 86.2 fl (ref 78.0–100.0)
Monocytes Absolute: 0.7 10*3/uL (ref 0.1–1.0)
Monocytes Relative: 9.7 % (ref 3.0–12.0)
Neutro Abs: 4.6 10*3/uL (ref 1.4–7.7)
Neutrophils Relative %: 62 % (ref 43.0–77.0)
Platelets: 290 10*3/uL (ref 150.0–400.0)
RBC: 4.19 Mil/uL (ref 3.87–5.11)
RDW: 13.9 % (ref 11.5–15.5)
WBC: 7.4 10*3/uL (ref 4.0–10.5)

## 2021-01-02 LAB — LIPID PANEL
Cholesterol: 156 mg/dL (ref 0–200)
HDL: 74.8 mg/dL (ref >39.00)
LDL Cholesterol: 74 mg/dL (ref 0–99)
NonHDL: 81.17
Total CHOL/HDL Ratio: 2
Triglycerides: 37 mg/dL (ref 0.0–149.0)
VLDL: 7.4 mg/dL (ref 0.0–40.0)

## 2021-01-02 LAB — VITAMIN D 25 HYDROXY (VIT D DEFICIENCY, FRACTURES): VITD: 40.07 ng/mL (ref 30.00–100.00)

## 2021-01-02 LAB — HEMOGLOBIN A1C: Hgb A1c MFr Bld: 6.1 % (ref 4.6–6.5)

## 2021-01-02 NOTE — Patient Instructions (Addendum)
Health Maintenance Due  Topic Date Due   Zoster Vaccines- Shingrix (1 of 2)   -Hold off at this time until you feel better.   Never done   COVID-19 Vaccine (4 - Booster for [employer] series)   - Please consider new omicron booster shot this season.   -If recently have covid, please wait 3 months to get shot.  04/29/2020   Please update me on headaches in two weeks along with home blood pressures   Great job on walking! Please keep up the great work!  Please stop by lab before you go If you have mychart- we will send your results within 3 business days of [place] receiving them.  If you do not have mychart- we will call you about results within 5 business days of [place] receiving them.  *please also note that you will see labs on mychart as soon as they post. I will later go in and write notes on them- will say "notes from Dr. Yong Channel"  Recommended follow up: Return in about 6 months (around 07/03/2021) for follow up or sooner if needed.

## 2021-01-02 NOTE — Addendum Note (Signed)
Addended by: Loura Back on: 01/02/2021 12:18 PM   Modules accepted: Orders

## 2021-01-02 NOTE — Telephone Encounter (Signed)
Pt called with c/o h/a since have eye surgery similar to "migraine".  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  She wanted to know if this could be from her letrozol.  Informed possible.  She will try some tylenol this weekend & see if it gets better.  She will touch base on Monday if not better.  Eye doc didn't think it was from the eye numbing. Informed Dr Geralyn Flash RN.

## 2021-01-05 ENCOUNTER — Other Ambulatory Visit: Payer: Self-pay | Admitting: Family Medicine

## 2021-01-05 ENCOUNTER — Encounter: Payer: Self-pay | Admitting: Family Medicine

## 2021-01-05 ENCOUNTER — Other Ambulatory Visit (HOSPITAL_COMMUNITY): Payer: Self-pay | Admitting: Neurosurgery

## 2021-01-05 DIAGNOSIS — I671 Cerebral aneurysm, nonruptured: Secondary | ICD-10-CM

## 2021-01-05 LAB — URINE CULTURE
MICRO NUMBER:: 12504479
SPECIMEN QUALITY:: ADEQUATE

## 2021-01-05 MED ORDER — NITROFURANTOIN MONOHYD MACRO 100 MG PO CAPS: 100.0000 mg | ORAL_CAPSULE | Freq: Two times a day (BID) | ORAL | 0 refills | Status: DC

## 2021-01-09 ENCOUNTER — Telehealth: Payer: Self-pay

## 2021-01-09 MED ORDER — DOXYCYCLINE HYCLATE 100 MG PO TABS: 100.0000 mg | ORAL_TABLET | Freq: Two times a day (BID) | ORAL | 0 refills | Status: AC

## 2021-01-09 NOTE — Telephone Encounter (Signed)
Please see My Chart message from today.   Patient is scheduled to see Dr. Yong Channel on Monday 10/24.  I have transferred patient back through to [facility] for triage again.

## 2021-01-12 ENCOUNTER — Encounter: Payer: Self-pay | Admitting: Family Medicine

## 2021-01-12 ENCOUNTER — Other Ambulatory Visit: Payer: Self-pay

## 2021-01-12 ENCOUNTER — Ambulatory Visit (INDEPENDENT_AMBULATORY_CARE_PROVIDER_SITE_OTHER): Payer: Medicare Other | Admitting: Family Medicine

## 2021-01-12 VITALS — BP 132/78 | HR 85 | Temp 97.4°F | Ht 65.0 in | Wt 116.4 lb

## 2021-01-12 DIAGNOSIS — R319 Hematuria, unspecified: Secondary | ICD-10-CM

## 2021-01-12 DIAGNOSIS — K219 Gastro-esophageal reflux disease without esophagitis: Secondary | ICD-10-CM

## 2021-01-12 DIAGNOSIS — R200 Anesthesia of skin: Secondary | ICD-10-CM

## 2021-01-12 DIAGNOSIS — N390 Urinary tract infection, site not specified: Secondary | ICD-10-CM

## 2021-01-12 DIAGNOSIS — R202 Paresthesia of skin: Secondary | ICD-10-CM | POA: Diagnosis not present

## 2021-01-12 NOTE — Patient Instructions (Addendum)
Please consider getting new omicron shot after getting better.   Please take Pepcid twice daily and restart Nitrofurantoin twice daily.  Please take B12 1,000 mg daily for the next month then for a week.   Recommended follow up: as needed follow-up for new or worsening or persistent symptoms.

## 2021-01-12 NOTE — Progress Notes (Signed)
Phone [phone] In person visit   Subjective:   Lori [name] is a 80 y.o. year old very pleasant female patient who presents for/with See problem oriented charting Chief Complaint  Patient presents with   tingling in tongue & lower lip    All started after UTI & antibiotic was prescribed. Patient also is having a bad bout of GERD.    This visit occurred during the SARS-CoV-2 public health emergency.  Safety protocols were in place, including screening questions prior to the visit, additional usage of staff PPE, and extensive cleaning of exam room while observing appropriate contact time as indicated for disinfecting solutions.   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   Osteoarthritis of left knee 09/15/2017    Priority: 2.   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   Hypertension 11/29/2013    Priority: 2.   Hyperlipidemia 11/29/2013    Priority: 2.   Barrett's esophagus 08/24/2013    Priority: 2.   Allergic rhinitis 03/09/2007    Priority: 2.   Osteoporosis 09/22/2006    Priority: 2.   IBS (irritable bowel syndrome) 06/26/2019    Priority: 3.   Aortic atherosclerosis ([place]) 02/11/2016    Priority: 3.   Eczema 11/29/2013    Priority: 3.   GERD (gastroesophageal reflux disease) 05/16/2013    Priority: 3.   Vitamin D deficiency 01/29/2012    Priority: 3.   Solitary pulmonary nodule 06/08/2011    Priority: 3.   HIATAL HERNIA WITH REFLUX 02/06/2010    Priority: 3.   DEGENERATIVE JOINT DISEASE, KNEE 03/14/2008    Priority: 3.   VARICOSE VEINS LOWER EXTREMITIES W/INFLAMMATION 03/09/2007    Priority: 3.   ACTINIC KERATOSIS, FOREHEAD, LEFT 03/09/2007    Priority: 3.   Headache(784.0) 03/09/2007    Priority: 3.   [place] DISEASE 09/22/2006    Priority: 3.   RAYNAUD'S DISEASE 09/22/2006    Priority: 3.   Genetic testing 11/10/2020   Family history of kidney cancer  10/22/2020   Family history of thyroid cancer 10/22/2020   Family history of multiple myeloma 10/22/2020   Family history of breast cancer 10/22/2020   Malignant neoplasm of lower-inner quadrant of right breast of female, estrogen receptor positive ([place]) 10/17/2020    Medications- reviewed and updated Current Outpatient Medications  Medication Sig Dispense Refill   amLODipine (NORVASC) 2.5 MG tablet TAKE 1 TABLET BY MOUTH EVERY DAY 90 tablet 2   aspirin 81 MG tablet Take 81 mg by mouth daily. Evening     fenofibrate 160 MG tablet TAKE 1 TABLET BY MOUTH EVERY DAY 90 tablet 1   letrozole (FEMARA) 2.5 MG tablet Take 1 tablet (2.5 mg total) by mouth daily. 90 tablet 3   loratadine (CLARITIN) 10 MG tablet Take 10 mg by mouth daily. Take 1/2 tablet daily     meclizine (ANTIVERT) 12.5 MG tablet Take 1 tablet (12.5 mg total) by mouth 3 (three) times daily as needed (off balance). 30 tablet 0   metoprolol tartrate (LOPRESSOR) 50 MG tablet TAKE 1/2 TABLET BY MOUTH 2 TIMES DAILY 90 tablet 1   montelukast (SINGULAIR) 10 MG tablet TAKE 1 TABLET BY MOUTH AT BEDTIME (Patient taking differently: 10 mg.) 90 tablet 1   ondansetron (ZOFRAN-ODT) 4 MG disintegrating tablet Take 1 tablet (4 mg total) by mouth every 8 (eight) hours as needed for nausea or vomiting. 15 tablet  0   pantoprazole (PROTONIX) 40 MG tablet TAKE 1 TABLET BY MOUTH EVERY DAY 90 tablet 1   tobramycin-dexamethasone (TOBRADEX) ophthalmic solution SMARTSIG:4 Drop(s) Left Eye 4 Times Daily     Vitamin D, Cholecalciferol, 1000 units TABS Take by mouth daily. Morning     doxycycline (VIBRA-TABS) 100 MG tablet Take 1 tablet (100 mg total) by mouth 2 (two) times daily for 7 days. (Patient not taking: Reported on 01/12/2021) 14 tablet 0   famotidine (PEPCID) 10 MG tablet Take 1 tablet (10 mg total) by mouth 2 (two) times daily. (Patient not taking: Reported on 01/12/2021) 60 tablet 0   No current facility-administered medications for this visit.      Objective:  BP 132/78 (BP Location: Right Arm, Patient Position: Sitting, Cuff Size: Normal) Comment: retake in office  Pulse 85   Temp (!) 97.4 F (36.3 C) (Temporal)   Ht $R'5\' 5"'KW$  (1.651 m)   Wt 116 lb 6.4 oz (52.8 kg)   SpO2 99%   BMI 19.37 kg/m  Gen: NAD, resting comfortably CV: RRR no murmurs rubs or gallops Lungs: CTAB no crackles, wheeze, rhonchi Ext: no edema Skin: warm, dry     Assessment and Plan   # Tingling in tongue/lower lip #UTI #GERD S: Patient was seen on September 15 by Dr. Frances Nickels was started on Bactrim for 7 days for potential UTI-patient missed a few pills.  I was not aware that patient had been on antibiotics at time of last visit.  Patient ultimately had cystoscopy on October 7 and had immediate pain with concern for UTI afterwards.  I saw patient on 01/02/2021 for her physical-urine culture was done which showed Staph epidermidis (potential contaminant) but with symptoms we opted to treat with nitrofurantoin especially since she was having symptoms  Pt reported in the MyChart message on 01/05/2021 that her tingling/numbness were bothersome. She believed its the medication (nitrofurantoin that she had started for UTI) .  She had recurrent issues each time she tried medication.  We stopped the nitrofurantoin and later changed her to doxycycline-had similar sensation of lip tingling-also had a burning sensation going down with water  Patient then started to note similar sensitivity with any of her medication-lip tingling sensation without enlargement of the tongue or lip-she started to wonder if her reflux could be the cause (also was experiencing some burning with swallowing).  She had been doing well Protonix 40 mg and she had asked if she could use Tums-we are okay with this (she states didn't take much) and later pharmacist told her to avoid with antibiotics.  She now asks if she could short-term try Pepcid.    Staphylococcus epidermidis    URINE  CULTURE POSITIVE 1    CIPROFLOXACIN <=0.5  Sensitive    GENTAMICIN <=0.5  Sensitive    LEVOFLOXACIN <=0.12  Sensitive    NITROFURANTOIN <=16  Sensitive    OXACILLIN NR  Resistant 1    TETRACYCLINE 2  Sensitive    TRIMETH/SULFA 80  Resistant 2    VANCOMYCIN 2  Sensitive    A/P: For UTi- restart nitrofurantoin.  Sounds like poor control of reflux-having some reflux symptoms and will take pepcid along side of her baseline PPI. Hard to say what is causing tingling (other than b12 being low normal in past- discussed starting 107mcg daily for next month then once a week) but as long as no lip or tongue swelling we can have her finish this course of antibiotics-she prefers retrying nitrofurantoin over doxycycline.   -  hematuria noted but has had appropriate workup with urology   Recommended follow up: as needed for new or worsening symptoms Future Appointments  Date Time Provider [place]  01/29/2021  1:00 PM [facility] [place]  02/18/2021 11:15 AM Causey, Charlestine Massed, NP [facility] None  07/13/2021 10:40 AM [name], MD [facility]  10/08/2021  2:30 PM [facility]ALTH COACH [facility]  01/06/2022  9:20 AM Yong Channel, Brayton Mars, MD [facility]   Lab/Order associations:   ICD-10-CM   1. Numbness and tingling  R20.0    R20.2     2. Gastroesophageal reflux disease without esophagitis  K21.9     3. Urinary tract infection with hematuria, site unspecified  N39.0    R31.9      I,Jada Bradford,acting as a scribe for Garret Reddish, MD.,have documented all relevant documentation on the behalf of Garret Reddish, MD,as directed by  Garret Reddish, MD while in the presence of Garret Reddish, MD.  I, Garret Reddish, MD, have reviewed all documentation for this visit. The documentation on 01/12/21 for the exam, diagnosis, procedures, and orders are all accurate and complete.  Return precautions advised.  Garret Reddish, MD

## 2021-01-20 ENCOUNTER — Encounter: Payer: Self-pay | Admitting: Family Medicine

## 2021-01-20 DIAGNOSIS — R300 Dysuria: Secondary | ICD-10-CM

## 2021-01-21 ENCOUNTER — Other Ambulatory Visit: Payer: Self-pay | Admitting: Family Medicine

## 2021-01-21 DIAGNOSIS — D490 Neoplasm of unspecified behavior of digestive system: Secondary | ICD-10-CM | POA: Diagnosis not present

## 2021-01-22 ENCOUNTER — Other Ambulatory Visit: Payer: Self-pay

## 2021-01-22 ENCOUNTER — Other Ambulatory Visit: Payer: Medicare Other

## 2021-01-22 DIAGNOSIS — R300 Dysuria: Secondary | ICD-10-CM

## 2021-01-23 LAB — URINE CULTURE
MICRO NUMBER:: 12589614
Result:: NO GROWTH
SPECIMEN QUALITY:: ADEQUATE

## 2021-01-29 ENCOUNTER — Ambulatory Visit (HOSPITAL_COMMUNITY)
Admission: RE | Admit: 2021-01-29 | Discharge: 2021-01-29 | Disposition: A | Discharge status: Home / Self Care | Payer: Medicare Other | Source: Ambulatory Visit | Attending: Neurosurgery | Admitting: Neurosurgery

## 2021-01-29 DIAGNOSIS — I671 Cerebral aneurysm, nonruptured: Secondary | ICD-10-CM | POA: Diagnosis not present

## 2021-02-05 DIAGNOSIS — I671 Cerebral aneurysm, nonruptured: Secondary | ICD-10-CM | POA: Diagnosis not present

## 2021-02-07 ENCOUNTER — Other Ambulatory Visit: Payer: Self-pay | Admitting: Family Medicine

## 2021-02-18 ENCOUNTER — Other Ambulatory Visit: Payer: Self-pay

## 2021-02-18 ENCOUNTER — Inpatient Hospital Stay: Payer: Medicare Other | Attending: Hematology and Oncology | Admitting: Adult Health

## 2021-02-18 ENCOUNTER — Encounter: Payer: Self-pay | Admitting: Adult Health

## 2021-02-18 VITALS — BP 146/60 | HR 63 | Temp 97.7°F | Resp 18 | Ht 65.0 in | Wt 115.9 lb

## 2021-02-18 DIAGNOSIS — Z79811 Long term (current) use of aromatase inhibitors: Secondary | ICD-10-CM | POA: Diagnosis not present

## 2021-02-18 DIAGNOSIS — Z806 Family history of leukemia: Secondary | ICD-10-CM | POA: Insufficient documentation

## 2021-02-18 DIAGNOSIS — C50311 Malignant neoplasm of lower-inner quadrant of right female breast: Secondary | ICD-10-CM | POA: Insufficient documentation

## 2021-02-18 DIAGNOSIS — E785 Hyperlipidemia, unspecified: Secondary | ICD-10-CM | POA: Diagnosis not present

## 2021-02-18 DIAGNOSIS — M353 Polymyalgia rheumatica: Secondary | ICD-10-CM | POA: Diagnosis not present

## 2021-02-18 DIAGNOSIS — Z170 Estrogen receptor positive status [ER+]: Secondary | ICD-10-CM | POA: Diagnosis not present

## 2021-02-18 DIAGNOSIS — M79602 Pain in left arm: Secondary | ICD-10-CM | POA: Diagnosis not present

## 2021-02-18 DIAGNOSIS — Z801 Family history of malignant neoplasm of trachea, bronchus and lung: Secondary | ICD-10-CM | POA: Insufficient documentation

## 2021-02-18 DIAGNOSIS — I700 Atherosclerosis of aorta: Secondary | ICD-10-CM | POA: Insufficient documentation

## 2021-02-18 DIAGNOSIS — Z803 Family history of malignant neoplasm of breast: Secondary | ICD-10-CM | POA: Insufficient documentation

## 2021-02-18 DIAGNOSIS — K219 Gastro-esophageal reflux disease without esophagitis: Secondary | ICD-10-CM | POA: Insufficient documentation

## 2021-02-18 DIAGNOSIS — K589 Irritable bowel syndrome without diarrhea: Secondary | ICD-10-CM | POA: Insufficient documentation

## 2021-02-18 DIAGNOSIS — Z8719 Personal history of other diseases of the digestive system: Secondary | ICD-10-CM | POA: Diagnosis not present

## 2021-02-18 DIAGNOSIS — Z79899 Other long term (current) drug therapy: Secondary | ICD-10-CM | POA: Insufficient documentation

## 2021-02-18 DIAGNOSIS — Z7982 Long term (current) use of aspirin: Secondary | ICD-10-CM | POA: Diagnosis not present

## 2021-02-18 DIAGNOSIS — Z8052 Family history of malignant neoplasm of bladder: Secondary | ICD-10-CM | POA: Diagnosis not present

## 2021-02-18 DIAGNOSIS — Z87442 Personal history of urinary calculi: Secondary | ICD-10-CM | POA: Diagnosis not present

## 2021-02-18 DIAGNOSIS — Z8673 Personal history of transient ischemic attack (TIA), and cerebral infarction without residual deficits: Secondary | ICD-10-CM | POA: Insufficient documentation

## 2021-02-18 NOTE — Progress Notes (Signed)
SURVIVORSHIP VISIT:    BRIEF ONCOLOGIC HISTORY:  Oncology History  Malignant neoplasm of lower-inner quadrant of right breast of female, estrogen receptor positive ([place])  10/13/2020 Initial Diagnosis   Screening mammogram showed indeterminate mass in the right breast. Diagnostic mammogram and US showed 1 cm x 1.3 cm suspicious irregular mass at 4:00 4 cm from the nipple. 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   10/22/2020 Cancer Staging   Staging form: Breast, AJCC 8th Edition - Clinical stage from 10/22/2020: Stage IA (cT1b, cN0, cM0, G2, ER+, PR+, HER2-) - Signed by Nicholas Lose, MD on 10/22/2020 Stage prefix: Initial diagnosis Histologic grading system: 3 grade system    11/05/2020 Genetic Testing   Negative hereditary cancer genetic testing: no pathogenic variants detected in [facility] CancerNext-Expanded +RNAinsight Panel.  The report date is November 05, 2020.    The CancerNext-Expanded gene panel offered by [facility] and includes sequencing, rearrangement, and RNA analysis for the following 77 genes: AIP, ALK, APC, ATM, AXIN2, BAP1, BARD1, BLM, BMPR1A, BRCA1, BRCA2, BRIP1, CDC73, CDH1, CDK4, CDKN1B, CDKN2A, CHEK2, CTNNA1, DICER1, FANCC, FH, FLCN, GALNT12, KIF1B, LZTR1, MAX, MEN1, MET, MLH1, MSH2, MSH3, MSH6, MUTYH, NBN, NF1, NF2, NTHL1, PALB2, PHOX2B, PMS2, POT1, PRKAR1A, PTCH1, PTEN, RAD51C, RAD51D, RB1, RECQL, RET, SDHA, SDHAF2, SDHB, SDHC, SDHD, SMAD4, SMARCA4, SMARCB1, SMARCE1, STK11, SUFU, TMEM127, TP53, TSC1, TSC2, VHL and XRCC2 (sequencing and deletion/duplication); EGFR, EGLN1, HOXB13, KIT, MITF, PDGFRA, POLD1, and POLE (sequencing only); EPCAM and GREM1 (deletion/duplication only).    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, margins negative, ER 95%, PR 80%, HER2 negative, Ki-67 10%   11/05/2020 Cancer Staging   Staging form: Breast, AJCC 8th Edition - Pathologic stage from 11/05/2020: Stage IA (pT1b, pN0, cM0, G2,  ER+, PR+, HER2-) - Signed by [name], NP on 02/18/2021 Stage prefix: Initial diagnosis Histologic grading system: 3 grade system    11/2020 -  Anti-estrogen oral therapy   Letrozole daily     INTERVAL HISTORY:  Lori [name] to review her survivorship care plan detailing her treatment course for breast cancer, as well as monitoring long-term side effects of that treatment, education regarding health maintenance, screening, and overall wellness and health promotion.     Overall, Lori [name] reports feeling moderately well.  She is taking Letrozole daily.  She normally walks and over the past three weeks she has not been walking due to her husbands health.  She has noted increased arthalgias.  She has some questions about soy today.    She notes some lower back pain and left upper arm pain for the past few weeks.  She denies any radiation or chest pain with the left arm pain.  She has not been evaluated by anyone for this.    REVIEW OF SYSTEMS:  Review of Systems  Constitutional:  Positive for fatigue. Negative for appetite change, chills, fever and unexpected weight change.  HENT:   Negative for hearing loss, lump/mass and trouble swallowing.   Eyes:  Negative for eye problems and icterus.  Respiratory:  Negative for chest tightness, cough and shortness of breath.   Cardiovascular:  Negative for chest pain, leg swelling and palpitations.  Gastrointestinal:  Negative for abdominal distention, abdominal pain, constipation, diarrhea, nausea and vomiting.  Endocrine: Positive for hot flashes.  Genitourinary:  Negative for difficulty urinating.   Musculoskeletal:  Negative for arthralgias.  Skin:  Negative for itching and rash.  Neurological:  Negative for dizziness, extremity weakness, headaches  and numbness.  Hematological:  Negative for adenopathy. Does not bruise/bleed easily.  Psychiatric/Behavioral:  Negative for depression. The patient is not nervous/anxious.   Breast:  Denies any new nodularity, masses, tenderness, nipple changes, or nipple discharge.      ONCOLOGY TREATMENT TEAM:  1. Surgeon:  Dr. Brantley Stage at [facility] 2. Medical Oncologist: Dr. Lindi Adie  3. Radiation Oncologist: Dr. 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    Eczema    Family history of breast cancer    Family history of kidney cancer    Family history of multiple myeloma    Family history of thyroid cancer    GERD (gastroesophageal reflux disease)    Headache(784.0)    History of IBS    History of kidney stones    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    Hyperlipidemia    on medication   Hypertension    Low back pain    Meniere disease    Neuromuscular disorder ([place])    raynaud's   Osteopenia    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[place])    Stroke ([place]) 1988   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 carotid artery dissection    Tubular adenoma of colon 02/2013   Varicose veins with inflammation    upper and lower per pt    Vasculitis [facility])    Past Surgical History:  Procedure Laterality Date   arthroscopic knee  2009   left knee/ torn meniscus   BREAST LUMPECTOMY WITH RADIOACTIVE SEED LOCALIZATION Right 11/05/2020   Procedure: RIGHT BREAST LUMPECTOMY WITH RADIOACTIVE SEED LOCALIZATION;  Surgeon: Erroll Luna, MD;  Location: [facility];  Service: General;  Laterality: Right;   BUNIONECTOMY Right 1998   with other foot surgery    carotid artery disection  1988   Carotid Artery Dissection    CATARACT EXTRACTION, BILATERAL  07-18-2017,08-08-2017   CESAREAN SECTION  1988   1 time   COLONOSCOPY  2019   last 2019   [place] OF UTERUS  2004   POLYPECTOMY     POPLITEAL SYNOVIAL CYST EXCISION     left leg   TONSILLECTOMY  1957   UPPER GASTROINTESTINAL ENDOSCOPY     last 2018     ALLERGIES:  Allergies  Allergen Reactions   Hydrocodone-Acetaminophen     VOMITING   Azithromycin Other (See Comments)    Thrush    Klonopin [Clonazepam]     -august 27th- took one dose and experienced zombie like sensation- states will never take agian   Neomycin    Nitrofurantoin     Reports tingling on tongue- no swelling   Penicillins     REACTION: Arm swelling   Pneumococcal Vaccine Polyvalent     ARM REDNESS WITH TENDERNESS   Pneumovax [Pneumococcal Polysaccharide Vaccine]      CURRENT MEDICATIONS:  Outpatient Encounter Medications as of 02/18/2021  Medication Sig Note   amLODipine (NORVASC) 2.5 MG tablet TAKE 1 TABLET BY MOUTH EVERY DAY    aspirin 81 MG tablet Take 81 mg by mouth daily. Evening    famotidine (PEPCID) 10 MG tablet Take 1 tablet (10 mg total) by mouth 2 (two) times daily. (Patient not taking: Reported on 01/12/2021)    fenofibrate 160 MG tablet TAKE 1 TABLET BY MOUTH EVERY DAY    letrozole (FEMARA) 2.5 MG tablet Take 1 tablet (2.5 mg total) by mouth daily.    loratadine (CLARITIN) 10 MG tablet Take 10 mg by mouth daily. Take 1/2 tablet daily 06/26/2020: Taking 1/2 tablet daily   meclizine (ANTIVERT) 12.5 MG tablet Take 1 tablet (12.5 mg total) by mouth 3 (three) times daily as needed (off balance).    metoprolol tartrate (LOPRESSOR) 50 MG tablet TAKE 1/2 TABLET BY MOUTH 2 TIMES DAILY    montelukast (SINGULAIR) 10 MG tablet TAKE 1 TABLET BY MOUTH AT BEDTIME (Patient taking differently: 10 mg.)    ondansetron (ZOFRAN-ODT) 4 MG disintegrating tablet Take 1 tablet (4 mg total) by mouth every 8 (eight) hours as needed for nausea or vomiting.    pantoprazole  (PROTONIX) 40 MG tablet TAKE 1 TABLET BY MOUTH EVERY DAY    tobramycin-dexamethasone (TOBRADEX) ophthalmic solution SMARTSIG:4 Drop(s) Left Eye 4 Times Daily    Vitamin D, Cholecalciferol, 1000 units TABS Take by mouth daily. Morning 12/01/2018: [employer]    No facility-administered encounter medications on file as of 02/18/2021.     ONCOLOGIC FAMILY HISTORY:  Family History  Problem Relation Age of Onset   Multiple myeloma Father 57   Thyroid cancer Sister 50       s/p removal. papilary and anaplastic.    Lung cancer Sister    Kidney disease Brother        cancer- removed   Kidney cancer Brother        dx early 41s   Alzheimer's disease Brother    Stroke Brother 63   Breast cancer Cousin 12       paternal first cousin   Cancer Cousin        unknown type, paternal first cousin   Breast cancer Other        mother's first cousin   Cervical cancer Other    Colon cancer Neg Hx    Esophageal cancer Neg Hx    Rectal cancer Neg Hx    Stomach cancer Neg Hx    Colon polyps Neg Hx      GENETIC COUNSELING/TESTING: See above  SOCIAL HISTORY:  Social History   Socioeconomic History   Marital status: Married    Spouse name: Not on file   Number of children: Not on file   Years of education: Not on file   Highest education level: Not on file  Occupational History   Occupation: retired  Tobacco Use   Smoking status: Never   Smokeless tobacco: Never  Substance and Sexual Activity   Alcohol use: No    Alcohol/week: 0.0 standard drinks   Drug use: No   Sexual activity: Yes    Birth control/protection: Post-menopausal  Other Topics Concern   Not on file  Social History Narrative   Lives with husband who is also a patient of Dr. Yong Channel. Alvester Chou [address], Karleen Hampshire (sees Dr. Yong Channel), Judson Roch 35. 3 grandkids in 2022   Social Determinants of Health   Financial Resource Strain: Low Risk  Difficulty of Paying Living Expenses: Not hard at all  Food Insecurity: No Food  Insecurity   Worried About [place] in the Last Year: Never true   Ran Out of Food in the Last Year: Never true  Transportation Needs: No Transportation Needs   Lack of Transportation (Medical): No   Lack of Transportation (Non-Medical): No  Physical Activity: Sufficiently Active   Days of Exercise per Week: 6 days   Minutes of Exercise per Session: 50 min  Stress: No Stress Concern Present   Feeling of Stress : Not at all  Social Connections: Socially Integrated   Frequency of Communication with Friends and Family: More than three times a week   Frequency of Social Gatherings with Friends and Family: More than three times a week   Attends Religious Services: More than 4 times per year   Active Member of [employer] or Organizations: Yes   Attends Archivist Meetings: 1 to 4 times per year   Marital Status: Married  Human resources officer Violence: Not At Risk   Fear of Current or Ex-Partner: No   Emotionally Abused: No   Physically Abused: No   Sexually Abused: No     OBSERVATIONS/OBJECTIVE:  BP (!) 146/60 (BP Location: Left Arm, Patient Position: Sitting)   Pulse 63   Temp 97.7 F (36.5 C) (Tympanic)   Resp 18   Ht $R'5\' 5"'lD$  (1.651 m)   Wt 115 lb 14.4 oz (52.6 kg)   SpO2 98%   BMI 19.29 kg/m  GENERAL: Patient is a well appearing female in no acute distress HEENT:  Sclerae anicteric.  Oropharynx clear and moist. No ulcerations or evidence of oropharyngeal candidiasis. Neck is supple.  NODES:  No cervical, supraclavicular, or axillary lymphadenopathy palpated.  BREAST EXAM:  right breast s/p lumpectomy, no sign of local recurrence left breast benign LUNGS:  Clear to auscultation bilaterally.  No wheezes or rhonchi. HEART:  Regular rate and rhythm. No murmur appreciated. ABDOMEN:  Soft, nontender.  Positive, normoactive bowel sounds. No organomegaly palpated. MSK:  No focal spinal tenderness to palpation. Full range of motion bilaterally in the upper  extremities. EXTREMITIES:  No peripheral edema.   SKIN:  Clear with no obvious rashes or skin changes. No nail dyscrasia. NEURO:  Nonfocal. Well oriented.  Appropriate affect.   LABORATORY DATA:  None for this visit.  DIAGNOSTIC IMAGING:  None for this visit.      ASSESSMENT AND PLAN:  Ms.. [name] is a pleasant 80 y.o. female with Stage IA right breast invasive ductal carcinoma, ER+/PR+/HER2-, diagnosed in 10/2020, treated with lumpectomy and anti-estrogen therapy with Letrozole beginning in 11/2020.  She presents to the Survivorship Clinic for our initial meeting and routine follow-up post-completion of treatment for breast cancer.    1. Stage IA right breast cancer:  Lori [name] is continuing to recover from definitive treatment for breast cancer. She will follow-up with her medical oncologist, Dr. Lindi Adie in 6 months with history and physical exam per surveillance protocol.  She will continue her anti-estrogen therapy with Letrozole. Thus far, she is tolerating the Letrozole moderately well, with minimal side effects. She was instructed to make Dr. Lindi Adie or myself aware if she begins to experience any worsening side effects of the medication and I could see her back in clinic to help manage those side effects, as needed. Her mammogram is due 08/2021; orders placed today. Today, a comprehensive survivorship care plan and treatment summary was reviewed with the patient today detailing her  breast cancer diagnosis, treatment course, potential late/long-term effects of treatment, appropriate follow-up care with recommendations for the future, and patient education resources.  A copy of this summary, along with a letter will be sent to the patient's primary care provider via mail/fax/In Basket message after today's visit.    2.  Left arm pain and lower back pain: I recommended that she undergo evaluation with plain film imaging today.  She would like to do this with her PCP.  I encouraged her to go  ahead and do this today.  We also talked about her pain.  I reviewed with her that her age and other cardiovascular this, that she may want to review with her primary care and rule out any cardiac etiology as well.  3. Bone health:  She should get bone density testing completed every 2 years.  We tal She was given education on specific activities to promote bone health.  4. Cancer screening:  Due to Lori [name]'s history and her age, she should receive screening for skin cancers.  The information and recommendations are listed on the patient's comprehensive care plan/treatment summary and were reviewed in detail with the patient.    5. Health maintenance and wellness promotion: Lori [name] was encouraged to consume 5-7 servings of fruits and vegetables per day. We reviewed the "Nutrition Rainbow" handout.  She was also encouraged to engage in moderate to vigorous exercise for 30 minutes per day most days of the week. We discussed the [employer] fitness program, which is designed for cancer survivors to help them become more physically fit after cancer treatments.  She was instructed to limit her alcohol consumption and continue to abstain from tobacco use.     6. Support services/counseling: It is not uncommon for this period of the patient's cancer care trajectory to be one of many emotions and stressors.  We discussed how this can be increasingly difficult during the times of quarantine and social distancing due to the COVID-19 pandemic.   She was given information regarding our available services and encouraged to contact me with any questions or for help enrolling in any of our support group/programs.    Follow up instructions:    -Return to cancer center in 6 months for f/u with Dr. Lindi Adie  -Mammogram due in 08/2021 -Follow up with surgery in one year -She is welcome to return back to the Survivorship Clinic at any time; no additional follow-up needed at this time.  -Consider referral back  to survivorship as a long-term survivor for continued surveillance  The patient was provided an opportunity to ask questions and all were answered. The patient agreed with the plan and demonstrated an understanding of the instructions.   Total encounter time: 40 minutes in face-to-face visit time, chart review, lab review, care coordination, and documentation of the encounter.  Wilber Bihari, NP 02/24/21 4:52 PM Medical Oncology and Hematology [facility] [place], [place] [address] Tel. [phone]    Fax. [phone]  *Total Encounter Time as defined by the Centers for Medicare and Medicaid Services includes, in addition to the face-to-face time of a patient visit (documented in the note above) non-face-to-face time: obtaining and reviewing outside history, ordering and reviewing medications, tests or procedures, care coordination (communications with other health care professionals or caregivers) and documentation in the medical record.

## 2021-02-20 ENCOUNTER — Ambulatory Visit (INDEPENDENT_AMBULATORY_CARE_PROVIDER_SITE_OTHER): Payer: Medicare Other | Admitting: Physician Assistant

## 2021-02-20 ENCOUNTER — Other Ambulatory Visit: Payer: Self-pay

## 2021-02-20 ENCOUNTER — Encounter: Payer: Self-pay | Admitting: Physician Assistant

## 2021-02-20 VITALS — BP 132/78 | HR 80 | Temp 98.0°F | Ht 66.0 in | Wt 113.8 lb

## 2021-02-20 DIAGNOSIS — M79602 Pain in left arm: Secondary | ICD-10-CM

## 2021-02-20 DIAGNOSIS — G8929 Other chronic pain: Secondary | ICD-10-CM

## 2021-02-20 DIAGNOSIS — R310 Gross hematuria: Secondary | ICD-10-CM

## 2021-02-20 DIAGNOSIS — M816 Localized osteoporosis [Lequesne]: Secondary | ICD-10-CM

## 2021-02-20 DIAGNOSIS — M546 Pain in thoracic spine: Secondary | ICD-10-CM | POA: Diagnosis not present

## 2021-02-20 LAB — POC URINALSYSI DIPSTICK (AUTOMATED)
Bilirubin, UA: NEGATIVE
Blood, UA: POSITIVE
Glucose, UA: NEGATIVE
Ketones, UA: NEGATIVE
Leukocytes, UA: NEGATIVE
Nitrite, UA: NEGATIVE
Protein, UA: NEGATIVE
Spec Grav, UA: 1.015 (ref 1.010–1.025)
Urobilinogen, UA: 0.2 E.U./dL
pH, UA: 6 (ref 5.0–8.0)

## 2021-02-20 NOTE — Progress Notes (Signed)
Lori [name] is a 80 y.o. female here for shoulder and elbow pain.    History of Present Illness:   Chief Complaint  Patient presents with   Shoulder Pain    Left side, starts at shoulder moves down whole arm. Ongoing for about 2 months    HPI  Left Shoulder Pain Lori [name] presents with c/o left shoulder pain that radiates down her arm. Pt describes the pain as an achy sensation and has been ongoing for two months. Reports she mainly feels the achy sensation when she is putting on her coat or raising her arm up. At this time she is not taking any medication for pain relief due to being on aspirin 81 mg and sensitivity to medications. Denies chest pain or recent injury.   Back Spasms In addition to shoulder pain, Lori [name] has noticed that her left sided back spasms have been getting worse. States this problem has been occurring since 2015 and used to happen every once in a while. Currently she reports this is happening everyday and that it causes her to stop her activities. Lori [name] does admit that she had a small amount of hematuria and darkened urine last week, but believes she could've passed a small kidney stone. She does have a hx of UTIs and kidney stones.    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   Anxiety    Aortic atherosclerosis (HCC)    Arthritis    knee- DJD    Barrett's esophagus    Breast cancer (HCC)    right breast IDC   Burning mouth syndrome    Dr Redmond Baseman 11-2016 - no smell or taste x 4 yrs per pt    Cataract    bilateral    Clotting disorder ([place]) 1988   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    Eczema    Family history of breast cancer    Family history of kidney cancer    Family history of multiple myeloma    Family history of thyroid cancer    GERD (gastroesophageal reflux disease)    Headache(784.0)    History of IBS    History of kidney stones     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    Hyperlipidemia    on medication   Hypertension    Low back pain    Meniere disease    Neuromuscular disorder ([place])    raynaud's   Osteopenia    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[place])    Stroke ([place]) 1988   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 carotid artery dissection    Tubular adenoma of colon 02/2013   Varicose veins with inflammation    upper and lower per pt    Vasculitis ([place])      Social History   Tobacco Use   Smoking status: Never   Smokeless tobacco: Never  Substance Use Topics   Alcohol use: No    Alcohol/week: 0.0 standard drinks   Drug use: No    Past Surgical History:  Procedure Laterality Date   arthroscopic knee  2009   left knee/ torn meniscus   BREAST LUMPECTOMY WITH RADIOACTIVE SEED LOCALIZATION Right 11/05/2020   Procedure: RIGHT BREAST LUMPECTOMY WITH RADIOACTIVE SEED LOCALIZATION;  Surgeon: Erroll Luna, MD;  Location: [facility];  Service: General;  Laterality: Right;   BUNIONECTOMY Right  1998   with other foot surgery    carotid artery disection  1988   Carotid Artery Dissection   CATARACT EXTRACTION, BILATERAL  07-18-2017,08-08-2017   CESAREAN SECTION  1988   1 time   COLONOSCOPY  2019   last 2019   DILATION AND CURETTAGE OF UTERUS  2004   POLYPECTOMY     POPLITEAL SYNOVIAL CYST EXCISION     left leg   TONSILLECTOMY  1957   UPPER GASTROINTESTINAL ENDOSCOPY     last 2018    Family History  Problem Relation Age of Onset   Multiple myeloma Father 69   Thyroid cancer Sister 24       s/p removal. papilary and anaplastic.    Lung cancer Sister    Kidney disease Brother        cancer- removed   Kidney cancer Brother        dx early 71s   Alzheimer's disease Brother    Stroke Brother 37   Breast cancer Cousin 41       paternal first cousin   Cancer Cousin        unknown type, paternal first cousin   Breast cancer Other        mother's first cousin   Cervical cancer Other    Colon cancer Neg  Hx    Esophageal cancer Neg Hx    Rectal cancer Neg Hx    Stomach cancer Neg Hx    Colon polyps Neg Hx     Allergies  Allergen Reactions   Hydrocodone-Acetaminophen     VOMITING   Azithromycin Other (See Comments)    Thrush    Klonopin [Clonazepam]     -august 27th- took one dose and experienced zombie like sensation- states will never take agian   Neomycin    Nitrofurantoin     Reports tingling on tongue- no swelling   Penicillins     REACTION: Arm swelling   Pneumococcal Vaccine Polyvalent     ARM REDNESS WITH TENDERNESS   Pneumovax [Pneumococcal Polysaccharide Vaccine]     Current Medications:   Current Outpatient Medications:    amLODipine (NORVASC) 2.5 MG tablet, TAKE 1 TABLET BY MOUTH EVERY DAY, Disp: 90 tablet, Rfl: 2   aspirin 81 MG tablet, Take 81 mg by mouth daily. Evening, Disp: , Rfl:    fenofibrate 160 MG tablet, TAKE 1 TABLET BY MOUTH EVERY DAY, Disp: 90 tablet, Rfl: 1   letrozole (FEMARA) 2.5 MG tablet, Take 1 tablet (2.5 mg total) by mouth daily., Disp: 90 tablet, Rfl: 3   loratadine (CLARITIN) 10 MG tablet, Take 10 mg by mouth daily. Take 1/2 tablet daily, Disp: , Rfl:    meclizine (ANTIVERT) 12.5 MG tablet, Take 1 tablet (12.5 mg total) by mouth 3 (three) times daily as needed (off balance)., Disp: 30 tablet, Rfl: 0   metoprolol tartrate (LOPRESSOR) 50 MG tablet, TAKE 1/2 TABLET BY MOUTH 2 TIMES DAILY, Disp: 90 tablet, Rfl: 1   montelukast (SINGULAIR) 10 MG tablet, TAKE 1 TABLET BY MOUTH AT BEDTIME (Patient taking differently: 10 mg.), Disp: 90 tablet, Rfl: 1   ondansetron (ZOFRAN-ODT) 4 MG disintegrating tablet, Take 1 tablet (4 mg total) by mouth every 8 (eight) hours as needed for nausea or vomiting., Disp: 15 tablet, Rfl: 0   pantoprazole (PROTONIX) 40 MG tablet, TAKE 1 TABLET BY MOUTH EVERY DAY, Disp: 90 tablet, Rfl: 1   Vitamin D, Cholecalciferol, 1000 units TABS, Take by mouth daily. Morning, Disp: , Rfl:  famotidine (PEPCID) 10 MG tablet, Take 1  tablet (10 mg total) by mouth 2 (two) times daily. (Patient not taking: Reported on 01/12/2021), Disp: 60 tablet, Rfl: 0   Review of Systems:   ROS Negative unless otherwise specified per HPI. Vitals:   Vitals:   02/20/21 1121  BP: 132/78  Pulse: 80  Temp: 98 F (36.7 C)  TempSrc: Temporal  SpO2: 97%  Weight: 113 lb 12.8 oz (51.6 kg)  Height: $Remove'5\' 6"'nrHgRJC$  (1.676 m)     Body mass index is 18.37 kg/m.  Physical Exam:   Physical Exam Vitals and nursing note reviewed.  Constitutional:      General: She is not in acute distress.    Appearance: She is well-developed. She is not ill-appearing or toxic-appearing.  Cardiovascular:     Rate and Rhythm: Normal rate and regular rhythm.     Pulses: Normal pulses.     Heart sounds: Normal heart sounds, S1 normal and S2 normal.  Pulmonary:     Effort: Pulmonary effort is normal.     Breath sounds: Normal breath sounds.  Musculoskeletal:       Arms:     Comments: Back: No decreased ROM 2/2 pain with flexion/extension, lateral side bends, or rotation. No reproducible tenderness with deep palpation to bilateral paraspinal muscles. No bony tenderness. No evidence of erythema, rash or ecchymosis.    Skin:    General: Skin is warm and dry.  Neurological:     Mental Status: She is alert.     GCS: GCS eye subscore is 4. GCS verbal subscore is 5. GCS motor subscore is 6.  Psychiatric:        Speech: Speech normal.        Behavior: Behavior normal. Behavior is cooperative.   Results for orders placed or performed in visit on 02/20/21  POCT Urinalysis Dipstick (Automated)  Result Value Ref Range   Color, UA yellow    Clarity, UA clear    Glucose, UA Negative Negative   Bilirubin, UA neg    Ketones, UA neg    Spec Grav, UA 1.015 1.010 - 1.025   Blood, UA positive    pH, UA 6.0 5.0 - 8.0   Protein, UA Negative Negative   Urobilinogen, UA 0.2 0.2 or 1.0 E.U./dL   Nitrite, UA neg    Leukocytes, UA Negative Negative     Assessment and  Plan:   Left Shoulder Pain  No red flags  Suspect possible rotator cuff pathology and/or bicep tendonitis Patient is hesitant to trial any topical creams, patches or oral medications due to baseline sensitivity to medications Will send referral to [facility] for further evaluation  Chronic Left Sided Thoracic Back Pain No red flags except for single episode of hematuria (see below) Will send referral to [facility] for further evaluation Patient is hesitant to trial any topical creams, patches or oral medications due to baseline sensitivity to medications  Gross Hematuria UA with blood, however this is her baseline - no other symptoms Will update culture today Low threshold to start antibiotic if culture positive, develops any symptoms and refer back to urology if needed  - Urine Culture  - POCT Urinalysis Dipstick   *She mentioned several concerns about updating her DEXA -- I have asked my staff to order DEXA under PCP name for her to have this done at [place] as a scribe for [employer], PA.,have documented all relevant documentation on the  behalf of [name], PA,as directed by  [name], PA while in the presence of [name], [place].  I, [name], [place], have reviewed all documentation for this visit. The documentation on 02/20/21 for the exam, diagnosis, procedures, and orders are all accurate and complete.  Time spent with patient today was 26 minutes which consisted of chart review, discussing diagnosis, work up, treatment answering questions and documentation.   [name], PA-C

## 2021-02-20 NOTE — Patient Instructions (Addendum)
It was great to see you!  A referral has been placed for you to see [place]. Someone from their office will be in touch soon regarding your appointment with him. His location: [place] at [facility] [address] on the 1st floor.   Phone number [phone], Fax [phone].  This location is across the street from the entrance to [employer] and in the same complex as the [facility] and [employer] [facility]  I will have Dr. Yong Channel put your DEXA scan order in and you can call our office to schedule that at your convenience.  I will be in touch with your urine results.  Take care,  [name] PA-C

## 2021-02-21 LAB — URINE CULTURE
MICRO NUMBER:: 12707313
Result:: NO GROWTH
SPECIMEN QUALITY:: ADEQUATE

## 2021-02-24 ENCOUNTER — Telehealth: Payer: Self-pay

## 2021-02-24 NOTE — Telephone Encounter (Signed)
Left VM for pt - she wanted to know what MM DIAG is, this is an order for your mammogram at [facility].  Please call back with questions or concerns.

## 2021-02-24 NOTE — Telephone Encounter (Signed)
Left VM for pt to return our call.  She states she has a question but did not leave the question on my VM to answer.

## 2021-02-27 ENCOUNTER — Other Ambulatory Visit: Payer: Self-pay

## 2021-02-27 ENCOUNTER — Encounter: Payer: Self-pay | Admitting: Family

## 2021-02-27 ENCOUNTER — Ambulatory Visit (INDEPENDENT_AMBULATORY_CARE_PROVIDER_SITE_OTHER): Payer: Medicare Other | Admitting: Family

## 2021-02-27 VITALS — BP 150/68 | HR 74 | Temp 98.0°F | Ht 66.0 in | Wt 115.6 lb

## 2021-02-27 DIAGNOSIS — H938X1 Other specified disorders of right ear: Secondary | ICD-10-CM

## 2021-02-27 NOTE — Progress Notes (Signed)
Subjective:     Patient ID: Lori [name], female    DOB: [birth year], 80 y.o.   MRN: [id number]  Chief Complaint  Patient presents with   Ear Pain    Pt complains of what she describes as a "crackling" sound in her ear for the passed 3 days. She nom longer hears this sound. She also complains of a hollow sound when clinching her teeth together.      HPI: Ear sensation - pt describes a 3 day history of Right ear sensation with fullness, popping sensation. Denies associated pain, hearing loss, discharge, itching, dizziness associated with head movements, or fever. Also reports a hollow sound when opening & closing her jaw. Denies any recent allergy or cold symptoms. Reports she has chronic rhinitis.   Health Maintenance Due  Topic Date Due   Zoster Vaccines- Shingrix (1 of 2) Never done   Pneumonia Vaccine 47+ Years old (2 - PCV) 02/11/2005    Past Medical History:  Diagnosis Date   Allergy    Anemia    past hx of anemia   Aneurysm ([place])    pseudo-aneurym of carotid arteries per pt   Anxiety    Aortic atherosclerosis (HCC)    Arthritis    knee- DJD    Barrett's esophagus    Breast cancer ([place])    right breast IDC   Burning mouth syndrome    Dr Redmond Baseman 11-2016 - no smell or taste x 4 yrs per pt    Cataract    bilateral    Clotting disorder ([place]) 1988   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    Eczema    Family history of breast cancer    Family history of kidney cancer    Family history of multiple myeloma    Family history of thyroid cancer    GERD (gastroesophageal reflux disease)    Headache(784.0)    History of IBS    History of kidney stones    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    Hyperlipidemia    on medication   Hypertension    Low back pain    Meniere disease    Neuromuscular disorder ([place])    raynaud's   Osteopenia    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[place])    Stroke ([place]) 1988   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 carotid artery dissection    Tubular  adenoma of colon 02/2013   Varicose veins with inflammation    upper and lower per pt    Vasculitis [facility])     Past Surgical History:  Procedure Laterality Date   arthroscopic knee  2009   left knee/ torn meniscus   BREAST LUMPECTOMY WITH RADIOACTIVE SEED LOCALIZATION Right 11/05/2020   Procedure: RIGHT BREAST LUMPECTOMY WITH RADIOACTIVE SEED LOCALIZATION;  Surgeon: Erroll Luna, MD;  Location: [facility];  Service: General;  Laterality: Right;   BUNIONECTOMY Right 1998   with other foot surgery    carotid artery disection  1988   Carotid Artery Dissection   CATARACT EXTRACTION, BILATERAL  07-18-2017,08-08-2017   CESAREAN SECTION  1988   1 time   COLONOSCOPY  2019   last 2019   [place] OF UTERUS  2004   POLYPECTOMY     POPLITEAL SYNOVIAL CYST EXCISION     left leg   TONSILLECTOMY  1957   UPPER GASTROINTESTINAL ENDOSCOPY     last 2018    Outpatient Medications Prior to Visit  Medication Sig Dispense Refill  amLODipine (NORVASC) 2.5 MG tablet TAKE 1 TABLET BY MOUTH EVERY DAY 90 tablet 2   aspirin 81 MG tablet Take 81 mg by mouth daily. Evening     fenofibrate 160 MG tablet TAKE 1 TABLET BY MOUTH EVERY DAY 90 tablet 1   letrozole (FEMARA) 2.5 MG tablet Take 1 tablet (2.5 mg total) by mouth daily. 90 tablet 3   loratadine (CLARITIN) 10 MG tablet Take 10 mg by mouth daily. Take 1/2 tablet daily     meclizine (ANTIVERT) 12.5 MG tablet Take 1 tablet (12.5 mg total) by mouth 3 (three) times daily as needed (off balance). 30 tablet 0   metoprolol tartrate (LOPRESSOR) 50 MG tablet TAKE 1/2 TABLET BY MOUTH 2 TIMES DAILY 90 tablet 1   montelukast (SINGULAIR) 10 MG tablet TAKE 1 TABLET BY MOUTH AT BEDTIME (Patient taking differently: 10 mg.) 90 tablet 1   ondansetron (ZOFRAN-ODT) 4 MG disintegrating tablet Take 1 tablet (4 mg total) by mouth every 8 (eight) hours as needed for nausea or vomiting. 15 tablet 0   pantoprazole (PROTONIX) 40 MG tablet TAKE 1  TABLET BY MOUTH EVERY DAY 90 tablet 1   Vitamin D, Cholecalciferol, 1000 units TABS Take by mouth daily. Morning     famotidine (PEPCID) 10 MG tablet Take 1 tablet (10 mg total) by mouth 2 (two) times daily. (Patient not taking: Reported on 01/12/2021) 60 tablet 0   No facility-administered medications prior to visit.    Allergies  Allergen Reactions   Hydrocodone-Acetaminophen     VOMITING   Azithromycin Other (See Comments)    Thrush    Klonopin [Clonazepam]     -august 27th- took one dose and experienced zombie like sensation- states will never take agian   Neomycin    Nitrofurantoin     Reports tingling on tongue- no swelling   Penicillins     REACTION: Arm swelling   Pneumococcal Vaccine Polyvalent     ARM REDNESS WITH TENDERNESS   Pneumovax [Pneumococcal Polysaccharide Vaccine]         Objective:    Physical Exam Vitals and nursing note reviewed.  Constitutional:      Appearance: Normal appearance.  HENT:     Right Ear: Tympanic membrane and ear canal normal. No swelling or tenderness.     Left Ear: Tympanic membrane and ear canal normal.  Cardiovascular:     Rate and Rhythm: Normal rate and regular rhythm.  Pulmonary:     Effort: Pulmonary effort is normal.     Breath sounds: Normal breath sounds.  Musculoskeletal:        General: Normal range of motion.  Skin:    General: Skin is warm and dry.  Neurological:     Mental Status: She is alert.  Psychiatric:        Mood and Affect: Mood normal.        Behavior: Behavior normal.    BP (!) 150/68   Pulse 74   Temp 98 F (36.7 C) (Temporal)   Ht $R'5\' 6"'bR$  (1.676 m)   Wt 115 lb 9.6 oz (52.4 kg)   SpO2 97%   BMI 18.66 kg/m  Wt Readings from Last 3 Encounters:  02/27/21 115 lb 9.6 oz (52.4 kg)  02/20/21 113 lb 12.8 oz (51.6 kg)  02/18/21 115 lb 14.4 oz (52.6 kg)       Assessment & Plan:   Problem List Items Addressed This Visit       Nervous and Auditory   Abnormal  ear sensation, right - Primary     Occurred 3 days ago, but better today. Reports having a popping sensation and then a hollow sound when opening or closing her jaw. Very vague. Also describes a sensation over right eye and right side of face. Cranial nerves all intact. Pt is followed by ENT, advised scheduling a visit if this occurs again.

## 2021-02-27 NOTE — Assessment & Plan Note (Signed)
Occurred 3 days ago, but better today. Reports having a popping sensation and then a hollow sound when opening or closing her jaw. Very vague. Also describes a sensation over right eye and right side of face. Cranial nerves all intact. Pt is followed by ENT, advised scheduling a visit if this occurs again.

## 2021-03-05 ENCOUNTER — Other Ambulatory Visit: Payer: Self-pay | Admitting: Family Medicine

## 2021-03-05 ENCOUNTER — Other Ambulatory Visit: Payer: Medicare Other

## 2021-03-09 NOTE — Progress Notes (Signed)
Subjective:    CC: L shoulder and L mid-back pain  I, Molly Weber, LAT, ATC, am serving as scribe for Dr. Lynne Leader.  HPI: Pt is a 80 y/o female presenting w/ L shoulder and L mid-back pain.  L shoulder pain ongoing since the autumn. Pt was working w/ some mulch and got her foot caught up, falling and landing on her L side. Pt is L-hand dominate. Pt locates pain to all over L GH joint, esp over the posterior aspect, radiating in L arm and hand. -Radiating pain: yes into her L arm -Neck pain: no -Aggravating factors: donning coat; L shoulder AROM above x deg; IR -Treatments tried: none  L mid-back pain: Chronic issue since 2015 but worsening over the last x weeks/months.  Locates pain to lateral border of her L scapula. -Radiating pain: yes -Aggravating factors: standing and doing activity -Treatments tried: massaging recliner  Additionally she has bilateral knee pain thought to be due to DJD.  She notes that she has had steroid injection in the past which helped a little bit.  She notes that she is also taking letrozole for breast cancer which is increased her general body aches and pain and she thinks may be responsible for her knee pain.  Pertinent review of Systems: No fevers or chills  Relevant historical information: Rainouts disease.  Barrett's esophagitis.  History of breast cancer.   Objective:    Vitals:   03/12/21 1001  BP: (!) 156/84  Pulse: 64  SpO2: 98%   General: Well Developed, well nourished, and in no acute distress.   MSK:  Left shoulder normal-appearing Nontender. Range of motion abduction 130 degrees.  Internal rotation lumbar spine.  External rotation full. Strength abduction 4/5 external rotation 4/5 internal rotation 5/5. Positive Hawkins and Neer's test. Negative Yergason's and speeds test.  T-spine nontender midline normal thoracic motion.  Some pain with scapular motion.  Right knee bossing at medial joint line.  Genu valgus  deformity.  Tender palpation medial and lateral joint line. Normal motion with crepitation.  Intact strength. Some laxity with LCL stress test.  Left knee bossing and medial joint line.  Genu valgus deformity.  Tender palpation medial lateral joint line. Normal motion crepitation.  Intact strength.  Some laxity with MCL stress test.  Lab and Radiology Results  X-ray images left shoulder, T-spine, and bilateral knees obtained today personally and independently interpreted  Left shoulder: Mild AC DJD.  No severe glenohumeral DJD.  No acute fractures.  Aorta atherosclerosis present.  T-spine: Mild compression fracture around T10 suspected.  Right knee: Severe lateral compartment DJD.  Mild medial compartment DJD.  No acute fractures.  Left knee: Severe medial compartment DJD.  No acute fractures.  Await formal radiology review    Impression and Recommendations:    Assessment and Plan: 80 y.o. female with left shoulder pain thought to be due to rotator cuff injury.  She is an excellent candidate for physical therapy.  Plan for physical therapy trial.  Recheck with me in about 6 weeks.  Left thoracic/periscapular pain.  Concern for an age-indeterminate compression fracture at T10.  This may be related him not certain.  Radiology overread still pending.  Physical therapy still reasonable as she is not acutely painful at the midline of her thoracic spine.  If radiology agrees with this is a concerning for compression fracture may need advanced imaging of thoracic spine to determine acuity.  Bilateral knee pain due to DJD.  Recommend Voltaren gel  and quad strengthening.  Consider steroid or hyaluronic acid injections.  Recheck in about 6 weeks.Marland Kitchen  PDMP not reviewed this encounter. Orders Placed This Encounter  Procedures   DG Shoulder Left    Standing Status:   Future    Number of Occurrences:   1    Standing Expiration Date:   03/12/2022    Order Specific Question:   Reason for Exam  (SYMPTOM  OR DIAGNOSIS REQUIRED)    Answer:   left shoulder pain    Order Specific Question:   Preferred imaging location?    Answer:   [facility]   DG Thoracic Spine W/Swimmers    Standing Status:   Future    Number of Occurrences:   1    Standing Expiration Date:   03/12/2022    Order Specific Question:   Reason for Exam (SYMPTOM  OR DIAGNOSIS REQUIRED)    Answer:   mid back pain    Order Specific Question:   Preferred imaging location?    Answer:   [facility]   DG Knee AP/LAT W/Sun[place] Right    Standing Status:   Future    Number of Occurrences:   1    Standing Expiration Date:   03/12/2022    Order Specific Question:   Reason for Exam (SYMPTOM  OR DIAGNOSIS REQUIRED)    Answer:   right knee pain    Order Specific Question:   Preferred imaging location?    Answer:   [facility]   DG Knee AP/LAT W/Sunrise Left    Standing Status:   Future    Number of Occurrences:   1    Standing Expiration Date:   03/12/2022    Order Specific Question:   Reason for Exam (SYMPTOM  OR DIAGNOSIS REQUIRED)    Answer:   left knee pain    Order Specific Question:   Preferred imaging location?    Answer:   [facility]   Ambulatory referral to Physical Therapy    Referral Priority:   Routine    Referral Type:   Physical Medicine    Referral Reason:   Specialty Services Required    Requested Specialty:   Physical Therapy    Number of Visits Requested:   1   No orders of the defined types were placed in this encounter.   Discussed warning signs or symptoms. Please see discharge instructions. Patient expresses understanding.   The above documentation has been reviewed and is accurate and complete Lynne Leader, M.D.

## 2021-03-12 ENCOUNTER — Other Ambulatory Visit: Payer: Self-pay

## 2021-03-12 ENCOUNTER — Ambulatory Visit: Payer: Medicare Other | Admitting: Family Medicine

## 2021-03-12 ENCOUNTER — Ambulatory Visit: Payer: Self-pay

## 2021-03-12 ENCOUNTER — Ambulatory Visit (INDEPENDENT_AMBULATORY_CARE_PROVIDER_SITE_OTHER): Payer: Medicare Other

## 2021-03-12 VITALS — BP 156/84 | HR 64 | Ht 66.0 in | Wt 114.6 lb

## 2021-03-12 DIAGNOSIS — M25562 Pain in left knee: Secondary | ICD-10-CM | POA: Diagnosis not present

## 2021-03-12 DIAGNOSIS — M7989 Other specified soft tissue disorders: Secondary | ICD-10-CM | POA: Diagnosis not present

## 2021-03-12 DIAGNOSIS — M25512 Pain in left shoulder: Secondary | ICD-10-CM

## 2021-03-12 DIAGNOSIS — M25561 Pain in right knee: Secondary | ICD-10-CM

## 2021-03-12 DIAGNOSIS — G8929 Other chronic pain: Secondary | ICD-10-CM | POA: Diagnosis not present

## 2021-03-12 DIAGNOSIS — M546 Pain in thoracic spine: Secondary | ICD-10-CM | POA: Diagnosis not present

## 2021-03-12 DIAGNOSIS — M19012 Primary osteoarthritis, left shoulder: Secondary | ICD-10-CM | POA: Diagnosis not present

## 2021-03-12 DIAGNOSIS — M1711 Unilateral primary osteoarthritis, right knee: Secondary | ICD-10-CM | POA: Diagnosis not present

## 2021-03-12 NOTE — Patient Instructions (Addendum)
Thank you for coming in today.   Please get an Xray today before you leave   I've referred you to Physical Therapy.  Let us know if you don't hear from them in one week.   Please use Voltaren gel (Generic Diclofenac Gel) up to 4x daily for pain as needed.  This is available over-the-counter as both the name brand Voltaren gel and the generic diclofenac gel.   Recheck back in 6 weeks

## 2021-03-13 NOTE — Progress Notes (Signed)
Left knee x-ray shows severe arthritis changes.

## 2021-03-13 NOTE — Progress Notes (Signed)
No compression fractures seen thoracic spine.

## 2021-03-13 NOTE — Progress Notes (Signed)
Left shoulder x-ray shows some arthritis changes of the main shoulder joint rated more mild.  Personally I believe the source of your pain is from the rotator cuff and not from the shoulder arthritis.

## 2021-03-13 NOTE — Progress Notes (Signed)
Right knee x-ray shows severe arthritis changes.

## 2021-03-30 ENCOUNTER — Ambulatory Visit (INDEPENDENT_AMBULATORY_CARE_PROVIDER_SITE_OTHER)
Admission: RE | Admit: 2021-03-30 | Discharge: 2021-03-30 | Disposition: A | Discharge status: Home / Self Care | Payer: Medicare Other | Source: Ambulatory Visit | Attending: Family Medicine | Admitting: Family Medicine

## 2021-03-30 ENCOUNTER — Other Ambulatory Visit: Payer: Self-pay

## 2021-03-30 DIAGNOSIS — M816 Localized osteoporosis [Lequesne]: Secondary | ICD-10-CM | POA: Diagnosis not present

## 2021-04-02 ENCOUNTER — Ambulatory Visit: Payer: Medicare Other | Admitting: Physical Therapy

## 2021-04-02 DIAGNOSIS — M25561 Pain in right knee: Secondary | ICD-10-CM

## 2021-04-02 DIAGNOSIS — G8929 Other chronic pain: Secondary | ICD-10-CM

## 2021-04-02 DIAGNOSIS — M25562 Pain in left knee: Secondary | ICD-10-CM

## 2021-04-02 DIAGNOSIS — M25512 Pain in left shoulder: Secondary | ICD-10-CM

## 2021-04-02 NOTE — Therapy (Signed)
OUTPATIENT PHYSICAL THERAPY SHOULDER EVALUATION   Patient Name: Lori [name] MRN: [id number] DOB:12-25-1940, 81 y.o., female Today's Date: 04/02/21   PT End of Session - 04/08/21 0947     Visit Number 1    Number of Visits 12    Date for PT Re-Evaluation 05/14/21    Authorization Type [employer] Medicare    PT Start Time 4098    PT Stop Time 1600    PT Time Calculation (min) 45 min    Activity Tolerance Patient tolerated treatment well    Behavior During Therapy WFL for tasks assessed/performed             Past Medical History:  Diagnosis Date   Allergy    Anemia    past hx of anemia   Aneurysm (Lori [name]    pseudo-aneurym of carotid arteries per pt   Anxiety    Aortic atherosclerosis (HCC)    Arthritis    knee- DJD    Barrett's esophagus    Breast cancer (Lori [name]    right breast IDC   Burning mouth syndrome    Dr Redmond Baseman 11-2016 - no smell or taste x 4 yrs per pt    Cataract    bilateral    Clotting disorder (Lori [name] 1988   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    Eczema    Family history of breast cancer    Family history of kidney cancer    Family history of multiple myeloma    Family history of thyroid cancer    GERD (gastroesophageal reflux disease)    Headache(784.0)    History of IBS    History of kidney stones    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    Hyperlipidemia    on medication   Hypertension    Low back pain    Meniere disease    Neuromuscular disorder (Lori [name]    raynaud's   Osteopenia    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Lori [name]    Stroke (Lori [name] 1988   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 carotid artery dissection    Tubular adenoma of colon 02/2013   Varicose veins with inflammation    upper and lower per pt    Vasculitis (Lori [name]    Past Surgical History:  Procedure Laterality Date   arthroscopic knee  2009   left knee/ torn meniscus   BREAST LUMPECTOMY WITH RADIOACTIVE SEED LOCALIZATION Right 11/05/2020   Procedure: RIGHT BREAST LUMPECTOMY WITH  RADIOACTIVE SEED LOCALIZATION;  Surgeon: Lori Luna, MD;  Location: [facility];  Service: General;  Laterality: Right;   BUNIONECTOMY Right 1998   with other foot surgery    carotid artery disection  1988   Carotid Artery Dissection   CATARACT EXTRACTION, BILATERAL  07-18-2017,08-08-2017   CESAREAN SECTION  1988   1 time   COLONOSCOPY  2019   last 2019   [place] OF UTERUS  2004   POLYPECTOMY     POPLITEAL SYNOVIAL CYST EXCISION     left leg   TONSILLECTOMY  1957   UPPER GASTROINTESTINAL ENDOSCOPY     last 2018   Patient Active Problem List   Diagnosis Date Noted   Abnormal ear sensation, right 02/27/2021   Genetic testing 11/10/2020   Family history of kidney cancer 10/22/2020   Family history of thyroid cancer 10/22/2020   Family history of multiple myeloma 10/22/2020   Family history of breast cancer 10/22/2020   Malignant neoplasm of lower-inner quadrant of right breast of female,  estrogen receptor positive (Lori [name] 10/17/2020   Parotid adenoma 06/26/2020   IBS (irritable bowel syndrome) 06/26/2019   Osteoarthritis of left knee 09/15/2017   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   Aortic atherosclerosis (Lori [name] 02/11/2016   Hypertension 11/29/2013   Eczema 11/29/2013   Hyperlipidemia 11/29/2013   Barrett's esophagus 08/24/2013   GERD (gastroesophageal reflux disease) 05/16/2013   Vitamin D deficiency 01/29/2012   Solitary pulmonary nodule 06/08/2011   HIATAL HERNIA WITH REFLUX 02/06/2010   DEGENERATIVE JOINT DISEASE, KNEE 03/14/2008   VARICOSE VEINS LOWER EXTREMITIES W/INFLAMMATION 03/09/2007   Allergic rhinitis 03/09/2007   ACTINIC KERATOSIS, FOREHEAD, LEFT 03/09/2007   Headache(784.0) 03/09/2007   MENIERE'S DISEASE 09/22/2006   RAYNAUD'S DISEASE 09/22/2006   Osteoporosis 09/22/2006    PCP: Lori Olp, MD  REFERRING PROVIDER: [name], MD  REFERRING DIAG: [phone] (ICD-10-CM) - Chronic left shoulder pain  THERAPY DIAG:   Acute pain of left shoulder  Chronic pain of left knee  Acute pain of right knee   SUBJECTIVE:                                                                                                                                                                                      SUBJECTIVE STATEMENT: Patient complains of left shoulder pain since last autumn when she fell outside when her foot got caught up when she was working with mulch. She landed on her left side and is left side dominant.  No injection. Pt also reports pain in bil knees, 2009 surgery for meniscus. R knee is also hurting. Is able to walk, 30 min, 5x/wk. Lives on 1 story, townhouse, 3 steps to enter.   PERTINENT HISTORY: Osteopenia, borderline osteoporosis, hx LBP, Breast CA , Hx of knee surgery, varicose veins,    PAIN:  Are you having pain? Yes NPRS scale: 8/10 Pain location: shoulder  Pain orientation: Left  PAIN TYPE: aching Pain description: intermittent  Aggravating factors: reaching, lifting, use.  Relieving factors: rest   Are you having pain? Yes NPRS scale: 8/10 Pain location: Knees  Pain orientation: Right and Left  PAIN TYPE: aching Pain description: intermittent  Aggravating factors: standing, walking, sitting too long  Relieving factors: none stated   PRECAUTIONS: None  WEIGHT BEARING RESTRICTIONS No  FALLS:  Has patient fallen in last 6 months? Yes Number of falls: 1 . Is pt reluctant to leave house: No, Has pt had decreased activity level? No   OCCUPATION: retired  PLOF: Independent  PATIENT GOALS: decreased pain in L shoulder and bil knees.   OBJECTIVE:   DIAGNOSTIC FINDINGS:  EXAM: 03/12/21 Xray LEFT SHOULDER - 2+ VIEW IMPRESSION: Mild to moderate severity degenerative  changes of the left shoulder.EXAM:  THORACIC SPINE - 3 VIEWS  IMPRESSION: Negative.   COGNITION:  Overall cognitive status: Within functional limits for tasks assessed      POSTURE: L knee valgus, R  knee varus.   PALPATION:   UPPER EXTREMITY AROM/PROM:  A/PROM Right 04/08/2021 Left 04/08/2021  Shoulder flexion wnl/155 140/145  Shoulder extension    Shoulder abduction wnl 105/  Shoulder adduction    Shoulder internal rotation  wnl  Shoulder external rotation  Wnl , pain at end range           LE ROM    L knee Flex  125  L knee Ext   -10  R knee Flex  135  R knee Ext   0      (Blank rows = not tested)  UPPER EXTREMITY MMT:  MMT Right 04/08/2021 Left 04/08/2021  Shoulder flexion  4-  Shoulder extension    Shoulder abduction  4-  Shoulder adduction    IR  4+  ER   4                                   Grip strength (lbs)    (Blank rows = not tested)  SHOULDER SPECIAL TESTS:   JOINT MOBILITY TESTING:  Hypomobile L knee,   PALPATION:    TODAY'S TREATMENT:  04/02/21: Evaluation, Pt education    PATIENT EDUCATION: PATIENT EDUCATION:  Education details: on current presentation, on HEP, on clinical outcomes score and POC Person educated: Patient Education method: Programmer, multimedia, Facilities manager, and Handouts Education comprehension: verbalized understanding   HOME EXERCISE PROGRAM: N/A  ASSESSMENT:  CLINICAL IMPRESSION: 04/02/21:  Pt presents with with primary complaint of increased pain in L shoulder. She has mild weakness and pain with ER, and with difficulty with elevation. She has poor ability for reaching, lifting, carrying and ADLs. She also has ongoing pain in bil knees, consistent with OA. She has weakness and instability, and stiffness with lack of full ROM. She has decreased ability for walking, exercise, and stairs. Pt to benefit from skilled PT to improve deficits and pain.   Objective impairments include Abnormal gait, decreased activity tolerance, decreased balance, decreased knowledge of use of DME, decreased mobility, difficulty walking, decreased ROM, decreased strength, hypomobility, increased muscle spasms, impaired flexibility, impaired  UE functional use, improper body mechanics, and pain. These impairments are limiting patient from cleaning, community activity, driving, yard work, and shopping. Personal factors including 1 comorbidity: OA  are also affecting patient's functional outcome. Patient will benefit from skilled PT to address above impairments and improve overall function.  REHAB POTENTIAL: Good  CLINICAL DECISION MAKING: Stable/uncomplicated  EVALUATION COMPLEXITY: Low   GOALS: Goals reviewed with patient? Yes  SHORT TERM GOALS:  STG Name Target Date Goal status  1 Patient will be independent in initial HEP for knees and shoulder pain   04/16/21 INITIAL  2 Pt to report decreased pain in L shoulder to 0-4/10 with activity  04/16/21 INITIAL                            LONG TERM GOALS:   LTG Name Target Date Goal status  1 Patient to be independent with final HEP for knees and shoulder   05/14/21 INITIAL  2 Pt to report decreased pain in L shoulder, to 0-2/10 with activity.   05/14/21 INITIAL  3 Pt to demo improved strength of L shoulder to be at least 4+/5 for all motions , to improve ability for reaching, lifting, IADLS.   1/59/96 INITIAL  4 Pt to demo ability for stair climbing, reciprocal, with 1 HR, without pain greater than 3/10 in bil knees, to improve ability for community activity.   05/14/21 INITIAL  5                   PLAN: PT FREQUENCY: 1x/week  PT DURATION: 6 weeks  PLANNED INTERVENTIONS: Therapeutic exercises, Therapeutic activity, Neuro Muscular re-education, Balance training, Gait training, Patient/Family education, Joint mobilization, Canalith repositioning, Aquatic Therapy, Dry Needling, Electrical stimulation, Cryotherapy, Moist heat, Taping, Traction, Ionotophoresis 4mg /ml Dexamethasone, and Manual therapy  PLAN FOR NEXT SESSION:  educate on initial HEP for shoulders and knees.    Lyndee Hensen, PT, DPT 8:52 PM  04/08/21

## 2021-04-06 DIAGNOSIS — L814 Other melanin hyperpigmentation: Secondary | ICD-10-CM | POA: Diagnosis not present

## 2021-04-06 DIAGNOSIS — L570 Actinic keratosis: Secondary | ICD-10-CM | POA: Diagnosis not present

## 2021-04-06 DIAGNOSIS — L84 Corns and callosities: Secondary | ICD-10-CM | POA: Diagnosis not present

## 2021-04-08 ENCOUNTER — Ambulatory Visit: Payer: Medicare Other | Admitting: Physical Therapy

## 2021-04-08 ENCOUNTER — Encounter: Payer: Self-pay | Admitting: Physical Therapy

## 2021-04-08 DIAGNOSIS — M25562 Pain in left knee: Secondary | ICD-10-CM

## 2021-04-08 DIAGNOSIS — G8929 Other chronic pain: Secondary | ICD-10-CM

## 2021-04-08 DIAGNOSIS — M25512 Pain in left shoulder: Secondary | ICD-10-CM | POA: Diagnosis not present

## 2021-04-08 DIAGNOSIS — M25561 Pain in right knee: Secondary | ICD-10-CM | POA: Diagnosis not present

## 2021-04-08 NOTE — Therapy (Signed)
OUTPATIENT PHYSICAL THERAPY TREATMENT    Patient Name: Lori [name] MRN: [id number] DOB:[birth year], 81 y.o., female Today's Date: 04/08/21   PT End of Session - 04/08/21 2111     Visit Number 2    Number of Visits 12    Date for PT Re-Evaluation 05/14/21    Authorization Type [employer] Medicare    PT Start Time 0930    PT Stop Time 1016    PT Time Calculation (min) 46 min    Activity Tolerance Patient tolerated treatment well    Behavior During Therapy WFL for tasks assessed/performed             Past Medical History:  Diagnosis Date   Allergy    Anemia    past hx of anemia   Aneurysm ([place])    pseudo-aneurym of carotid arteries per pt   Anxiety    Aortic atherosclerosis (HCC)    Arthritis    knee- DJD    Barrett's esophagus    Breast cancer ([place])    right breast IDC   Burning mouth syndrome    Dr Redmond Baseman 11-2016 - no smell or taste x 4 yrs per pt    Cataract    bilateral    Clotting disorder ([place]) 1988   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    Eczema    Family history of breast cancer    Family history of kidney cancer    Family history of multiple myeloma    Family history of thyroid cancer    GERD (gastroesophageal reflux disease)    Headache(784.0)    History of IBS    History of kidney stones    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    Hyperlipidemia    on medication   Hypertension    Low back pain    Meniere disease    Neuromuscular disorder ([place])    raynaud's   Osteopenia    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[place])    Stroke ([place]) 1988   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 carotid artery dissection    Tubular adenoma of colon 02/2013   Varicose veins with inflammation    upper and lower per pt    Vasculitis ([place])    Past Surgical History:  Procedure Laterality Date   arthroscopic knee  2009   left knee/ torn meniscus   BREAST LUMPECTOMY WITH RADIOACTIVE SEED LOCALIZATION Right 11/05/2020   Procedure: RIGHT BREAST LUMPECTOMY WITH RADIOACTIVE SEED  LOCALIZATION;  Surgeon: Erroll Luna, MD;  Location: [facility];  Service: General;  Laterality: Right;   BUNIONECTOMY Right 1998   with other foot surgery    carotid artery disection  1988   Carotid Artery Dissection   CATARACT EXTRACTION, BILATERAL  07-18-2017,08-08-2017   CESAREAN SECTION  1988   1 time   COLONOSCOPY  2019   last 2019   [place] OF UTERUS  2004   POLYPECTOMY     POPLITEAL SYNOVIAL CYST EXCISION     left leg   TONSILLECTOMY  1957   UPPER GASTROINTESTINAL ENDOSCOPY     last 2018   Patient Active Problem List   Diagnosis Date Noted   Abnormal ear sensation, right 02/27/2021   Genetic testing 11/10/2020   Family history of kidney cancer 10/22/2020   Family history of thyroid cancer 10/22/2020   Family history of multiple myeloma 10/22/2020   Family history of breast cancer 10/22/2020   Malignant neoplasm of lower-inner quadrant of right breast of female,  estrogen receptor positive ([place]) 10/17/2020   Parotid adenoma 06/26/2020   IBS (irritable bowel syndrome) 06/26/2019   Osteoarthritis of left knee 09/15/2017   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   Aortic atherosclerosis ([place]) 02/11/2016   Hypertension 11/29/2013   Eczema 11/29/2013   Hyperlipidemia 11/29/2013   Barrett's esophagus 08/24/2013   GERD (gastroesophageal reflux disease) 05/16/2013   Vitamin D deficiency 01/29/2012   Solitary pulmonary nodule 06/08/2011   HIATAL HERNIA WITH REFLUX 02/06/2010   DEGENERATIVE JOINT DISEASE, KNEE 03/14/2008   VARICOSE VEINS LOWER EXTREMITIES W/INFLAMMATION 03/09/2007   Allergic rhinitis 03/09/2007   ACTINIC KERATOSIS, FOREHEAD, LEFT 03/09/2007   Headache(784.0) 03/09/2007   MENIERE'S DISEASE 09/22/2006   RAYNAUD'S DISEASE 09/22/2006   Osteoporosis 09/22/2006    PCP: [name], MD  REFERRING PROVIDER: [name], MD  REFERRING DIAG: [phone] (ICD-10-CM) - Chronic left shoulder pain  THERAPY DIAG:  Acute pain of  left shoulder  Chronic pain of left knee  Acute pain of right knee   SUBJECTIVE:                                                                                                                                                                                      SUBJECTIVE STATEMENT: Pt states L shoulder is sore, maybe slightly better, and knees are sore.   PERTINENT HISTORY: Osteopenia, borderline osteoporosis, hx LBP, Breast CA , Hx of knee surgery, varicose veins,    PAIN:  Are you having pain? Yes NPRS scale: 6/10 Pain location: shoulder  Pain orientation: Left  PAIN TYPE: aching Pain description: intermittent  Aggravating factors: reaching, lifting, use.  Relieving factors: rest   Are you having pain? Yes NPRS scale: 8/10 Pain location: Knees  Pain orientation: Right and Left  PAIN TYPE: aching Pain description: intermittent  Aggravating factors: standing, walking, sitting too long  Relieving factors: none stated   PRECAUTIONS: None  WEIGHT BEARING RESTRICTIONS No   PLOF: Independent  PATIENT GOALS: decreased pain in L shoulder and bil knees.   OBJECTIVE:   DIAGNOSTIC FINDINGS:  EXAM: 03/12/21 Xray LEFT SHOULDER - 2+ VIEW IMPRESSION: Mild to moderate severity degenerative changes of the left shoulder.EXAM:  THORACIC SPINE - 3 VIEWS  IMPRESSION: Negative.   COGNITION:  Overall cognitive status: Within functional limits for tasks assessed      POSTURE: L knee valgus, R knee varus.    UPPER EXTREMITY AROM/PROM:  A/PROM Right 04/08/2021 Left 04/08/2021  Shoulder flexion wnl/155 140/145  Shoulder extension    Shoulder abduction wnl 105/  Shoulder adduction    Shoulder internal rotation  wnl  Shoulder external rotation  Wnl , pain at end range  LE ROM    L knee Flex  125  L knee Ext   -10  R knee Flex  135  R knee Ext   0      (Blank rows = not tested)  UPPER EXTREMITY MMT:  MMT Right 04/08/2021 Left 04/08/2021  Shoulder  flexion  4-  Shoulder extension    Shoulder abduction  4-  Shoulder adduction    IR  4+  ER   4                                   Grip strength (lbs)    (Blank rows = not tested)    JOINT MOBILITY TESTING:  Hypomobile L knee,      TODAY'S TREATMENT:   04/08/21:   Therapeutic Exercise:  Aerobic:  recumbent bike L1 x 8 min Supine:  Heel slides x 15 bil; Quad sets x 10 bil; SLR x 10 bil;  Shoulder flexion/cane x 10;  Seated:  bil shoulder ER YTB 2x10;  pulley, flex and abd x 2 min each; scap squeeze x 15;  Standing: Stretches: shoulder ER butterfly x 10;   Neuromuscular Re-education: Manual Therapy: Therapeutic Activity: Self Care: Trigger Point Dry Needling:  Modalities:     04/02/21: Evaluation, Pt education    PATIENT EDUCATION: PATIENT EDUCATION:  Education details: education on initial HEP,  Person educated: Patient Education method: Consulting civil engineer, Media planner, and Handouts Education comprehension: verbalized understanding, verbal cues, needs further instruction.    HOME EXERCISE PROGRAM: Access Code: [email or web address] URL: [email or web address] Date: 04/08/2021 Prepared by: Lyndee Hensen  Exercises Supine Heel Slide - 1 x daily - 1-2 sets - 10 reps Supine Quad Set - 1 x daily - 1-2 sets - 10 reps Straight Leg Raise - 1 x daily - 1 sets - 10 reps Seated Scapular Retraction - 2 x daily - 1 sets - 10 reps Seated Bilateral Shoulder External Rotation with Resistance - 1 x daily - 1 sets - 10 reps   ASSESSMENT:  CLINICAL IMPRESSION: 04/08/21:  Pt educated on initial HEP today. She has mild soreness with full knee Rom and extension today, but did well with ther ex. Noted weakness and soreness with ER at end range. Plan to progress ROM and strength as tolerated.   Objective impairments include Abnormal gait, decreased activity tolerance, decreased balance, decreased knowledge of use of DME, decreased mobility, difficulty walking, decreased  ROM, decreased strength, hypomobility, increased muscle spasms, impaired flexibility, impaired UE functional use, improper body mechanics, and pain. These impairments are limiting patient from cleaning, community activity, driving, yard work, and shopping. Personal factors including 1 comorbidity: OA  are also affecting patient's functional outcome. Patient will benefit from skilled PT to address above impairments and improve overall function.  REHAB POTENTIAL: Good  CLINICAL DECISION MAKING: Stable/uncomplicated  EVALUATION COMPLEXITY: Low   GOALS: Goals reviewed with patient? Yes  SHORT TERM GOALS:  STG Name Target Date Goal status  1 Patient will be independent in initial HEP for knees and shoulder pain   04/16/21 INITIAL  2 Pt to report decreased pain in L shoulder to 0-4/10 with activity  04/16/21 INITIAL                            LONG TERM GOALS:   LTG Name Target Date Goal status  1 Patient to be independent with final HEP for knees  and shoulder   05/14/21 INITIAL  2 Pt to report decreased pain in L shoulder, to 0-2/10 with activity.   05/14/21 INITIAL  3 Pt to demo improved strength of L shoulder to be at least 4+/5 for all motions , to improve ability for reaching, lifting, IADLS.   6/97/94 INITIAL  4 Pt to demo ability for stair climbing, reciprocal, with 1 HR, without pain greater than 3/10 in bil knees, to improve ability for community activity.   05/14/21 INITIAL  5                   PLAN: PT FREQUENCY: 1x/week  PT DURATION: 6 weeks  PLANNED INTERVENTIONS: Therapeutic exercises, Therapeutic activity, Neuro Muscular re-education, Balance training, Gait training, Patient/Family education, Joint mobilization, Canalith repositioning, Aquatic Therapy, Dry Needling, Electrical stimulation, Cryotherapy, Moist heat, Taping, Traction, Ionotophoresis 4mg /ml Dexamethasone, and Manual therapy  PLAN FOR NEXT SESSION:  educate on initial HEP for shoulders and knees.    Lyndee Hensen, PT, DPT 9:18 PM  04/08/21

## 2021-04-15 ENCOUNTER — Other Ambulatory Visit: Payer: Self-pay

## 2021-04-15 ENCOUNTER — Ambulatory Visit: Payer: Medicare Other | Admitting: Physical Therapy

## 2021-04-15 ENCOUNTER — Encounter: Payer: Self-pay | Admitting: Physical Therapy

## 2021-04-15 DIAGNOSIS — M25562 Pain in left knee: Secondary | ICD-10-CM

## 2021-04-15 DIAGNOSIS — M6281 Muscle weakness (generalized): Secondary | ICD-10-CM

## 2021-04-15 DIAGNOSIS — G8929 Other chronic pain: Secondary | ICD-10-CM | POA: Diagnosis not present

## 2021-04-15 DIAGNOSIS — M25512 Pain in left shoulder: Secondary | ICD-10-CM

## 2021-04-15 DIAGNOSIS — M25561 Pain in right knee: Secondary | ICD-10-CM

## 2021-04-15 NOTE — Therapy (Signed)
OUTPATIENT PHYSICAL THERAPY TREATMENT    Patient Name: Lori [name] MRN: [id number] DOB:28-Jul-1940, 81 y.o., female Today's Date: 04/08/21   PT End of Session - 04/15/21 0836     Visit Number 3    Number of Visits 12    Date for PT Re-Evaluation 05/14/21    Authorization Type [employer] Medicare    PT Start Time 6945    PT Stop Time 0927    PT Time Calculation (min) 40 min    Activity Tolerance Patient tolerated treatment well    Behavior During Therapy WFL for tasks assessed/performed             Past Medical History:  Diagnosis Date   Allergy    Anemia    past hx of anemia   Aneurysm ([place])    pseudo-aneurym of carotid arteries per pt   Anxiety    Aortic atherosclerosis (HCC)    Arthritis    knee- DJD    Barrett's esophagus    Breast cancer ([place])    right breast IDC   Burning mouth syndrome    Dr Redmond Baseman 11-2016 - no smell or taste x 4 yrs per pt    Cataract    bilateral    Clotting 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    Eczema    Family history of breast cancer    Family history of kidney cancer    Family history of multiple myeloma    Family history of thyroid cancer    GERD (gastroesophageal reflux disease)    Headache(784.0)    History of IBS    History of kidney stones    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    Hyperlipidemia    on medication   Hypertension    Low back pain    Meniere disease    Neuromuscular disorder ([place])    raynaud's   Osteopenia    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[place])    Stroke ([place]) 1988   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 carotid artery dissection    Tubular adenoma of colon 02/2013   Varicose veins with inflammation    upper and lower per pt    Vasculitis ([place])    Past Surgical History:  Procedure Laterality Date   arthroscopic knee  2009   left knee/ torn meniscus   BREAST LUMPECTOMY WITH RADIOACTIVE SEED LOCALIZATION Right 11/05/2020   Procedure: RIGHT BREAST LUMPECTOMY WITH RADIOACTIVE SEED  LOCALIZATION;  Surgeon: Erroll Luna, MD;  Location: [facility];  Service: General;  Laterality: Right;   BUNIONECTOMY Right 1998   with other foot surgery    carotid artery disection  1988   Carotid Artery Dissection   CATARACT EXTRACTION, BILATERAL  07-18-2017,08-08-2017   CESAREAN SECTION  1988   1 time   COLONOSCOPY  2019   last 2019   [place] OF UTERUS  2004   POLYPECTOMY     POPLITEAL SYNOVIAL CYST EXCISION     left leg   TONSILLECTOMY  1957   UPPER GASTROINTESTINAL ENDOSCOPY     last 2018   Patient Active Problem List   Diagnosis Date Noted   Abnormal ear sensation, right 02/27/2021   Genetic testing 11/10/2020   Family history of kidney cancer 10/22/2020   Family history of thyroid cancer 10/22/2020   Family history of multiple myeloma 10/22/2020   Family history of breast cancer 10/22/2020   Malignant neoplasm of lower-inner quadrant of right breast of female,  estrogen receptor positive ([place]) 10/17/2020   Parotid adenoma 06/26/2020   IBS (irritable bowel syndrome) 06/26/2019   Osteoarthritis of left knee 09/15/2017   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   Aortic atherosclerosis ([place]) 02/11/2016   Hypertension 11/29/2013   Eczema 11/29/2013   Hyperlipidemia 11/29/2013   Barrett's esophagus 08/24/2013   GERD (gastroesophageal reflux disease) 05/16/2013   Vitamin D deficiency 01/29/2012   Solitary pulmonary nodule 06/08/2011   HIATAL HERNIA WITH REFLUX 02/06/2010   DEGENERATIVE JOINT DISEASE, KNEE 03/14/2008   VARICOSE VEINS LOWER EXTREMITIES W/INFLAMMATION 03/09/2007   Allergic rhinitis 03/09/2007   ACTINIC KERATOSIS, FOREHEAD, LEFT 03/09/2007   Headache(784.0) 03/09/2007   MENIERE'S DISEASE 09/22/2006   RAYNAUD'S DISEASE 09/22/2006   Osteoporosis 09/22/2006    PCP: [name], MD  REFERRING PROVIDER: [name], MD  REFERRING DIAG: [phone] (ICD-10-CM) - Chronic left shoulder pain  THERAPY DIAG:  Chronic pain of  left knee  Muscle weakness (generalized)  Acute pain of left shoulder  Acute pain of right knee   SUBJECTIVE:                                                                                                                                                                                      SUBJECTIVE STATEMENT: Pt states that she has been doing her exercises but she doesn't know if she is doing her theraband exercise correctly as she had a lot of pain afterwards. States she has been focusing on her posture and keeping her shoulder back.   PERTINENT HISTORY: Osteopenia, borderline osteoporosis, hx LBP, Breast CA , Hx of knee surgery, varicose veins,    PAIN:  Are you having pain? Yes NPRS scale: 6/10 Pain location: shoulder  Pain orientation: Left  PAIN TYPE: aching Pain description: intermittent  Aggravating factors: reaching, lifting, use.  Relieving factors: rest   Are you having pain? Yes NPRS scale: 8/10 Pain location: Knees  Pain orientation: Right and Left  PAIN TYPE: aching Pain description: intermittent  Aggravating factors: standing, walking, sitting too long  Relieving factors: none stated   PRECAUTIONS: None  WEIGHT BEARING RESTRICTIONS No   PLOF: Independent  PATIENT GOALS: decreased pain in L shoulder and bil knees.   OBJECTIVE:   DIAGNOSTIC FINDINGS:  EXAM: 03/12/21 Xray LEFT SHOULDER - 2+ VIEW IMPRESSION: Mild to moderate severity degenerative changes of the left shoulder.EXAM:  THORACIC SPINE - 3 VIEWS  IMPRESSION: Negative.   COGNITION:  Overall cognitive status: Within functional limits for tasks assessed      POSTURE: L knee valgus, R knee varus.    UPPER EXTREMITY AROM/PROM:  A/PROM Right 04/15/2021 Left 04/15/2021  Shoulder flexion wnl/155 140/145  Shoulder  extension    Shoulder abduction wnl 105/  Shoulder adduction    Shoulder internal rotation  wnl  Shoulder external rotation  Wnl , pain at end range           LE ROM     L knee Flex  125  L knee Ext   -10  R knee Flex  135  R knee Ext   0      (Blank rows = not tested)  UPPER EXTREMITY MMT:  MMT Right 04/15/2021 Left 04/15/2021  Shoulder flexion  4-  Shoulder extension    Shoulder abduction  4-  Shoulder adduction    IR  4+  ER   4                                   Grip strength (lbs)    (Blank rows = not tested)    JOINT MOBILITY TESTING:  Hypomobile L knee,      TODAY'S TREATMENT:  04/15/21 Seated: LAQs 2x10 5" holds; shoulder ER with towels and lumbar support red theraband 3x8 bilat, knee AROM seated x15 5" holds B, sit to stand - cues to shift weight forward and keep knees over feet (collapses into valgus knee 4x5 Standing: wall slides flexion 3x10 Bilat  04/08/21:   Therapeutic Exercise:  Aerobic:  recumbent bike L1 x 8 min Supine:  Heel slides x 15 bil; Quad sets x 10 bil; SLR x 10 bil;  Shoulder flexion/cane x 10;  Seated:  bil shoulder ER YTB 2x10;  pulley, flex and abd x 2 min each; scap squeeze x 15;  Standing: Stretches: shoulder ER butterfly x 10;   Neuromuscular Re-education: Manual Therapy: Therapeutic Activity: Self Care: Trigger Point Dry Needling:  Modalities:     04/02/21: Evaluation, Pt education    PATIENT EDUCATION: PATIENT EDUCATION:  Education details: on rationale for HEP, on post exercise soreness, on stiffness with immobility Person educated: Patient Education method: Explanation, Demonstration, and Handouts Education comprehension: verbalized understanding, verbal cues, needs further instruction.    HOME EXERCISE PROGRAM: Access Code: [email or web address] URL: [email or web address] Date: 04/08/2021 Prepared by: Sedalia Muta    ASSESSMENT:  CLINICAL IMPRESSION: 04/15/21 Continued with progression of exercises and educated patient in rationale for exercises and on anticipated post exercise soreness. Tolerated all exercises well and reduced pain with increased repetition with  shoulder slides up wall. Discussed sprinkling exercises in throughout the day to help with stiffness and to reduce overall time spent all at once performing entire exercise program. Will continue with current POC.   Objective impairments include Abnormal gait, decreased activity tolerance, decreased balance, decreased knowledge of use of DME, decreased mobility, difficulty walking, decreased ROM, decreased strength, hypomobility, increased muscle spasms, impaired flexibility, impaired UE functional use, improper body mechanics, and pain. These impairments are limiting patient from cleaning, community activity, driving, yard work, and shopping. Personal factors including 1 comorbidity: OA  are also affecting patient's functional outcome. Patient will benefit from skilled PT to address above impairments and improve overall function.  REHAB POTENTIAL: Good  CLINICAL DECISION MAKING: Stable/uncomplicated  EVALUATION COMPLEXITY: Low   GOALS: Goals reviewed with patient? Yes  SHORT TERM GOALS:  STG Name Target Date Goal status  1 Patient will be independent in initial HEP for knees and shoulder pain   04/16/21 INITIAL  2 Pt to report decreased pain in L shoulder to 0-4/10 with activity  04/16/21 INITIAL  LONG TERM GOALS:   LTG Name Target Date Goal status  1 Patient to be independent with final HEP for knees and shoulder   05/14/21 INITIAL  2 Pt to report decreased pain in L shoulder, to 0-2/10 with activity.   05/14/21 INITIAL  3 Pt to demo improved strength of L shoulder to be at least 4+/5 for all motions , to improve ability for reaching, lifting, IADLS.   4/45/14 INITIAL  4 Pt to demo ability for stair climbing, reciprocal, with 1 HR, without pain greater than 3/10 in bil knees, to improve ability for community activity.   05/14/21 INITIAL  5                   PLAN: PT FREQUENCY: 1x/week  PT DURATION: 6 weeks  PLANNED INTERVENTIONS: Therapeutic  exercises, Therapeutic activity, Neuro Muscular re-education, Balance training, Gait training, Patient/Family education, Joint mobilization, Canalith repositioning, Aquatic Therapy, Dry Needling, Electrical stimulation, Cryotherapy, Moist heat, Taping, Traction, Ionotophoresis 4mg /ml Dexamethasone, and Manual therapy  PLAN FOR NEXT SESSION:  educate on initial HEP for shoulders and knees.   Lyndee Hensen, PT, DPT 9:42 AM  04/15/21

## 2021-04-17 ENCOUNTER — Ambulatory Visit (INDEPENDENT_AMBULATORY_CARE_PROVIDER_SITE_OTHER): Payer: Medicare Other | Admitting: Family Medicine

## 2021-04-17 ENCOUNTER — Encounter: Payer: Self-pay | Admitting: Family Medicine

## 2021-04-17 VITALS — Temp 98.3°F | Ht 66.0 in

## 2021-04-17 DIAGNOSIS — R0981 Nasal congestion: Secondary | ICD-10-CM

## 2021-04-17 DIAGNOSIS — U071 COVID-19: Secondary | ICD-10-CM

## 2021-04-17 LAB — POC COVID19 BINAXNOW: SARS Coronavirus 2 Ag: POSITIVE — AB

## 2021-04-17 MED ORDER — BENZONATATE 100 MG PO CAPS: 100.0000 mg | ORAL_CAPSULE | Freq: Three times a day (TID) | ORAL | 0 refills | Status: DC | PRN

## 2021-04-17 MED ORDER — MOLNUPIRAVIR EUA 200MG CAPSULE: 4.0000 | ORAL_CAPSULE | Freq: Two times a day (BID) | ORAL | 0 refills | Status: AC

## 2021-04-17 NOTE — Progress Notes (Signed)
Virtual telephone visit    Virtual Visit via Telephone Note   This visit type was conducted due to national recommendations for restrictions regarding the COVID-19 Pandemic (e.g. social distancing) in an effort to limit this patient's exposure and mitigate transmission in our community. Due to her co-morbid illnesses, this patient is at least at moderate risk for complications without adequate follow up. This format is felt to be most appropriate for this patient at this time. The patient did not have access to video technology or had technical difficulties with video requiring transitioning to audio format only (telephone). Physical exam was limited to content and character of the telephone converstion. i was able to get the patient set up on a telephone visit.   Patient location: in car Patient and provider in visit Provider location: Office  I discussed the limitations of evaluation and management by telemedicine and the availability of in person appointments. The patient expressed understanding and agreed to proceed.   Visit Date: 04/17/2021  Today's healthcare provider: Wellington Hampshire, MD     Subjective:    Patient ID: Lori [name], female    DOB: Jul 31, 1940, 81 y.o.   MRN: [id number]  Chief Complaint  Patient presents with   Nasal Congestion   Cough    Cough with clear phlegm Symptoms started 04/15/2021 Negative home Covid test    Generalized Body Aches    HPI Patient is in today for cough/congestion Chief complaint: cough/congestion Symptom onset: 1/25 Pertinent positives: cough, congestion, myalgias fevers-99-100.3. hard to breathe from congstion .   Very concerned for husband Pertinent negatives: home tested neg for covid Treatments tried: allergy meds. Vaccine status: all utd. Sick exposure: children had covid over Christmas  Past Medical History:  Diagnosis Date   Allergy    Anemia    past hx of anemia   Aneurysm (HCC)    pseudo-aneurym of carotid  arteries per pt   Anxiety    Aortic atherosclerosis (HCC)    Arthritis    knee- DJD    Barrett's esophagus    Breast cancer (HCC)    right breast IDC   Burning mouth syndrome    Dr Redmond Baseman 11-2016 - no smell or taste x 4 yrs per pt    Cataract    bilateral    Clotting disorder ([place]) 1988   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    Eczema    Family history of breast cancer    Family history of kidney cancer    Family history of multiple myeloma    Family history of thyroid cancer    GERD (gastroesophageal reflux disease)    Headache(784.0)    History of IBS    History of kidney stones    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    Hyperlipidemia    on medication   Hypertension    Low back pain    Meniere disease    Neuromuscular disorder ([place])    raynaud's   Osteopenia    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[place])    Stroke ([place]) 1988   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 carotid artery dissection    Tubular adenoma of colon 02/2013   Varicose veins with inflammation    upper and lower per pt    Vasculitis [facility]     Past Surgical History:  Procedure Laterality Date   arthroscopic knee  2009   left knee/ torn meniscus   BREAST LUMPECTOMY WITH RADIOACTIVE SEED LOCALIZATION Right 11/05/2020  Procedure: RIGHT BREAST LUMPECTOMY WITH RADIOACTIVE SEED LOCALIZATION;  Surgeon: Erroll Luna, MD;  Location: [facility];  Service: General;  Laterality: Right;   BUNIONECTOMY Right 1998   with other foot surgery    carotid artery disection  1988   Carotid Artery Dissection   CATARACT EXTRACTION, BILATERAL  07-18-2017,08-08-2017   CESAREAN SECTION  1988   1 time   COLONOSCOPY  2019   last 2019   [place] OF UTERUS  2004   POLYPECTOMY     POPLITEAL SYNOVIAL CYST EXCISION     left leg   TONSILLECTOMY  1957   UPPER GASTROINTESTINAL ENDOSCOPY     last 2018    Family History  Problem Relation Age of Onset   Multiple myeloma Father 54   Thyroid cancer Sister 67        s/p removal. papilary and anaplastic.    Lung cancer Sister    Kidney disease Brother        cancer- removed   Kidney cancer Brother        dx early 78s   Alzheimer's disease Brother    Stroke Brother 35   Breast cancer Cousin 22       paternal first cousin   Cancer Cousin        unknown type, paternal first cousin   Breast cancer Other        mother's first cousin   Cervical cancer Other    Colon cancer Neg Hx    Esophageal cancer Neg Hx    Rectal cancer Neg Hx    Stomach cancer Neg Hx    Colon polyps Neg Hx     Social History   Socioeconomic History   Marital status: Married    Spouse name: Not on file   Number of children: Not on file   Years of education: Not on file   Highest education level: Not on file  Occupational History   Occupation: retired  Tobacco Use   Smoking status: Never   Smokeless tobacco: Never  Substance and Sexual Activity   Alcohol use: No    Alcohol/week: 0.0 standard drinks   Drug use: No   Sexual activity: Yes    Birth control/protection: Post-menopausal  Other Topics Concern   Not on file  Social History Narrative   Lives with husband who is also a patient of Dr. Yong Channel. Alvester Chou [address], Karleen Hampshire (sees Dr. Yong Channel), Sarah 34. 3 grandkids in 2022   Social Determinants of Health   Financial Resource Strain: Low Risk    Difficulty of Paying Living Expenses: Not hard at all  Food Insecurity: No Food Insecurity   Worried About Charity fundraiser in the Last Year: Never true   Arboriculturist in the Last Year: Never true  Transportation Needs: No Transportation Needs   Lack of Transportation (Medical): No   Lack of Transportation (Non-Medical): No  Physical Activity: Sufficiently Active   Days of Exercise per Week: 6 days   Minutes of Exercise per Session: 50 min  Stress: No Stress Concern Present   Feeling of Stress : Not at all  Social Connections: Socially Integrated   Frequency of Communication with Friends and  Family: More than three times a week   Frequency of Social Gatherings with Friends and Family: More than three times a week   Attends Religious Services: More than 4 times per year   Active Member of [employer] or Organizations: Yes  Attends Archivist Meetings: 1 to 4 times per year   Marital Status: Married  Human resources officer Violence: Not At Risk   Fear of Current or Ex-Partner: No   Emotionally Abused: No   Physically Abused: No   Sexually Abused: No    Outpatient Medications Prior to Visit  Medication Sig Dispense Refill   amLODipine (NORVASC) 2.5 MG tablet TAKE 1 TABLET BY MOUTH EVERY DAY 90 tablet 2   aspirin 81 MG tablet Take 81 mg by mouth daily. Evening     fenofibrate 160 MG tablet TAKE 1 TABLET BY MOUTH EVERY DAY 90 tablet 1   letrozole (FEMARA) 2.5 MG tablet Take 1 tablet (2.5 mg total) by mouth daily. 90 tablet 3   loratadine (CLARITIN) 10 MG tablet Take 10 mg by mouth daily. Take 1/2 tablet daily     meclizine (ANTIVERT) 12.5 MG tablet Take 1 tablet (12.5 mg total) by mouth 3 (three) times daily as needed (off balance). 30 tablet 0   metoprolol tartrate (LOPRESSOR) 50 MG tablet TAKE 1/2 TABLET BY MOUTH 2 TIMES DAILY 90 tablet 1   montelukast (SINGULAIR) 10 MG tablet TAKE 1 TABLET BY MOUTH AT BEDTIME (Patient taking differently: 10 mg.) 90 tablet 1   ondansetron (ZOFRAN-ODT) 4 MG disintegrating tablet Take 1 tablet (4 mg total) by mouth every 8 (eight) hours as needed for nausea or vomiting. 15 tablet 0   pantoprazole (PROTONIX) 40 MG tablet TAKE 1 TABLET BY MOUTH EVERY DAY 90 tablet 1   Vitamin D, Cholecalciferol, 1000 units TABS Take by mouth daily. Morning     famotidine (PEPCID) 10 MG tablet Take 1 tablet (10 mg total) by mouth 2 (two) times daily. (Patient not taking: Reported on 01/12/2021) 60 tablet 0   No facility-administered medications prior to visit.    Allergies  Allergen Reactions   Hydrocodone-Acetaminophen     VOMITING   Azithromycin Other (See  Comments)    Thrush    Klonopin [Clonazepam]     -august 27th- took one dose and experienced zombie like sensation- states will never take agian   Neomycin    Nitrofurantoin     Reports tingling on tongue- no swelling   Penicillins     REACTION: Arm swelling   Pneumococcal Vaccine Polyvalent     ARM REDNESS WITH TENDERNESS   Pneumovax [Pneumococcal Polysaccharide Vaccine]     ROS     Objective:    Physical Exam  Temp 98.3 F (36.8 C) (Oral)    Ht _0  (1.676 m)    BMI 18.50 kg/m  Wt Readings from Last 3 Encounters:  03/12/21 114 lb 9.6 oz (52 kg)  02/27/21 115 lb 9.6 oz (52.4 kg)  02/20/21 113 lb 12.8 oz (51.6 kg)   Congested sounding but no resp distress.    Results for orders placed or performed in visit on 04/17/21  POC COVID-19  Result Value Ref Range   SARS Coronavirus 2 Ag Positive (A) Negative        Assessment & Plan:   Problem List Items Addressed This Visit   None Visit Diagnoses     COVID-19    -  Primary   Nasal congestion       Relevant Orders   POC COVID-19 (Completed)      Covid-will do molnupirivir.  Delsym, tessalon.    I am having Carloyn M. Budhu maintain her aspirin, loratadine, Vitamin D (Cholecalciferol), meclizine, ondansetron, famotidine, montelukast, letrozole, pantoprazole, fenofibrate, metoprolol tartrate, and amLODipine.  No orders of the defined types were placed in this encounter.    I discussed the assessment and treatment plan with the patient. The patient was provided an opportunity to ask questions and all were answered. The patient agreed with the plan and demonstrated an understanding of the instructions.   The patient was advised to call back or seek an in-person evaluation if the symptoms worsen or if the condition fails to improve as anticipated.  I provided 20 minutes of non-face-to-face time during this encounter.   Wellington Hampshire, MD [place] [phone] (phone) [phone]  (fax)  [place]

## 2021-04-17 NOTE — Patient Instructions (Signed)
Meds have been sent the the pharmacy °You can take tylenol for pain/fevers °If worsening symptoms, let us know or go to the Emergency room  ° ° °

## 2021-04-21 ENCOUNTER — Encounter: Payer: Medicare Other | Admitting: Physical Therapy

## 2021-04-23 ENCOUNTER — Ambulatory Visit: Payer: Medicare Other | Admitting: Family Medicine

## 2021-04-24 ENCOUNTER — Encounter: Payer: Self-pay | Admitting: Family Medicine

## 2021-04-28 ENCOUNTER — Encounter: Payer: Medicare Other | Admitting: Physical Therapy

## 2021-05-12 ENCOUNTER — Telehealth: Payer: Self-pay | Admitting: Family Medicine

## 2021-05-12 NOTE — Chronic Care Management (AMB) (Signed)
°  Chronic Care Management   Outreach Note  05/12/2021 Name: Lori [name] MRN: [id number] DOB: May 17, 1940  Referred by: [name], MD Reason for referral : No chief complaint on file.   An unsuccessful telephone outreach was attempted today. The patient was referred to the pharmacist for assistance with care management and care coordination.   Follow Up Plan:   Tatjana Dellinger Upstream Scheduler

## 2021-05-13 ENCOUNTER — Telehealth: Payer: Self-pay | Admitting: Family Medicine

## 2021-05-13 NOTE — Progress Notes (Signed)
°  Chronic Care Management   Note  05/13/2021 Name: Lori [name] MRN: [id number] DOB: March 19, 1941  Lori [name] is a 81 y.o. year old female who is a primary care patient of Yong Channel, Brayton Mars, MD. I reached out to Minna Antis by phone today in response to a referral sent by Ms. Wilmon Pali PCP, [name], MD.   Lori [name] was given information about Chronic Care Management services today including:  CCM service includes personalized support from designated clinical staff supervised by her physician, including individualized plan of care and coordination with other care providers 24/7 contact phone numbers for assistance for urgent and routine care needs. Service will only be billed when office clinical staff spend 20 minutes or more in a month to coordinate care. Only one practitioner may furnish and bill the service in a calendar month. The patient may stop CCM services at any time (effective at the end of the month) by phone call to the office staff.   Patient agreed to services and verbal consent obtained.   Follow up [place] 06/12/2021@11AM  TELEVISIT   [place]

## 2021-05-18 ENCOUNTER — Other Ambulatory Visit: Payer: Self-pay

## 2021-05-18 ENCOUNTER — Ambulatory Visit: Payer: Medicare Other | Admitting: Physical Therapy

## 2021-05-18 DIAGNOSIS — M25562 Pain in left knee: Secondary | ICD-10-CM | POA: Diagnosis not present

## 2021-05-18 DIAGNOSIS — M25561 Pain in right knee: Secondary | ICD-10-CM

## 2021-05-18 DIAGNOSIS — M25512 Pain in left shoulder: Secondary | ICD-10-CM | POA: Diagnosis not present

## 2021-05-18 DIAGNOSIS — G8929 Other chronic pain: Secondary | ICD-10-CM

## 2021-05-18 NOTE — Progress Notes (Signed)
Phone [phone] In person visit   Subjective:   Lori [name] is a 81 y.o. year old very pleasant female patient who presents for/with See problem oriented charting Chief Complaint  Patient presents with   Dizziness    Pt c/o vertigo that started up again this past Sunday that lasted about 20 min she felt like she was drunk and the room was spinning.   This visit occurred during the SARS-CoV-2 public health emergency.  Safety protocols were in place, including screening questions prior to the visit, additional usage of staff PPE, and extensive cleaning of exam room while observing appropriate contact time as indicated for disinfecting solutions.   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    Priority: Medium    Hypertension 11/29/2013    Priority: Medium    Hyperlipidemia 11/29/2013    Priority: Medium    Barrett's esophagus 08/24/2013    Priority: Medium    Allergic rhinitis 03/09/2007    Priority: Medium    Osteoporosis 09/22/2006    Priority: Medium    IBS (irritable bowel syndrome) 06/26/2019    Priority: Low   Aortic atherosclerosis ([place]) 02/11/2016    Priority: Low   Eczema 11/29/2013    Priority: Low   GERD (gastroesophageal reflux disease) 05/16/2013    Priority: Low   Vitamin D deficiency 01/29/2012    Priority: Low   Solitary pulmonary nodule 06/08/2011    Priority: Low   HIATAL HERNIA WITH REFLUX 02/06/2010    Priority: Low   DEGENERATIVE JOINT DISEASE, KNEE 03/14/2008    Priority: Low   VARICOSE VEINS LOWER EXTREMITIES W/INFLAMMATION 03/09/2007    Priority: Low   ACTINIC KERATOSIS, FOREHEAD, LEFT 03/09/2007    Priority: Low   Headache(784.0) 03/09/2007    Priority: Low   MENIERE'S DISEASE 09/22/2006    Priority: Low   RAYNAUD'S DISEASE 09/22/2006    Priority: Low    Abnormal ear sensation, right 02/27/2021   Genetic testing 11/10/2020   Family history of kidney cancer 10/22/2020   Family history of thyroid cancer 10/22/2020   Family history of multiple myeloma 10/22/2020   Family history of breast cancer 10/22/2020   Malignant neoplasm of lower-inner quadrant of right breast of female, estrogen receptor positive ([place]) 10/17/2020    Medications- reviewed and updated Current Outpatient Medications  Medication Sig Dispense Refill   amLODipine (NORVASC) 2.5 MG tablet TAKE 1 TABLET BY MOUTH EVERY DAY 90 tablet 2   aspirin 81 MG tablet Take 81 mg by mouth daily. Evening     cyanocobalamin 1000 MCG tablet Take 1,000 mcg by mouth daily.     letrozole (FEMARA) 2.5 MG tablet Take 1 tablet (2.5 mg total) by mouth daily. 90 tablet 3   loratadine (CLARITIN) 10 MG tablet Take 10 mg by mouth daily. Take 1/2 tablet daily     metoprolol tartrate (LOPRESSOR) 50 MG tablet TAKE 1/2 TABLET BY MOUTH 2 TIMES DAILY 90 tablet 1   montelukast (SINGULAIR) 10 MG tablet TAKE 1 TABLET BY MOUTH AT BEDTIME (Patient taking differently: 10 mg.) 90 tablet 1   ondansetron (ZOFRAN-ODT) 4 MG disintegrating tablet Take 1 tablet (4 mg total) by mouth every 8 (eight) hours as needed for nausea or vomiting. 15 tablet 0   pantoprazole (PROTONIX) 40 MG tablet TAKE  1 TABLET BY MOUTH EVERY DAY 90 tablet 1   rosuvastatin (CRESTOR) 10 MG tablet Take 1 tablet (10 mg total) by mouth once a week. 13 tablet 3   Vitamin D, Cholecalciferol, 1000 units TABS Take by mouth daily. Morning     No current facility-administered medications for this visit.     Objective:  BP 136/63 Comment: most recent home reading   Pulse 91    Temp 97.9 F (36.6 C)    Ht 5' 6" (1.676 m)    Wt 113 lb 8.6 oz (51.5 kg)    SpO2 99%    BMI 18.33 kg/m  Gen: NAD, resting comfortably CV: RRR no murmurs rubs or gallops Lungs: CTAB no crackles, wheeze, rhonchi Ext: no edema Skin: warm, dry Neuro: appears stable on feet- she  reports some stiffness    Assessment and Plan   #osteopenia- notes from [facility]" Bone density is pretty stable but very close to osteoporosis range-in fact change noted is 1.6% or less which is not statistically significant-still technically in osteopenia/low bone density range. We could discuss further at next visit   but technically with your hip fracture risk at 5.5% which was above 3% guidelines recommend starting medication" - needs to avoid fosamax with GERD/barretts history -plans to restart walking -wants to hold off on meds -gets calcium through diet -recheck 2 years -also on PPI  # Vertigo  S: Patient had an episode last Sunday that lasted about 15-20 minutes that she felt like she was drunk. States room was not spinning. . She had felt more tired in general tat day- had a tired eye sensation- wondered if allergies before this started then bent over and set things off- drunk sensation. Since that time has not had anymore episodes. Took a few days off driving but restarted.    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 Saintclair Halsted. She had follow up mr angio which was stable with Dr. Kathyrn Sheriff on 01/29/21.  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  Dr. 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  At time of last visit in 2021 for this Flonase actually seem to be most helpful and had follow-up with Dr. Redmond Baseman also to assess her parotid gland (fine-needle aspiration largely reassuring in regards to cancer)-observation was planned last seen 01/21/2021 with plan for 1 year follow-up A/P: 1 episode of vertigo recently  without recurrence- could have been meniere's flare. Possible BPPV but duration of episodes was slightly long. 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 Patient with right lumpectomy 11/05/2020 with negative margins thankfully. As a result she does not require adjuvant radiation. She will be on letrozole daily for 5 years. She was following with Dr. Lindi Adie- sees him in may - breast cancer stable with treatment- continue current meds and oncology follow up  -has noted some weight loss on letrozole. Weight down 8 lbs in last year on this. Had been to a nutrition with weight loss in the past when dealing with Barrett's - has some old sheets she can use to try  to gain weight Wt Readings from Last 3 Encounters:  05/20/21 113 lb 8.6 oz (51.5 kg)  03/12/21 114 lb 9.6 oz (52 kg)  02/27/21 115 lb 9.6 oz (52.4 kg)   # Hypertension S:compliant with amlodipine 2.63m, metoprolol 25 mg BID.  Home readings #s: 136/63 yesterday before meds- had not eaten yet or taken BP meds BP Readings from Last 3 Encounters:  05/20/21 136/63  03/12/21 (!) 156/84  02/27/21 (!) 150/68  A/P: reasonable control at home- continue current meds. Tends to get somewhat nervous at visits and BP runs up  #some weight loss- is going to bulk diet- we will also check thyroid  Lab Results  Component Value Date   TSH 1.97 07/12/2017    # GERD/barrett's esophagus not noted 10/2019 Dr. SFuller Plan q3 year endoscopy S:Compliant with protonix 458mdaily- using [employer] coffee and manuka honey starting late 2019 has been very helpful B12 levels related to PPI use: takes b12 once a week now Lab Results  Component Value Date   VITAMINB12 384 06/26/2019   A/P: GERD stable -continue current meds- not ideal for osteopenia though. B12 I think will be better on once a week- update today   # Hyperlipidemia/aortic atherosclerosis S:lipids  had been reasonably controlled on fenofibrate. LDL goal at least under 100 and triglyceride goal under 200.  Lab Results  Component Value Date   CHOL 156 01/02/2021   HDL 74.80 01/02/2021   LDLCALC 74 01/02/2021   LDLDIRECT 74.0 02/06/2016   TRIG 37.0 01/02/2021   CHOLHDL 2 01/02/2021   A/P: cholesterol reasonably controlled but she has read that fenofibrate does not have same mortality benefit (I agree) so we opted to change to rosuvastatin 10 mg weekly and recheck next visit- cautious to make sure she tolerates   # Vitamin D deficiency- on 1000 units daily  Last vitamin D Lab Results  Component Value Date   VD25OH 40.07 01/02/2021   A/P: hopefully stable- recheck next visit- at least yearly   #Allergic rhinitis S: Singulair worked extremely well for her . Taking half a claritin A/P: stable- wonder if recent flare could have come from beginning of allergy season with vertigo- no recurrent issues sine starting claritin  # Hyperglycemia/insulin resistance/prediabetes S:  Medication: none Lab Results  Component Value Date   HGBA1C 6.1 01/02/2021   HGBA1C 6.2 06/26/2020   HGBA1C 6.0 (H) 12/27/2019    A/P: hopefully stable or improved- update a1c today. Continue current meds for now. I am ok with some weight gain and even a1c going up slightly- she needs more "cushion" for her weight  #constipation- bulking fiber in diet- wants to double prunes to 4 a day and maintain veggies  #mild anemia- monitor with labs- up to date on colonoscopy 04/2018 and will see Dr. StFuller Planext year Lab Results  Component Value Date   WBC 7.4 01/02/2021   HGB 11.7 (L) 01/02/2021   HCT 36.1 01/02/2021   MCV 86.2 01/02/2021   PLT 290.0 01/02/2021    #  HM- reacted poorly to pneumovax 23 in past- wants to hold off on prevnar 20, plans shingrix at pharmacy   Recommended follow up: Return for next already scheduled visit or sooner if needed. Future Appointments  Date Time Provider [place] 05/27/2021 10:15 AM CaLyndee HensenPT [facility]  06/02/2021 11:00 AM CaLyndee HensenPT [facility]  06/12/2021 11:00 AM [facility] CCM PHARMACIST [facility]  07/13/2021 10:40 AM HuMarin OlpMD [facility]  08/19/2021  11:15 AM Nicholas Lose, MD [facility] None  10/08/2021  2:30 PM [facility] HEALTH COACH [facility]  01/06/2022  9:20 AM , Brayton Mars, MD [facility]    Lab/Order associations:   ICD-10-CM   1. Primary hypertension  I10 TSH    CBC with Differential/Platelet    Comprehensive metabolic panel    2. Vertigo  R42     3. Hyperlipidemia, unspecified hyperlipidemia type  E78.5 TSH    CBC with Differential/Platelet    Comprehensive metabolic panel    4. Gastroesophageal reflux disease without esophagitis  K21.9     5. Barrett's esophagus without dysplasia  K22.70     6. Aortic atherosclerosis (HCC)  I70.0     7. Hyperglycemia  R73.9 Hemoglobin A1c    8. High risk medication use  Z79.899 Vitamin B12     Meds ordered this encounter  Medications   rosuvastatin (CRESTOR) 10 MG tablet    Sig: Take 1 tablet (10 mg total) by mouth once a week.    Dispense:  13 tablet    Refill:  3   I,Jada Bradford,acting as a scribe for Garret Reddish, MD.,have documented all relevant documentation on the behalf of Garret Reddish, MD,as directed by  Garret Reddish, MD while in the presence of Garret Reddish, MD.  I, Garret Reddish, MD, have reviewed all documentation for this visit. The documentation on 05/20/21 for the exam, diagnosis, procedures, and orders are all accurate and complete.  Return precautions advised.  Garret Reddish, MD

## 2021-05-18 NOTE — Therapy (Signed)
°OUTPATIENT PHYSICAL THERAPY TREATMENT /RE-EVAL ° ° °Patient Name: Lori [name] °MRN: [id number] °DOB:08/15/1940, 80 y.o., female °Today's Date: 04/08/21 ° ° PT End of Session - 05/20/21 1316   ° ° Visit Number 4   ° Number of Visits 12   ° Date for PT Re-Evaluation 07/01/21   ° Authorization Type UHC Medicare/ Re-Eval done at visit 4   ° PT Start Time 1015   ° PT Stop Time 1053   ° PT Time Calculation (min) 38 min   ° Activity Tolerance Patient tolerated treatment well   ° Behavior During Therapy WFL for tasks assessed/performed   ° °  °  ° °  ° ° ° °Past Medical History:  °Diagnosis Date  ° Allergy   ° Anemia   ° past hx of anemia  ° Aneurysm (HCC)   ° pseudo-aneurym of carotid arteries per pt  ° Anxiety   ° Aortic atherosclerosis (HCC)   ° Arthritis   ° knee- DJD   ° Barrett's esophagus   ° Breast cancer (HCC)   ° right breast IDC  ° Burning mouth syndrome   ° Dr Bates 11-2016 - no smell or taste x 4 yrs per pt   ° Cataract   ° bilateral   ° Clotting disorder (HCC) 1988  ° 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   ° Eczema   ° Family history of breast cancer   ° Family history of kidney cancer   ° Family history of multiple myeloma   ° Family history of thyroid cancer   ° GERD (gastroesophageal reflux disease)   ° Headache(784.0)   ° History of IBS   ° History of kidney stones   ° Horner's syndrome   ° 1988 pregnancy   ° Hyperlipidemia   ° on medication  ° Hypertension   ° Low back pain   ° Meniere disease   ° Neuromuscular disorder (HCC)   ° raynaud's  ° Osteopenia   ° 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HCC)   ° Stroke (HCC) 1988  ° 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 carotid artery dissection   ° Tubular adenoma of colon 02/2013  ° Varicose veins with inflammation   ° upper and lower per pt   ° Vasculitis (HCC)   ° °Past Surgical History:  °Procedure Laterality Date  ° arthroscopic knee  2009  ° left knee/ torn meniscus  ° BREAST LUMPECTOMY WITH RADIOACTIVE SEED LOCALIZATION Right 11/05/2020  ° Procedure: RIGHT BREAST  LUMPECTOMY WITH RADIOACTIVE SEED LOCALIZATION;  Surgeon: Cornett, Thomas, MD;  Location: [facility];  Service: General;  Laterality: Right;  ° BUNIONECTOMY Right 1998  ° with other foot surgery   ° carotid artery disection  1988  ° Carotid Artery Dissection  ° CATARACT EXTRACTION, BILATERAL  07-18-2017,08-08-2017  ° CESAREAN SECTION  1988  ° 1 time  ° COLONOSCOPY  2019  ° last 2019  ° DILATION AND CURETTAGE OF UTERUS  2004  ° POLYPECTOMY    ° POPLITEAL SYNOVIAL CYST EXCISION    ° left leg  ° TONSILLECTOMY  1957  ° UPPER GASTROINTESTINAL ENDOSCOPY    ° last 2018  ° °Patient Active Problem List  ° Diagnosis Date Noted  ° Abnormal ear sensation, right 02/27/2021  ° Genetic testing 11/10/2020  ° Family history of kidney cancer 10/22/2020  ° Family history of thyroid cancer 10/22/2020  ° Family history of multiple myeloma 10/22/2020  ° Family history of breast cancer 10/22/2020  ° Malignant neoplasm of lower-inner   °OUTPATIENT PHYSICAL THERAPY TREATMENT /RE-EVAL ° ° °Patient Name: Lori [name] °MRN: [id number] °DOB:08/15/1940, 80 y.o., female °Today's Date: 04/08/21 ° ° PT End of Session - 05/20/21 1316   ° ° Visit Number 4   ° Number of Visits 12   ° Date for PT Re-Evaluation 07/01/21   ° Authorization Type UHC Medicare/ Re-Eval done at visit 4   ° PT Start Time 1015   ° PT Stop Time 1053   ° PT Time Calculation (min) 38 min   ° Activity Tolerance Patient tolerated treatment well   ° Behavior During Therapy WFL for tasks assessed/performed   ° °  °  ° °  ° ° ° °Past Medical History:  °Diagnosis Date  ° Allergy   ° Anemia   ° past hx of anemia  ° Aneurysm (HCC)   ° pseudo-aneurym of carotid arteries per pt  ° Anxiety   ° Aortic atherosclerosis (HCC)   ° Arthritis   ° knee- DJD   ° Barrett's esophagus   ° Breast cancer (HCC)   ° right breast IDC  ° Burning mouth syndrome   ° Dr Bates 11-2016 - no smell or taste x 4 yrs per pt   ° Cataract   ° bilateral   ° Clotting disorder (HCC) 1988  ° 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   ° Eczema   ° Family history of breast cancer   ° Family history of kidney cancer   ° Family history of multiple myeloma   ° Family history of thyroid cancer   ° GERD (gastroesophageal reflux disease)   ° Headache(784.0)   ° History of IBS   ° History of kidney stones   ° Horner's syndrome   ° 1988 pregnancy   ° Hyperlipidemia   ° on medication  ° Hypertension   ° Low back pain   ° Meniere disease   ° Neuromuscular disorder (HCC)   ° raynaud's  ° Osteopenia   ° 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HCC)   ° Stroke (HCC) 1988  ° 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 carotid artery dissection   ° Tubular adenoma of colon 02/2013  ° Varicose veins with inflammation   ° upper and lower per pt   ° Vasculitis (HCC)   ° °Past Surgical History:  °Procedure Laterality Date  ° arthroscopic knee  2009  ° left knee/ torn meniscus  ° BREAST LUMPECTOMY WITH RADIOACTIVE SEED LOCALIZATION Right 11/05/2020  ° Procedure: RIGHT BREAST  LUMPECTOMY WITH RADIOACTIVE SEED LOCALIZATION;  Surgeon: Cornett, Thomas, MD;  Location: [facility];  Service: General;  Laterality: Right;  ° BUNIONECTOMY Right 1998  ° with other foot surgery   ° carotid artery disection  1988  ° Carotid Artery Dissection  ° CATARACT EXTRACTION, BILATERAL  07-18-2017,08-08-2017  ° CESAREAN SECTION  1988  ° 1 time  ° COLONOSCOPY  2019  ° last 2019  ° DILATION AND CURETTAGE OF UTERUS  2004  ° POLYPECTOMY    ° POPLITEAL SYNOVIAL CYST EXCISION    ° left leg  ° TONSILLECTOMY  1957  ° UPPER GASTROINTESTINAL ENDOSCOPY    ° last 2018  ° °Patient Active Problem List  ° Diagnosis Date Noted  ° Abnormal ear sensation, right 02/27/2021  ° Genetic testing 11/10/2020  ° Family history of kidney cancer 10/22/2020  ° Family history of thyroid cancer 10/22/2020  ° Family history of multiple myeloma 10/22/2020  ° Family history of breast cancer 10/22/2020  ° Malignant neoplasm of lower-inner   °OUTPATIENT PHYSICAL THERAPY TREATMENT /RE-EVAL ° ° °Patient Name: Lori [name] °MRN: [id number] °DOB:08/15/1940, 80 y.o., female °Today's Date: 04/08/21 ° ° PT End of Session - 05/20/21 1316   ° ° Visit Number 4   ° Number of Visits 12   ° Date for PT Re-Evaluation 07/01/21   ° Authorization Type UHC Medicare/ Re-Eval done at visit 4   ° PT Start Time 1015   ° PT Stop Time 1053   ° PT Time Calculation (min) 38 min   ° Activity Tolerance Patient tolerated treatment well   ° Behavior During Therapy WFL for tasks assessed/performed   ° °  °  ° °  ° ° ° °Past Medical History:  °Diagnosis Date  ° Allergy   ° Anemia   ° past hx of anemia  ° Aneurysm (HCC)   ° pseudo-aneurym of carotid arteries per pt  ° Anxiety   ° Aortic atherosclerosis (HCC)   ° Arthritis   ° knee- DJD   ° Barrett's esophagus   ° Breast cancer (HCC)   ° right breast IDC  ° Burning mouth syndrome   ° Dr Bates 11-2016 - no smell or taste x 4 yrs per pt   ° Cataract   ° bilateral   ° Clotting disorder (HCC) 1988  ° 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   ° Eczema   ° Family history of breast cancer   ° Family history of kidney cancer   ° Family history of multiple myeloma   ° Family history of thyroid cancer   ° GERD (gastroesophageal reflux disease)   ° Headache(784.0)   ° History of IBS   ° History of kidney stones   ° Horner's syndrome   ° 1988 pregnancy   ° Hyperlipidemia   ° on medication  ° Hypertension   ° Low back pain   ° Meniere disease   ° Neuromuscular disorder (HCC)   ° raynaud's  ° Osteopenia   ° 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HCC)   ° Stroke (HCC) 1988  ° 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 carotid artery dissection   ° Tubular adenoma of colon 02/2013  ° Varicose veins with inflammation   ° upper and lower per pt   ° Vasculitis (HCC)   ° °Past Surgical History:  °Procedure Laterality Date  ° arthroscopic knee  2009  ° left knee/ torn meniscus  ° BREAST LUMPECTOMY WITH RADIOACTIVE SEED LOCALIZATION Right 11/05/2020  ° Procedure: RIGHT BREAST  LUMPECTOMY WITH RADIOACTIVE SEED LOCALIZATION;  Surgeon: Cornett, Thomas, MD;  Location: [facility];  Service: General;  Laterality: Right;  ° BUNIONECTOMY Right 1998  ° with other foot surgery   ° carotid artery disection  1988  ° Carotid Artery Dissection  ° CATARACT EXTRACTION, BILATERAL  07-18-2017,08-08-2017  ° CESAREAN SECTION  1988  ° 1 time  ° COLONOSCOPY  2019  ° last 2019  ° DILATION AND CURETTAGE OF UTERUS  2004  ° POLYPECTOMY    ° POPLITEAL SYNOVIAL CYST EXCISION    ° left leg  ° TONSILLECTOMY  1957  ° UPPER GASTROINTESTINAL ENDOSCOPY    ° last 2018  ° °Patient Active Problem List  ° Diagnosis Date Noted  ° Abnormal ear sensation, right 02/27/2021  ° Genetic testing 11/10/2020  ° Family history of kidney cancer 10/22/2020  ° Family history of thyroid cancer 10/22/2020  ° Family history of multiple myeloma 10/22/2020  ° Family history of breast cancer 10/22/2020  ° Malignant neoplasm of lower-inner   °OUTPATIENT PHYSICAL THERAPY TREATMENT /RE-EVAL ° ° °Patient Name: Lori [name] °MRN: [id number] °DOB:08/15/1940, 80 y.o., female °Today's Date: 04/08/21 ° ° PT End of Session - 05/20/21 1316   ° ° Visit Number 4   ° Number of Visits 12   ° Date for PT Re-Evaluation 07/01/21   ° Authorization Type [employer]HC Medicare/ Re-Eval done at visit 4   ° PT Start Time 1015   ° PT Stop Time 1053   ° PT Time Calculation (min) 38 min   ° Activity Tolerance Patient tolerated treatment well   ° Behavior During Therapy WFL for tasks assessed/performed   ° °  °  ° °  ° ° ° °Past Medical History:  °Diagnosis Date  ° Allergy   ° Anemia   ° past hx of anemia  ° Aneurysm (HCC)   ° pseudo-aneurym of carotid arteries per pt  ° Anxiety   ° Aortic atherosclerosis (HCC)   ° Arthritis   ° knee- DJD   ° Barrett's esophagus   ° Breast cancer (HCC)   ° right breast IDC  ° Burning mouth syndrome   ° Dr Bates 11-2016 - no smell or taste x 4 yrs per pt   ° Cataract   ° bilateral   ° Clotting disorder (HCC) 1988  ° 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   ° Eczema   ° Family history of breast cancer   ° Family history of kidney cancer   ° Family history of multiple myeloma   ° Family history of thyroid cancer   ° GERD (gastroesophageal reflux disease)   ° Headache(784.0)   ° History of IBS   ° History of kidney stones   ° Horner's syndrome   ° 1988 pregnancy   ° Hyperlipidemia   ° on medication  ° Hypertension   ° Low back pain   ° Meniere disease   ° Neuromuscular disorder (HCC)   ° raynaud's  ° Osteopenia   ° 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HCC)   ° Stroke (HCC) 1988  ° 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 carotid artery dissection   ° Tubular adenoma of colon 02/2013  ° Varicose veins with inflammation   ° upper and lower per pt   ° Vasculitis (HCC)   ° °Past Surgical History:  °Procedure Laterality Date  ° arthroscopic knee  2009  ° left knee/ torn meniscus  ° BREAST LUMPECTOMY WITH RADIOACTIVE SEED LOCALIZATION Right 11/05/2020  ° Procedure: RIGHT BREAST  LUMPECTOMY WITH RADIOACTIVE SEED LOCALIZATION;  Surgeon: Cornett, Thomas, MD;  Location: [facility];  Service: General;  Laterality: Right;  ° BUNIONECTOMY Right 1998  ° with other foot surgery   ° carotid artery disection  1988  ° Carotid Artery Dissection  ° CATARACT EXTRACTION, BILATERAL  07-18-2017,08-08-2017  ° CESAREAN SECTION  1988  ° 1 time  ° COLONOSCOPY  2019  ° last 2019  ° DILATION AND CURETTAGE OF UTERUS  2004  ° POLYPECTOMY    ° POPLITEAL SYNOVIAL CYST EXCISION    ° left leg  ° TONSILLECTOMY  1957  ° UPPER GASTROINTESTINAL ENDOSCOPY    ° last 2018  ° °Patient Active Problem List  ° Diagnosis Date Noted  ° Abnormal ear sensation, right 02/27/2021  ° Genetic testing 11/10/2020  ° Family history of kidney cancer 10/22/2020  ° Family history of thyroid cancer 10/22/2020  ° Family history of multiple myeloma 10/22/2020  ° Family history of breast cancer 10/22/2020  ° Malignant neoplasm of lower-inner

## 2021-05-20 ENCOUNTER — Ambulatory Visit (INDEPENDENT_AMBULATORY_CARE_PROVIDER_SITE_OTHER): Payer: Medicare Other | Admitting: Family Medicine

## 2021-05-20 ENCOUNTER — Other Ambulatory Visit: Payer: Self-pay

## 2021-05-20 ENCOUNTER — Encounter: Payer: Self-pay | Admitting: Family Medicine

## 2021-05-20 ENCOUNTER — Encounter: Payer: Self-pay | Admitting: Physical Therapy

## 2021-05-20 VITALS — BP 136/63 | HR 91 | Temp 97.9°F | Ht 66.0 in | Wt 113.5 lb

## 2021-05-20 DIAGNOSIS — C50311 Malignant neoplasm of lower-inner quadrant of right female breast: Secondary | ICD-10-CM

## 2021-05-20 DIAGNOSIS — K2270 Barrett's esophagus without dysplasia: Secondary | ICD-10-CM | POA: Diagnosis not present

## 2021-05-20 DIAGNOSIS — I10 Essential (primary) hypertension: Secondary | ICD-10-CM

## 2021-05-20 DIAGNOSIS — R739 Hyperglycemia, unspecified: Secondary | ICD-10-CM

## 2021-05-20 DIAGNOSIS — Z79899 Other long term (current) drug therapy: Secondary | ICD-10-CM | POA: Diagnosis not present

## 2021-05-20 DIAGNOSIS — R42 Dizziness and giddiness: Secondary | ICD-10-CM

## 2021-05-20 DIAGNOSIS — I700 Atherosclerosis of aorta: Secondary | ICD-10-CM

## 2021-05-20 DIAGNOSIS — E785 Hyperlipidemia, unspecified: Secondary | ICD-10-CM | POA: Diagnosis not present

## 2021-05-20 DIAGNOSIS — Z170 Estrogen receptor positive status [ER+]: Secondary | ICD-10-CM

## 2021-05-20 DIAGNOSIS — K219 Gastro-esophageal reflux disease without esophagitis: Secondary | ICD-10-CM

## 2021-05-20 LAB — COMPREHENSIVE METABOLIC PANEL
ALT: 17 U/L (ref 0–35)
AST: 24 U/L (ref 0–37)
Albumin: 4.4 g/dL (ref 3.5–5.2)
Alkaline Phosphatase: 46 U/L (ref 39–117)
BUN: 24 mg/dL — ABNORMAL HIGH (ref 6–23)
CO2: 27 mEq/L (ref 19–32)
Calcium: 10 mg/dL (ref 8.4–10.5)
Chloride: 103 mEq/L (ref 96–112)
Creatinine, Ser: 0.69 mg/dL (ref 0.40–1.20)
GFR: 82.67 mL/min (ref >60.00)
Glucose, Bld: 63 mg/dL — ABNORMAL LOW (ref 70–99)
Potassium: 4.6 mEq/L (ref 3.5–5.1)
Sodium: 139 mEq/L (ref 135–145)
Total Bilirubin: 0.4 mg/dL (ref 0.2–1.2)
Total Protein: 8 g/dL (ref 6.0–8.3)

## 2021-05-20 LAB — CBC WITH DIFFERENTIAL/PLATELET
Basophils Absolute: 0 10*3/uL (ref 0.0–0.1)
Basophils Relative: 0.7 % (ref 0.0–3.0)
Eosinophils Absolute: 0.2 10*3/uL (ref 0.0–0.7)
Eosinophils Relative: 4.4 % (ref 0.0–5.0)
HCT: 36.6 % (ref 36.0–46.0)
Hemoglobin: 11.9 g/dL — ABNORMAL LOW (ref 12.0–15.0)
Lymphocytes Relative: 40.8 % (ref 12.0–46.0)
Lymphs Abs: 2.3 10*3/uL (ref 0.7–4.0)
MCHC: 32.7 g/dL (ref 30.0–36.0)
MCV: 85.5 fl (ref 78.0–100.0)
Monocytes Absolute: 0.7 10*3/uL (ref 0.1–1.0)
Monocytes Relative: 13.2 % — ABNORMAL HIGH (ref 3.0–12.0)
Neutro Abs: 2.3 10*3/uL (ref 1.4–7.7)
Neutrophils Relative %: 40.9 % — ABNORMAL LOW (ref 43.0–77.0)
Platelets: 295 10*3/uL (ref 150.0–400.0)
RBC: 4.28 Mil/uL (ref 3.87–5.11)
RDW: 14.2 % (ref 11.5–15.5)
WBC: 5.7 10*3/uL (ref 4.0–10.5)

## 2021-05-20 LAB — HEMOGLOBIN A1C: Hgb A1c MFr Bld: 6.2 % (ref 4.6–6.5)

## 2021-05-20 LAB — VITAMIN B12: Vitamin B-12: 705 pg/mL (ref 211–911)

## 2021-05-20 LAB — TSH: TSH: 1.21 u[IU]/mL (ref 0.35–5.50)

## 2021-05-20 MED ORDER — ROSUVASTATIN CALCIUM 10 MG PO TABS: 10.0000 mg | ORAL_TABLET | ORAL | 3 refills | Status: DC

## 2021-05-20 NOTE — Patient Instructions (Addendum)
Let us know when you get your shingles/shingrix shot at pharmacy ?-we opted to hold off on pneumonia shot ? ?Stop fenofibrate- star rosuvastatin 10 mg once  a week- we can recheck how things are doing next visit ? ?Please stop by lab before you go ?If you have mychart- we will send your results within 3 business days of [place] receiving them.  ?If you do not have mychart- we will call you about results within 5 business days of [place] receiving them.  ?*please also note that you will see labs on mychart as soon as they post. I will later go in and write notes on them- will say "notes from Dr. Yong Channel"  ? ?Recommended follow up: Return for next already scheduled visit or sooner if needed.  For October ?-you can cancel the April 24th visit at the desk ? ?

## 2021-05-23 ENCOUNTER — Other Ambulatory Visit: Payer: Self-pay | Admitting: Family Medicine

## 2021-05-25 ENCOUNTER — Ambulatory Visit: Payer: Medicare Other | Admitting: Family Medicine

## 2021-05-27 ENCOUNTER — Other Ambulatory Visit: Payer: Self-pay

## 2021-05-27 ENCOUNTER — Ambulatory Visit: Payer: Medicare Other | Admitting: Physical Therapy

## 2021-05-27 ENCOUNTER — Encounter: Payer: Self-pay | Admitting: Physical Therapy

## 2021-05-27 DIAGNOSIS — G8929 Other chronic pain: Secondary | ICD-10-CM | POA: Diagnosis not present

## 2021-05-27 DIAGNOSIS — M25561 Pain in right knee: Secondary | ICD-10-CM

## 2021-05-27 DIAGNOSIS — M25512 Pain in left shoulder: Secondary | ICD-10-CM

## 2021-05-27 DIAGNOSIS — M25562 Pain in left knee: Secondary | ICD-10-CM | POA: Diagnosis not present

## 2021-05-27 NOTE — Therapy (Addendum)
OUTPATIENT PHYSICAL THERAPY TREATMENT    Patient Name: Lori [name] MRN: [id number] DOB:[birth year], 81 y.o., female Today's Date: 05/27/2021    PT End of Session - 05/27/21 1059     Visit Number 5    Number of Visits 12    Date for PT Re-Evaluation 06/29/21    Authorization Type UHC Medicare/ Re-Eval done at visit 4    PT Start Time 1020    PT Stop Time 1100    PT Time Calculation (min) 40 min    Activity Tolerance Patient tolerated treatment well    Behavior During Therapy WFL for tasks assessed/performed               Past Medical History:  Diagnosis Date   Allergy    Anemia    past hx of anemia   Aneurysm ([place])    pseudo-aneurym of carotid arteries per pt   Anxiety    Aortic atherosclerosis (HCC)    Arthritis    knee- DJD    Barrett's esophagus    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   Burning mouth syndrome    Dr Redmond Baseman 11-2016 - no smell or taste x 4 yrs per pt    Cataract    bilateral    Clotting disorder ([place]) 1988   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    Eczema    Family history of breast cancer    Family history of kidney cancer    Family history of multiple myeloma    Family history of thyroid cancer    GERD (gastroesophageal reflux disease)    Headache(784.0)    History of IBS    History of kidney stones    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    Hyperlipidemia    on medication   Hypertension    Low back pain    Meniere disease    Neuromuscular disorder ([place])    raynaud's   Osteopenia    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[place])    Stroke ([place]) 1988   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 carotid artery dissection    Tubular adenoma of colon 02/2013   Varicose veins with inflammation    upper and lower per pt    Vasculitis ([place])    Past Surgical History:  Procedure Laterality Date   arthroscopic knee  2009   left knee/ torn meniscus   BREAST LUMPECTOMY WITH RADIOACTIVE SEED LOCALIZATION Right 11/05/2020   Procedure: RIGHT BREAST  LUMPECTOMY WITH RADIOACTIVE SEED LOCALIZATION;  Surgeon: Erroll Luna, MD;  Location: [facility];  Service: General;  Laterality: Right;   BUNIONECTOMY Right 1998   with other foot surgery    carotid artery disection  1988   Carotid Artery Dissection   CATARACT EXTRACTION, BILATERAL  07-18-2017,08-08-2017   CESAREAN SECTION  1988   1 time   COLONOSCOPY  2019   last 2019   [place] OF UTERUS  2004   POLYPECTOMY     POPLITEAL SYNOVIAL CYST EXCISION     left leg   TONSILLECTOMY  1957   UPPER GASTROINTESTINAL ENDOSCOPY     last 2018   Patient Active Problem List   Diagnosis Date Noted   Abnormal ear sensation, right 02/27/2021   Genetic testing 11/10/2020   Family history of kidney cancer 10/22/2020   Family history of thyroid cancer 10/22/2020   Family history of multiple myeloma 10/22/2020   Family history of breast cancer 10/22/2020   Malignant  °OUTPATIENT PHYSICAL THERAPY TREATMENT  ° ° °Patient Name: Lori [name] °MRN: [id number] °DOB:01/15/1941, 80 y.o., female °Today's Date: 05/27/2021 ° ° ° PT End of Session - 05/27/21 1059   ° ° Visit Number 5   ° Number of Visits 12   ° Date for PT Re-Evaluation 06/29/21   ° Authorization Type UHC Medicare/ Re-Eval done at visit 4   ° PT Start Time 1020   ° PT Stop Time 1100   ° PT Time Calculation (min) 40 min   ° Activity Tolerance Patient tolerated treatment well   ° Behavior During Therapy WFL for tasks assessed/performed   ° °  °  ° °  ° ° ° ° °Past Medical History:  °Diagnosis Date  ° Allergy   ° Anemia   ° past hx of anemia  ° Aneurysm (HCC)   ° pseudo-aneurym of carotid arteries per pt  ° Anxiety   ° Aortic atherosclerosis (HCC)   ° Arthritis   ° knee- DJD   ° Barrett's esophagus   ° Breast cancer (HCC)   ° right breast IDC  ° Burning mouth syndrome   ° Dr Bates 11-2016 - no smell or taste x 4 yrs per pt   ° Cataract   ° bilateral   ° Clotting disorder (HCC) 1988  ° 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   ° Eczema   ° Family history of breast cancer   ° Family history of kidney cancer   ° Family history of multiple myeloma   ° Family history of thyroid cancer   ° GERD (gastroesophageal reflux disease)   ° Headache(784.0)   ° History of IBS   ° History of kidney stones   ° Horner's syndrome   ° 1988 pregnancy   ° Hyperlipidemia   ° on medication  ° Hypertension   ° Low back pain   ° Meniere disease   ° Neuromuscular disorder (HCC)   ° raynaud's  ° Osteopenia   ° 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HCC)   ° Stroke (HCC) 1988  ° 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 carotid artery dissection   ° Tubular adenoma of colon 02/2013  ° Varicose veins with inflammation   ° upper and lower per pt   ° Vasculitis (HCC)   ° °Past Surgical History:  °Procedure Laterality Date  ° arthroscopic knee  2009  ° left knee/ torn meniscus  ° BREAST LUMPECTOMY WITH RADIOACTIVE SEED LOCALIZATION Right 11/05/2020  ° Procedure: RIGHT BREAST  LUMPECTOMY WITH RADIOACTIVE SEED LOCALIZATION;  Surgeon: Cornett, Thomas, MD;  Location: [facility];  Service: General;  Laterality: Right;  ° BUNIONECTOMY Right 1998  ° with other foot surgery   ° carotid artery disection  1988  ° Carotid Artery Dissection  ° CATARACT EXTRACTION, BILATERAL  07-18-2017,08-08-2017  ° CESAREAN SECTION  1988  ° 1 time  ° COLONOSCOPY  2019  ° last 2019  ° DILATION AND CURETTAGE OF UTERUS  2004  ° POLYPECTOMY    ° POPLITEAL SYNOVIAL CYST EXCISION    ° left leg  ° TONSILLECTOMY  1957  ° UPPER GASTROINTESTINAL ENDOSCOPY    ° last 2018  ° °Patient Active Problem List  ° Diagnosis Date Noted  ° Abnormal ear sensation, right 02/27/2021  ° Genetic testing 11/10/2020  ° Family history of kidney cancer 10/22/2020  ° Family history of thyroid cancer 10/22/2020  ° Family history of multiple myeloma 10/22/2020  ° Family history of breast cancer 10/22/2020  ° Malignant neoplasm   OUTPATIENT PHYSICAL THERAPY TREATMENT    Patient Name: Lori [name] MRN: [id number] DOB:[birth year], 81 y.o., female Today's Date: 05/27/2021    PT End of Session - 05/27/21 1059     Visit Number 5    Number of Visits 12    Date for PT Re-Evaluation 06/29/21    Authorization Type UHC Medicare/ Re-Eval done at visit 4    PT Start Time 1020    PT Stop Time 1100    PT Time Calculation (min) 40 min    Activity Tolerance Patient tolerated treatment well    Behavior During Therapy WFL for tasks assessed/performed               Past Medical History:  Diagnosis Date   Allergy    Anemia    past hx of anemia   Aneurysm ([place])    pseudo-aneurym of carotid arteries per pt   Anxiety    Aortic atherosclerosis (HCC)    Arthritis    knee- DJD    Barrett's esophagus    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   Burning mouth syndrome    Dr Redmond Baseman 11-2016 - no smell or taste x 4 yrs per pt    Cataract    bilateral    Clotting disorder ([place]) 1988   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    Eczema    Family history of breast cancer    Family history of kidney cancer    Family history of multiple myeloma    Family history of thyroid cancer    GERD (gastroesophageal reflux disease)    Headache(784.0)    History of IBS    History of kidney stones    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    Hyperlipidemia    on medication   Hypertension    Low back pain    Meniere disease    Neuromuscular disorder ([place])    raynaud's   Osteopenia    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[place])    Stroke ([place]) 1988   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 carotid artery dissection    Tubular adenoma of colon 02/2013   Varicose veins with inflammation    upper and lower per pt    Vasculitis ([place])    Past Surgical History:  Procedure Laterality Date   arthroscopic knee  2009   left knee/ torn meniscus   BREAST LUMPECTOMY WITH RADIOACTIVE SEED LOCALIZATION Right 11/05/2020   Procedure: RIGHT BREAST  LUMPECTOMY WITH RADIOACTIVE SEED LOCALIZATION;  Surgeon: Erroll Luna, MD;  Location: [facility];  Service: General;  Laterality: Right;   BUNIONECTOMY Right 1998   with other foot surgery    carotid artery disection  1988   Carotid Artery Dissection   CATARACT EXTRACTION, BILATERAL  07-18-2017,08-08-2017   CESAREAN SECTION  1988   1 time   COLONOSCOPY  2019   last 2019   [place] OF UTERUS  2004   POLYPECTOMY     POPLITEAL SYNOVIAL CYST EXCISION     left leg   TONSILLECTOMY  1957   UPPER GASTROINTESTINAL ENDOSCOPY     last 2018   Patient Active Problem List   Diagnosis Date Noted   Abnormal ear sensation, right 02/27/2021   Genetic testing 11/10/2020   Family history of kidney cancer 10/22/2020   Family history of thyroid cancer 10/22/2020   Family history of multiple myeloma 10/22/2020   Family history of breast cancer 10/22/2020   Malignant  OUTPATIENT PHYSICAL THERAPY TREATMENT    Patient Name: Lori [name] MRN: [id number] DOB:[birth year], 81 y.o., female Today's Date: 05/27/2021    PT End of Session - 05/27/21 1059     Visit Number 5    Number of Visits 12    Date for PT Re-Evaluation 06/29/21    Authorization Type UHC Medicare/ Re-Eval done at visit 4    PT Start Time 1020    PT Stop Time 1100    PT Time Calculation (min) 40 min    Activity Tolerance Patient tolerated treatment well    Behavior During Therapy WFL for tasks assessed/performed               Past Medical History:  Diagnosis Date   Allergy    Anemia    past hx of anemia   Aneurysm ([place])    pseudo-aneurym of carotid arteries per pt   Anxiety    Aortic atherosclerosis (HCC)    Arthritis    knee- DJD    Barrett's esophagus    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   Burning mouth syndrome    Dr Redmond Baseman 11-2016 - no smell or taste x 4 yrs per pt    Cataract    bilateral    Clotting disorder ([place]) 1988   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    Eczema    Family history of breast cancer    Family history of kidney cancer    Family history of multiple myeloma    Family history of thyroid cancer    GERD (gastroesophageal reflux disease)    Headache(784.0)    History of IBS    History of kidney stones    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    Hyperlipidemia    on medication   Hypertension    Low back pain    Meniere disease    Neuromuscular disorder ([place])    raynaud's   Osteopenia    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[place])    Stroke ([place]) 1988   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 carotid artery dissection    Tubular adenoma of colon 02/2013   Varicose veins with inflammation    upper and lower per pt    Vasculitis ([place])    Past Surgical History:  Procedure Laterality Date   arthroscopic knee  2009   left knee/ torn meniscus   BREAST LUMPECTOMY WITH RADIOACTIVE SEED LOCALIZATION Right 11/05/2020   Procedure: RIGHT BREAST  LUMPECTOMY WITH RADIOACTIVE SEED LOCALIZATION;  Surgeon: Erroll Luna, MD;  Location: [facility];  Service: General;  Laterality: Right;   BUNIONECTOMY Right 1998   with other foot surgery    carotid artery disection  1988   Carotid Artery Dissection   CATARACT EXTRACTION, BILATERAL  07-18-2017,08-08-2017   CESAREAN SECTION  1988   1 time   COLONOSCOPY  2019   last 2019   [place] OF UTERUS  2004   POLYPECTOMY     POPLITEAL SYNOVIAL CYST EXCISION     left leg   TONSILLECTOMY  1957   UPPER GASTROINTESTINAL ENDOSCOPY     last 2018   Patient Active Problem List   Diagnosis Date Noted   Abnormal ear sensation, right 02/27/2021   Genetic testing 11/10/2020   Family history of kidney cancer 10/22/2020   Family history of thyroid cancer 10/22/2020   Family history of multiple myeloma 10/22/2020   Family history of breast cancer 10/22/2020   Malignant

## 2021-06-01 ENCOUNTER — Encounter: Payer: Self-pay | Admitting: Family Medicine

## 2021-06-02 ENCOUNTER — Encounter: Payer: Self-pay | Admitting: Physical Therapy

## 2021-06-02 ENCOUNTER — Encounter: Payer: Self-pay | Admitting: Family Medicine

## 2021-06-02 ENCOUNTER — Other Ambulatory Visit: Payer: Self-pay

## 2021-06-02 ENCOUNTER — Ambulatory Visit: Payer: Medicare Other | Admitting: Physical Therapy

## 2021-06-02 DIAGNOSIS — M25512 Pain in left shoulder: Secondary | ICD-10-CM | POA: Diagnosis not present

## 2021-06-02 DIAGNOSIS — M25562 Pain in left knee: Secondary | ICD-10-CM

## 2021-06-02 DIAGNOSIS — M25561 Pain in right knee: Secondary | ICD-10-CM

## 2021-06-02 DIAGNOSIS — G8929 Other chronic pain: Secondary | ICD-10-CM | POA: Diagnosis not present

## 2021-06-02 DIAGNOSIS — H8109 Meniere's disease, unspecified ear: Secondary | ICD-10-CM

## 2021-06-02 NOTE — Therapy (Signed)
?OUTPATIENT PHYSICAL THERAPY TREATMENT  ? ? ?Patient Name: Lori [name] ?MRN: [id number] ?DOB:August 24, 1940, 81 y.o., female ?Today's Date: 06/02/2021 ? ? ? PT End of Session - 06/02/21 1145   ? ? Visit Number 6   ? Number of Visits 12   ? Date for PT Re-Evaluation 06/29/21   ? Authorization Type UHC Medicare/ Re-Eval done at visit 4   ? PT Start Time 1102   ? PT Stop Time 1144   ? PT Time Calculation (min) 42 min   ? Activity Tolerance Patient tolerated treatment well   ? Behavior During Therapy [facility] for tasks assessed/performed   ? ?  ?  ? ?  ? ? ? ? ? ?Past Medical History:  ?Diagnosis Date  ? Allergy   ? Anemia   ? past hx of anemia  ? Aneurysm ([place])   ? pseudo-aneurym of carotid arteries per pt  ? Anxiety   ? Aortic atherosclerosis ([place])   ? Arthritis   ? knee- DJD   ? Barrett's esophagus   ? Breast cancer ([place])   ? right breast IDC  ? Burning mouth syndrome   ? Dr Redmond Baseman [phone] - no smell or taste x 4 yrs per pt   ? Cataract   ? bilateral   ? Clotting disorder ([place]) 1988  ? 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   ? Eczema   ? Family history of breast cancer   ? Family history of kidney cancer   ? Family history of multiple myeloma   ? Family history of thyroid cancer   ? GERD (gastroesophageal reflux disease)   ? Headache(784.0)   ? History of IBS   ? History of kidney stones   ? 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 Hyperlipidemia   ? on medication  ? Hypertension   ? Low back pain   ? Meniere disease   ? Neuromuscular disorder ([place])   ? raynaud's  ? Osteopenia   ? 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HCC)   ? Stroke [facility]  ? 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 carotid artery dissection   ? Tubular adenoma of colon 02/2013  ? Varicose veins with inflammation   ? upper and lower per pt   ? Vasculitis ([place])   ? ?Past Surgical History:  ?Procedure Laterality Date  ? arthroscopic knee  2009  ? left knee/ torn meniscus  ? BREAST LUMPECTOMY WITH RADIOACTIVE SEED LOCALIZATION Right 11/05/2020  ? Procedure: RIGHT BREAST  LUMPECTOMY WITH RADIOACTIVE SEED LOCALIZATION;  Surgeon: Erroll Luna, MD;  Location: [facility];  Service: General;  Laterality: Right;  ? BUNIONECTOMY Right 1998  ? with other foot surgery   ? carotid artery disection  1988  ? Carotid Artery Dissection  ? CATARACT EXTRACTION, BILATERAL  07-18-2017,08-08-2017  ? [place]  ? 1 time  ? COLONOSCOPY  2019  ? last 2019  ? DILATION AND CURETTAGE OF UTERUS  2004  ? POLYPECTOMY    ? POPLITEAL SYNOVIAL CYST EXCISION    ? left leg  ? TONSILLECTOMY  1957  ? UPPER GASTROINTESTINAL ENDOSCOPY    ? last 2018  ? ?Patient Active Problem List  ? Diagnosis Date Noted  ? Abnormal ear sensation, right 02/27/2021  ? Genetic testing 11/10/2020  ? Family history of kidney cancer 10/22/2020  ? Family history of thyroid cancer 10/22/2020  ? Family history of multiple myeloma 10/22/2020  ? Family history of breast cancer 10/22/2020  ? Malignant  neoplasm of lower-inner quadrant of right breast of female, estrogen receptor positive ([place]) 10/17/2020  ? Parotid adenoma 06/26/2020  ? IBS (irritable bowel syndrome) 06/26/2019  ? Osteoarthritis of left knee 09/15/2017  ? 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 Aortic atherosclerosis ([place]) 02/11/2016  ? Hypertension 11/29/2013  ? Eczema 11/29/2013  ? Hyperlipidemia 11/29/2013  ? Barrett's esophagus 08/24/2013  ? GERD (gastroesophageal reflux disease) 05/16/2013  ? Vitamin D deficiency 01/29/2012  ? Solitary pulmonary nodule 06/08/2011  ? HIATAL HERNIA WITH REFLUX 02/06/2010  ? DEGENERATIVE JOINT DISEASE, KNEE 03/14/2008  ? VARICOSE VEINS LOWER EXTREMITIES W/INFLAMMATION 03/09/2007  ? Allergic rhinitis 03/09/2007  ? ACTINIC KERATOSIS, FOREHEAD, LEFT 03/09/2007  ? Headache(784.0) 03/09/2007  ? [place] DISEASE 09/22/2006  ? RAYNAUD'S DISEASE 09/22/2006  ? Osteoporosis 09/22/2006  ? ? ?PCP: [name], MD ? ?REFERRING PROVIDER: [name], MD ? ?REFERRING DIAG: M25.512,G89.29 (ICD-10-CM) - Chronic left shoulder  pain ? ?THERAPY DIAG:  ?Chronic pain of left knee ? ?Acute pain of right knee ? ?Acute pain of left shoulder ? ? ?SUBJECTIVE:                                                                                                                                                                                     ? ?SUBJECTIVE STATEMENT: ?Pt states shoulder doing much better. Knees still sore and stiff at times.  ? ?PERTINENT HISTORY: ?Osteopenia, borderline osteoporosis, hx LBP, Breast CA , Hx of knee surgery, varicose veins,   ? ?PAIN:  ?Are you having pain? Yes ?NPRS scale: 0-4/10 ?Pain location: shoulder  ?Pain orientation: Left  ?PAIN TYPE: aching ?Pain description: intermittent  ?Aggravating factors: reaching, lifting, use.  ?Relieving factors: rest  ? ?Are you having pain? Yes ?NPRS scale: 7-8/10 ?Pain location: Knees  ?Pain orientation: Right and Left  ?PAIN TYPE: aching ?Pain description: intermittent  ?Aggravating factors: standing, walking, sitting too long  ?Relieving factors: none stated  ? ?PRECAUTIONS: None ? ?WEIGHT BEARING RESTRICTIONS No ? ? ?PLOF: Independent ? ?PATIENT GOALS: decreased pain in L shoulder and bil knees.  ? ?OBJECTIVE:  ? ?DIAGNOSTIC FINDINGS:  ?EXAM: 03/12/21 ?Xray LEFT SHOULDER - 2+ VIEW ?IMPRESSION: ?Mild to moderate severity degenerative changes of the left shoulder.EXAM: ? ?THORACIC SPINE - 3 VIEWS ? IMPRESSION: ?Negative. ? ? ?COGNITION: ? Overall cognitive status: Within functional limits for tasks assessed ?    ? ?POSTURE: ?L knee valgus, R knee varus.  ? ? ?UPPER EXTREMITY AROM/PROM: ? ?A/PROM Right ?04/02/2021 Left ?04/02/2021 Left ?05/18/21  ?Shoulder flexion wnl/155 140/145 140/  ?Shoulder extension     ?Shoulder abduction wnl 105/ 145/  ?Shoulder adduction     ?Shoulder internal rotation  wnl wnl  ?Shoulder external rotation  Wnl , pain at end range  wnl  ?     ?     ?LE ROM     ?L knee Flex  125 128  ?L knee Ext   -10 -5  ?R knee Flex  135 135  ?R knee Ext   0 0  ?     ?(Blank  rows = not tested) ? ?UPPER EXTREMITY MMT: ? ?MMT Right ?1/12/203 Left ?04/02/2021 Left ?05/18/21  ?Shoulder flexion  4- 4   ?Shoulder extension     ?Shoulder abduction  4- 4 pain  ?Shoulder adduction     ?IR  4+ 4+  ?ER ?  4  4+  ?     ?     ?     ?     ?     ?     ?     ?     ?Grip strength (lbs)     ?(Blank rows = not tested) ? ? ? ?JOINT MOBILITY TESTING:  ?Hypomobile L knee,  ? ? ?  ?TODAY'S TREATMENT:  ?06/02/21: ?Therapeutic Exercise: ?Aerobic: ?Supine:  SLR x 10 bil;   ?Seated:   sit to stand x 10 no UE support;  ?Standing: Shoulder ER x 20 bil; YTB;  Shoulder scaption x 10 AROM; overhead press 2 lb x 10 bil; Bicep curls x 15, 3 lb bil; Wall push ups x 15;  Step ups fwd x 10 bil, 1 UE support;  Toe taps 6 in no UE support x 20; Stairs, up/down 5 steps x 6o UE support;  ?Stretches:  L Seated HS stretch 30 sec x 3;  ?Neuromuscular Re-education: ?Manual Therapy:    ? ? ? ?05/27/21: ?Therapeutic Exercise: ?Aerobic: ?Supine:  SLR x 10 bil; Quad sets x 10 bil; Shoulder flexion AROM x15; ER butterfly stretch x10; chest press 3 lb x 10 bil;  ?Seated:  ?Standing: Shoulder ER x 20 bil; YTB;  Shoulder scaption x 10 AROM: Bicep curls x 15, 3 lb bil;  Step ups fwd x 10 bil, 1 UE support;  Stairs, up/down 5 steps x 6 no UE support;  ?Stretches:  Seated HS stretch 30 sec x 3;  ?Neuromuscular Re-education: ?Manual Therapy:    ? ? ? ? ?05/18/21:   ?Therapeutic Exercise: ?Aerobic: ?Supine:  ?Seated:  Sit to stand x 10 No UE support; LAQ x 15;  ?Standing:  ?Stretches:  ?Neuromuscular Re-education: ?Manual Therapy:   Joint mobs to inc L knee ext and flex;  ? ?  ? ?PATIENT EDUCATION: ?PATIENT EDUCATION:  ?Education details: Reviewed HEP and PT POC ?Person educated: Patient ?Education method: Explanation, Demonstration, and Handouts ?Education comprehension: verbalized understanding, verbal cues, needs further instruction.  ? ? ?HOME EXERCISE PROGRAM: ?Access Code: [email or web address] ? ? ? ? ?ASSESSMENT: ? ?CLINICAL IMPRESSION: ?06/02/21: Pt  doing very well with shoulder pain and strengthening. Will benefit from one more visit, to review LE strength for HEP, as well as stairs. Pt still challenged with descending stairs, catching heel on step today. Lik

## 2021-06-04 NOTE — Progress Notes (Signed)
? ?Chronic Care Management ?Pharmacy Note ? ?06/12/2021 ?Name:  Lori [name] MRN:  [id number] DOB:  [birth year] ? ?Summary: ?Initial visit with PharmD.  Patient tolerating Crestor once weekly well.  Discussed CV benefit and encouraged her to continue if able.  Still having dizziness - has some FU with audiology upcoming to assess [place].  With dizziness we discussed her most recent DEXA hip fracture risk > 3% warrants treatment.  She declines at this time as in the past but will consider Prolia and plans to discuss with PCP. ? ?Recommendations/Changes made from today's visit: ?Consider Prolia in future - repeat DEXA every 2 years ? ?Plan: ?FU 1 year ? ? ?Subjective: ?Lori [name] is an 81 y.o. year old female who is a primary patient of Hunter, Brayton Mars, MD.  The CCM team was consulted for assistance with disease management and care coordination needs.   ? ?Engaged with patient by telephone for initial visit in response to provider referral for pharmacy case management and/or care coordination services.  ? ?Consent to Services:  ?The patient was given the following information about Chronic Care Management services today, agreed to services, and gave verbal consent: 1. CCM service includes personalized support from designated clinical staff supervised by the primary care provider, including individualized plan of care and coordination with other care providers 2. 24/7 contact phone numbers for assistance for urgent and routine care needs. 3. Service will only be billed when office clinical staff spend 20 minutes or more in a month to coordinate care. 4. Only one practitioner may furnish and bill the service in a calendar month. 5.The patient may stop CCM services at any time (effective at the end of the month) by phone call to the office staff. 6. The patient will be responsible for cost sharing (co-pay) of up to 20% of the service fee (after annual deductible is met). Patient agreed to services and  consent obtained. ? ?Patient Care Team: ?[name], MD as PCP - General (Family Medicine) ?Melida Quitter, MD as Consulting Physician (Otolaryngology) ?Ladene Artist, MD as Consulting Physician (Gastroenterology) ?Harriett Sine, MD as Consulting Physician (Dermatology) ?Erroll Luna, MD as Consulting Physician (General Surgery) ?Nicholas Lose, MD as Consulting Physician (Hematology and Oncology) ?Kyung Rudd, MD as Consulting Physician (Radiation Oncology) ?Edythe Clarity, [facility] as Pharmacist (Pharmacist) ? ?Recent office visits:  ?05/20/2021 OV (PCP) [name], MD; she has read that fenofibrate does not have same mortality benefit (I agree) so we opted to change to rosuvastatin 10 mg weekly and recheck next visit- cautious to make sure she tolerates. ?  ?04/17/2021 OV (Family Medicine) [name], MD; Covid-will do molnupirivir.  Delsym, tessalon ?  ?02/27/2021 OV (Family Medicine) [name], NP; no medication changes indicated. ?  ?02/20/2021 OV (Family Medicine) [name], [place]; Low threshold to start antibiotic if culture positive, develops any symptoms and refer back to urology if needed. ?  ?01/12/2021 OV (PCP) [name], MD;  For UTi- restart nitrofurantoin, start Vitamin B12 1076mg daily for next month then once a week. ?  ?01/02/2021 OV (PCP) HMarin Olp MD; no medication changes indicated. ?  ?Recent consult visits:  ?03/12/2021 OV (Sports Medicine) CGregor Hams MD; She is an excellent candidate for physical therapy.  Plan for physical therapy trial.  Recheck with me in about 6 weeks. ?  ?02/18/2021 OV (Oncology) CGardenia Phlegm NP; no medication changes indicated. ?  ?01/21/2021 OV (Otolaryngology) BHessie Knows MD; no medication changes  indicated. ?  ?Hospital visits:  ?None in previous 6 months ? ? ?Objective: ? ?Lab Results  ?Component Value Date  ? CREATININE 0.69 05/20/2021  ? BUN 24 (H) 05/20/2021  ? GFR 82.67  05/20/2021  ? GFRNONAA >60 10/22/2020  ? GFRAA 99 12/27/2019  ? NA 139 05/20/2021  ? K 4.6 05/20/2021  ? CALCIUM 10.0 05/20/2021  ? CO2 27 05/20/2021  ? GLUCOSE 63 (L) 05/20/2021  ? ? ?Lab Results  ?Component Value Date/Time  ? HGBA1C 6.2 05/20/2021 11:40 AM  ? HGBA1C 6.1 01/02/2021 11:31 AM  ? GFR 82.67 05/20/2021 11:40 AM  ? GFR 85.73 01/02/2021 11:31 AM  ?  ?Last diabetic Eye exam: No results found for: HMDIABEYEEXA  ?Last diabetic Foot exam: No results found for: HMDIABFOOTEX  ? ?Lab Results  ?Component Value Date  ? CHOL 156 01/02/2021  ? HDL 74.80 01/02/2021  ? [place] 74 01/02/2021  ? LDLDIRECT 74.0 02/06/2016  ? TRIG 37.0 01/02/2021  ? CHOLHDL 2 01/02/2021  ? ? ? ?  Latest Ref Rng & Units 05/20/2021  ? 11:40 AM 01/02/2021  ? 11:31 AM 10/22/2020  ?  8:16 AM  ?Hepatic Function  ?Total Protein 6.0 - 8.3 g/dL 8.0   7.7   7.5    ?Albumin 3.5 - 5.2 g/dL 4.4   4.3   3.9    ?AST 0 - 37 U/L _0 ?ALT 0 - 35 U/L _1 ?Alk Phosphatase 39 - 117 U/L 46   46   43    ?Total Bilirubin 0.2 - 1.2 mg/dL 0.4   0.4   0.4    ? ? ?Lab Results  ?Component Value Date/Time  ? TSH 1.21 05/20/2021 11:40 AM  ? TSH 1.97 07/12/2017 08:05 AM  ? FREET4 1.19 11/30/2013 02:20 PM  ? ? ? ?  Latest Ref Rng & Units 05/20/2021  ? 11:40 AM 01/02/2021  ? 11:31 AM 10/22/2020  ?  8:16 AM  ?CBC  ?WBC 4.0 - 10.5 K/uL 5.7   7.4   4.5    ?Hemoglobin 12.0 - 15.0 g/dL 11.9   11.7   11.9    ?Hematocrit 36.0 - 46.0 % 36.6   36.1   36.4    ?Platelets 150.0 - 400.0 K/uL 295.0   290.0   313    ? ? ?Lab Results  ?Component Value Date/Time  ? VD25OH 40.07 01/02/2021 11:31 AM  ? VD25OH 39.37 06/26/2020 10:26 AM  ? ? ?Clinical ASCVD: Yes  ?The 10-year ASCVD risk score (Arnett DK, et al., 2019) is: 36.4% ?  Values used to calculate the score: ?    Age: 81 years ?    Sex: Female ?    Is Non-Hispanic African American: No ?    Diabetic: No ?    Tobacco smoker: No ?    Systolic Blood Pressure: 680 mmHg ?    Is BP treated: Yes ?    HDL Cholesterol: 74.8  mg/dL ?    Total Cholesterol: 156 mg/dL   ? ? ?  01/02/2021  ? 10:30 AM 09/25/2020  ?  2:42 PM 09/03/2020  ? 10:02 AM  ?Depression screen PHQ 2/9  ?Decreased Interest 0 0 0  ?Down, Depressed, Hopeless 0 0 0  ?PHQ - 2 Score 0 0 0  ?Altered sleeping   0  ?Tired, decreased energy   0  ?Change in appetite   0  ?  Feeling bad or failure about yourself    0  ?Trouble concentrating   0  ?Moving slowly or fidgety/restless   0  ?Suicidal thoughts   0  ?PHQ-9 Score   0  ?  ? ?Social History  ? ?Tobacco Use  ?Smoking Status Never  ?Smokeless Tobacco Never  ? ?BP Readings from Last 3 Encounters:  ?05/20/21 136/63  ?03/12/21 (!) 156/84  ?02/27/21 (!) 150/68  ? ?Pulse Readings from Last 3 Encounters:  ?05/20/21 91  ?03/12/21 64  ?02/27/21 74  ? ?Wt Readings from Last 3 Encounters:  ?05/20/21 113 lb 8.6 oz (51.5 kg)  ?03/12/21 114 lb 9.6 oz (52 kg)  ?02/27/21 115 lb 9.6 oz (52.4 kg)  ? ?BMI Readings from Last 3 Encounters:  ?05/20/21 18.33 kg/m?  ?04/17/21 18.50 kg/m?  ?03/12/21 18.50 kg/m?  ? ? ?Assessment/Interventions: Review of patient past medical history, allergies, medications, health status, including review of consultants reports, laboratory and other test data, was performed as part of comprehensive evaluation and provision of chronic care management services.  ? ?SDOH:  (Social Determinants of Health) assessments and interventions performed: Yes ? ?Financial Resource Strain: Low Risk   ? Difficulty of Paying Living Expenses: Not hard at all  ? ?Food Insecurity: No Food Insecurity  ? Worried About Charity fundraiser in the Last Year: Never true  ? Ran Out of Food in the Last Year: Never true  ? ? ?SDOH Screenings  ? ?Alcohol Screen: Low Risk   ? Last Alcohol Screening Score (AUDIT): 0  ?Depression (PHQ2-9): Low Risk   ? PHQ-2 Score: 0  ?Financial Resource Strain: Low Risk   ? Difficulty of Paying Living Expenses: Not hard at all  ?Food Insecurity: No Food Insecurity  ? Worried About Charity fundraiser in the Last Year:  Never true  ? Ran Out of Food in the Last Year: Never true  ?Housing: Low Risk   ? Last Housing Risk Score: 0  ?Physical Activity: Sufficiently Active  ? Days of Exercise per Week: 6 days  ? Minutes of Exercise per Sess

## 2021-06-09 ENCOUNTER — Telehealth: Payer: Self-pay | Admitting: Pharmacist

## 2021-06-09 NOTE — Progress Notes (Signed)
? ? ?Chronic Care Management ?Pharmacy Assistant  ? ?Name: Lori [name]  MRN: [id number] DOB: 19-Jun-1940 ? ?Reason for Encounter: Chart Review For Initial Visit With Clinical Pharmacist ?  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 ?Primary concerns for visit include: ?HTN, GERD, HLD  ? ?Recent office visits:  ?05/20/2021 OV (PCP) Lori Olp, MD; she has read that fenofibrate does not have same mortality benefit (I agree) so we opted to change to rosuvastatin 10 mg weekly and recheck next visit- cautious to make sure she tolerates. ? ?04/17/2021 OV (Family Medicine) Lori Crook, MD; Covid-will do molnupirivir.  Delsym, tessalon ? ?02/27/2021 OV (Family Medicine) Lori Sewer, NP; no medication changes indicated. ? ?02/20/2021 OV (Family Medicine) Lori [name], [place]; Low threshold to start antibiotic if culture positive, develops any symptoms and refer back to urology if needed. ? ?01/12/2021 OV (PCP) Lori Olp, MD;  For UTi- restart nitrofurantoin, start Vitamin B12 1046mg daily for next month then once a week. ? ?01/02/2021 OV (PCP) HMarin Olp MD; no medication changes indicated. ? ?Recent consult visits:  ?03/12/2021 OV (Sports Medicine) Lori Hams MD; She is an excellent candidate for physical therapy.  Plan for physical therapy trial.  Recheck with me in about 6 weeks. ? ?02/18/2021 OV (Oncology) Lori Phlegm NP; no medication changes indicated. ? ?01/21/2021 OV (Otolaryngology) BHessie Knows MD; no medication changes indicated. ? ?Hospital visits:  ?None in previous 6 months ? ?Medications: ?Outpatient Encounter Medications as of 06/09/2021  ?Medication Sig Note  ? amLODipine (NORVASC) 2.5 MG tablet TAKE 1 TABLET BY MOUTH EVERY DAY   ? aspirin 81 MG tablet Take 81 mg by mouth daily. Evening   ? cyanocobalamin 1000 MCG tablet Take 1,000  mcg by mouth daily.   ? letrozole (FEMARA) 2.5 MG tablet Take 1 tablet (2.5 mg total) by mouth daily.   ? loratadine (CLARITIN) 10 MG tablet Take 10 mg by mouth daily. Take 1/2 tablet daily 06/26/2020: Taking 1/2 tablet daily  ? metoprolol tartrate (LOPRESSOR) 50 MG tablet TAKE 1/2 TABLET BY MOUTH 2 TIMES DAILY   ? montelukast (SINGULAIR) 10 MG tablet TAKE 1 TABLET BY MOUTH AT BEDTIME (Patient taking differently: 10 mg.)   ? ondansetron (ZOFRAN-ODT) 4 MG disintegrating tablet Take 1 tablet (4 mg total) by mouth every 8 (eight) hours as needed for nausea or vomiting.   ? pantoprazole (PROTONIX) 40 MG tablet TAKE 1 TABLET BY MOUTH EVERY DAY   ? rosuvastatin (CRESTOR) 10 MG tablet Take 1 tablet (10 mg total) by mouth once a week.   ? Vitamin D, Cholecalciferol, 1000 units TABS Take by mouth daily. Morning 12/01/2018: [employer]   ? ?No facility-administered encounter medications on file as of 06/09/2021.  ? ?Current Medications: ?Pantoprazole 40 mg last filled 05/23/2021 30 DS ?Rosuvastatin 10 mg last filled 05/20/2021 90 DS ?Cyanocobalamin 1000 mcg takes once a week ?Amlodipine 2.5 mg last filled 05/06/2021 90 DS ?Metoprolol Tartrate 50 mg last filled 04/18/2021 90 DS ?Letrozole 2.5 mg last filled 05/09/2021 90 DS ?Montelukast 10 mg last filled 05/21/2021 30 DS ?Ondansetron 4 mg last filled 11/07/2020 ?Vitamin D last filled 04/06/2021 ?Loratadine 10 mg last filed 04/06/2021 ?Aspirin 81 mg last filled 04/06/2021 ? ?Patient Questions: ?Any changes in your medications or health? ?"Dr. HYong Channelchanged fenofibrate to Rosuvastatin 10 mg once a week." ? ?Any side effects from any medications?  ?"Not that I know  of." ? ?Do you have any symptoms or problems not managed by your medications? ?Patient states she has had some issues with being off balance. ? ?Any concerns about your health right now? ?"I had a lumpectomy in August of last year". ? ?Has your provider asked that you check blood pressure, blood sugar, or follow special  diet at home? ?Patient states she checks blood pressure occasionally. ? ?Do you get any type of exercise on a regular basis? ?Patient states she used to walk but hasn't lately due to being off balance. ? ?Can you think of a goal you would like to reach for your health? ?"I would like to get back to my walking." ? ?Do you have any problems getting your medications? ?Patient denies having any problems getting her medications. ? ?Is there anything that you would like to discuss during the appointment?  ?Patient states she would like to discuss if any of her medications could possibly be making her feel off balance. ? ?Please bring medications and supplements to appointment ? ?Care Gaps: ?Medicare Annual Wellness: Completed 09/25/2020 ?Hemoglobin A1C: 6.2% on 05/20/2021 ?Colonoscopy: Completed 05/03/2018 ?Dexa Scan: Completed ?Mammogram: Ordered on 02/18/2021 ? ?Future Appointments  ?Date Time Provider [place]  ?06/10/2021 10:15 AM Lori [name], PT [facility]  ?06/12/2021 11:00 AM [facility] CCM PHARMACIST [facility]  ?08/19/2021 11:15 AM Lori Lose, MD [facility] None  ?10/08/2021  2:30 PM [facility] HEALTH COACH [facility]  ?01/06/2022  9:20 AM Hunter, Brayton Mars, MD [facility]  ? ? ?Star Rating Drugs: ?Rosuvastatin 10 mg last filled 05/20/2021 90 DS ? ?April D Calhoun, [place] ?Clinical Pharmacist Assistant ?[phone]  ?

## 2021-06-10 ENCOUNTER — Ambulatory Visit: Payer: Medicare Other | Admitting: Physical Therapy

## 2021-06-10 ENCOUNTER — Encounter: Payer: Self-pay | Admitting: Physical Therapy

## 2021-06-10 DIAGNOSIS — G8929 Other chronic pain: Secondary | ICD-10-CM | POA: Diagnosis not present

## 2021-06-10 DIAGNOSIS — M25512 Pain in left shoulder: Secondary | ICD-10-CM | POA: Diagnosis not present

## 2021-06-10 DIAGNOSIS — M25562 Pain in left knee: Secondary | ICD-10-CM | POA: Diagnosis not present

## 2021-06-10 DIAGNOSIS — M25561 Pain in right knee: Secondary | ICD-10-CM | POA: Diagnosis not present

## 2021-06-10 NOTE — Therapy (Signed)
?OUTPATIENT PHYSICAL THERAPY TREATMENT /Discharge ? ? ?Patient Name: Lori [name] ?MRN: [id number] ?DOB:1941-02-07, 81 y.o., female ?Today's Date: 06/10/2021 ? ? ? PT End of Session - 06/10/21 1043   ? ? Visit Number 7   ? Number of Visits 12   ? Date for PT Re-Evaluation 06/29/21   ? Authorization Type [employer] Medicare/ Re-Eval done at visit 4   ? PT Start Time 1015   ? PT Stop Time 1055   ? PT Time Calculation (min) 40 min   ? Activity Tolerance Patient tolerated treatment well   ? Behavior During Therapy [facility] for tasks assessed/performed   ? ?  ?  ? ?  ? ? ? ? ? ? ?Past Medical History:  ?Diagnosis Date  ? Allergy   ? Anemia   ? past hx of anemia  ? Aneurysm ([place])   ? pseudo-aneurym of carotid arteries per pt  ? Anxiety   ? Aortic atherosclerosis ([place])   ? Arthritis   ? knee- DJD   ? Barrett's esophagus   ? Breast cancer ([place])   ? right breast IDC  ? Burning mouth syndrome   ? Dr Redmond Baseman [phone] - no smell or taste x 4 yrs per pt   ? Cataract   ? bilateral   ? Clotting disorder ([place]) 1988  ? 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   ? Eczema   ? Family history of breast cancer   ? Family history of kidney cancer   ? Family history of multiple myeloma   ? Family history of thyroid cancer   ? GERD (gastroesophageal reflux disease)   ? Headache(784.0)   ? History of IBS   ? History of kidney stones   ? 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 Hyperlipidemia   ? on medication  ? Hypertension   ? Low back pain   ? Meniere disease   ? Neuromuscular disorder ([place])   ? raynaud's  ? Osteopenia   ? 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HCC)   ? Stroke [facility]) 1988  ? 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 carotid artery dissection   ? Tubular adenoma of colon 02/2013  ? Varicose veins with inflammation   ? upper and lower per pt   ? Vasculitis ([place])   ? ?Past Surgical History:  ?Procedure Laterality Date  ? arthroscopic knee  2009  ? left knee/ torn meniscus  ? BREAST LUMPECTOMY WITH RADIOACTIVE SEED LOCALIZATION Right 11/05/2020  ? Procedure:  RIGHT BREAST LUMPECTOMY WITH RADIOACTIVE SEED LOCALIZATION;  Surgeon: Erroll Luna, MD;  Location: [place];  Service: General;  Laterality: Right;  ? BUNIONECTOMY Right 1998  ? with other foot surgery   ? carotid artery disection  1988  ? Carotid Artery Dissection  ? CATARACT EXTRACTION, BILATERAL  07-18-2017,08-08-2017  ? [place]  ? 1 time  ? COLONOSCOPY  2019  ? last 2019  ? DILATION AND CURETTAGE OF UTERUS  2004  ? POLYPECTOMY    ? POPLITEAL SYNOVIAL CYST EXCISION    ? left leg  ? TONSILLECTOMY  1957  ? UPPER GASTROINTESTINAL ENDOSCOPY    ? last 2018  ? ?Patient Active Problem List  ? Diagnosis Date Noted  ? Abnormal ear sensation, right 02/27/2021  ? Genetic testing 11/10/2020  ? Family history of kidney cancer 10/22/2020  ? Family history of thyroid cancer 10/22/2020  ? Family history of multiple myeloma 10/22/2020  ? Family history of breast cancer 10/22/2020  ?  Malignant neoplasm of lower-inner quadrant of right breast of female, estrogen receptor positive ([place]) 10/17/2020  ? Parotid adenoma 06/26/2020  ? IBS (irritable bowel syndrome) 06/26/2019  ? Osteoarthritis of left knee 09/15/2017  ? 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 Aortic atherosclerosis ([place]) 02/11/2016  ? Hypertension 11/29/2013  ? Eczema 11/29/2013  ? Hyperlipidemia 11/29/2013  ? Barrett's esophagus 08/24/2013  ? GERD (gastroesophageal reflux disease) 05/16/2013  ? Vitamin D deficiency 01/29/2012  ? Solitary pulmonary nodule 06/08/2011  ? HIATAL HERNIA WITH REFLUX 02/06/2010  ? DEGENERATIVE JOINT DISEASE, KNEE 03/14/2008  ? VARICOSE VEINS LOWER EXTREMITIES W/INFLAMMATION 03/09/2007  ? Allergic rhinitis 03/09/2007  ? ACTINIC KERATOSIS, FOREHEAD, LEFT 03/09/2007  ? Headache(784.0) 03/09/2007  ? [place] DISEASE 09/22/2006  ? RAYNAUD'S DISEASE 09/22/2006  ? Osteoporosis 09/22/2006  ? ? ?PCP: [name], MD ? ?REFERRING PROVIDER: [name], MD ? ?REFERRING DIAG: M25.512,G89.29 (ICD-10-CM) - Chronic left  shoulder pain ? ?THERAPY DIAG:  ?Chronic pain of left knee ? ?Acute pain of right knee ? ?Acute pain of left shoulder ? ? ?SUBJECTIVE:                                                                                                                                                                                     ? ?SUBJECTIVE STATEMENT: ?Pt states shoulder doing much better. Knees still sore and stiff at times. L knee more sore today.  ? ?PERTINENT HISTORY: ?Osteopenia, borderline osteoporosis, hx LBP, Breast CA , Hx of knee surgery, varicose veins,   ? ?PAIN:  ?Are you having pain? Yes ?NPRS scale: 0-2/10 ?Pain location: shoulder  ?Pain orientation: Left  ?PAIN TYPE: aching ?Pain description: intermittent  ?Aggravating factors: heavy lifting ?Relieving factors: rest  ? ?Are you having pain? Yes ?NPRS scale:  7-8/10 at worst  ?Pain location: Knees  ?Pain orientation: Right and Left  ?PAIN TYPE: aching ?Pain description: intermittent  ?Aggravating factors: first thing in am.. down much lower during the day.  ?Relieving factors: none stated  ? ?PRECAUTIONS: None ? ?WEIGHT BEARING RESTRICTIONS No ? ? ?PLOF: Independent ? ?PATIENT GOALS: decreased pain in L shoulder and bil knees.  ? ?OBJECTIVE:  ? ?DIAGNOSTIC FINDINGS:  ?EXAM: 03/12/21 ?Xray LEFT SHOULDER - 2+ VIEW ?IMPRESSION: ?Mild to moderate severity degenerative changes of the left shoulder.EXAM: ? ?THORACIC SPINE - 3 VIEWS ? IMPRESSION: ?Negative. ? ? ?COGNITION: ? Overall cognitive status: Within functional limits for tasks assessed ?    ? ?POSTURE: ?L knee valgus, R knee varus.  ? ? ?UPPER EXTREMITY AROM/PROM: ? ?A/PROM Right ?04/02/2021 Left ?04/02/2021 Left ?05/18/21  ?Shoulder flexion wnl/155 140/145 140/  ?Shoulder extension     ?Shoulder abduction wnl  105/ 145/  ?Shoulder adduction     ?Shoulder internal rotation  wnl wnl  ?Shoulder external rotation  Wnl , pain at end range  wnl  ?     ?     ?LE ROM     ?L knee Flex  125 128  ?L knee Ext   -10 -5  ?R knee  Flex  135 135  ?R knee Ext   0 0  ?     ?(Blank rows = not tested) ? ?UPPER EXTREMITY MMT: ? ?MMT Right ?1/12/203 Left ?04/02/2021 Left ?05/18/21  ?Shoulder flexion  4- 4   ?Shoulder extension     ?Shoulder abduction  4- 4 pain  ?Shoulder adduction     ?IR  4+ 4+  ?ER ?  4  4+  ?     ?     ?     ?     ?     ?     ?     ?     ?Grip strength (lbs)     ?(Blank rows = not tested) ? ? ? ?JOINT MOBILITY TESTING:  ?Hypomobile L knee,  ? ? ?  ?TODAY'S TREATMENT:  ? ?06/10/21: ?Therapeutic Exercise: ?Aerobic: Bike L1 x 6 min;  ?Supine:  SLR x 10 bil;  Heel slides x 15;  ?Seated:   sit to stand x 10 no UE support;  ?Standing: Shoulder ER x 20 bil; YTB;  Shoulder scaption x 10 AROM;  Bicep curls x 15, 3 lb bil;  Standing HSC x 15 bil ;  Stairs, up/down 5 steps x 6 with 1 UE support;  ?Stretches:  ?Neuromuscular Re-education: ?Manual Therapy:    ? ? ?06/02/21: ?Therapeutic Exercise: ?Aerobic: ?Supine:  SLR x 10 bil;   ?Seated:   sit to stand x 10 no UE support;  ?Standing: Shoulder ER x 20 bil; YTB;  Shoulder scaption x 10 AROM; overhead press 2 lb x 10 bil; Bicep curls x 15, 3 lb bil; Wall push ups x 15;  Step ups fwd x 10 bil, 1 UE support;  Toe taps 6 in no UE support x 20; Stairs, up/down 5 steps x 6o UE support;  ?Stretches:  L Seated HS stretch 30 sec x 3;  ?Neuromuscular Re-education: ?Manual Therapy:    ? ? ?PATIENT EDUCATION: ?PATIENT EDUCATION:  ?Education details: Reviewed and updated final HEP ?Person educated: Patient ?Education method: Explanation, Demonstration, and Handouts ?Education comprehension: verbalized understanding, verbal cues, needs further instruction.  ? ? ?HOME EXERCISE PROGRAM: ?Final HEP ?Access Code: [email or web address] ?URL: [email or web address] ?Date: 06/10/2021 ?Prepared by: Lyndee Hensen ? ?Exercises ?Supine Heel Slide - 1 x daily - 1-2 sets - 10 reps ?Supine Quad Set - 1 x daily - 1-2 sets - 10 reps ?Straight Leg Raise - 1 x daily - 1 sets - 10 reps ?Seated Long [employer] - 2 sets - 10  reps ?Sit to Stand with Arms Crossed - 2 x daily - 2 sets - 5 reps ?Seated Hamstring Stretch - 2 x daily - 3 reps - 30 hold ?Seated Scapular Retraction - 2 x daily - 1 sets - 10 reps ?Seated Bilateral Shoulder

## 2021-06-12 ENCOUNTER — Ambulatory Visit (INDEPENDENT_AMBULATORY_CARE_PROVIDER_SITE_OTHER): Payer: Medicare Other | Admitting: Pharmacist

## 2021-06-12 DIAGNOSIS — K219 Gastro-esophageal reflux disease without esophagitis: Secondary | ICD-10-CM

## 2021-06-12 DIAGNOSIS — I10 Essential (primary) hypertension: Secondary | ICD-10-CM

## 2021-06-12 DIAGNOSIS — M816 Localized osteoporosis [Lequesne]: Secondary | ICD-10-CM

## 2021-06-12 DIAGNOSIS — E785 Hyperlipidemia, unspecified: Secondary | ICD-10-CM

## 2021-06-12 NOTE — Patient Instructions (Addendum)
Visit Information ? ? Goals Addressed   ? ?  ?  ?  ?  ? This Visit's Progress  ?  Prevent Falls and Broken Bones-Osteoporosis     ?  Timeframe:  Long-Range Goal ?Priority:  High ?Start Date: 06/12/21                            ?Expected End Date: 12/13/21                     ? ?Follow Up Date 09/12/21  ?  ?Consider Prolia.  Be careful ans keep floors clean to prevent falls!  ?  ?Why is this important?   ?When you fall, there are 3 things that control if a bone breaks or not.  ?These are the fall itself, how hard and the direction that you fall and how fragile your bones are.  ?Preventing falls is very important for you because of fragile bones.   ?  ?Notes:  ?  ? ?  ? ?Patient Care Plan: General Pharmacy (Adult)  ?  ? ?Problem Identified: HTN, GERD, IBS, Meniere's Disease, Osteoporosis, HLD   ?Priority: High  ?Onset Date: 06/12/2021  ?  ? ?Long-Range Goal: Patient-Specific Goal   ?Start Date: 06/12/2021  ?Expected End Date: 12/13/2021  ?This Visit's Progress: On track  ?Priority: High  ?Note:   ?Current Barriers:  ?Unable to maintain control of LDL ? ?Pharmacist Clinical Goal(s):  ?Patient will achieve improvement in LDL as evidenced by labs through collaboration with PharmD and provider.  ? ?Interventions: ?1:1 collaboration with [name], MD regarding development and update of comprehensive plan of care as evidenced by provider attestation and co-signature ?Inter-disciplinary care team collaboration (see longitudinal plan of care) ?Comprehensive medication review performed; medication list updated in electronic medical record ? ?Hypertension (BP goal <140/90) ?-Controlled ?-Current treatment: ?Amlodipine 2.'5mg'$  Appropriate, Effective, Safe, Accessible ?Metoprolol '25mg'$  twice daily Appropriate, Effective, Safe, Accessible ?-Medications previously tried: none noted  ?-Current home readings: reports they are normally 130s/60s-70s ?-Current dietary habits: balanced diet ?-Current exercise habits: minimal due to  balance issues lately ?-Denies hypotensive/hypertensive symptoms ?-Educated on BP goals and benefits of medications for prevention of heart attack, stroke and kidney damage; ?Daily salt intake goal < 2300 mg; ?Exercise goal of 150 minutes per week; ?Importance of home blood pressure monitoring; ?-Counseled to monitor BP at home a few times per week, document, and provide log at future appointments ?-Recommended to continue current medication ? ?Hyperlipidemia: (LDL goal < 70) ?-Not ideally controlled ?-Current treatment: ?Rosuvastatin '10mg'$  once per week Appropriate, Query effective, ?-Medications previously tried: fenofibrate  ?-Educated on Cholesterol goals;  ?Benefits of statin for ASCVD risk reduction; ?Importance of limiting foods high in cholesterol; ?-Recommended to continue current medication ?She was previously taking fenofibrate but changed to Crestor due to mortality benefit and normal TG.  Tolerating Crestor well so far with no cramping or muscle aches.  Recommended she continue to take as directed.  Recheck lipids, if elevated would recommend increasing to twice weekly if she can tolerate. ? ?Osteoporosis (Goal Reduce fall/fracture risk) ?-Not ideally controlled ?-Last DEXA Scan: Januart 2023  ? T-Score femoral neck: -2.4 ? T-Score lumbar spine: -2.4 ? 10-year probability of major osteoporotic fracture: 15.0% ? 10-year probability of hip fracture: 5.5% ?-Patient is a candidate for pharmacologic treatment due to T-Score -1.0 to -2.5 and 10-year risk of hip fracture > 3% ?-Current treatment  ?Vitamin D 1000 IU daily  Appropriate, Effective, Safe, Accessible ?-Medications previously tried: none noted  ?-Recommend [phone] units of vitamin D daily. Recommend 1200 mg of calcium daily from dietary and supplemental sources. Recommend weight-bearing and muscle strengthening exercises for building and maintaining bone density. Patient has declined treatment up to this point.  Discussed risk of falls with dizziness  and risk of hip fracture warrants treatment.  At this point she prefers to monitor but said she would consider Prolia.  She is concerned about cost. ?-Recommended continue current management and routine screenings. ? ?GERD/Hiatal Hernia (Goal: Minimize symptoms) ?-Controlled ?-Current treatment  ?Pantoprazole '40mg'$  daily Appropriate, Effective, Safe, Accessible ?-Medications previously tried: none noted ?-Appropriate due to Hiatal hernia.  Denies any current symptoms  ?-Recommended to continue current medication ?She is interested in reaching out to her MD to see about reducing dose because she read about negative impact on bone density.  Would be ok using lowest effective dose. ? ?Meniere's Disease (Goal: Reduce symptoms) ?-Controlled ?-Current treatment  ?None ?-Medications previously tried: Prednisone ?-She is having some dizziness symptoms with ringing in ear currently.  ?-Recommended she follow up with audiology which she already has appt with.  Caution when driving or walking due to risk of fracture. ? ?Allergic Rhinitis (Goal: Reduce symptoms) ?-Controlled ?-Current treatment  ?Loratadine '10mg'$  daily Appropriate, Effective, Safe, Accessible ?Montelukast '10mg'$  daily Appropriate, Effective, Safe, Accessible ?-Medications previously tried: none noted  ?-Still has allergies, symptoms are well controlled ?-Recommended to continue current medication ?No changes needed at this time ? ?Patient Goals/Self-Care Activities ?Patient will:  ?- focus on medication adherence by pill box ?check blood pressure periodically, document, and provide at future appointments ? ?Follow Up Plan: The care management team will reach out to the patient again over the next 365 days.  ?  ? ? ?Ms. Grieshop was given information about Chronic Care Management services today including:  ?CCM service includes personalized support from designated clinical staff supervised by her physician, including individualized plan of care and coordination with  other care providers ?24/7 contact phone numbers for assistance for urgent and routine care needs. ?Standard insurance, coinsurance, copays and deductibles apply for chronic care management only during months in which we provide at least 20 minutes of these services. Most insurances cover these services at 100%, however patients may be responsible for any copay, coinsurance and/or deductible if applicable. This service may help you avoid the need for more expensive face-to-face services. ?Only one practitioner may furnish and bill the service in a calendar month. ?The patient may stop CCM services at any time (effective at the end of the month) by phone call to the office staff. ? ?Patient agreed to services and verbal consent obtained.  ? ?The patient verbalized understanding of instructions, educational materials, and care plan provided today and agreed to receive a mailed copy of patient instructions, educational materials, and care plan.  ?Telephone follow up appointment with pharmacy team member scheduled for:365 days ? ?Edythe Clarity, [facility]  ?Beverly Milch, PharmD ?Clinical Pharmacist  ?[place] [facility] ?([phone] ? ?

## 2021-06-15 ENCOUNTER — Encounter: Payer: Self-pay | Admitting: Family Medicine

## 2021-06-15 ENCOUNTER — Ambulatory Visit (INDEPENDENT_AMBULATORY_CARE_PROVIDER_SITE_OTHER): Payer: Medicare Other | Admitting: Family Medicine

## 2021-06-15 VITALS — BP 140/60 | HR 63 | Temp 97.9°F | Ht 66.0 in | Wt 115.0 lb

## 2021-06-15 DIAGNOSIS — R42 Dizziness and giddiness: Secondary | ICD-10-CM

## 2021-06-15 LAB — SEDIMENTATION RATE: Sed Rate: 45 mm/hr — ABNORMAL HIGH (ref 0–30)

## 2021-06-15 LAB — C-REACTIVE PROTEIN: CRP: <1 mg/dL (ref 0.5–20.0)

## 2021-06-15 NOTE — Patient Instructions (Addendum)
It was very nice to see you today! ? ?Eat small meals freq as you are.   ?Use walker ?Call ENT ? ? ?PLEASE NOTE: ? ?If you had any lab tests please let us know if you have not heard back within a few days. You may see your results on MyChart before we have a chance to review them but we will give you a call once they are reviewed by [place]. If we ordered any referrals today, please let us know if you have not heard from their office within the next week.  ? ?Please try these tips to maintain a healthy lifestyle: ? ?Eat most of your calories during the day when you are active. Eliminate processed foods including packaged sweets (pies, cakes, cookies), reduce intake of potatoes, white bread, white pasta, and white rice. Look for whole grain options, oat flour or almond flour. ? ?Each meal should contain half fruits/vegetables, one quarter protein, and one quarter carbs (no bigger than a computer mouse). ? ?Cut down on sweet beverages. This includes juice, soda, and sweet tea. Also watch fruit intake, though this is a healthier sweet option, it still contains natural sugar! Limit to 3 servings daily. ? ?Drink at least 1 glass of water with each meal and aim for at least 8 glasses per day ? ?Exercise at least 150 minutes every week.   ?

## 2021-06-15 NOTE — Progress Notes (Signed)
? ?Subjective:  ? ? ? Patient ID: Lori [name], female    DOB: Jan 25, 1941, 81 y.o.   MRN: [id number] ? ?Chief Complaint  ?Patient presents with  ? Dizziness  ?  Feel like she can't get her balance, started 05/14/2021, saw Dr. Yong Channel previously ?Had a bad episode on yesterday  ? ? ?HPI ?Dizziness-intermitt for years dx w/Meniers.  On pred for 1 yr for PMR.  Saw Dr Ernesto Rutherford then and high CRP, etc so on pre.   Was better for years, but then 12/2019-had episode of bad vertigo-sent to ER-had MRI/MRA(h/o pseudoaneurysms).  Was ok till recently.   Getting more frequently .  Does have runny nose and sneezing.  Started 05/17/21 and "felt really drunk".  Saw Dr. Yong Channel and some better. Will be Seeing Dr. Redmond Baseman ENT for [facility] 5/18 and seeing Aud prev. Can't get in sooner.  Yesterday was bad.  Today some better.  Clicking in R ear more prominent since Feb.  Off balance,  no HA.  No n/v.   Afraid to drive/walk as can hit at any time. Meclizine not help and drowsy in past.  ? ? ?Health Maintenance Due  ?Topic Date Due  ? Zoster Vaccines- Shingrix (1 of 2) Never done  ? ? ?Past Medical History:  ?Diagnosis Date  ? Allergy   ? Anemia   ? past hx of anemia  ? Aneurysm ([place])   ? pseudo-aneurym of carotid arteries per pt  ? Anxiety   ? Aortic atherosclerosis ([place])   ? Arthritis   ? knee- DJD   ? Barrett's esophagus   ? Breast cancer ([place])   ? right breast IDC  ? Burning mouth syndrome   ? Dr Redmond Baseman [phone] - no smell or taste x 4 yrs per pt   ? Cataract   ? bilateral   ? Clotting disorder ([place]) 1988  ? 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   ? Eczema   ? Family history of breast cancer   ? Family history of kidney cancer   ? Family history of multiple myeloma   ? Family history of thyroid cancer   ? GERD (gastroesophageal reflux disease)   ? Headache(784.0)   ? History of IBS   ? History of kidney stones   ? 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 Hyperlipidemia   ? on medication  ? Hypertension   ? Low back pain   ?  Meniere disease   ? Neuromuscular disorder ([place])   ? raynaud's  ? Osteopenia   ? 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HCC)   ? Stroke [facility]) 1988  ? 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 carotid artery dissection   ? Tubular adenoma of colon 02/2013  ? Varicose veins with inflammation   ? upper and lower per pt   ? Vasculitis ([place])   ? ? ?Past Surgical History:  ?Procedure Laterality Date  ? arthroscopic knee  2009  ? left knee/ torn meniscus  ? BREAST LUMPECTOMY WITH RADIOACTIVE SEED LOCALIZATION Right 11/05/2020  ? Procedure: RIGHT BREAST LUMPECTOMY WITH RADIOACTIVE SEED LOCALIZATION;  Surgeon: Erroll Luna, MD;  Location: [facility];  Service: General;  Laterality: Right;  ? BUNIONECTOMY Right 1998  ? with other foot surgery   ? carotid artery disection  1988  ? Carotid Artery Dissection  ? CATARACT EXTRACTION, BILATERAL  07-18-2017,08-08-2017  ? [place]  ? 1 time  ? COLONOSCOPY  2019  ? last 2019  ? DILATION AND  CURETTAGE OF UTERUS  2004  ? POLYPECTOMY    ? POPLITEAL SYNOVIAL CYST EXCISION    ? left leg  ? TONSILLECTOMY  1957  ? UPPER GASTROINTESTINAL ENDOSCOPY    ? last 2018  ? ? ?Outpatient Medications Prior to Visit  ?Medication Sig Dispense Refill  ? amLODipine (NORVASC) 2.5 MG tablet TAKE 1 TABLET BY MOUTH EVERY DAY 90 tablet 2  ? aspirin 81 MG tablet Take 81 mg by mouth daily. Evening    ? cyanocobalamin 1000 MCG tablet Take 1,000 mcg by mouth once a week.    ? D 1000 25 MCG (1000 UT) capsule SMARTSIG:1 By Mouth    ? letrozole (FEMARA) 2.5 MG tablet Take 1 tablet (2.5 mg total) by mouth daily. 90 tablet 3  ? loratadine (CLARITIN) 10 MG tablet Take 10 mg by mouth daily. Take 1/2 tablet daily    ? metoprolol tartrate (LOPRESSOR) 50 MG tablet TAKE 1/2 TABLET BY MOUTH 2 TIMES DAILY 90 tablet 1  ? montelukast (SINGULAIR) 10 MG tablet TAKE 1 TABLET BY MOUTH AT BEDTIME (Patient taking differently: 10 mg.) 90 tablet 1  ? ondansetron (ZOFRAN-ODT) 4 MG disintegrating tablet Take 1 tablet (4 mg total) by mouth  every 8 (eight) hours as needed for nausea or vomiting. 15 tablet 0  ? pantoprazole (PROTONIX) 40 MG tablet TAKE 1 TABLET BY MOUTH EVERY DAY 90 tablet 1  ? rosuvastatin (CRESTOR) 10 MG tablet Take 1 tablet (10 mg total) by mouth once a week. 13 tablet 3  ? Vitamin D, Cholecalciferol, 1000 units TABS Take by mouth daily. Morning    ? ?No facility-administered medications prior to visit.  ? ? ?Allergies  ?Allergen Reactions  ? Hydrocodone-Acetaminophen   ?  VOMITING  ? Azithromycin Other (See Comments)  ?  Thrush ?  ? Klonopin [Clonazepam]   ?  -august 27th- took one dose and experienced zombie like sensation- states will never take agian  ? Neomycin   ? Nitrofurantoin   ?  Reports tingling on tongue- no swelling  ? Penicillins   ?  REACTION: Arm swelling  ? Pneumococcal Vaccine Polyvalent   ?  ARM REDNESS WITH TENDERNESS  ? Pneumovax [Pneumococcal Polysaccharide Vaccine]   ? ?ROS neg/noncontributory except as noted HPI/below ?Losing wt.  Not eating sugar to prevent DM.    ? ? ?   ?Objective:  ?  ? ?BP 140/60   Pulse 63   Temp 97.9 ?F (36.6 ?C) (Temporal)   Ht $R'5\' 6"'Ly$  (1.676 m)   Wt 115 lb (52.2 kg)   SpO2 97%   BMI 18.56 kg/m?  ?Wt Readings from Last 3 Encounters:  ?06/15/21 115 lb (52.2 kg)  ?05/20/21 113 lb 8.6 oz (51.5 kg)  ?03/12/21 114 lb 9.6 oz (52 kg)  ? ? ?Physical Exam  ? ?Gen: WDWN NAD thin WF ?HEENT: NCAT, conjunctiva not injected, sclera nonicteric. EOMI. No  nystagmus ?TM WNL B, OP moist, no exudates  ?NECK:  supple, no thyromegaly, no nodes, no carotid bruits ?CARDIAC: RRR, S1S2+, no murmur.  ?LUNGS: CTAB. No wheezes ?ABDOMEN:  BS+, soft, NTND, No HSM, no masses ?EXT:  no edema ?MSK: no gross abnormalities.  ?NEURO: A&O x3.  CN II-XII intact.  F-n,RAM,heel/shin, pronator drift all normal ?PSYCH: normal mood. Good eye contact ? ?   ?Assessment & Plan:  ? ?Problem List Items Addressed This Visit   ?None ?Visit Diagnoses   ? ? Vertigo    -  Primary  ? ?  ? Vertigo-awaiting  ENT.  Pt req esr and crp.     Declines valium. exercises ? ?No orders of the defined types were placed in this encounter. ? ? ?Wellington Hampshire, MD ? ?

## 2021-06-19 DIAGNOSIS — E785 Hyperlipidemia, unspecified: Secondary | ICD-10-CM

## 2021-06-19 DIAGNOSIS — I10 Essential (primary) hypertension: Secondary | ICD-10-CM

## 2021-06-19 DIAGNOSIS — M818 Other osteoporosis without current pathological fracture: Secondary | ICD-10-CM

## 2021-06-20 ENCOUNTER — Other Ambulatory Visit: Payer: Self-pay | Admitting: Family Medicine

## 2021-06-23 ENCOUNTER — Encounter: Payer: Self-pay | Admitting: Family Medicine

## 2021-06-23 ENCOUNTER — Other Ambulatory Visit: Payer: Self-pay

## 2021-06-23 DIAGNOSIS — R42 Dizziness and giddiness: Secondary | ICD-10-CM

## 2021-07-08 ENCOUNTER — Encounter (HOSPITAL_COMMUNITY): Payer: Self-pay

## 2021-07-10 ENCOUNTER — Encounter: Payer: Self-pay | Admitting: Rehabilitative and Restorative Service Providers"

## 2021-07-10 ENCOUNTER — Ambulatory Visit: Payer: Medicare Other | Attending: Family Medicine | Admitting: Rehabilitative and Restorative Service Providers"

## 2021-07-10 DIAGNOSIS — R2689 Other abnormalities of gait and mobility: Secondary | ICD-10-CM | POA: Insufficient documentation

## 2021-07-10 DIAGNOSIS — R262 Difficulty in walking, not elsewhere classified: Secondary | ICD-10-CM | POA: Diagnosis not present

## 2021-07-10 DIAGNOSIS — M6281 Muscle weakness (generalized): Secondary | ICD-10-CM | POA: Insufficient documentation

## 2021-07-10 DIAGNOSIS — R42 Dizziness and giddiness: Secondary | ICD-10-CM | POA: Insufficient documentation

## 2021-07-10 NOTE — Therapy (Signed)
?OUTPATIENT PHYSICAL THERAPY VESTIBULAR EVALUATION ? ? ? ? ?Patient Name: Lori [name] ?MRN: [id number] ?DOB:[birth year], 81 y.o., female ?Today's Date: 07/10/2021 ? ?PCP: [name], MD ?REFERRING PROVIDER: [name], MD ? ? PT End of Session - 07/10/21 0939   ? ? Visit Number 1   ? Date for PT Re-Evaluation 09/04/21   ? Authorization Type [employer] Medicare   ? PT Start Time 0930   ? PT Stop Time 1010   ? PT Time Calculation (min) 40 min   ? Activity Tolerance Patient tolerated treatment well   ? Behavior During Therapy [facility] for tasks assessed/performed   ? ?  ?  ? ?  ? ? ?Past Medical History:  ?Diagnosis Date  ? Allergy   ? Anemia   ? past hx of anemia  ? Aneurysm ([place])   ? pseudo-aneurym of carotid arteries per pt  ? Anxiety   ? Aortic atherosclerosis ([place])   ? Arthritis   ? knee- DJD   ? Barrett's esophagus   ? Breast cancer ([place])   ? right breast IDC  ? Burning mouth syndrome   ? Dr Redmond Baseman [phone] - no smell or taste x 4 yrs per pt   ? Cataract   ? bilateral   ? Clotting disorder ([place]) 1988  ? 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   ? Eczema   ? Family history of breast cancer   ? Family history of kidney cancer   ? Family history of multiple myeloma   ? Family history of thyroid cancer   ? GERD (gastroesophageal reflux disease)   ? Headache(784.0)   ? History of IBS   ? History of kidney stones   ? 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 Hyperlipidemia   ? on medication  ? Hypertension   ? Low back pain   ? Meniere disease   ? Neuromuscular disorder ([place])   ? raynaud's  ? Osteopenia   ? 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HCC)   ? Stroke [facility]) 1988  ? 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 carotid artery dissection   ? Tubular adenoma of colon 02/2013  ? Varicose veins with inflammation   ? upper and lower per pt   ? Vasculitis ([place])   ? ?Past Surgical History:  ?Procedure Laterality Date  ? arthroscopic knee  2009  ? left knee/ torn meniscus  ? BREAST LUMPECTOMY WITH RADIOACTIVE SEED LOCALIZATION Right  11/05/2020  ? Procedure: RIGHT BREAST LUMPECTOMY WITH RADIOACTIVE SEED LOCALIZATION;  Surgeon: Erroll Luna, MD;  Location: [facility];  Service: General;  Laterality: Right;  ? BUNIONECTOMY Right 1998  ? with other foot surgery   ? carotid artery disection  1988  ? Carotid Artery Dissection  ? CATARACT EXTRACTION, BILATERAL  07-18-2017,08-08-2017  ? [place]  ? 1 time  ? COLONOSCOPY  2019  ? last 2019  ? DILATION AND CURETTAGE OF UTERUS  2004  ? POLYPECTOMY    ? POPLITEAL SYNOVIAL CYST EXCISION    ? left leg  ? TONSILLECTOMY  1957  ? UPPER GASTROINTESTINAL ENDOSCOPY    ? last 2018  ? ?Patient Active Problem List  ? Diagnosis Date Noted  ? Abnormal ear sensation, right 02/27/2021  ? Genetic testing 11/10/2020  ? Family history of kidney cancer 10/22/2020  ? Family history of thyroid cancer 10/22/2020  ? Family history of multiple myeloma 10/22/2020  ? Family history of breast cancer 10/22/2020  ? Malignant neoplasm of  lower-inner quadrant of right breast of female, estrogen receptor positive ([place]) 10/17/2020  ? Parotid adenoma 06/26/2020  ? IBS (irritable bowel syndrome) 06/26/2019  ? Osteoarthritis of left knee 09/15/2017  ? 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 Aortic atherosclerosis ([place]) 02/11/2016  ? Hypertension 11/29/2013  ? Eczema 11/29/2013  ? Hyperlipidemia 11/29/2013  ? Barrett's esophagus 08/24/2013  ? GERD (gastroesophageal reflux disease) 05/16/2013  ? Vitamin D deficiency 01/29/2012  ? Solitary pulmonary nodule 06/08/2011  ? HIATAL HERNIA WITH REFLUX 02/06/2010  ? DEGENERATIVE JOINT DISEASE, KNEE 03/14/2008  ? VARICOSE VEINS LOWER EXTREMITIES W/INFLAMMATION 03/09/2007  ? Allergic rhinitis 03/09/2007  ? ACTINIC KERATOSIS, FOREHEAD, LEFT 03/09/2007  ? Headache(784.0) 03/09/2007  ? [place] DISEASE 09/22/2006  ? RAYNAUD'S DISEASE 09/22/2006  ? Osteoporosis 09/22/2006  ? ? ?ONSET DATE: 05/17/2021 ? ?REFERRING DIAG: R42 (ICD-10-CM) - Vertigo  ? ?THERAPY DIAG:  ?Vertigo - Plan: PT  plan of care cert/re-cert ? ?Balance problem - Plan: PT plan of care cert/re-cert ? ?Dizziness and giddiness - Plan: PT plan of care cert/re-cert ? ?Muscle weakness (generalized) - Plan: PT plan of care cert/re-cert ? ?Difficulty in walking, not elsewhere classified - Plan: PT plan of care cert/re-cert ? ?SUBJECTIVE:  ? ?SUBJECTIVE STATEMENT: ?Pt reports that she had COVID on 04/17/21.  On Feb 26 bent down and started having vertigo, 2/28 felt her equilibrium was off and has been off since then.  Pt has an appointment scheduled with ENT in May. ?Pt accompanied by: self ? ?PERTINENT HISTORY: Osteopenia, borderline osteoporosis, hx LBP, Breast CA , Hx of knee surgery, varicose veins, COVID on 04/17/21, vertigo back in 12/2019, Mineare's Disease ? ? ?PAIN:  ?Are you having pain? Yes: NPRS scale: 6-7/10 ?Pain location: bilateral knees ?Pain description: shooting ?Aggravating factors: walking ?Relieving factors: rest ? ?PRECAUTIONS: Fall ? ?WEIGHT BEARING RESTRICTIONS No ? ?FALLS: Has patient fallen in last 6 months? No ? ?LIVING ENVIRONMENT: ?Lives with: lives with their spouse ?Lives in: House/apartment ?Stairs: No ?Has following equipment at home: Single point cane, Walker - 2 wheeled, shower chair, and Grab bars ? ?PLOF: Independent and Leisure: likes to walk with her friends in the park, walk her dogs, play with grandchildren ? ?PATIENT GOALS:  To be able to get back to walking and driving. ? ?OBJECTIVE:  ? ?DIAGNOSTIC FINDINGS: To go to ENT and Audiologist in May ? ?COGNITION: ?Overall cognitive status: Within functional limits for tasks assessed ?  ?POSTURE: rounded shoulders and forward head ? ? ?Cervical ROM:   ? ?Active A/PROM (deg) ?07/10/2021  ?Flexion 50  ?Extension 20  ?Right lateral flexion   ?Left lateral flexion   ?Right rotation   ?Left rotation   ?(Blank rows = not tested) ? ?STRENGTH: L shoulder strength of 4/5, R shoulder strength of grossly 4+/5 ? ? ?BED MOBILITY:  ?WFL, but reports that she sometimes  has dizziness when she lies flat ? ?GAIT: ?Gait pattern: antalgic, poor foot clearance- Right, and poor foot clearance- Left ?Distance walked: 50 ?Assistive device utilized: Single point cane ?Level of assistance: SBA ?Comments: Pt with unsteady gait pattern ? ?FUNCTIONAL TESTs:  ?5 times sit to stand: TBD ?Timed up and go (TUG): TBD ?MCTSIB: Condition 1: Avg of 3 trials: 30 sec, Condition 2: Avg of 3 trials: 0 sec, Condition 3: Avg of 3 trials: 0 sec, Condition 4: Avg of 3 trials: 0 sec, and Total Score: 30/120 ? ?PATIENT SURVEYS:  ?[place] TBD ? ? ?VESTIBULAR ASSESSMENT ? ? GENERAL OBSERVATION: Pt with unsteadiness of gait noted with slow  gait velocity ?  ? SYMPTOM BEHAVIOR: ?  Subjective history: Pt has had a history of dizziness and Meni?re's Disease before, but states that this dizziness is different. ?  Non-Vestibular symptoms: changes in hearing and tinnitus ?  Type of dizziness: Imbalance (Disequilibrium) ?  Frequency: daily ?  Duration: throughout the day ?  Aggravating factors: Induced by position change: lying supine and supine to sit and Induced by motion: turning head quickly and driving ?  Relieving factors: head stationary ?  Progression of symptoms: unchanged ? ? OCULOMOTOR EXAM: ?  Ocular Alignment: normal ?  Ocular ROM:  some decreased occular ROM noted ?  Spontaneous Nystagmus: absent ?  Gaze-Induced Nystagmus: absent ?  Smooth Pursuits: intact ?  Saccades: intact ?   ? ? VESTIBULAR - OCULAR REFLEX:  ?  Slow VOR: Positive Bilaterally ?    ? POSITIONAL TESTING: Left Dix-Hallpike: nystagmus noted; Duration: greater than 1 min ?  ?OTHOSTATICS: not done ? ?FUNCTIONAL GAIT:  ?Dynamic Gait Index: TBD ? ? ?VESTIBULAR TREATMENT: ? ?07/10/2021: ?Canalith Repositioning: ?  Epley Left: Number of Reps: 1, Response to Treatment: symptoms improved, and Comment: pt reports feeling less dizzy following one treatment of Epley ?Habituation: ?  Brandt-Daroff: number of reps: 1, Seated Vertical Head Movements: comment:  performed x20 sec, and Seated Horizontal Head Movements: comment: performed x20 sec ? ? ?PATIENT EDUCATION: ?Education details: Issued HEP ?Person educated: Patient ?Education method: Explanation, Demonstration,

## 2021-07-13 ENCOUNTER — Ambulatory Visit: Payer: Medicare Other | Admitting: Family Medicine

## 2021-07-14 ENCOUNTER — Ambulatory Visit: Payer: Medicare Other | Admitting: Rehabilitative and Restorative Service Providers"

## 2021-07-14 ENCOUNTER — Encounter: Payer: Self-pay | Admitting: Rehabilitative and Restorative Service Providers"

## 2021-07-14 DIAGNOSIS — R42 Dizziness and giddiness: Secondary | ICD-10-CM | POA: Diagnosis not present

## 2021-07-14 DIAGNOSIS — M6281 Muscle weakness (generalized): Secondary | ICD-10-CM | POA: Diagnosis not present

## 2021-07-14 DIAGNOSIS — R2689 Other abnormalities of gait and mobility: Secondary | ICD-10-CM

## 2021-07-14 DIAGNOSIS — R262 Difficulty in walking, not elsewhere classified: Secondary | ICD-10-CM

## 2021-07-14 NOTE — Therapy (Signed)
?OUTPATIENT PHYSICAL THERAPY TREATMENT NOTE ? ? ?Patient Name: Lori [name] ?MRN: [id number] ?DOB:21-Mar-1941, 81 y.o., female ?Today's Date: 07/14/2021 ? ?PCP: [name], MD ?REFERRING PROVIDER: [name], MD ? ?END OF SESSION:  ? PT End of Session - 07/14/21 1145   ? ? Visit Number 2   ? Date for PT Re-Evaluation 09/04/21   ? Authorization Type U[employer] Medicare   ? PT Start Time 1145   ? PT Stop Time 1225   ? PT Time Calculation (min) 40 min   ? Activity Tolerance Patient tolerated treatment well   ? Behavior During Therapy [facility] for tasks assessed/performed   ? ?  ?  ? ?  ? ? ?Past Medical History:  ?Diagnosis Date  ? Allergy   ? Anemia   ? past hx of anemia  ? Aneurysm ([place])   ? pseudo-aneurym of carotid arteries per pt  ? Anxiety   ? Aortic atherosclerosis ([place])   ? Arthritis   ? knee- DJD   ? Barrett's esophagus   ? Breast cancer ([place])   ? right breast IDC  ? Burning mouth syndrome   ? Dr Redmond Baseman [phone] - no smell or taste x 4 yrs per pt   ? Cataract   ? bilateral   ? Clotting disorder ([place]) 1988  ? 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   ? Eczema   ? Family history of breast cancer   ? Family history of kidney cancer   ? Family history of multiple myeloma   ? Family history of thyroid cancer   ? GERD (gastroesophageal reflux disease)   ? Headache(784.0)   ? History of IBS   ? History of kidney stones   ? 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 Hyperlipidemia   ? on medication  ? Hypertension   ? Low back pain   ? Meniere disease   ? Neuromuscular disorder ([place])   ? raynaud's  ? Osteopenia   ? 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HCC)   ? Stroke [facility]) 1988  ? 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 carotid artery dissection   ? Tubular adenoma of colon 02/2013  ? Varicose veins with inflammation   ? upper and lower per pt   ? Vasculitis ([place])   ? ?Past Surgical History:  ?Procedure Laterality Date  ? arthroscopic knee  2009  ? left knee/ torn meniscus  ? BREAST LUMPECTOMY WITH RADIOACTIVE SEED LOCALIZATION Right  11/05/2020  ? Procedure: RIGHT BREAST LUMPECTOMY WITH RADIOACTIVE SEED LOCALIZATION;  Surgeon: Erroll Luna, MD;  Location: [place];  Service: General;  Laterality: Right;  ? BUNIONECTOMY Right 1998  ? with other foot surgery   ? carotid artery disection  1988  ? Carotid Artery Dissection  ? CATARACT EXTRACTION, BILATERAL  07-18-2017,08-08-2017  ? [place]  ? 1 time  ? COLONOSCOPY  2019  ? last 2019  ? DILATION AND CURETTAGE OF UTERUS  2004  ? POLYPECTOMY    ? POPLITEAL SYNOVIAL CYST EXCISION    ? left leg  ? TONSILLECTOMY  1957  ? UPPER GASTROINTESTINAL ENDOSCOPY    ? last 2018  ? ?Patient Active Problem List  ? Diagnosis Date Noted  ? Abnormal ear sensation, right 02/27/2021  ? Genetic testing 11/10/2020  ? Family history of kidney cancer 10/22/2020  ? Family history of thyroid cancer 10/22/2020  ? Family history of multiple myeloma 10/22/2020  ? Family history of breast cancer 10/22/2020  ? Malignant  neoplasm of lower-inner quadrant of right breast of female, estrogen receptor positive ([place]) 10/17/2020  ? Parotid adenoma 06/26/2020  ? IBS (irritable bowel syndrome) 06/26/2019  ? Osteoarthritis of left knee 09/15/2017  ? 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 Aortic atherosclerosis ([place]) 02/11/2016  ? Hypertension 11/29/2013  ? Eczema 11/29/2013  ? Hyperlipidemia 11/29/2013  ? Barrett's esophagus 08/24/2013  ? GERD (gastroesophageal reflux disease) 05/16/2013  ? Vitamin D deficiency 01/29/2012  ? Solitary pulmonary nodule 06/08/2011  ? HIATAL HERNIA WITH REFLUX 02/06/2010  ? DEGENERATIVE JOINT DISEASE, KNEE 03/14/2008  ? VARICOSE VEINS LOWER EXTREMITIES W/INFLAMMATION 03/09/2007  ? Allergic rhinitis 03/09/2007  ? ACTINIC KERATOSIS, FOREHEAD, LEFT 03/09/2007  ? Headache(784.0) 03/09/2007  ? [place] DISEASE 09/22/2006  ? RAYNAUD'S DISEASE 09/22/2006  ? Osteoporosis 09/22/2006  ? ? ?REFERRING DIAG: R42 (ICD-10-CM) - Vertigo  ? ?THERAPY DIAG:  ?Vertigo ? ?Balance problem ? ?Dizziness and  giddiness ? ?Muscle weakness (generalized) ? ?Difficulty in walking, not elsewhere classified ? ?PERTINENT HISTORY: Osteopenia, borderline osteoporosis, hx LBP, Breast CA , Hx of knee surgery, varicose veins, COVID on 04/17/21, vertigo back in 12/2019, Meniere's Disease ? ?PRECAUTIONS: Fall ? ?SUBJECTIVE: Pt reports that she has been performing her HEP.  States that she is still having dizziness, but it has gotten better. ? ?PAIN:  ?Are you having pain? Yes: NPRS scale: 4/10 ?Pain location: R knee ?Pain description: sore ?Aggravating factors: pt injured by hitting her knee on a table ?Relieving factors: rest ? ?PATIENT GOALS:  To be able to get back to walking and driving. ? ?OBJECTIVE: (objective measures completed at initial evaluation unless otherwise dated) ? ?DIAGNOSTIC FINDINGS: To go to ENT and Audiologist in May ?  ?COGNITION: ?Overall cognitive status: Within functional limits for tasks assessed ?            ?POSTURE: rounded shoulders and forward head ?  ?  ?Cervical ROM:   ?  ?Active A/PROM (deg) ?07/10/2021  ?Flexion 50  ?Extension 20  ?Right lateral flexion    ?Left lateral flexion    ?Right rotation    ?Left rotation    ?(Blank rows = not tested) ?  ?STRENGTH: L shoulder strength of 4/5, R shoulder strength of grossly 4+/5 ?  ?  ?BED MOBILITY:  ?WFL, but reports that she sometimes has dizziness when she lies flat ?  ?GAIT: ?Gait pattern: antalgic, poor foot clearance- Right, and poor foot clearance- Left ?Distance walked: 50 ?Assistive device utilized: Single point cane ?Level of assistance: SBA ?Comments: Pt with unsteady gait pattern ?  ?FUNCTIONAL TESTs:  ? ?07/14/2021: ?5 times sit to stand: 16.8 sec with arms crossed ?Timed up and go (TUG): 11 sec without assistive device ? ?07/10/2021: ?MCTSIB: Condition 1: Avg of 3 trials: 30 sec, Condition 2: Avg of 3 trials: 0 sec, Condition 3: Avg of 3 trials: 0 sec, Condition 4: Avg of 3 trials: 0 sec, and Total Score: 30/120 ?  ?PATIENT SURVEYS:  ? ?07/14/2021:   Dizziness Handicapped Inventory ?DHI Total Score: 56 / 100 ?Physical Score: 16 / 28 ?Emotional Score: 14 / 36 ?Functional Score: 26 / 36 ?  ?  ?VESTIBULAR ASSESSMENT ?  ?           GENERAL OBSERVATION: Pt with unsteadiness of gait noted with slow gait velocity ?            ?           SYMPTOM BEHAVIOR: ?  Subjective history: Pt has had a history of dizziness and Meni?re's Disease before, but states that this dizziness is different. ?                      Non-Vestibular symptoms: changes in hearing and tinnitus ?                      Type of dizziness: Imbalance (Disequilibrium) ?                      Frequency: daily ?                      Duration: throughout the day ?                      Aggravating factors: Induced by position change: lying supine and supine to sit and Induced by motion: turning head quickly and driving ?                      Relieving factors: head stationary ?                      Progression of symptoms: unchanged ?  ?           OCULOMOTOR EXAM: ?                      Ocular Alignment: normal ?                      Ocular ROM:  some decreased occular ROM noted ?                      Spontaneous Nystagmus: absent ?                      Gaze-Induced Nystagmus: absent ?                      Smooth Pursuits: intact ?                      Saccades: intact ?                       ?  ?           VESTIBULAR - OCULAR REFLEX:  ?                      Slow VOR: Positive Bilaterally ?                                  ?           POSITIONAL TESTING:  ?07/10/2021: Left Dix-Hallpike: nystagmus noted; Duration: greater than 1 min ?07/14/2021:  on Left Dix Hallpike- 10 sec of nystagmus noted, proceeded with Epley.  On right [employer], no nystagmus noted, but pt reported dizziness ?            ?OTHOSTATICS: not done ?  ?FUNCTIONAL GAIT:  ? ?DYNAMIC GAIT INDEX ? 07/14/2021  ?LEVEL SURFACE 2  ?CHANGE IN GAIT SPEED 1  ?GAIT WITH HORIZONTAL HEAD TURNS 0  ?GAIT WITH VERTICAL HEAD TURNS 0  ?GAIT  AND PIVOT TURN 1  ?STEP  OVER OBSTACLE 0  ?STEP AROUND OBSTACLES 1  ?STEPS 1  ?TOTAL 6/24  ? ?  ?  ?VESTIBULAR TREATMENT: ?  ?07/14/2021: ?Reviewed HEP of Nestor Lewandowsky ?Marye Round and Printmaker to

## 2021-07-16 ENCOUNTER — Ambulatory Visit: Payer: Medicare Other | Admitting: Rehabilitative and Restorative Service Providers"

## 2021-07-16 ENCOUNTER — Encounter: Payer: Self-pay | Admitting: Rehabilitative and Restorative Service Providers"

## 2021-07-16 DIAGNOSIS — R2689 Other abnormalities of gait and mobility: Secondary | ICD-10-CM

## 2021-07-16 DIAGNOSIS — R42 Dizziness and giddiness: Secondary | ICD-10-CM

## 2021-07-16 DIAGNOSIS — R262 Difficulty in walking, not elsewhere classified: Secondary | ICD-10-CM | POA: Diagnosis not present

## 2021-07-16 DIAGNOSIS — M6281 Muscle weakness (generalized): Secondary | ICD-10-CM | POA: Diagnosis not present

## 2021-07-16 NOTE — Therapy (Signed)
?OUTPATIENT PHYSICAL THERAPY TREATMENT NOTE ? ? ?Patient Name: Lori [name] ?MRN: [id number] ?DOB:23-Jul-1940, 81 y.o., female ?Today's Date: 07/16/2021 ? ?PCP: [name], MD ?REFERRING PROVIDER: [name], MD ? ?END OF SESSION:  ? PT End of Session - 07/16/21 1238   ? ? Visit Number 3   ? Date for PT Re-Evaluation 09/04/21   ? Authorization Type UHC Medicare   ? PT Start Time 1232   ? PT Stop Time 1310   ? PT Time Calculation (min) 38 min   ? Activity Tolerance Patient tolerated treatment well   ? Behavior During Therapy [facility] for tasks assessed/performed   ? ?  ?  ? ?  ? ? ?Past Medical History:  ?Diagnosis Date  ? Allergy   ? Anemia   ? past hx of anemia  ? Aneurysm ([place])   ? pseudo-aneurym of carotid arteries per pt  ? Anxiety   ? Aortic atherosclerosis ([place])   ? Arthritis   ? knee- DJD   ? Barrett's esophagus   ? Breast cancer ([place])   ? right breast IDC  ? Burning mouth syndrome   ? Dr Redmond Baseman [phone] - no smell or taste x 4 yrs per pt   ? Cataract   ? bilateral   ? Clotting disorder ([place]) 1988  ? 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   ? Eczema   ? Family history of breast cancer   ? Family history of kidney cancer   ? Family history of multiple myeloma   ? Family history of thyroid cancer   ? GERD (gastroesophageal reflux disease)   ? Headache(784.0)   ? History of IBS   ? History of kidney stones   ? 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 Hyperlipidemia   ? on medication  ? Hypertension   ? Low back pain   ? Meniere disease   ? Neuromuscular disorder ([place])   ? raynaud's  ? Osteopenia   ? 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HCC)   ? Stroke [facility]) 1988  ? 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 carotid artery dissection   ? Tubular adenoma of colon 02/2013  ? Varicose veins with inflammation   ? upper and lower per pt   ? Vasculitis ([place])   ? ?Past Surgical History:  ?Procedure Laterality Date  ? arthroscopic knee  2009  ? left knee/ torn meniscus  ? BREAST LUMPECTOMY WITH RADIOACTIVE SEED LOCALIZATION Right  11/05/2020  ? Procedure: RIGHT BREAST LUMPECTOMY WITH RADIOACTIVE SEED LOCALIZATION;  Surgeon: Erroll Luna, MD;  Location: [place];  Service: General;  Laterality: Right;  ? BUNIONECTOMY Right 1998  ? with other foot surgery   ? carotid artery disection  1988  ? Carotid Artery Dissection  ? CATARACT EXTRACTION, BILATERAL  07-18-2017,08-08-2017  ? [place]  ? 1 time  ? COLONOSCOPY  2019  ? last 2019  ? DILATION AND CURETTAGE OF UTERUS  2004  ? POLYPECTOMY    ? POPLITEAL SYNOVIAL CYST EXCISION    ? left leg  ? TONSILLECTOMY  1957  ? UPPER GASTROINTESTINAL ENDOSCOPY    ? last 2018  ? ?Patient Active Problem List  ? Diagnosis Date Noted  ? Abnormal ear sensation, right 02/27/2021  ? Genetic testing 11/10/2020  ? Family history of kidney cancer 10/22/2020  ? Family history of thyroid cancer 10/22/2020  ? Family history of multiple myeloma 10/22/2020  ? Family history of breast cancer 10/22/2020  ? Malignant  neoplasm of lower-inner quadrant of right breast of female, estrogen receptor positive ([place]) 10/17/2020  ? Parotid adenoma 06/26/2020  ? IBS (irritable bowel syndrome) 06/26/2019  ? Osteoarthritis of left knee 09/15/2017  ? 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 Aortic atherosclerosis ([place]) 02/11/2016  ? Hypertension 11/29/2013  ? Eczema 11/29/2013  ? Hyperlipidemia 11/29/2013  ? Barrett's esophagus 08/24/2013  ? GERD (gastroesophageal reflux disease) 05/16/2013  ? Vitamin D deficiency 01/29/2012  ? Solitary pulmonary nodule 06/08/2011  ? HIATAL HERNIA WITH REFLUX 02/06/2010  ? DEGENERATIVE JOINT DISEASE, KNEE 03/14/2008  ? VARICOSE VEINS LOWER EXTREMITIES W/INFLAMMATION 03/09/2007  ? Allergic rhinitis 03/09/2007  ? ACTINIC KERATOSIS, FOREHEAD, LEFT 03/09/2007  ? Headache(784.0) 03/09/2007  ? [place] DISEASE 09/22/2006  ? RAYNAUD'S DISEASE 09/22/2006  ? Osteoporosis 09/22/2006  ? ? ?REFERRING DIAG: R42 (ICD-10-CM) - Vertigo  ? ?THERAPY DIAG:  ?Vertigo ? ?Balance problem ? ?Dizziness and  giddiness ? ?Muscle weakness (generalized) ? ?Difficulty in walking, not elsewhere classified ? ?PERTINENT HISTORY: Osteopenia, borderline osteoporosis, hx LBP, Breast CA , Hx of knee surgery, varicose veins, COVID on 04/17/21, vertigo back in 12/2019, Meniere's Disease ? ?PRECAUTIONS: Fall ? ?SUBJECTIVE: Pt reports that her dizziness does seem to be getting better.  Denies dizziness when she is lying down in bed. ? ?PAIN:  ?Are you having pain? Yes: NPRS scale: 5-6/10 ?Pain location: bilat knee ?Pain description: sore ?Aggravating factors: ambulation in the AM ?Relieving factors: rest ? ?PATIENT GOALS:  To be able to get back to walking and driving. ? ?OBJECTIVE: (objective measures completed at initial evaluation unless otherwise dated) ? ?DIAGNOSTIC FINDINGS: To go to ENT and Audiologist in May ?  ?COGNITION: ?Overall cognitive status: Within functional limits for tasks assessed ?            ?POSTURE: rounded shoulders and forward head ?  ?  ?Cervical ROM:   ?  ?Active A/PROM (deg) ?07/10/2021  ?Flexion 50  ?Extension 20  ?Right lateral flexion    ?Left lateral flexion    ?Right rotation    ?Left rotation    ?(Blank rows = not tested) ?  ?STRENGTH: L shoulder strength of 4/5, R shoulder strength of grossly 4+/5 ?  ?  ?BED MOBILITY:  ?WFL, but reports that she sometimes has dizziness when she lies flat ?  ?GAIT: ?Gait pattern: antalgic, poor foot clearance- Right, and poor foot clearance- Left ?Distance walked: 50 ?Assistive device utilized: Single point cane ?Level of assistance: SBA ?Comments: Pt with unsteady gait pattern ?  ?FUNCTIONAL TESTs:  ? ?07/14/2021: ?5 times sit to stand: 16.8 sec with arms crossed ?Timed up and go (TUG): 11 sec without assistive device ? ?07/10/2021: ?MCTSIB: Condition 1: Avg of 3 trials: 30 sec, Condition 2: Avg of 3 trials: 0 sec, Condition 3: Avg of 3 trials: 0 sec, Condition 4: Avg of 3 trials: 0 sec, and Total Score: 30/120 ?  ?PATIENT SURVEYS:  ? ?07/14/2021:  Dizziness Handicapped  Inventory ?DHI Total Score: 56 / 100 ?Physical Score: 16 / 28 ?Emotional Score: 14 / 36 ?Functional Score: 26 / 36 ?  ?  ?VESTIBULAR ASSESSMENT ?  ?           GENERAL OBSERVATION: Pt with unsteadiness of gait noted with slow gait velocity ?            ?           SYMPTOM BEHAVIOR: ?  Subjective history: Pt has had a history of dizziness and Meni?re's Disease before, but states that this dizziness is different. ?                      Non-Vestibular symptoms: changes in hearing and tinnitus ?                      Type of dizziness: Imbalance (Disequilibrium) ?                      Frequency: daily ?                      Duration: throughout the day ?                      Aggravating factors: Induced by position change: lying supine and supine to sit and Induced by motion: turning head quickly and driving ?                      Relieving factors: head stationary ?                      Progression of symptoms: unchanged ?  ?           OCULOMOTOR EXAM: ?                      Ocular Alignment: normal ?                      Ocular ROM:  some decreased occular ROM noted ?                      Spontaneous Nystagmus: absent ?                      Gaze-Induced Nystagmus: absent ?                      Smooth Pursuits: intact ?                      Saccades: intact ?                       ?  ?           VESTIBULAR - OCULAR REFLEX:  ?                      Slow VOR: Positive Bilaterally ?                                  ?           POSITIONAL TESTING:  ?07/10/2021: Left Dix-Hallpike: nystagmus noted; Duration: greater than 1 min ?07/14/2021:  on Left Dix Hallpike- 10 sec of nystagmus noted, proceeded with Epley.  On right [employer], no nystagmus noted, but pt reported dizziness ?            ?OTHOSTATICS: not done ?  ?FUNCTIONAL GAIT:  ? ?DYNAMIC GAIT INDEX ? 07/14/2021  ?LEVEL SURFACE 2  ?CHANGE IN GAIT SPEED 1  ?GAIT WITH HORIZONTAL HEAD TURNS 0  ?GAIT WITH VERTICAL HEAD TURNS 0  ?GAIT AND PIVOT TURN 1  ?STEP  OVER OBSTACLE 0  ?STEP AROUND OBSTACLES 1  ?STEPS 1  ?TOTAL 6/24  ? ?  ?  ?VESTIBULAR TREATMENT: ?  ?07/16/2021: ?Nustep level 3 x6 min with PT present to discuss status ?VOR 1x1 in sitting for both hor

## 2021-07-17 ENCOUNTER — Other Ambulatory Visit: Payer: Self-pay | Admitting: Family Medicine

## 2021-07-21 ENCOUNTER — Encounter: Payer: Self-pay | Admitting: Rehabilitative and Restorative Service Providers"

## 2021-07-21 ENCOUNTER — Ambulatory Visit: Payer: Medicare Other | Attending: Family Medicine | Admitting: Rehabilitative and Restorative Service Providers"

## 2021-07-21 DIAGNOSIS — R42 Dizziness and giddiness: Secondary | ICD-10-CM | POA: Diagnosis not present

## 2021-07-21 DIAGNOSIS — R2689 Other abnormalities of gait and mobility: Secondary | ICD-10-CM | POA: Diagnosis not present

## 2021-07-21 DIAGNOSIS — M6281 Muscle weakness (generalized): Secondary | ICD-10-CM | POA: Insufficient documentation

## 2021-07-21 DIAGNOSIS — R262 Difficulty in walking, not elsewhere classified: Secondary | ICD-10-CM | POA: Insufficient documentation

## 2021-07-21 NOTE — Therapy (Signed)
?OUTPATIENT PHYSICAL THERAPY TREATMENT NOTE ? ? ?Patient Name: Lori [name] ?MRN: [id number] ?DOB:08/26/1940, 81 y.o., female ?Today's Date: 07/21/2021 ? ?PCP: Hunter, Stephen O, MD ?REFERRING PROVIDER: Hunter, Stephen O, MD ? ?END OF SESSION:  ? PT End of Session - 07/21/21 1105   ? ? Visit Number 4   ? Date for PT Re-Evaluation 09/04/21   ? Authorization Type [employer] Medicare   ? PT Start Time 1100   ? PT Stop Time 1140   ? PT Time Calculation (min) 40 min   ? Activity Tolerance Patient tolerated treatment well   ? Behavior During Therapy WFL for tasks assessed/performed   ? ?  ?  ? ?  ? ? ?Past Medical History:  ?Diagnosis Date  ? Allergy   ? Anemia   ? past hx of anemia  ? Aneurysm (HCC)   ? pseudo-aneurym of carotid arteries per pt  ? Anxiety   ? Aortic atherosclerosis (HCC)   ? Arthritis   ? knee- DJD   ? Barrett's esophagus   ? Breast cancer (HCC)   ? right breast IDC  ? Burning mouth syndrome   ? Dr Bates 11-2016 - no smell or taste x 4 yrs per pt   ? Cataract   ? bilateral   ? Clotting disorder (HCC) 1988  ? 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   ? Eczema   ? Family history of breast cancer   ? Family history of kidney cancer   ? Family history of multiple myeloma   ? Family history of thyroid cancer   ? GERD (gastroesophageal reflux disease)   ? Headache(784.0)   ? History of IBS   ? History of kidney stones   ? 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 Hyperlipidemia   ? on medication  ? Hypertension   ? Low back pain   ? Meniere disease   ? Neuromuscular disorder (HCC)   ? raynaud's  ? Osteopenia   ? 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HCC)   ? Stroke (HCC) 1988  ? 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 carotid artery dissection   ? Tubular adenoma of colon 02/2013  ? Varicose veins with inflammation   ? upper and lower per pt   ? Vasculitis (HCC)   ? ?Past Surgical History:  ?Procedure Laterality Date  ? arthroscopic knee  2009  ? left knee/ torn meniscus  ? BREAST LUMPECTOMY WITH RADIOACTIVE SEED LOCALIZATION Right  11/05/2020  ? Procedure: RIGHT BREAST LUMPECTOMY WITH RADIOACTIVE SEED LOCALIZATION;  Surgeon: Cornett, Thomas, MD;  Location: [facility];  Service: General;  Laterality: Right;  ? BUNIONECTOMY Right 1998  ? with other foot surgery   ? carotid artery disection  1988  ? Carotid Artery Dissection  ? CATARACT EXTRACTION, BILATERAL  07-18-2017,08-08-2017  ? CESAREAN SECTION  1988  ? 1 time  ? COLONOSCOPY  2019  ? last 2019  ? DILATION AND CURETTAGE OF UTERUS  2004  ? POLYPECTOMY    ? POPLITEAL SYNOVIAL CYST EXCISION    ? left leg  ? TONSILLECTOMY  1957  ? UPPER GASTROINTESTINAL ENDOSCOPY    ? last 2018  ? ?Patient Active Problem List  ? Diagnosis Date Noted  ? Abnormal ear sensation, right 02/27/2021  ? Genetic testing 11/10/2020  ? Family history of kidney cancer 10/22/2020  ? Family history of thyroid cancer 10/22/2020  ? Family history of multiple myeloma 10/22/2020  ? Family history of breast cancer 10/22/2020  ? Malignant   neoplasm of lower-inner quadrant of right breast of female, estrogen receptor positive (HCC) 10/17/2020  ? Parotid adenoma 06/26/2020  ? IBS (irritable bowel syndrome) 06/26/2019  ? Osteoarthritis of left knee 09/15/2017  ? 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 Aortic atherosclerosis (HCC) 02/11/2016  ? Hypertension 11/29/2013  ? Eczema 11/29/2013  ? Hyperlipidemia 11/29/2013  ? Barrett's esophagus 08/24/2013  ? GERD (gastroesophageal reflux disease) 05/16/2013  ? Vitamin D deficiency 01/29/2012  ? Solitary pulmonary nodule 06/08/2011  ? HIATAL HERNIA WITH REFLUX 02/06/2010  ? DEGENERATIVE JOINT DISEASE, KNEE 03/14/2008  ? VARICOSE VEINS LOWER EXTREMITIES W/INFLAMMATION 03/09/2007  ? Allergic rhinitis 03/09/2007  ? ACTINIC KERATOSIS, FOREHEAD, LEFT 03/09/2007  ? Headache(784.0) 03/09/2007  ? MENIERE'S DISEASE 09/22/2006  ? RAYNAUD'S DISEASE 09/22/2006  ? Osteoporosis 09/22/2006  ? ? ?REFERRING DIAG: R42 (ICD-10-CM) - Vertigo  ? ?THERAPY DIAG:  ?Vertigo ? ?Balance problem ? ?Dizziness and  giddiness ? ?Muscle weakness (generalized) ? ?Difficulty in walking, not elsewhere classified ? ?PERTINENT HISTORY: Osteopenia, borderline osteoporosis, hx LBP, Breast CA , Hx of knee surgery, varicose veins, COVID on 04/17/21, vertigo back in 12/2019, Meniere's Disease ? ?PRECAUTIONS: Fall ? ?SUBJECTIVE: Pt reports that she continues to feel better ? ?PAIN:  ?Are you having pain? Yes: NPRS scale: 7/10 ?Pain location: bilat knee ?Pain description: sore ?Aggravating factors: ambulation in the AM ?Relieving factors: rest ? ?PATIENT GOALS:  To be able to get back to walking and driving. ? ?OBJECTIVE: (objective measures completed at initial evaluation unless otherwise dated) ? ?DIAGNOSTIC FINDINGS: To go to ENT and Audiologist in May ?  ?COGNITION: ?Overall cognitive status: Within functional limits for tasks assessed ?            ?POSTURE: rounded shoulders and forward head ?  ?  ?Cervical ROM:   ?  ?Active A/PROM (deg) ?07/10/2021  ?Flexion 50  ?Extension 20  ?Right lateral flexion    ?Left lateral flexion    ?Right rotation    ?Left rotation    ?(Blank rows = not tested) ?  ?STRENGTH: L shoulder strength of 4/5, R shoulder strength of grossly 4+/5 ?  ?  ?BED MOBILITY:  ?WFL, but reports that she sometimes has dizziness when she lies flat ?  ?GAIT: ?Gait pattern: antalgic, poor foot clearance- Right, and poor foot clearance- Left ?Distance walked: 50 ?Assistive device utilized: Single point cane ?Level of assistance: SBA ?Comments: Pt with unsteady gait pattern ?  ?FUNCTIONAL TESTs:  ? ?07/21/2021: ?MCTSIB: Condition 1: Avg of 3 trials: 30 sec, Condition 2: Avg of 3 trials: 30 sec, Condition 3: Avg of 3 trials: 30 sec, Condition 4: Avg of 3 trials: 28 sec, and Total Score: 118/120 ? ?07/14/2021: ?5 times sit to stand: 16.8 sec with arms crossed ?Timed up and go (TUG): 11 sec without assistive device ? ?07/10/2021: ?MCTSIB: Condition 1: Avg of 3 trials: 30 sec, Condition 2: Avg of 3 trials: 0 sec, Condition 3: Avg of 3  trials: 0 sec, Condition 4: Avg of 3 trials: 0 sec, and Total Score: 30/120 ?  ?PATIENT SURVEYS:  ? ?07/14/2021:  Dizziness Handicapped Inventory ?DHI Total Score: 56 / 100 ?Physical Score: 16 / 28 ?Emotional Score: 14 / 36 ?Functional Score: 26 / 36 ?  ?  ?VESTIBULAR ASSESSMENT ?  ?           GENERAL OBSERVATION: Pt with unsteadiness of gait noted with slow gait velocity ?            ?             SYMPTOM BEHAVIOR: ?                      Subjective history: Pt has had a history of dizziness and Meni?re's Disease before, but states that this dizziness is different. ?                      Non-Vestibular symptoms: changes in hearing and tinnitus ?                      Type of dizziness: Imbalance (Disequilibrium) ?                      Frequency: daily ?                      Duration: throughout the day ?                      Aggravating factors: Induced by position change: lying supine and supine to sit and Induced by motion: turning head quickly and driving ?                      Relieving factors: head stationary ?                      Progression of symptoms: unchanged ?  ?           OCULOMOTOR EXAM: ?                      Ocular Alignment: normal ?                      Ocular ROM:  some decreased occular ROM noted ?                      Spontaneous Nystagmus: absent ?                      Gaze-Induced Nystagmus: absent ?                      Smooth Pursuits: intact ?                      Saccades: intact ?                       ?  ?           VESTIBULAR - OCULAR REFLEX:  ?                      Slow VOR: Positive Bilaterally ?                                  ?           POSITIONAL TESTING:  ?07/10/2021: Left Dix-Hallpike: nystagmus noted; Duration: greater than 1 min ?07/14/2021:  on Left Dix Hallpike- 10 sec of nystagmus noted, proceeded with Epley.  On right [employer], no nystagmus noted, but pt reported dizziness ?            ?OTHOSTATICS: not done ?  ?FUNCTIONAL GAIT:  ? ?DYNAMIC GAIT INDEX ? 07/14/2021  ?LEVEL  SURFACE 2  ?CHANGE IN GAIT  SPEED 1  ?GAIT WITH HORIZONTAL HEAD TURNS 0  ?GAIT WITH VERTICAL HEAD TURNS 0  ?GAIT AND PIVOT TURN 1  ?STEP OVER OBSTACLE 0  ?STEP AROUND OBSTACLES 1  ?STEPS 1  ?TOTAL 6/24  ? ?

## 2021-07-23 ENCOUNTER — Encounter: Payer: Self-pay | Admitting: Rehabilitative and Restorative Service Providers"

## 2021-07-23 ENCOUNTER — Ambulatory Visit: Payer: Medicare Other | Admitting: Rehabilitative and Restorative Service Providers"

## 2021-07-23 DIAGNOSIS — R2689 Other abnormalities of gait and mobility: Secondary | ICD-10-CM

## 2021-07-23 DIAGNOSIS — M6281 Muscle weakness (generalized): Secondary | ICD-10-CM

## 2021-07-23 DIAGNOSIS — R262 Difficulty in walking, not elsewhere classified: Secondary | ICD-10-CM | POA: Diagnosis not present

## 2021-07-23 DIAGNOSIS — R42 Dizziness and giddiness: Secondary | ICD-10-CM

## 2021-07-23 NOTE — Therapy (Signed)
?OUTPATIENT PHYSICAL THERAPY TREATMENT NOTE ? ? ?Patient Name: Lori [name] ?MRN: [id number] ?DOB:10/11/1940, 80 y.o., female ?Today's Date: 07/23/2021 ? ?PCP: Hunter, Stephen O, MD ?REFERRING PROVIDER: Hunter, Stephen O, MD ? ?END OF SESSION:  ? PT End of Session - 07/23/21 1017   ? ? Visit Number 5   ? Date for PT Re-Evaluation 09/04/21   ? Authorization Type [employer] Medicare   ? PT Start Time 1015   ? PT Stop Time 1055   ? PT Time Calculation (min) 40 min   ? Activity Tolerance Patient tolerated treatment well   ? Behavior During Therapy WFL for tasks assessed/performed   ? ?  ?  ? ?  ? ? ?Past Medical History:  ?Diagnosis Date  ? Allergy   ? Anemia   ? past hx of anemia  ? Aneurysm (HCC)   ? pseudo-aneurym of carotid arteries per pt  ? Anxiety   ? Aortic atherosclerosis (HCC)   ? Arthritis   ? knee- DJD   ? Barrett's esophagus   ? Breast cancer (HCC)   ? right breast IDC  ? Burning mouth syndrome   ? Dr Bates 11-2016 - no smell or taste x 4 yrs per pt   ? Cataract   ? bilateral   ? Clotting disorder (HCC) 1988  ? 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   ? Eczema   ? Family history of breast cancer   ? Family history of kidney cancer   ? Family history of multiple myeloma   ? Family history of thyroid cancer   ? GERD (gastroesophageal reflux disease)   ? Headache(784.0)   ? History of IBS   ? History of kidney stones   ? 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 Hyperlipidemia   ? on medication  ? Hypertension   ? Low back pain   ? Meniere disease   ? Neuromuscular disorder (HCC)   ? raynaud's  ? Osteopenia   ? 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HCC)   ? Stroke (HCC) 1988  ? 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 carotid artery dissection   ? Tubular adenoma of colon 02/2013  ? Varicose veins with inflammation   ? upper and lower per pt   ? Vasculitis (HCC)   ? ?Past Surgical History:  ?Procedure Laterality Date  ? arthroscopic knee  2009  ? left knee/ torn meniscus  ? BREAST LUMPECTOMY WITH RADIOACTIVE SEED LOCALIZATION Right  11/05/2020  ? Procedure: RIGHT BREAST LUMPECTOMY WITH RADIOACTIVE SEED LOCALIZATION;  Surgeon: Cornett, Thomas, MD;  Location: [facility];  Service: General;  Laterality: Right;  ? BUNIONECTOMY Right 1998  ? with other foot surgery   ? carotid artery disection  1988  ? Carotid Artery Dissection  ? CATARACT EXTRACTION, BILATERAL  07-18-2017,08-08-2017  ? CESAREAN SECTION  1988  ? 1 time  ? COLONOSCOPY  2019  ? last 2019  ? DILATION AND CURETTAGE OF UTERUS  2004  ? POLYPECTOMY    ? POPLITEAL SYNOVIAL CYST EXCISION    ? left leg  ? TONSILLECTOMY  1957  ? UPPER GASTROINTESTINAL ENDOSCOPY    ? last 2018  ? ?Patient Active Problem List  ? Diagnosis Date Noted  ? Abnormal ear sensation, right 02/27/2021  ? Genetic testing 11/10/2020  ? Family history of kidney cancer 10/22/2020  ? Family history of thyroid cancer 10/22/2020  ? Family history of multiple myeloma 10/22/2020  ? Family history of breast cancer 10/22/2020  ? Malignant   neoplasm of lower-inner quadrant of right breast of female, estrogen receptor positive ([place]) 10/17/2020  ? Parotid adenoma 06/26/2020  ? IBS (irritable bowel syndrome) 06/26/2019  ? Osteoarthritis of left knee 09/15/2017  ? 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 Aortic atherosclerosis ([place]) 02/11/2016  ? Hypertension 11/29/2013  ? Eczema 11/29/2013  ? Hyperlipidemia 11/29/2013  ? Barrett's esophagus 08/24/2013  ? GERD (gastroesophageal reflux disease) 05/16/2013  ? Vitamin D deficiency 01/29/2012  ? Solitary pulmonary nodule 06/08/2011  ? HIATAL HERNIA WITH REFLUX 02/06/2010  ? DEGENERATIVE JOINT DISEASE, KNEE 03/14/2008  ? VARICOSE VEINS LOWER EXTREMITIES W/INFLAMMATION 03/09/2007  ? Allergic rhinitis 03/09/2007  ? ACTINIC KERATOSIS, FOREHEAD, LEFT 03/09/2007  ? Headache(784.0) 03/09/2007  ? [place] DISEASE 09/22/2006  ? RAYNAUD'S DISEASE 09/22/2006  ? Osteoporosis 09/22/2006  ? ? ?REFERRING DIAG: R42 (ICD-10-CM) - Vertigo  ? ?THERAPY DIAG:  ?Vertigo ? ?Balance problem ? ?Dizziness and  giddiness ? ?Muscle weakness (generalized) ? ?Difficulty in walking, not elsewhere classified ? ?PERTINENT HISTORY: Osteopenia, borderline osteoporosis, hx LBP, Breast CA , Hx of knee surgery, varicose veins, COVID on 04/17/21, vertigo back in 12/2019, Meniere's Disease ? ?PRECAUTIONS: Fall ? ?SUBJECTIVE: Pt reports that she is feeling "off balance" today ? ?PAIN:  ?Are you having pain? Yes: NPRS scale: 6-7/10 ?Pain location: bilat knee ?Pain description: sore ?Aggravating factors: ambulation in the AM ?Relieving factors: rest ? ?PATIENT GOALS:  To be able to get back to walking and driving. ? ?OBJECTIVE: (objective measures completed at initial evaluation unless otherwise dated) ? ?DIAGNOSTIC FINDINGS: To go to ENT and Audiologist in May ?  ?COGNITION: ?Overall cognitive status: Within functional limits for tasks assessed ?            ?POSTURE: rounded shoulders and forward head ?  ?  ?Cervical ROM:   ?  ?Active A/PROM (deg) ?07/10/2021  ?Flexion 50  ?Extension 20  ?Right lateral flexion    ?Left lateral flexion    ?Right rotation    ?Left rotation    ?(Blank rows = not tested) ?  ?STRENGTH: L shoulder strength of 4/5, R shoulder strength of grossly 4+/5 ?  ?  ?BED MOBILITY:  ?WFL, but reports that she sometimes has dizziness when she lies flat ?  ?GAIT: ?Gait pattern: antalgic, poor foot clearance- Right, and poor foot clearance- Left ?Distance walked: 50 ?Assistive device utilized: Single point cane ?Level of assistance: SBA ?Comments: Pt with unsteady gait pattern ?  ?FUNCTIONAL TESTs:  ? ?07/21/2021: ?MCTSIB: Condition 1: Avg of 3 trials: 30 sec, Condition 2: Avg of 3 trials: 30 sec, Condition 3: Avg of 3 trials: 30 sec, Condition 4: Avg of 3 trials: 28 sec, and Total Score: 118/120 ? ?07/14/2021: ?5 times sit to stand: 16.8 sec with arms crossed ?Timed up and go (TUG): 11 sec without assistive device ? ?07/10/2021: ?MCTSIB: Condition 1: Avg of 3 trials: 30 sec, Condition 2: Avg of 3 trials: 0 sec, Condition 3: Avg  of 3 trials: 0 sec, Condition 4: Avg of 3 trials: 0 sec, and Total Score: 30/120 ?  ?PATIENT SURVEYS:  ? ?07/14/2021:  Dizziness Handicapped Inventory ?DHI Total Score: 56 / 100 ?Physical Score: 16 / 28 ?Emotional Score: 14 / 36 ?Functional Score: 26 / 36 ?  ?  ?VESTIBULAR ASSESSMENT ?  ?           GENERAL OBSERVATION: Pt with unsteadiness of gait noted with slow gait velocity ?            ?  SYMPTOM BEHAVIOR: ?                      Subjective history: Pt has had a history of dizziness and Meni?re's Disease before, but states that this dizziness is different. ?                      Non-Vestibular symptoms: changes in hearing and tinnitus ?                      Type of dizziness: Imbalance (Disequilibrium) ?                      Frequency: daily ?                      Duration: throughout the day ?                      Aggravating factors: Induced by position change: lying supine and supine to sit and Induced by motion: turning head quickly and driving ?                      Relieving factors: head stationary ?                      Progression of symptoms: unchanged ?  ?           OCULOMOTOR EXAM: ?                      Ocular Alignment: normal ?                      Ocular ROM:  some decreased occular ROM noted ?                      Spontaneous Nystagmus: absent ?                      Gaze-Induced Nystagmus: absent ?                      Smooth Pursuits: intact ?                      Saccades: intact ?                       ?  ?           VESTIBULAR - OCULAR REFLEX:  ?                      Slow VOR: Positive Bilaterally ?                                  ?           POSITIONAL TESTING:  ?07/10/2021: Left Dix-Hallpike: nystagmus noted; Duration: greater than 1 min ?07/14/2021:  on Left Dix Hallpike- 10 sec of nystagmus noted, proceeded with Epley.  On right [employer], no nystagmus noted, but pt reported dizziness ?07/23/2021:  on Left Dix Hallpike- 5-10 sec of nystagmus noted, proceeded with Epley ?             ?OTHOSTATICS: not done ?  ?FUNCTIONAL GAIT:  ? ?  DYNAMIC GAIT INDEX ? 07/14/2021  ?LEVEL SURFACE 2  ?CHANGE IN GAIT SPEED 1  ?GAIT WITH HORIZONTAL HEAD TURNS 0  ?GAIT WITH VERTICAL HEAD TURNS 0  ?GAIT AND

## 2021-07-27 ENCOUNTER — Encounter: Payer: Self-pay | Admitting: Rehabilitative and Restorative Service Providers"

## 2021-07-27 ENCOUNTER — Ambulatory Visit: Payer: Medicare Other | Admitting: Rehabilitative and Restorative Service Providers"

## 2021-07-27 DIAGNOSIS — R42 Dizziness and giddiness: Secondary | ICD-10-CM

## 2021-07-27 DIAGNOSIS — M6281 Muscle weakness (generalized): Secondary | ICD-10-CM

## 2021-07-27 DIAGNOSIS — R262 Difficulty in walking, not elsewhere classified: Secondary | ICD-10-CM | POA: Diagnosis not present

## 2021-07-27 DIAGNOSIS — R2689 Other abnormalities of gait and mobility: Secondary | ICD-10-CM

## 2021-07-27 NOTE — Therapy (Signed)
?OUTPATIENT PHYSICAL THERAPY TREATMENT NOTE ? ? ?Patient Name: Lori [name] ?MRN: [id number] ?DOB:1941/03/12, 81 y.o., female ?Today's Date: 07/27/2021 ? ?PCP: [name], MD ?REFERRING PROVIDER: [name], MD ? ?END OF SESSION:  ? PT End of Session - 07/27/21 1058   ? ? Visit Number 6   ? Date for PT Re-Evaluation 09/04/21   ? Authorization Type UHC Medicare   ? PT Start Time 1055   ? PT Stop Time 1135   ? PT Time Calculation (min) 40 min   ? Activity Tolerance Patient tolerated treatment well   ? Behavior During Therapy [facility] for tasks assessed/performed   ? ?  ?  ? ?  ? ? ?Past Medical History:  ?Diagnosis Date  ? Allergy   ? Anemia   ? past hx of anemia  ? Aneurysm ([place])   ? pseudo-aneurym of carotid arteries per pt  ? Anxiety   ? Aortic atherosclerosis ([place])   ? Arthritis   ? knee- DJD   ? Barrett's esophagus   ? Breast cancer ([place])   ? right breast IDC  ? Burning mouth syndrome   ? Dr Redmond Baseman [phone] - no smell or taste x 4 yrs per pt   ? Cataract   ? bilateral   ? Clotting disorder ([place]) 1988  ? 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   ? Eczema   ? Family history of breast cancer   ? Family history of kidney cancer   ? Family history of multiple myeloma   ? Family history of thyroid cancer   ? GERD (gastroesophageal reflux disease)   ? Headache(784.0)   ? History of IBS   ? History of kidney stones   ? 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 Hyperlipidemia   ? on medication  ? Hypertension   ? Low back pain   ? Meniere disease   ? Neuromuscular disorder ([place])   ? raynaud's  ? Osteopenia   ? 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HCC)   ? Stroke [facility]) 1988  ? 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 carotid artery dissection   ? Tubular adenoma of colon 02/2013  ? Varicose veins with inflammation   ? upper and lower per pt   ? Vasculitis ([place])   ? ?Past Surgical History:  ?Procedure Laterality Date  ? arthroscopic knee  2009  ? left knee/ torn meniscus  ? BREAST LUMPECTOMY WITH RADIOACTIVE SEED LOCALIZATION Right  11/05/2020  ? Procedure: RIGHT BREAST LUMPECTOMY WITH RADIOACTIVE SEED LOCALIZATION;  Surgeon: Erroll Luna, MD;  Location: [place];  Service: General;  Laterality: Right;  ? BUNIONECTOMY Right 1998  ? with other foot surgery   ? carotid artery disection  1988  ? Carotid Artery Dissection  ? CATARACT EXTRACTION, BILATERAL  07-18-2017,08-08-2017  ? [place]  ? 1 time  ? COLONOSCOPY  2019  ? last 2019  ? DILATION AND CURETTAGE OF UTERUS  2004  ? POLYPECTOMY    ? POPLITEAL SYNOVIAL CYST EXCISION    ? left leg  ? TONSILLECTOMY  1957  ? UPPER GASTROINTESTINAL ENDOSCOPY    ? last 2018  ? ?Patient Active Problem List  ? Diagnosis Date Noted  ? Abnormal ear sensation, right 02/27/2021  ? Genetic testing 11/10/2020  ? Family history of kidney cancer 10/22/2020  ? Family history of thyroid cancer 10/22/2020  ? Family history of multiple myeloma 10/22/2020  ? Family history of breast cancer 10/22/2020  ? Malignant  neoplasm of lower-inner quadrant of right breast of female, estrogen receptor positive ([place]) 10/17/2020  ? Parotid adenoma 06/26/2020  ? IBS (irritable bowel syndrome) 06/26/2019  ? Osteoarthritis of left knee 09/15/2017  ? 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 Aortic atherosclerosis ([place]) 02/11/2016  ? Hypertension 11/29/2013  ? Eczema 11/29/2013  ? Hyperlipidemia 11/29/2013  ? Barrett's esophagus 08/24/2013  ? GERD (gastroesophageal reflux disease) 05/16/2013  ? Vitamin D deficiency 01/29/2012  ? Solitary pulmonary nodule 06/08/2011  ? HIATAL HERNIA WITH REFLUX 02/06/2010  ? DEGENERATIVE JOINT DISEASE, KNEE 03/14/2008  ? VARICOSE VEINS LOWER EXTREMITIES W/INFLAMMATION 03/09/2007  ? Allergic rhinitis 03/09/2007  ? ACTINIC KERATOSIS, FOREHEAD, LEFT 03/09/2007  ? Headache(784.0) 03/09/2007  ? [place] DISEASE 09/22/2006  ? RAYNAUD'S DISEASE 09/22/2006  ? Osteoporosis 09/22/2006  ? ? ?REFERRING DIAG: R42 (ICD-10-CM) - Vertigo  ? ?THERAPY DIAG:  ?No diagnosis found. ? ?PERTINENT HISTORY:  Osteopenia, borderline osteoporosis, hx LBP, Breast CA , Hx of knee surgery, varicose veins, COVID on 04/17/21, vertigo back in 12/2019, Meniere's Disease ? ?PRECAUTIONS: Fall ? ?SUBJECTIVE: Pt reports that her dizziness is feeling much better today. ? ?PAIN:  ?Are you having pain? Yes: NPRS scale: 6/10 ?Pain location: bilat knee ?Pain description: sore ?Aggravating factors: ambulation in the AM ?Relieving factors: rest ? ?PATIENT GOALS:  To be able to get back to walking and driving. ? ?OBJECTIVE: (objective measures completed at initial evaluation unless otherwise dated) ? ?DIAGNOSTIC FINDINGS: To go to ENT and Audiologist in May ?  ?COGNITION: ?Overall cognitive status: Within functional limits for tasks assessed ?            ?POSTURE: rounded shoulders and forward head ?  ?  ?Cervical ROM:   ?  ?Active A/PROM (deg) ?07/10/2021  ?Flexion 50  ?Extension 20  ?Right lateral flexion    ?Left lateral flexion    ?Right rotation    ?Left rotation    ?(Blank rows = not tested) ?  ?STRENGTH: L shoulder strength of 4/5, R shoulder strength of grossly 4+/5 ?  ?  ?BED MOBILITY:  ?WFL, but reports that she sometimes has dizziness when she lies flat ?  ?GAIT: ?Gait pattern: antalgic, poor foot clearance- Right, and poor foot clearance- Left ?Distance walked: 50 ?Assistive device utilized: Single point cane ?Level of assistance: SBA ?Comments: Pt with unsteady gait pattern ?  ?FUNCTIONAL TESTs:  ? ?07/21/2021: ?MCTSIB: Condition 1: Avg of 3 trials: 30 sec, Condition 2: Avg of 3 trials: 30 sec, Condition 3: Avg of 3 trials: 30 sec, Condition 4: Avg of 3 trials: 28 sec, and Total Score: 118/120 ? ?07/14/2021: ?5 times sit to stand: 16.8 sec with arms crossed ?Timed up and go (TUG): 11 sec without assistive device ? ?07/10/2021: ?MCTSIB: Condition 1: Avg of 3 trials: 30 sec, Condition 2: Avg of 3 trials: 0 sec, Condition 3: Avg of 3 trials: 0 sec, Condition 4: Avg of 3 trials: 0 sec, and Total Score: 30/120 ?  ?PATIENT SURVEYS:   ? ?07/27/2021:  Dizziness Handicapped Inventory ?DHI Total Score: 2 / 100 ?Physical Score: 0 / 28 ?Emotional Score: 0 / 36 ?Functional Score: 2 / 36 ? ?07/14/2021:  Dizziness Handicapped Inventory ?DHI Total Score: 56 / 100 ?Physical Score: 16 / 28 ?Emotional Score: 14 / 36 ?Functional Score: 26 / 36 ?  ?  ?VESTIBULAR ASSESSMENT ?  ?           GENERAL OBSERVATION: Pt with unsteadiness of gait noted with slow gait velocity ?            ?  SYMPTOM BEHAVIOR: ?                      Subjective history: Pt has had a history of dizziness and Meni?re's Disease before, but states that this dizziness is different. ?                      Non-Vestibular symptoms: changes in hearing and tinnitus ?                      Type of dizziness: Imbalance (Disequilibrium) ?                      Frequency: daily ?                      Duration: throughout the day ?                      Aggravating factors: Induced by position change: lying supine and supine to sit and Induced by motion: turning head quickly and driving ?                      Relieving factors: head stationary ?                      Progression of symptoms: unchanged ?  ?           OCULOMOTOR EXAM: ?                      Ocular Alignment: normal ?                      Ocular ROM:  some decreased occular ROM noted ?                      Spontaneous Nystagmus: absent ?                      Gaze-Induced Nystagmus: absent ?                      Smooth Pursuits: intact ?                      Saccades: intact ?                       ?  ?           VESTIBULAR - OCULAR REFLEX:  ?                      Slow VOR: Positive Bilaterally ?                                  ?           POSITIONAL TESTING:  ?07/10/2021: Left Dix-Hallpike: nystagmus noted; Duration: greater than 1 min ?07/14/2021:  on Left Dix Hallpike- 10 sec of nystagmus noted, proceeded with Epley.  On right [employer], no nystagmus noted, but pt reported dizziness ?07/23/2021:  on Left Dix Hallpike- 5-10 sec of  nystagmus noted, proceeded with Epley ?            ?OTHOSTATICS: not done ?  ?FUNCTIONAL GAIT:  ? ?  DYNAMIC GAIT INDEX ? 07/14/2021  ?LEVEL SURFACE 2  ?CHANGE IN GAIT SPEED 1  ?GAIT WITH HORIZONTAL HEAD TURNS 0  ?GAIT W

## 2021-07-29 DIAGNOSIS — H02055 Trichiasis without entropian left lower eyelid: Secondary | ICD-10-CM | POA: Diagnosis not present

## 2021-07-30 ENCOUNTER — Encounter: Payer: Self-pay | Admitting: Rehabilitative and Restorative Service Providers"

## 2021-08-03 ENCOUNTER — Ambulatory Visit: Payer: Medicare Other | Admitting: Rehabilitative and Restorative Service Providers"

## 2021-08-03 ENCOUNTER — Encounter: Payer: Self-pay | Admitting: Rehabilitative and Restorative Service Providers"

## 2021-08-03 DIAGNOSIS — M6281 Muscle weakness (generalized): Secondary | ICD-10-CM | POA: Diagnosis not present

## 2021-08-03 DIAGNOSIS — R42 Dizziness and giddiness: Secondary | ICD-10-CM

## 2021-08-03 DIAGNOSIS — R262 Difficulty in walking, not elsewhere classified: Secondary | ICD-10-CM

## 2021-08-03 DIAGNOSIS — R2689 Other abnormalities of gait and mobility: Secondary | ICD-10-CM

## 2021-08-03 NOTE — Therapy (Signed)
?OUTPATIENT PHYSICAL THERAPY TREATMENT NOTE ? ? ?Patient Name: Lori [name] ?MRN: [id number] ?DOB:06/01/1940, 81 y.o., female ?Today's Date: 08/03/2021 ? ?PCP: [name], MD ?REFERRING PROVIDER: [name], MD ? ?END OF SESSION:  ? PT End of Session - 08/03/21 1104   ? ? Visit Number 7   ? Date for PT Re-Evaluation 09/04/21   ? Authorization Type UHC Medicare   ? PT Start Time 1100   ? PT Stop Time 1140   ? PT Time Calculation (min) 40 min   ? Activity Tolerance Patient tolerated treatment well   ? Behavior During Therapy [facility] for tasks assessed/performed   ? ?  ?  ? ?  ? ? ?Past Medical History:  ?Diagnosis Date  ? Allergy   ? Anemia   ? past hx of anemia  ? Aneurysm ([place])   ? pseudo-aneurym of carotid arteries per pt  ? Anxiety   ? Aortic atherosclerosis ([place])   ? Arthritis   ? knee- DJD   ? Barrett's esophagus   ? Breast cancer ([place])   ? right breast IDC  ? Burning mouth syndrome   ? Dr Redmond Baseman [phone] - no smell or taste x 4 yrs per pt   ? Cataract   ? bilateral   ? Clotting disorder ([place]) 1988  ? 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   ? Eczema   ? Family history of breast cancer   ? Family history of kidney cancer   ? Family history of multiple myeloma   ? Family history of thyroid cancer   ? GERD (gastroesophageal reflux disease)   ? Headache(784.0)   ? History of IBS   ? History of kidney stones   ? 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 Hyperlipidemia   ? on medication  ? Hypertension   ? Low back pain   ? Meniere disease   ? Neuromuscular disorder ([place])   ? raynaud's  ? Osteopenia   ? 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HCC)   ? Stroke [facility]) 1988  ? 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 carotid artery dissection   ? Tubular adenoma of colon 02/2013  ? Varicose veins with inflammation   ? upper and lower per pt   ? Vasculitis ([place])   ? ?Past Surgical History:  ?Procedure Laterality Date  ? arthroscopic knee  2009  ? left knee/ torn meniscus  ? BREAST LUMPECTOMY WITH RADIOACTIVE SEED LOCALIZATION Right  11/05/2020  ? Procedure: RIGHT BREAST LUMPECTOMY WITH RADIOACTIVE SEED LOCALIZATION;  Surgeon: Erroll Luna, MD;  Location: [place];  Service: General;  Laterality: Right;  ? BUNIONECTOMY Right 1998  ? with other foot surgery   ? carotid artery disection  1988  ? Carotid Artery Dissection  ? CATARACT EXTRACTION, BILATERAL  07-18-2017,08-08-2017  ? [place]  ? 1 time  ? COLONOSCOPY  2019  ? last 2019  ? DILATION AND CURETTAGE OF UTERUS  2004  ? POLYPECTOMY    ? POPLITEAL SYNOVIAL CYST EXCISION    ? left leg  ? TONSILLECTOMY  1957  ? UPPER GASTROINTESTINAL ENDOSCOPY    ? last 2018  ? ?Patient Active Problem List  ? Diagnosis Date Noted  ? Abnormal ear sensation, right 02/27/2021  ? Genetic testing 11/10/2020  ? Family history of kidney cancer 10/22/2020  ? Family history of thyroid cancer 10/22/2020  ? Family history of multiple myeloma 10/22/2020  ? Family history of breast cancer 10/22/2020  ? Malignant  neoplasm of lower-inner quadrant of right breast of female, estrogen receptor positive ([place]) 10/17/2020  ? Parotid adenoma 06/26/2020  ? IBS (irritable bowel syndrome) 06/26/2019  ? Osteoarthritis of left knee 09/15/2017  ? 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 Aortic atherosclerosis ([place]) 02/11/2016  ? Hypertension 11/29/2013  ? Eczema 11/29/2013  ? Hyperlipidemia 11/29/2013  ? Barrett's esophagus 08/24/2013  ? GERD (gastroesophageal reflux disease) 05/16/2013  ? Vitamin D deficiency 01/29/2012  ? Solitary pulmonary nodule 06/08/2011  ? HIATAL HERNIA WITH REFLUX 02/06/2010  ? DEGENERATIVE JOINT DISEASE, KNEE 03/14/2008  ? VARICOSE VEINS LOWER EXTREMITIES W/INFLAMMATION 03/09/2007  ? Allergic rhinitis 03/09/2007  ? ACTINIC KERATOSIS, FOREHEAD, LEFT 03/09/2007  ? Headache(784.0) 03/09/2007  ? [place] DISEASE 09/22/2006  ? RAYNAUD'S DISEASE 09/22/2006  ? Osteoporosis 09/22/2006  ? ? ?REFERRING DIAG: R42 (ICD-10-CM) - Vertigo  ? ?THERAPY DIAG:  ?Vertigo ? ?Balance problem ? ?Dizziness and  giddiness ? ?Muscle weakness (generalized) ? ?Difficulty in walking, not elsewhere classified ? ?PERTINENT HISTORY: Osteopenia, borderline osteoporosis, hx LBP, Breast CA , Hx of knee surgery, varicose veins, COVID on 04/17/21, vertigo back in 12/2019, Meniere's Disease ? ?PRECAUTIONS: Fall ? ?SUBJECTIVE: Pt states that she went on a small walk with her dog with her cane, reports using the cane primarily now due to knee pain. ? ?PAIN:  ?Are you having pain? Yes: NPRS scale: 7/10 ?Pain location: bilat knee ?Pain description: sore ?Aggravating factors: ambulation in the AM ?Relieving factors: rest ? ?PATIENT GOALS:  To be able to get back to walking and driving. ? ?OBJECTIVE: (objective measures completed at initial evaluation unless otherwise dated) ? ?DIAGNOSTIC FINDINGS: To go to ENT and Audiologist in May ?  ?COGNITION: ?Overall cognitive status: Within functional limits for tasks assessed ?            ?POSTURE: rounded shoulders and forward head ?  ?  ?Cervical ROM:   ?  ?Active A/PROM (deg) ?07/10/2021  ?Flexion 50  ?Extension 20  ?Right lateral flexion    ?Left lateral flexion    ?Right rotation    ?Left rotation    ?(Blank rows = not tested) ?  ?STRENGTH: L shoulder strength of 4/5, R shoulder strength of grossly 4+/5 ?  ?  ?BED MOBILITY:  ?WFL, but reports that she sometimes has dizziness when she lies flat ?  ?GAIT: ?Gait pattern: antalgic, poor foot clearance- Right, and poor foot clearance- Left ?Distance walked: 50 ?Assistive device utilized: Single point cane ?Level of assistance: SBA ?Comments: Pt with unsteady gait pattern ?  ?FUNCTIONAL TESTs:  ? ?07/21/2021: ?MCTSIB: Condition 1: Avg of 3 trials: 30 sec, Condition 2: Avg of 3 trials: 30 sec, Condition 3: Avg of 3 trials: 30 sec, Condition 4: Avg of 3 trials: 28 sec, and Total Score: 118/120 ? ?07/14/2021: ?5 times sit to stand: 16.8 sec with arms crossed ?Timed up and go (TUG): 11 sec without assistive device ? ?07/10/2021: ?MCTSIB: Condition 1: Avg of 3  trials: 30 sec, Condition 2: Avg of 3 trials: 0 sec, Condition 3: Avg of 3 trials: 0 sec, Condition 4: Avg of 3 trials: 0 sec, and Total Score: 30/120 ?  ?PATIENT SURVEYS:  ? ?07/27/2021:  Dizziness Handicapped Inventory ?DHI Total Score: 2 / 100 ?Physical Score: 0 / 28 ?Emotional Score: 0 / 36 ?Functional Score: 2 / 36 ? ?07/14/2021:  Dizziness Handicapped Inventory ?DHI Total Score: 56 / 100 ?Physical Score: 16 / 28 ?Emotional Score: 14 / 36 ?Functional Score: 26 / 36 ?  ?  ?VESTIBULAR  ASSESSMENT ?  ?           GENERAL OBSERVATION: Pt with unsteadiness of gait noted with slow gait velocity ?            ?           SYMPTOM BEHAVIOR: ?                      Subjective history: Pt has had a history of dizziness and Meni?re's Disease before, but states that this dizziness is different. ?                      Non-Vestibular symptoms: changes in hearing and tinnitus ?                      Type of dizziness: Imbalance (Disequilibrium) ?                      Frequency: daily ?                      Duration: throughout the day ?                      Aggravating factors: Induced by position change: lying supine and supine to sit and Induced by motion: turning head quickly and driving ?                      Relieving factors: head stationary ?                      Progression of symptoms: unchanged ?  ?           OCULOMOTOR EXAM: ?                      Ocular Alignment: normal ?                      Ocular ROM:  some decreased occular ROM noted ?                      Spontaneous Nystagmus: absent ?                      Gaze-Induced Nystagmus: absent ?                      Smooth Pursuits: intact ?                      Saccades: intact ?                       ?  ?           VESTIBULAR - OCULAR REFLEX:  ?                      Slow VOR: Positive Bilaterally ?                                  ?           POSITIONAL TESTING:  ?07/10/2021: Left Dix-Hallpike: nystagmus noted; Duration: greater than 1 min ?07/14/2021:  on Left Dix  Hallpike- 10 sec of nystagmus noted,  proceeded with Epley.  On right [employer], no nystagmus noted, but pt reported dizziness ?07/23/2021:  on Left Dix Hallpike- 5-10 sec of nystagmus noted, proceeded with Epl

## 2021-08-05 ENCOUNTER — Encounter: Payer: Self-pay | Admitting: Rehabilitative and Restorative Service Providers"

## 2021-08-06 DIAGNOSIS — H903 Sensorineural hearing loss, bilateral: Secondary | ICD-10-CM | POA: Diagnosis not present

## 2021-08-07 ENCOUNTER — Other Ambulatory Visit: Payer: Self-pay | Admitting: Hematology and Oncology

## 2021-08-10 NOTE — Progress Notes (Signed)
Patient Care Team: Shelva Majestic, MD as PCP - General (Family Medicine) Christia Reading, MD as Consulting Physician (Otolaryngology) Meryl Dare, MD as Consulting Physician (Gastroenterology) Aris Lot, MD as Consulting Physician (Dermatology) Harriette Bouillon, MD as Consulting Physician (General Surgery) Serena Croissant, MD as Consulting Physician (Hematology and Oncology) Dorothy Puffer, MD as Consulting Physician (Radiation Oncology) Erroll Luna, [facility] as Pharmacist (Pharmacist)  DIAGNOSIS:  Encounter Diagnosis  Name Primary?   Malignant neoplasm of lower-inner quadrant of right breast of female, estrogen receptor positive (HCC)     SUMMARY OF ONCOLOGIC HISTORY: Oncology History  Malignant neoplasm of lower-inner quadrant of right breast of female, estrogen receptor positive (HCC)  10/13/2020 Initial Diagnosis   Screening mammogram showed indeterminate mass in the right breast. Diagnostic mammogram and US showed 1 cm x 1.3 cm suspicious irregular mass at 4:00 4 cm from the nipple. 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   10/22/2020 Cancer Staging   Staging form: Breast, AJCC 8th Edition - Clinical stage from 10/22/2020: Stage IA (cT1b, cN0, cM0, G2, ER+, PR+, HER2-) - Signed by Serena Croissant, MD on 10/22/2020 Stage prefix: Initial diagnosis Histologic grading system: 3 grade system    11/05/2020 Genetic Testing   Negative hereditary cancer genetic testing: no pathogenic variants detected in [facility] CancerNext-Expanded +RNAinsight Panel.  The report date is November 05, 2020.    The CancerNext-Expanded gene panel offered by [facility] and includes sequencing, rearrangement, and RNA analysis for the following 77 genes: AIP, ALK, APC, ATM, AXIN2, BAP1, BARD1, BLM, BMPR1A, BRCA1, BRCA2, BRIP1, CDC73, CDH1, CDK4, CDKN1B, CDKN2A, CHEK2, CTNNA1, DICER1, FANCC, FH, FLCN, GALNT12, KIF1B, LZTR1, MAX, MEN1, MET, MLH1, MSH2, MSH3, MSH6, MUTYH,  NBN, NF1, NF2, NTHL1, PALB2, PHOX2B, PMS2, POT1, PRKAR1A, PTCH1, PTEN, RAD51C, RAD51D, RB1, RECQL, RET, SDHA, SDHAF2, SDHB, SDHC, SDHD, SMAD4, SMARCA4, SMARCB1, SMARCE1, STK11, SUFU, TMEM127, TP53, TSC1, TSC2, VHL and XRCC2 (sequencing and deletion/duplication); EGFR, EGLN1, HOXB13, KIT, MITF, PDGFRA, POLD1, and POLE (sequencing only); EPCAM and GREM1 (deletion/duplication only).    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, margins negative, ER 95%, PR 80%, HER2 negative, Ki-67 10%   11/05/2020 Cancer Staging   Staging form: Breast, AJCC 8th Edition - Pathologic stage from 11/05/2020: Stage IA (pT1b, pN0, cM0, G2, ER+, PR+, HER2-) - Signed by Loa Socks, NP on 02/18/2021 Stage prefix: Initial diagnosis Histologic grading system: 3 grade system    11/2020 -  Anti-estrogen oral therapy   Letrozole daily     CHIEF COMPLIANT: Follow-up of right breast cancer  INTERVAL HISTORY: Lori [name] is a 81 y.o. with above-mentioned history of right breast cancer. She presents to the clinic today for a follow-up. She states that the hot flashes are mild. She complains of vertigo after Covid in January. States that she took physical therapy for the unsteady gait. Complains of stiffness. States it hurts all the time. States that she real sensitive to a lot of stuff.   ALLERGIES:  is allergic to hydrocodone-acetaminophen, azithromycin, klonopin [clonazepam], neomycin, nitrofurantoin, penicillins, pneumococcal vaccine polyvalent, and pneumovax [pneumococcal polysaccharide vaccine].  MEDICATIONS:  Current Outpatient Medications  Medication Sig Dispense Refill   amLODipine (NORVASC) 2.5 MG tablet TAKE 1 TABLET BY MOUTH EVERY DAY 90 tablet 2   aspirin 81 MG tablet Take 81 mg by mouth daily. Evening     cyanocobalamin 1000 MCG tablet Take 1,000 mcg by mouth once a week.     D 1000 25  MCG (1000 UT) capsule SMARTSIG:1 By Mouth     letrozole (FEMARA) 2.5 MG tablet  TAKE 1 TABLET BY MOUTH EVERY DAY 90 tablet 3   loratadine (CLARITIN) 10 MG tablet Take 10 mg by mouth daily. Take 1/2 tablet daily     metoprolol tartrate (LOPRESSOR) 50 MG tablet TAKE 1/2 TABLET BY MOUTH 2 TIMES DAILY 90 tablet 1   montelukast (SINGULAIR) 10 MG tablet TAKE 1 TABLET BY MOUTH AT BEDTIME 90 tablet 1   ondansetron (ZOFRAN-ODT) 4 MG disintegrating tablet Take 1 tablet (4 mg total) by mouth every 8 (eight) hours as needed for nausea or vomiting. 15 tablet 0   pantoprazole (PROTONIX) 40 MG tablet TAKE 1 TABLET BY MOUTH EVERY DAY 90 tablet 1   rosuvastatin (CRESTOR) 10 MG tablet Take 1 tablet (10 mg total) by mouth once a week. 13 tablet 3   Vitamin D, Cholecalciferol, 1000 units TABS Take by mouth daily. Morning     No current facility-administered medications for this visit.    PHYSICAL EXAMINATION: ECOG PERFORMANCE STATUS: 1 - Symptomatic but completely ambulatory  Vitals:   08/19/21 1110  BP: 134/65  Pulse: 70  Resp: 18  Temp: 97.8 F (36.6 C)  SpO2: 97%   Filed Weights   08/19/21 1110  Weight: 116 lb 8 oz (52.8 kg)    BREAST: No palpable masses or nodules in either right or left breasts. No palpable axillary supraclavicular or infraclavicular adenopathy no breast tenderness or nipple discharge. (exam performed in the presence of a chaperone)  LABORATORY DATA:  I have reviewed the data as listed    Latest Ref Rng & Units 05/20/2021   11:40 AM 01/02/2021   11:31 AM 10/22/2020    8:16 AM  CMP  Glucose 70 - 99 mg/dL 63   77   133    BUN 6 - 23 mg/dL $Remove'24   22   27    'jrMCyjW$ Creatinine 0.40 - 1.20 mg/dL 0.69   0.60   0.75    Sodium 135 - 145 mEq/L 139   137   142    Potassium 3.5 - 5.1 mEq/L 4.6   3.8   3.8    Chloride 96 - 112 mEq/L 103   102   109    CO2 19 - 32 mEq/L $Remove'27   26   24    'GHhYbxA$ Calcium 8.4 - 10.5 mg/dL 10.0   9.9   9.9    Total Protein 6.0 - 8.3 g/dL 8.0   7.7   7.5    Total Bilirubin 0.2 - 1.2 mg/dL 0.4   0.4   0.4    Alkaline Phos 39 - 117 U/L 46   46   43     AST 0 - 37 U/L $Remo'24   22   20    'vfElb$ ALT 0 - 35 U/L $Remo'17   15   15      'HkaMD$ Lab Results  Component Value Date   WBC 5.7 05/20/2021   HGB 11.9 (L) 05/20/2021   HCT 36.6 05/20/2021   MCV 85.5 05/20/2021   PLT 295.0 05/20/2021   NEUTROABS 2.3 05/20/2021    ASSESSMENT & PLAN:  Malignant neoplasm of lower-inner quadrant of right breast of female, estrogen receptor positive ([place]) 10/13/2020:Screening mammogram showed indeterminate mass in the right breast. Diagnostic mammogram and US showed 1 cm x 1.3 cm suspicious irregular mass at 4:00 4 cm from the nipple. 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  Current treatment: Letrozole Breast cancer surveillance: 1.  Breast exam 08/19/2021: Benign 2. mammogram needs to be performed  Return to clinic in 1 year for follow-up    No orders of the defined types were placed in this encounter.  The patient has a good understanding of the overall plan. she agrees with it. she will call with any problems that may develop before the next visit here. Total time spent: 30 mins including face to face time and time spent for planning, charting and co-ordination of care   Harriette Ohara, MD 08/19/21    I Gardiner Coins am scribing for Dr. Lindi Adie  I have reviewed the above documentation for accuracy and completeness, and I agree with the above.

## 2021-08-11 ENCOUNTER — Encounter: Payer: Self-pay | Admitting: Rehabilitative and Restorative Service Providers"

## 2021-08-11 ENCOUNTER — Ambulatory Visit: Payer: Medicare Other | Admitting: Rehabilitative and Restorative Service Providers"

## 2021-08-11 DIAGNOSIS — M6281 Muscle weakness (generalized): Secondary | ICD-10-CM | POA: Diagnosis not present

## 2021-08-11 DIAGNOSIS — R2689 Other abnormalities of gait and mobility: Secondary | ICD-10-CM | POA: Diagnosis not present

## 2021-08-11 DIAGNOSIS — R262 Difficulty in walking, not elsewhere classified: Secondary | ICD-10-CM | POA: Diagnosis not present

## 2021-08-11 DIAGNOSIS — R42 Dizziness and giddiness: Secondary | ICD-10-CM | POA: Diagnosis not present

## 2021-08-11 NOTE — Therapy (Signed)
OUTPATIENT PHYSICAL THERAPY TREATMENT NOTE AND DISCHARGE SUMMARY   Patient Name: Lori [name] MRN: [id number] DOB:February 17, 1941, 81 y.o., female Today's Date: 08/11/2021  PCP: [name], MD REFERRING PROVIDER: [name], MD  END OF SESSION:   PT End of Session - 08/11/21 1102     Visit Number 8    Date for PT Re-Evaluation 09/04/21    Authorization Type [employer] Medicare    PT Start Time 1055    PT Stop Time 1135    PT Time Calculation (min) 40 min    Activity Tolerance Patient tolerated treatment well    Behavior During Therapy WFL for tasks assessed/performed             Past Medical History:  Diagnosis Date   Allergy    Anemia    past hx of anemia   Aneurysm ([place])    pseudo-aneurym of carotid arteries per pt   Anxiety    Aortic atherosclerosis (HCC)    Arthritis    knee- DJD    Barrett's esophagus    Breast cancer ([place])    right breast IDC   Burning mouth syndrome    Dr Redmond Baseman 11-2016 - no smell or taste x 4 yrs per pt    Cataract    bilateral    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    Eczema    Family history of breast cancer    Family history of kidney cancer    Family history of multiple myeloma    Family history of thyroid cancer    GERD (gastroesophageal reflux disease)    Headache(784.0)    History of IBS    History of kidney stones    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    Hyperlipidemia    on medication   Hypertension    Low back pain    Meniere disease    Neuromuscular disorder ([place])    raynaud's   Osteopenia    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[place])    Stroke ([place]) 1988   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 carotid artery dissection    Tubular adenoma of colon 02/2013   Varicose veins with inflammation    upper and lower per pt    Vasculitis [facility])    Past Surgical History:  Procedure Laterality Date   arthroscopic knee  2009   left knee/ torn meniscus   BREAST LUMPECTOMY WITH RADIOACTIVE  SEED LOCALIZATION Right 11/05/2020   Procedure: RIGHT BREAST LUMPECTOMY WITH RADIOACTIVE SEED LOCALIZATION;  Surgeon: Erroll Luna, MD;  Location: [place];  Service: General;  Laterality: Right;   BUNIONECTOMY Right 1998   with other foot surgery    carotid artery disection  1988   Carotid Artery Dissection   CATARACT EXTRACTION, BILATERAL  07-18-2017,08-08-2017   CESAREAN SECTION  1988   1 time   COLONOSCOPY  2019   last 2019   [place] OF UTERUS  2004   POLYPECTOMY     POPLITEAL SYNOVIAL CYST EXCISION     left leg   TONSILLECTOMY  1957   UPPER GASTROINTESTINAL ENDOSCOPY     last 2018   Patient Active Problem List   Diagnosis Date Noted   Abnormal ear sensation, right 02/27/2021   Genetic testing 11/10/2020   Family history of kidney cancer 10/22/2020   Family history of thyroid cancer 10/22/2020   Family history of multiple myeloma 10/22/2020   Family history of breast cancer 10/22/2020  Malignant neoplasm of lower-inner quadrant of right breast of female, estrogen receptor positive ([place]) 10/17/2020   Parotid adenoma 06/26/2020   IBS (irritable bowel syndrome) 06/26/2019   Osteoarthritis of left knee 09/15/2017   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   Aortic atherosclerosis ([place]) 02/11/2016   Hypertension 11/29/2013   Eczema 11/29/2013   Hyperlipidemia 11/29/2013   Barrett's esophagus 08/24/2013   GERD (gastroesophageal reflux disease) 05/16/2013   Vitamin D deficiency 01/29/2012   Solitary pulmonary nodule 06/08/2011   HIATAL HERNIA WITH REFLUX 02/06/2010   DEGENERATIVE JOINT DISEASE, KNEE 03/14/2008   VARICOSE VEINS LOWER EXTREMITIES W/INFLAMMATION 03/09/2007   Allergic rhinitis 03/09/2007   ACTINIC KERATOSIS, FOREHEAD, LEFT 03/09/2007   Headache(784.0) 03/09/2007   MENIERE'S DISEASE 09/22/2006   RAYNAUD'S DISEASE 09/22/2006   Osteoporosis 09/22/2006    REFERRING DIAG: R42 (ICD-10-CM) - Vertigo   THERAPY DIAG:  Vertigo  Balance  problem  Dizziness and giddiness  Muscle weakness (generalized)  Difficulty in walking, not elsewhere classified  PERTINENT HISTORY: Osteopenia, borderline osteoporosis, hx LBP, Breast CA , Hx of knee surgery, varicose veins, COVID on 04/17/21, vertigo back in 12/2019, Meniere's Disease  PRECAUTIONS: Fall  SUBJECTIVE: Pt reports that she was able to walk with her friends and drive over the weekend without incident and did not increase dizziness.  PAIN:  Are you having pain? Yes: NPRS scale: 7/10 Pain location: bilat knee Pain description: sore Aggravating factors: ambulation in the AM Relieving factors: rest  PATIENT GOALS:  To be able to get back to walking and driving.  OBJECTIVE: (objective measures completed at initial evaluation unless otherwise dated)  DIAGNOSTIC FINDINGS: To go to ENT and Audiologist in May   COGNITION: Overall cognitive status: Within functional limits for tasks assessed             POSTURE: rounded shoulders and forward head     Cervical ROM:     Active A/ROM (deg) 07/10/2021 A/ROM (deg) 08/11/2021  Flexion 50 65  Extension 20 25  Right lateral flexion     Left lateral flexion     Right rotation     Left rotation     (Blank rows = not tested)   STRENGTH: L shoulder strength of 4/5, R shoulder strength of grossly 4+/5     BED MOBILITY:  WFL, but reports that she sometimes has dizziness when she lies flat   GAIT: Gait pattern: antalgic, poor foot clearance- Right, and poor foot clearance- Left Distance walked: 50 Assistive device utilized: Single point cane Level of assistance: SBA Comments: Pt with unsteady gait pattern   FUNCTIONAL TESTs:   07/21/2021: MCTSIB: Condition 1: Avg of 3 trials: 30 sec, Condition 2: Avg of 3 trials: 30 sec, Condition 3: Avg of 3 trials: 30 sec, Condition 4: Avg of 3 trials: 28 sec, and Total Score: 118/120  07/14/2021: 5 times sit to stand: 16.8 sec with arms crossed Timed up and go (TUG): 11 sec without  assistive device  07/10/2021: MCTSIB: Condition 1: Avg of 3 trials: 30 sec, Condition 2: Avg of 3 trials: 0 sec, Condition 3: Avg of 3 trials: 0 sec, Condition 4: Avg of 3 trials: 0 sec, and Total Score: 30/120   PATIENT SURVEYS:   07/27/2021:  Dizziness Handicapped Inventory DHI Total Score: 2 / 100 Physical Score: 0 / 28 Emotional Score: 0 / 36 Functional Score: 2 / 36  07/14/2021:  Dizziness Handicapped Inventory DHI Total Score: 56 / 100 Physical Score: 16 / 28 Emotional Score: 14 / 36 Functional Score:  26 / 8     VESTIBULAR ASSESSMENT              GENERAL OBSERVATION: Pt with unsteadiness of gait noted with slow gait velocity                        SYMPTOM BEHAVIOR:                       Subjective history: Pt has had a history of dizziness and Menire's Disease before, but states that this dizziness is different.                       Non-Vestibular symptoms: changes in hearing and tinnitus                       Type of dizziness: Imbalance (Disequilibrium)                       Frequency: daily                       Duration: throughout the day                       Aggravating factors: Induced by position change: lying supine and supine to sit and Induced by motion: turning head quickly and driving                       Relieving factors: head stationary                       Progression of symptoms: unchanged              OCULOMOTOR EXAM:                       Ocular Alignment: normal                       Ocular ROM:  some decreased occular ROM noted                       Spontaneous Nystagmus: absent                       Gaze-Induced Nystagmus: absent                       Smooth Pursuits: intact                       Saccades: intact                                     VESTIBULAR - OCULAR REFLEX:                        Slow VOR: Positive Bilaterally                                              POSITIONAL TESTING:  07/10/2021: Left Dix-Hallpike: nystagmus noted;  Duration: greater than 1 min 07/14/2021:  on  Left Dix Hallpike- 10 sec of nystagmus noted, proceeded with Epley.  On right [employer], no nystagmus noted, but pt reported dizziness 07/23/2021:  on Left Dix Hallpike- 5-10 sec of nystagmus noted, proceeded with Epley             OTHOSTATICS: not done   FUNCTIONAL GAIT:   DYNAMIC GAIT INDEX  07/14/2021 08/11/2021  LEVEL SURFACE 2 3  CHANGE IN GAIT SPEED 1 3  GAIT WITH HORIZONTAL HEAD TURNS 0 3  GAIT WITH VERTICAL HEAD TURNS 0 3  GAIT AND PIVOT TURN 1 2  STEP OVER OBSTACLE 0 2  STEP AROUND OBSTACLES 1 3  STEPS 1 2  TOTAL 6/24 21/24       VESTIBULAR TREATMENT:   08/11/2021: Nustep level 5 x6 min with PT present to discuss status DGI 21/24 Pencil Pushups 2x10 Visual tracking/occulomotor with ABC's x1 rep Alt LE toe taps to 2 cones 2x10 bilat Standing on blue foam:  performing fast head turns horizontal and vertical x1 min VOR 1x1 in standing on blue foam for both horizontal and vertical x1 min each  08/03/2021: Nustep level 5 x8 min with PT present to discuss status VOR 1x1 in standing on blue foam for both horizontal and vertical x1 min each Standing on blue foam:  performing fast head turns horizontal and vertical x1 min Alt LE toe taps to 2 cones 2x10 bilat Ambulation outside around sidewalks around clinic without assistive device and no loss of balance Pencil Pushups 2x10 Visual tracking/occulomotor with ABC's x1 rep  07/27/2021: Nustep level 5 x6 min with PT present to discuss status VOR 1x1 in standing on blue foam for both horizontal and vertical x1 min each Standing on blue foam:  performing fast head turns horizontal and vertical x1 min Dizziness Handicapped Inventory:  2/100 Visual tracking/occulomotor with ABC's x1 rep Pencil Pushups 2x10 Alt LE toe taps to 2 cones 2x10 bilat    PATIENT EDUCATION: Education details: Issued HEP Person educated: Patient Education method: Explanation, Demonstration, and  Handouts Education comprehension: verbalized understanding and returned demonstration   HOME EXERCISE PROGRAM: Access Code: [facility] URL: [email or web address] Date: 07/10/2021 Prepared by: Juel Burrow   Exercises - Brandt-Daroff Vestibular Exercise  - 1 x daily - 7 x weekly - 1 sets - 5 reps - Seated Gaze Stabilization with Head Rotation  - 1 x daily - 7 x weekly - 2 sets - 30 reps - Seated Gaze Stabilization with Head Nod  - 1 x daily - 7 x weekly - 3 sets - 10 reps     GOALS: Goals reviewed with patient? Yes   SHORT TERM GOALS: Target date: 07/31/2021   Pt will be independent with initial HEP. Baseline: Goal status: Goal Met   2.  Pt to complete Dynamic Gait Index and Modified CTSIB Baseline:  Goal status: Goal Met     LONG TERM GOALS: Target date: 09/04/2021   Pt will be independent with advanced HEP. Baseline:  Goal status: Goal Met 08/11/2021   2.  Pt will report at least a 75% improvement in dizziness symptoms since starting PT. Baseline:  Goal status: Goal Met 07/21/2021   3.  Pt will be able to go walking with her friends for at least 30 minutes without reports of increased dizziness or loss of balance. Baseline:  Goal status: Goal Met 08/11/2021   4.  Pt will increase modified CTSIB to at least 90/120 to decrease her risk of falling Baseline: 30/120 Goal status: Goal Met 07/21/2021  5.  Pt will report being able to return to driving without increased dizziness. Baseline: unable to drive Goal status: Goal Met 08/11/2021     ASSESSMENT:   CLINICAL IMPRESSION: Ms Duchesne presents to PT agreeable to participate in services.  Pt has been able to return to walking her dog with her friends and driving without increased dizziness.  Pt has met all goals for PT and denies dizziness throughout session. Pt is ready for discharge from skilled PT at this time to continue with HEP to maintain vestibular gains.     OBJECTIVE IMPAIRMENTS Abnormal gait,  decreased balance, difficulty walking, decreased strength, dizziness, postural dysfunction, and pain.    ACTIVITY LIMITATIONS community activity and driving.    PERSONAL FACTORS Age and 3+ comorbidities: recent Covid-19, Menire's Disease, osteopenia  are also affecting patient's functional outcome.      REHAB POTENTIAL: Good   CLINICAL DECISION MAKING: Evolving/moderate complexity   EVALUATION COMPLEXITY: Moderate     PLAN: PT FREQUENCY: 2x/week   PT DURATION: 8 weeks   PLANNED INTERVENTIONS: Therapeutic exercises, Therapeutic activity, Neuromuscular re-education, Balance training, Gait training, Patient/Family education, Joint mobilization, Stair training, Vestibular training, Canalith repositioning, Aquatic Therapy, Dry Needling, Cryotherapy, Moist heat, Taping, and Manual therapy   PLAN FOR NEXT SESSION: Discharged on 08/11/2021  PHYSICAL THERAPY DISCHARGE SUMMARY   Patient agrees to discharge. Patient goals were met. Patient is being discharged due to meeting the stated rehab goals.    Juel Burrow, PT 08/11/2021, 11:59 AM  [facility] [address] [place], [place] [address] Phone # [phone] Fax [phone]

## 2021-08-14 ENCOUNTER — Ambulatory Visit: Payer: Medicare Other | Admitting: Rehabilitative and Restorative Service Providers"

## 2021-08-18 ENCOUNTER — Encounter: Payer: Self-pay | Admitting: Rehabilitative and Restorative Service Providers"

## 2021-08-19 ENCOUNTER — Other Ambulatory Visit: Payer: Self-pay

## 2021-08-19 ENCOUNTER — Inpatient Hospital Stay: Payer: Medicare Other | Attending: Hematology and Oncology | Admitting: Hematology and Oncology

## 2021-08-19 DIAGNOSIS — R232 Flushing: Secondary | ICD-10-CM | POA: Insufficient documentation

## 2021-08-19 DIAGNOSIS — Z79899 Other long term (current) drug therapy: Secondary | ICD-10-CM | POA: Insufficient documentation

## 2021-08-19 DIAGNOSIS — C50311 Malignant neoplasm of lower-inner quadrant of right female breast: Secondary | ICD-10-CM | POA: Diagnosis not present

## 2021-08-19 DIAGNOSIS — M8580 Other specified disorders of bone density and structure, unspecified site: Secondary | ICD-10-CM | POA: Diagnosis not present

## 2021-08-19 DIAGNOSIS — Z170 Estrogen receptor positive status [ER+]: Secondary | ICD-10-CM | POA: Insufficient documentation

## 2021-08-19 DIAGNOSIS — Z79811 Long term (current) use of aromatase inhibitors: Secondary | ICD-10-CM | POA: Diagnosis not present

## 2021-08-19 DIAGNOSIS — Z7982 Long term (current) use of aspirin: Secondary | ICD-10-CM | POA: Diagnosis not present

## 2021-08-19 NOTE — Assessment & Plan Note (Addendum)
10/13/2020:Screening mammogram showed indeterminate mass in the right breast. Diagnostic mammogram and US showed 1 cm x 1.3 cm suspicious irregular mass at 4:00 4 cm from the nipple. 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  Current treatment: Letrozole Breast cancer surveillance: 1.  Breast exam 08/19/2021: Benign 2. mammogram needs to be performed  Return to clinic in 1 year for follow-up

## 2021-08-20 ENCOUNTER — Telehealth: Payer: Self-pay | Admitting: Hematology and Oncology

## 2021-08-20 ENCOUNTER — Encounter: Payer: Self-pay | Admitting: Rehabilitative and Restorative Service Providers"

## 2021-08-20 NOTE — Telephone Encounter (Signed)
Scheduled appointment per 5/31 los. Patient is aware.

## 2021-08-24 ENCOUNTER — Encounter: Payer: Self-pay | Admitting: Rehabilitative and Restorative Service Providers"

## 2021-08-27 ENCOUNTER — Encounter: Payer: Self-pay | Admitting: Rehabilitative and Restorative Service Providers"

## 2021-08-28 ENCOUNTER — Encounter: Payer: Medicare Other | Admitting: Rehabilitative and Restorative Service Providers"

## 2021-08-31 ENCOUNTER — Telehealth: Payer: Self-pay | Admitting: Adult Health

## 2021-08-31 ENCOUNTER — Encounter: Payer: Self-pay | Admitting: *Deleted

## 2021-08-31 NOTE — Progress Notes (Signed)
RN successfully faxed Dental Clearance form to Dr. Benna Dunks DDS [phone]).

## 2021-08-31 NOTE — Telephone Encounter (Signed)
Called [employer] to attempt peer to peer for patient's Prolia.  Reference number a [id number].   I was informed by the woman answering the phone that the case had been denied as of today.  The only option moving forward is an appeal which can be sent to [phone] as fax, the phone number for the pills is [phone].  Time spent: 10 minutes  Wilber Bihari, NP

## 2021-09-01 ENCOUNTER — Encounter: Payer: Self-pay | Admitting: *Deleted

## 2021-09-01 NOTE — Progress Notes (Signed)
Received Dental Clearance from Dr. Benna Dunks DDS approving pt for prolia/zometa injections.  Dental clearance placed in HIM folder to be scanned into epic.

## 2021-09-07 ENCOUNTER — Other Ambulatory Visit: Payer: Self-pay | Admitting: *Deleted

## 2021-09-07 DIAGNOSIS — C50311 Malignant neoplasm of lower-inner quadrant of right female breast: Secondary | ICD-10-CM

## 2021-09-08 ENCOUNTER — Inpatient Hospital Stay: Payer: Medicare Other

## 2021-09-08 ENCOUNTER — Other Ambulatory Visit: Payer: Self-pay

## 2021-09-08 ENCOUNTER — Inpatient Hospital Stay: Payer: Medicare Other | Attending: Hematology and Oncology

## 2021-09-08 VITALS — BP 150/72 | HR 71 | Temp 97.6°F | Resp 18

## 2021-09-08 DIAGNOSIS — Z170 Estrogen receptor positive status [ER+]: Secondary | ICD-10-CM | POA: Diagnosis not present

## 2021-09-08 DIAGNOSIS — M8580 Other specified disorders of bone density and structure, unspecified site: Secondary | ICD-10-CM | POA: Insufficient documentation

## 2021-09-08 DIAGNOSIS — C50311 Malignant neoplasm of lower-inner quadrant of right female breast: Secondary | ICD-10-CM | POA: Insufficient documentation

## 2021-09-08 LAB — CBC WITH DIFFERENTIAL (CANCER CENTER ONLY)
Abs Immature Granulocytes: 0.01 10*3/uL (ref 0.00–0.07)
Basophils Absolute: 0 10*3/uL (ref 0.0–0.1)
Basophils Relative: 1 %
Eosinophils Absolute: 0.3 10*3/uL (ref 0.0–0.5)
Eosinophils Relative: 5 %
HCT: 38.5 % (ref 36.0–46.0)
Hemoglobin: 12.5 g/dL (ref 12.0–15.0)
Immature Granulocytes: 0 %
Lymphocytes Relative: 30 %
Lymphs Abs: 1.7 10*3/uL (ref 0.7–4.0)
MCH: 28.7 pg (ref 26.0–34.0)
MCHC: 32.5 g/dL (ref 30.0–36.0)
MCV: 88.3 fL (ref 80.0–100.0)
Monocytes Absolute: 0.6 10*3/uL (ref 0.1–1.0)
Monocytes Relative: 11 %
Neutro Abs: 3.1 10*3/uL (ref 1.7–7.7)
Neutrophils Relative %: 53 %
Platelet Count: 263 10*3/uL (ref 150–400)
RBC: 4.36 MIL/uL (ref 3.87–5.11)
RDW: 12.9 % (ref 11.5–15.5)
WBC Count: 5.7 10*3/uL (ref 4.0–10.5)
nRBC: 0 % (ref 0.0–0.2)

## 2021-09-08 LAB — CMP (CANCER CENTER ONLY)
ALT: 12 U/L (ref 0–44)
AST: 17 U/L (ref 15–41)
Albumin: 4.1 g/dL (ref 3.5–5.0)
Alkaline Phosphatase: 80 U/L (ref 38–126)
Anion gap: 7 (ref 5–15)
BUN: 25 mg/dL — ABNORMAL HIGH (ref 8–23)
CO2: 27 mmol/L (ref 22–32)
Calcium: 10 mg/dL (ref 8.9–10.3)
Chloride: 105 mmol/L (ref 98–111)
Creatinine: 0.65 mg/dL (ref 0.44–1.00)
GFR, Estimated: >60 mL/min (ref >60)
Glucose, Bld: 111 mg/dL — ABNORMAL HIGH (ref 70–99)
Potassium: 3.9 mmol/L (ref 3.5–5.1)
Sodium: 139 mmol/L (ref 135–145)
Total Bilirubin: 0.3 mg/dL (ref 0.3–1.2)
Total Protein: 7.8 g/dL (ref 6.5–8.1)

## 2021-09-08 MED ORDER — DENOSUMAB 60 MG/ML ~~LOC~~ SOSY
60.0000 mg | PREFILLED_SYRINGE | Freq: Once | SUBCUTANEOUS | Status: AC
  Administered 2021-09-08: 60 mg via SUBCUTANEOUS
  Filled 2021-09-08: qty 1

## 2021-09-08 MED ORDER — SODIUM CHLORIDE 0.9 % IV SOLN: Freq: Once | INTRAVENOUS | Status: DC

## 2021-09-08 NOTE — Patient Instructions (Signed)
Denosumab injection What is this medication? DENOSUMAB (den oh sue mab) slows bone breakdown. Prolia is used to treat osteoporosis in women after menopause and in men, and in people who are taking corticosteroids for 6 months or more. Xgeva is used to treat a high calcium level due to cancer and to prevent bone fractures and other bone problems caused by multiple myeloma or cancer bone metastases. Xgeva is also used to treat giant cell tumor of the bone. This medicine may be used for other purposes; ask your health care provider or pharmacist if you have questions. COMMON BRAND NAME(S): Prolia, XGEVA What should I tell my care team before I take this medication? They need to know if you have any of these conditions: dental disease having surgery or tooth extraction infection kidney disease low levels of calcium or Vitamin D in the blood malnutrition on hemodialysis skin conditions or sensitivity thyroid or parathyroid disease an unusual reaction to denosumab, other medicines, foods, dyes, or preservatives pregnant or trying to get pregnant breast-feeding How should I use this medication? This medicine is for injection under the skin. It is given by a health care professional in a hospital or clinic setting. A special MedGuide will be given to you before each treatment. Be sure to read this information carefully each time. For Prolia, talk to your pediatrician regarding the use of this medicine in children. Special care may be needed. For Xgeva, talk to your pediatrician regarding the use of this medicine in children. While this drug may be prescribed for children as young as 13 years for selected conditions, precautions do apply. Overdosage: If you think you have taken too much of this medicine contact a poison control center or emergency room at once. NOTE: This medicine is only for you. Do not share this medicine with others. What if I miss a dose? It is important not to miss your dose.  Call your doctor or health care professional if you are unable to keep an appointment. What may interact with this medication? Do not take this medicine with any of the following medications: other medicines containing denosumab This medicine may also interact with the following medications: medicines that lower your chance of fighting infection steroid medicines like prednisone or cortisone This list may not describe all possible interactions. Give your health care provider a list of all the medicines, herbs, non-prescription drugs, or dietary supplements you use. Also tell them if you smoke, drink alcohol, or use illegal drugs. Some items may interact with your medicine. What should I watch for while using this medication? Visit your doctor or health care professional for regular checks on your progress. Your doctor or health care professional may order blood tests and other tests to see how you are doing. Call your doctor or health care professional for advice if you get a fever, chills or sore throat, or other symptoms of a cold or flu. Do not treat yourself. This drug may decrease your body's ability to fight infection. Try to avoid being around people who are sick. You should make sure you get enough calcium and vitamin D while you are taking this medicine, unless your doctor tells you not to. Discuss the foods you eat and the vitamins you take with your health care professional. See your dentist regularly. Brush and floss your teeth as directed. Before you have any dental work done, tell your dentist you are receiving this medicine. Do not become pregnant while taking this medicine or for 5 months after   stopping it. Talk with your doctor or health care professional about your birth control options while taking this medicine. Women should inform their doctor if they wish to become pregnant or think they might be pregnant. There is a potential for serious side effects to an unborn child. Talk to  your health care professional or pharmacist for more information. What side effects may I notice from receiving this medication? Side effects that you should report to your doctor or health care professional as soon as possible: allergic reactions like skin rash, itching or hives, swelling of the face, lips, or tongue bone pain breathing problems dizziness jaw pain, especially after dental work redness, blistering, peeling of the skin signs and symptoms of infection like fever or chills; cough; sore throat; pain or trouble passing urine signs of low calcium like fast heartbeat, muscle cramps or muscle pain; pain, tingling, numbness in the hands or feet; seizures unusual bleeding or bruising unusually weak or tired Side effects that usually do not require medical attention (report to your doctor or health care professional if they continue or are bothersome): constipation diarrhea headache joint pain loss of appetite muscle pain runny nose tiredness upset stomach This list may not describe all possible side effects. Call your doctor for medical advice about side effects. You may report side effects to FDA at 1-800-FDA-1088. Where should I keep my medication? This medicine is only given in a clinic, doctor's office, or other health care setting and will not be stored at home. NOTE: This sheet is a summary. It may not cover all possible information. If you have questions about this medicine, talk to your doctor, pharmacist, or health care provider.  2023 Elsevier/Gold Standard (2017-07-15 00:00:00)  

## 2021-09-09 ENCOUNTER — Encounter: Payer: Self-pay | Admitting: Rehabilitative and Restorative Service Providers"

## 2021-09-14 ENCOUNTER — Encounter: Payer: Self-pay | Admitting: Rehabilitative and Restorative Service Providers"

## 2021-09-14 DIAGNOSIS — H903 Sensorineural hearing loss, bilateral: Secondary | ICD-10-CM | POA: Insufficient documentation

## 2021-09-14 DIAGNOSIS — R2689 Other abnormalities of gait and mobility: Secondary | ICD-10-CM | POA: Diagnosis not present

## 2021-09-15 ENCOUNTER — Ambulatory Visit: Payer: Medicare Other | Admitting: Rehabilitative and Restorative Service Providers"

## 2021-09-17 ENCOUNTER — Encounter: Payer: Self-pay | Admitting: Rehabilitative and Restorative Service Providers"

## 2021-09-23 ENCOUNTER — Encounter: Payer: Self-pay | Admitting: *Deleted

## 2021-09-23 NOTE — Progress Notes (Signed)
Received faxed diagnostic mammogram and possible breast US orders from [facility].  MD signed orders and RN successfully faxed order to [facility] [phone]).

## 2021-09-25 DIAGNOSIS — H02055 Trichiasis without entropian left lower eyelid: Secondary | ICD-10-CM | POA: Diagnosis not present

## 2021-09-25 HISTORY — PX: EYE SURGERY: SHX253

## 2021-10-08 ENCOUNTER — Ambulatory Visit (INDEPENDENT_AMBULATORY_CARE_PROVIDER_SITE_OTHER): Payer: Medicare Other

## 2021-10-08 VITALS — BP 124/68 | HR 72 | Temp 97.8°F | Wt 117.4 lb

## 2021-10-08 DIAGNOSIS — Z0000 Encounter for general adult medical examination without abnormal findings: Secondary | ICD-10-CM | POA: Diagnosis not present

## 2021-10-08 NOTE — Patient Instructions (Signed)
Lori [name] , Thank you for taking time to come for your Medicare Wellness Visit. I appreciate your ongoing commitment to your health goals. Please review the following plan we discussed and let me know if I can assist you in the future.   Screening recommendations/referrals: Colonoscopy: no longer required  Mammogram: scheduled 11/10/21  Bone Density: done 03/30/21 Recommended yearly ophthalmology/optometry visit for glaucoma screening and checkup Recommended yearly dental visit for hygiene and checkup  Vaccinations: Influenza vaccine: done 10/20/20 repeat every year  Pneumococcal vaccine: discontinued  Tdap vaccine: done 04/04/13 repeat every 10 years  Shingles vaccine: Shingrix discussed. Please contact your pharmacy for coverage information.    Covid-19:completed 2/8, 3/3, 02/05/20 & 02/03/21  Advanced directives: copies in chart   Conditions/risks identified: stay healthy   Next appointment: Follow up in one year for your annual wellness visit    Preventive Care 65 Years and Older, Female Preventive care refers to lifestyle choices and visits with your health care provider that can promote health and wellness. What does preventive care include? A yearly physical exam. This is also called an annual well check. Dental exams once or twice a year. Routine eye exams. Ask your health care provider how often you should have your eyes checked. Personal lifestyle choices, including: Daily care of your teeth and gums. Regular physical activity. Eating a healthy diet. Avoiding tobacco and drug use. Limiting alcohol use. Practicing safe sex. Taking low-dose aspirin every day. Taking vitamin and mineral supplements as recommended by your health care provider. What happens during an annual well check? The services and screenings done by your health care provider during your annual well check will depend on your age, overall health, lifestyle risk factors, and family history of  disease. Counseling  Your health care provider may ask you questions about your: Alcohol use. Tobacco use. Drug use. Emotional well-being. Home and relationship well-being. Sexual activity. Eating habits. History of falls. Memory and ability to understand (cognition). Work and work Statistician. Reproductive health. Screening  You may have the following tests or measurements: Height, weight, and BMI. Blood pressure. Lipid and cholesterol levels. These may be checked every 5 years, or more frequently if you are over 34 years old. Skin check. Lung cancer screening. You may have this screening every year starting at age 7 if you have a 30-pack-year history of smoking and currently smoke or have quit within the past 15 years. Fecal occult blood test (FOBT) of the stool. You may have this test every year starting at age 20. Flexible sigmoidoscopy or colonoscopy. You may have a sigmoidoscopy every 5 years or a colonoscopy every 10 years starting at age 10. Hepatitis C blood test. Hepatitis B blood test. Sexually transmitted disease (STD) testing. Diabetes screening. This is done by checking your blood sugar (glucose) after you have not eaten for a while (fasting). You may have this done every 1-3 years. Bone density scan. This is done to screen for osteoporosis. You may have this done starting at age 86. Mammogram. This may be done every 1-2 years. Talk to your health care provider about how often you should have regular mammograms. Talk with your health care provider about your test results, treatment options, and if necessary, the need for more tests. Vaccines  Your health care provider may recommend certain vaccines, such as: Influenza vaccine. This is recommended every year. Tetanus, diphtheria, and acellular pertussis (Tdap, Td) vaccine. You may need a Td booster every 10 years. Zoster vaccine. You may need this after  age 64. Pneumococcal 13-valent conjugate (PCV13) vaccine. One  dose is recommended after age 56. Pneumococcal polysaccharide (PPSV23) vaccine. One dose is recommended after age 8. Talk to your health care provider about which screenings and vaccines you need and how often you need them. This information is not intended to replace advice given to you by your health care provider. Make sure you discuss any questions you have with your health care provider. Document Released: 04/04/2015 Document Revised: 11/26/2015 Document Reviewed: 01/07/2015 Elsevier Interactive Patient Education  2017 [employer] Prevention in the Home Falls can cause injuries. They can happen to people of all ages. There are many things you can do to make your home safe and to help prevent falls. What can I do on the outside of my home? Regularly fix the edges of walkways and driveways and fix any cracks. Remove anything that might make you trip as you walk through a door, such as a raised step or threshold. Trim any bushes or trees on the path to your home. Use bright outdoor lighting. Clear any walking paths of anything that might make someone trip, such as rocks or tools. Regularly check to see if handrails are loose or broken. Make sure that both sides of any steps have handrails. Any raised decks and porches should have guardrails on the edges. Have any leaves, snow, or ice cleared regularly. Use sand or salt on walking paths during winter. Clean up any spills in your garage right away. This includes oil or grease spills. What can I do in the bathroom? Use night lights. Install grab bars by the toilet and in the tub and shower. Do not use towel bars as grab bars. Use non-skid mats or decals in the tub or shower. If you need to sit down in the shower, use a plastic, non-slip stool. Keep the floor dry. Clean up any water that spills on the floor as soon as it happens. Remove soap buildup in the tub or shower regularly. Attach bath mats securely with double-sided  non-slip rug tape. Do not have throw rugs and other things on the floor that can make you trip. What can I do in the bedroom? Use night lights. Make sure that you have a light by your bed that is easy to reach. Do not use any sheets or blankets that are too big for your bed. They should not hang down onto the floor. Have a firm chair that has side arms. You can use this for support while you get dressed. Do not have throw rugs and other things on the floor that can make you trip. What can I do in the kitchen? Clean up any spills right away. Avoid walking on wet floors. Keep items that you use a lot in easy-to-reach places. If you need to reach something above you, use a strong step stool that has a grab bar. Keep electrical cords out of the way. Do not use floor polish or wax that makes floors slippery. If you must use wax, use non-skid floor wax. Do not have throw rugs and other things on the floor that can make you trip. What can I do with my stairs? Do not leave any items on the stairs. Make sure that there are handrails on both sides of the stairs and use them. Fix handrails that are broken or loose. Make sure that handrails are as long as the stairways. Check any carpeting to make sure that it is firmly attached to the stairs.  Fix any carpet that is loose or worn. Avoid having throw rugs at the top or bottom of the stairs. If you do have throw rugs, attach them to the floor with carpet tape. Make sure that you have a light switch at the top of the stairs and the bottom of the stairs. If you do not have them, ask someone to add them for you. What else can I do to help prevent falls? Wear shoes that: Do not have high heels. Have rubber bottoms. Are comfortable and fit you well. Are closed at the toe. Do not wear sandals. If you use a stepladder: Make sure that it is fully opened. Do not climb a closed stepladder. Make sure that both sides of the stepladder are locked into place. Ask  someone to hold it for you, if possible. Clearly mark and make sure that you can see: Any grab bars or handrails. First and last steps. Where the edge of each step is. Use tools that help you move around (mobility aids) if they are needed. These include: Canes. Walkers. Scooters. Crutches. Turn on the lights when you go into a dark area. Replace any light bulbs as soon as they burn out. Set up your furniture so you have a clear path. Avoid moving your furniture around. If any of your floors are uneven, fix them. If there are any pets around you, be aware of where they are. Review your medicines with your doctor. Some medicines can make you feel dizzy. This can increase your chance of falling. Ask your doctor what other things that you can do to help prevent falls. This information is not intended to replace advice given to you by your health care provider. Make sure you discuss any questions you have with your health care provider. Document Released: 01/02/2009 Document Revised: 08/14/2015 Document Reviewed: 04/12/2014 Elsevier Interactive Patient Education  2017 [employer].

## 2021-10-08 NOTE — Progress Notes (Addendum)
Subjective:   Lori [name] is a 81 y.o. female who presents for Medicare Annual (Subsequent) preventive examination.  Review of Systems     Cardiac Risk Factors include: advanced age (>47mn, >>10women);dyslipidemia;hypertension     Objective:    Today's Vitals   10/08/21 1428  BP: 124/68  Pulse: 72  Temp: 97.8 F (36.6 C)  SpO2: 96%  Weight: 117 lb 6.4 oz (53.3 kg)   Body mass index is 18.95 kg/m.     10/08/2021    2:41 PM 07/10/2021    9:36 AM 04/08/2021    9:47 AM 11/05/2020    7:05 AM 10/28/2020    4:24 PM 09/25/2020    2:43 PM 01/18/2020   11:48 AM  Advanced Directives  Does Patient Have a Medical Advance Directive? Yes Yes No Yes Yes Yes No  Type of AParamed[place]of [place] will  [place] will [place]Living will Living will   Does patient want to make changes to medical advance directive?  No - Patient declined  No - Patient declined     Copy of [place] Chart? Yes - validated most recent copy scanned in chart (See row information) No - copy requested  No - copy requested     Would patient like information on creating a medical advance directive?   No - Patient declined        Current Medications (verified) Outpatient Encounter Medications as of 10/08/2021  Medication Sig   amLODipine (NORVASC) 2.5 MG tablet TAKE 1 TABLET BY MOUTH EVERY DAY   aspirin 81 MG tablet Take 81 mg by mouth daily. Evening   cyanocobalamin 1000 MCG tablet Take 1,000 mcg by mouth once a week.   D 1000 25 MCG (1000 UT) capsule SMARTSIG:1 By Mouth   letrozole (FEMARA) 2.5 MG tablet TAKE 1 TABLET BY MOUTH EVERY DAY   loratadine (CLARITIN) 10 MG tablet Take 10 mg by mouth daily. Take 1/2 tablet daily   metoprolol tartrate (LOPRESSOR) 50 MG tablet TAKE 1/2 TABLET BY MOUTH 2 TIMES DAILY   montelukast (SINGULAIR) 10 MG tablet TAKE 1 TABLET BY MOUTH AT BEDTIME  (Patient taking differently: 4 mg.)   ondansetron (ZOFRAN-ODT) 4 MG disintegrating tablet Take 1 tablet (4 mg total) by mouth every 8 (eight) hours as needed for nausea or vomiting.   pantoprazole (PROTONIX) 40 MG tablet TAKE 1 TABLET BY MOUTH EVERY DAY   rosuvastatin (CRESTOR) 10 MG tablet Take 1 tablet (10 mg total) by mouth once a week.   Vitamin D, Cholecalciferol, 1000 units TABS Take by mouth daily. Morning   No facility-administered encounter medications on file as of 10/08/2021.    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   Anxiety    Aortic atherosclerosis (HCC)    Arthritis    knee- DJD    Barrett's esophagus    Breast cancer (HCC)    right breast IDC   Burning mouth syndrome    Dr BRedmond Baseman9-2018 - no smell or taste x 4 yrs per pt    Cataract    bilateral    [place]otting disorder [place]   disected  carotid 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    Eczema    Family history of breast cancer    Family history of kidney cancer    Family history of multiple myeloma    Family history of thyroid cancer    GERD (gastroesophageal reflux disease)    Headache(784.0)    History of IBS    History of kidney stones    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    Hyperlipidemia    on medication   Hypertension    Low back pain    Meniere disease    Neuromuscular disorder ([place])    raynaud's   Osteopenia    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[place])    Stroke ([place]) 1988   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 carotid artery dissection    Tubular adenoma of colon 02/2013   Varicose veins with inflammation    upper and lower per pt    Vasculitis [facility])    Past Surgical  History:  Procedure Laterality Date   arthroscopic knee  2009   left knee/ torn meniscus   BREAST LUMPECTOMY WITH RADIOACTIVE SEED LOCALIZATION Right 11/05/2020   Procedure: RIGHT BREAST LUMPECTOMY WITH RADIOACTIVE SEED LOCALIZATION;  Surgeon: Erroll Luna, MD;  Location: [facility];  Service: General;  Laterality: Right;   BUNIONECTOMY Right 1998   with other foot surgery    carotid artery disection  1988   Carotid Artery Dissection   CATARACT EXTRACTION, BILATERAL  07-18-2017,08-08-2017   [place]   1 time   COLONOSCOPY  2019   last 2019   [place] OF UTERUS  2004   EYE SURGERY     EYE SURGERY  09/25/2021   lowere lid of left eye   POLYPECTOMY     POPLITEAL SYNOVIAL CYST EXCISION     left leg   TONSILLECTOMY  1957   UPPER GASTROINTESTINAL ENDOSCOPY     last 2018   Family History  Problem Relation Age of Onset   Multiple myeloma Father 74   Thyroid cancer Sister 66       s/p removal. papilary and anaplastic.    Lung cancer Sister    Kidney disease Brother        cancer- removed   Kidney cancer Brother        dx early 61s   Alzheimer's disease Brother    Stroke Brother 39   Breast cancer Cousin 73       paternal first cousin   Cancer Cousin        unknown type, paternal first cousin   Breast cancer Other        mother's first cousin   Cervical cancer Other    Colon cancer Neg Hx    Esophageal cancer Neg Hx    Rectal cancer Neg Hx    Stomach cancer Neg Hx    Colon polyps Neg Hx    Social History   Socioeconomic History   Marital status: Married    Spouse name: Not on file   Number of children: Not on file   Years of education: Not on file   Highest education level: Not on file  Occupational History   Occupation: retired  Tobacco Use   Smoking status: Never   Smokeless tobacco: Never  Substance and Sexual Activity   Alcohol use: No    Alcohol/week: 0.0 standard drinks of alcohol   Drug use: No   Sexual  activity: Yes    Birth control/protection: Post-menopausal  Other Topics Concern   Not on file  Social History Narrative   Lives with husband who is also a patient of Dr. Yong Channel. Alvester Chou [address] 51, Karleen [place] (sees Dr. Yong Channel), Judson Roch 66. 3 grandkids in 2022   Social Determinants of Health   Financial Resource Strain: Low Risk  (10/08/2021)   Overall Financial Resource Strain (CARDIA)    Difficulty of Paying Living Expenses: Not hard at all  Food Insecurity: No Food Insecurity (10/08/2021)   Hunger Vital Sign    Worried About Running Out of Food in the Last Year: Never true    Ran Out of Food in the Last Year: Never true  Transportation Needs: No Transportation Needs (10/08/2021)   PRAPARE - Hydrologist (Medical): No    Lack of Transportation (Non-Medical): No  Physical Activity: Inactive (10/08/2021)   Exercise Vital Sign    Days of Exercise per Week: 0 days    Minutes of Exercise per Session: 0 min  Stress: No Stress Concern Present (10/08/2021)   [place]    Feeling of Stress : Not at all  Social Connections: [place] (10/08/2021)   Social Connection and Isolation Panel [NHANES]    Frequency of Communication with Friends and Family: More than three times a week    Frequency of Social Gatherings with Friends and Family: More than three times a week    Attends Religious Services: 1 to 4 times per year    Active Member of [employer] or Organizations: Yes    Attends Archivist Meetings: 1 to 4 times per year    Marital Status: Married    Tobacco Counseling Counseling given: Not Answered   Clinical Intake:  Pre-visit preparation completed: Yes  Pain : No/denies pain     BMI - recorded: 18.95 Nutritional Status: BMI <19  Underweight Nutritional Risks: None Diabetes: No  How often do you need to have someone help you when you read instructions, pamphlets, or  other written materials from your doctor or pharmacy?: 1 - Never  Diabetic?no  Interpreter Needed?: No  Information entered by :: Charlott Rakes, LPN   Activities of Daily Living    10/08/2021    2:42 PM 11/05/2020    7:13 AM  In your present state of health, do you have any difficulty performing the following activities:  Hearing? 0 0  Vision? 0 0  Difficulty concentrating or making decisions? 0 0  Walking or climbing stairs? 0 0  Dressing or bathing? 0 0  Doing errands, shopping? 0   Preparing Food and eating ? N   Using the Toilet? N   In the past six months, have you accidently leaked urine? N   Do you have problems with loss of bowel control? N   Managing your Medications? N   Managing your Finances? N   Housekeeping or managing your Housekeeping? N     Patient Care Team: [name], MD as PCP - General (Family Medicine) Melida Quitter, MD as Consulting Physician (Otolaryngology) Ladene Artist, MD as Consulting Physician (Gastroenterology) Harriett Sine, MD as Consulting Physician (Dermatology) Erroll Luna, MD as Consulting Physician (General Surgery) Nicholas Lose, MD as Consulting Physician (Hematology and Oncology) Kyung Rudd, MD as Consulting Physician ([place]) Edythe Clarity, [facility] as Pharmacist (Pharmacist)  Indicate any recent Medical Services you may have received from other than [facility] providers in the past year (date may be approximate).  Assessment:   This is a routine wellness examination for [place].  Hearing/Vision screen Hearing Screening - Comments:: Pt stated no hearing issues Vision Screening - Comments:: [place] up with Dr Redmond Baseman for annual eye exams   Dietary issues and exercise activities discussed: Current Exercise Habits: The patient does not participate in regular exercise at present   Goals Addressed             This Visit's Progress    Patient Stated       Stay healthy        Depression  Screen    10/08/2021    2:39 PM 01/02/2021   10:30 AM 09/25/2020    2:42 PM 09/03/2020   10:02 AM 12/27/2019   10:40 AM 12/01/2018    1:46 PM 04/25/2018   11:15 AM  PHQ 2/9 Scores  PHQ - 2 Score 0 0 0 0 0 0 0  PHQ- 9 Score    0   0    Fall Risk    10/08/2021    2:42 PM 06/15/2021   10:14 AM 02/27/2021    2:47 PM 01/02/2021   10:30 AM 09/25/2020    2:44 PM  [place] in the past year? 0 0 0 0 0  Number falls in past yr: 0 0  0 0  Injury with Fall? 0 0  0 0  Risk for fall due to : Impaired vision;Impaired balance/gait Impaired balance/gait  No Fall Risks Impaired vision;Impaired balance/gait  Risk for fall due to: Comment     vertigo at times  Follow up Falls prevention discussed Falls evaluation completed;Education provided;Falls prevention discussed  Falls evaluation completed Falls prevention discussed    FALL RISK PREVENTION PERTAINING TO THE HOME:  Any stairs in or around the home? No  If so, are there any without handrails? No  Home free of loose throw rugs in walkways, pet beds, electrical cords, etc? Yes  Adequate lighting in your home to reduce risk of falls? Yes   ASSISTIVE DEVICES UTILIZED TO PREVENT FALLS:  Life alert? No  Use of a cane, walker or w/c? Yes  Grab bars in the bathroom? Yes  Shower chair or bench in shower? Yes  Elevated toilet seat or a handicapped toilet? Yes   TIMED UP AND GO:  Was the test performed? Yes .  Length of time to ambulate 10 feet: 10 sec.   Gait slow and steady with assistive device  Cognitive Function:        10/08/2021    2:43 PM 09/25/2020    2:47 PM 12/01/2018    1:47 PM  6CIT Screen  What Year? 0 points 0 points 0 points  What month? 0 points 0 points 0 points  What time? 0 points 0 points 0 points  Count back from 20 0 points 0 points 0 points  Months in reverse 0 points 0 points 0 points  Repeat phrase 0 points 2 points 0 points  Total Score 0 points 2 points 0 points    Immunizations Immunization History   Administered Date(s) Administered   Fluad Quad(high Dose 65+) 12/01/2018, 12/27/2019, 12/01/2020   Influenza Split 01/04/2012   Influenza Whole 01/03/2007, 12/08/2007, 12/17/2008, 01/20/2010   Influenza, High Dose Seasonal PF 01/10/2015, 12/12/2015, 12/29/2016, 12/23/2017   Influenza,inj,Quad PF,6+ Mos 01/05/2013, 12/24/2013   PFIZER(Purple Top)SARS-COV-2 Vaccination 04/30/2019, 05/23/2019, 02/05/2020   Pfizer Covid-19 Vaccine Bivalent Booster 52yr & up 02/03/2021   Pneumococcal Polysaccharide-23 02/12/2004   Td 03/23/2003, 04/04/2013  TDAP status: Up to date  Flu Vaccine status: Up to date  Pneumococcal vaccine status: Declined,  Education has been provided regarding the importance of this vaccine but patient still declined. Advised may receive this vaccine at local pharmacy or Health Dept. Aware to provide a copy of the vaccination record if obtained from local pharmacy or Health Dept. Verbalized acceptance and understanding.   Covid-19 vaccine status: Completed vaccines  Qualifies for Shingles Vaccine? Yes   Zostavax completed No   Shingrix Completed?: No.    Education has been provided regarding the importance of this vaccine. Patient has been advised to call insurance company to determine out of pocket expense if they have not yet received this vaccine. Advised may also receive vaccine at local pharmacy or Health Dept. Verbalized acceptance and understanding.  Screening Tests Health Maintenance  Topic Date Due   Zoster Vaccines- Shingrix (1 of 2) Never done   MAMMOGRAM  10/02/2021   Pneumonia Vaccine 56+ Years old (2 - PCV) 05/20/2048 (Originally 03/11/2007)   INFLUENZA VACCINE  10/20/2021   TETANUS/TDAP  04/05/2023   DEXA SCAN  Completed   COVID-19 Vaccine  Completed   Hepatitis C Screening  Completed   HPV VACCINES  Aged Out    Health Maintenance  Health Maintenance Due  Topic Date Due   Zoster Vaccines- Shingrix (1 of 2) Never done   MAMMOGRAM  10/02/2021     Colorectal cancer screening: No longer required.   Mammogram status: Completed 10/02/20. Repeat every year scheduled for august 22, 23 @ 10:25 by oncology at [facility] status: Completed 03/30/21. Results reflect: Bone density results: OSTEOPOROSIS. Repeat every 2 years.     Additional Screening:  Hepatitis C Screening:  Completed 12/27/19  Vision Screening: Recommended annual ophthalmology exams for early detection of glaucoma and other disorders of the eye. Is the patient up to date with their annual eye exam?  Yes  Who is the provider or what is the name of the office in which the patient attends annual eye exams? Dr bates  If pt is not established with a provider, would they like to be referred to a provider to establish care? No .   Dental Screening: Recommended annual dental exams for proper oral hygiene  Community Resource Referral / Chronic Care Management: CRR required this visit?  No   CCM required this visit?  No      Plan:     I have personally reviewed and noted the following in the patient's chart:   Medical and social history Use of alcohol, tobacco or illicit drugs  Current medications and supplements including opioid prescriptions.  Functional ability and status Nutritional status Physical activity Advanced directives List of other physicians Hospitalizations, surgeries, and ER visits in previous 12 months Vitals Screenings to include cognitive, depression, and falls Referrals and appointments  In addition, I have reviewed and discussed with patient certain preventive protocols, quality metrics, and best practice recommendations. A written personalized care plan for preventive services as well as general preventive health recommendations were provided to patient.     Willette Brace, LPN   05/13/9796   Nurse Notes: none

## 2021-10-20 ENCOUNTER — Encounter: Payer: Self-pay | Admitting: Family Medicine

## 2021-10-22 ENCOUNTER — Ambulatory Visit (INDEPENDENT_AMBULATORY_CARE_PROVIDER_SITE_OTHER): Payer: Medicare Other | Admitting: Family Medicine

## 2021-10-22 ENCOUNTER — Encounter: Payer: Self-pay | Admitting: Family Medicine

## 2021-10-22 VITALS — BP 130/70 | HR 80 | Temp 97.4°F | Ht 66.0 in | Wt 114.8 lb

## 2021-10-22 DIAGNOSIS — E785 Hyperlipidemia, unspecified: Secondary | ICD-10-CM

## 2021-10-22 DIAGNOSIS — I10 Essential (primary) hypertension: Secondary | ICD-10-CM | POA: Diagnosis not present

## 2021-10-22 DIAGNOSIS — R42 Dizziness and giddiness: Secondary | ICD-10-CM | POA: Diagnosis not present

## 2021-10-22 DIAGNOSIS — Z8673 Personal history of transient ischemic attack (TIA), and cerebral infarction without residual deficits: Secondary | ICD-10-CM | POA: Diagnosis not present

## 2021-10-22 NOTE — Patient Instructions (Addendum)
Call [facility] back and get set up with vestibular rehab  Recommended follow up: Return for next already scheduled visit or sooner if needed.

## 2021-10-22 NOTE — Progress Notes (Signed)
Phone [phone] In person visit   Subjective:   Lori [name] is a 81 y.o. 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    Priority: Medium    Hypertension 11/29/2013    Priority: Medium    Hyperlipidemia 11/29/2013    Priority: Medium    Barrett's esophagus 08/24/2013    Priority: Medium    Allergic rhinitis 03/09/2007    Priority: Medium    Osteoporosis 09/22/2006    Priority: Medium    IBS (irritable bowel syndrome) 06/26/2019    Priority: Low   Aortic atherosclerosis ([place]) 02/11/2016    Priority: Low   Eczema 11/29/2013    Priority: Low   GERD (gastroesophageal reflux disease) 05/16/2013    Priority: Low   Vitamin D deficiency 01/29/2012    Priority: Low   Solitary pulmonary nodule 06/08/2011    Priority: Low   HIATAL HERNIA WITH REFLUX 02/06/2010    Priority: Low   DEGENERATIVE JOINT DISEASE, KNEE 03/14/2008    Priority: Low   VARICOSE VEINS LOWER EXTREMITIES W/INFLAMMATION 03/09/2007    Priority: Low   ACTINIC KERATOSIS, FOREHEAD, LEFT 03/09/2007    Priority: Low   Headache(784.0) 03/09/2007    Priority: Low   MENIERE'S DISEASE 09/22/2006    Priority: Low   RAYNAUD'S DISEASE 09/22/2006    Priority: Low   Abnormal ear sensation, right 02/27/2021    Priority: 1.   Family history of kidney cancer 10/22/2020    Priority: 1.   Family history of thyroid cancer 10/22/2020    Priority: 1.   Family history of multiple myeloma 10/22/2020    Priority: 1.    Family history of breast cancer 10/22/2020    Priority: 1.   Genetic testing 11/10/2020    Medications- reviewed and updated Current Outpatient Medications  Medication Sig Dispense Refill   amLODipine (NORVASC) 2.5 MG tablet TAKE 1 TABLET BY MOUTH EVERY DAY 90 tablet 2   aspirin 81 MG tablet Take 81 mg by mouth daily. Evening     cyanocobalamin 1000 MCG tablet Take 1,000 mcg by mouth once a week.     D 1000 25 MCG (1000 UT) capsule SMARTSIG:1 By Mouth     letrozole (FEMARA) 2.5 MG tablet TAKE 1 TABLET BY MOUTH EVERY DAY 90 tablet 3   loratadine (CLARITIN) 10 MG tablet Take 10 mg by mouth daily. Take 1/2 tablet daily     metoprolol tartrate (LOPRESSOR) 50 MG tablet TAKE 1/2 TABLET BY MOUTH 2 TIMES DAILY 90 tablet 1   montelukast (SINGULAIR) 10 MG tablet TAKE 1 TABLET BY MOUTH AT BEDTIME (Patient taking differently: 4 mg.) 90 tablet 1   ondansetron (ZOFRAN-ODT) 4 MG disintegrating tablet Take 1 tablet (4 mg total) by mouth every 8 (eight) hours as needed for nausea or vomiting. 15 tablet 0   pantoprazole (PROTONIX) 40 MG tablet TAKE 1 TABLET BY MOUTH EVERY DAY 90 tablet 1   rosuvastatin (CRESTOR) 10 MG tablet Take 1 tablet (10 mg  total) by mouth once a week. 13 tablet 3   Vitamin D, Cholecalciferol, 1000 units TABS Take by mouth daily. Morning     No current facility-administered medications for this visit.     Objective:  BP 130/70   Pulse 80   Temp (!) 97.4 F (36.3 C)   Ht 5' 6" (1.676 m)   Wt 114 lb 12.8 oz (52.1 kg)   SpO2 98%   BMI 18.53 kg/m  Gen: NAD, resting comfortably CV: RRR no murmurs rubs or gallops Lungs: CTAB no crackles, wheeze, rhonchi Ext: no edema Skin: warm, dry Neuro: CN II-XII intact, sensation and reflexes normal throughout, 5/5 muscle strength in bilateral upper and lower extremities. Normal finger to nose. Normal rapid alternating movements. No pronator drift. Normal romberg. Normal gait.  Dix hallpike positive for nystagums and recurrence of symptoms  on the left    Assessment and Plan   #Dizziness/imbalance/vertigo S: Ongoing issues for several years.  At last visit in March patient had been doing reasonably well except for one episode which we thought could be Mnire's flare.  We thought could be BPPV but duration of episodes was on the longer side  Later had covid and had worsening symptoms about a month after having covid. We saw her in march 2023. 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 Saintclair Halsted. She had follow up mr angio which was stable with Dr. Kathyrn Sheriff on 01/29/21.  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  Dr. 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  At time of last visit in 2021 for this Flonase actually seem to be most helpful and had follow-up with Dr. Redmond Baseman also to assess her parotid gland (fine-needle aspiration largely reassuring in regards to cancer)-observation was planned last seen 01/21/2021 with plan for 1 year follow-up"  We did later refer to vestibular rehab in April which she found helpful and she finished in May but has continued the exercises to some degree but decided to stop doing these as she felt so good.   She most recently saw Dr. Redmond Baseman on 09/14/2021-she was essentially better at that time and was provided reassurance. 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 Since then, she has been having trouble walking due to balance/equilibrium issues.  She is back to doing vestibular rehab exercises at home-she had stopped doing them 3 times a week for maintenance. Feels more on the left side. Feels recurrent episodes each day but not quite as severe as the 21st- tolerating it. Symptoms are worse with head movement and is careful about position changes. Feeling off balance most of the time but the severe episodes last up to 5-10 minutes- better when she does the rehab exercises. Similar to  prior episodes and most recent one that responded to vestibular rehab  No facial or extremity weakness. No slurred words or trouble swallowing. no blurry vision or double vision. No new  paresthesias. No confusion or word finding difficulties.  No hearing loss.  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  Symptoms are unchanged from last visit to vestibular rehab and neurological exam is reassuring.  Patient prefers not to have repeat imaging despite known aneurysm given similarity of symptoms-we will trial repeat vestibular rehab #We think risk of stroke is low with similarity of symptoms and no new neurological findings or symptoms-she will remain on aspirin and we will continue risk factor modification with control of blood pressure and cholesterol as below  # Hypertension S:compliant with amlodipine 2.49m, metoprolol 25 mg BID.   BP Readings from Last 3 Encounters:  10/22/21 130/70  10/08/21 124/68  09/08/21 (!) 150/72   A/P:   Controlled. Continue current medications.  I do not think blood pressure is contributing to symptoms   #  Hyperlipidemia  S:lipids have been reasonably controlled on fenofibrate.  LDL goal at least under 100 and triglyceride goal under 200.  We later added rosuvastatin 10 mg once a week given concern about prior stroke and stopped fenofibrate Lab Results  Component Value Date   CHOL 156 01/02/2021   HDL 74.80 01/02/2021   LDLCALC 74 01/02/2021   LDLDIRECT 74.0 02/06/2016   TRIG 37.0 01/02/2021   CHOLHDL 2 01/02/2021   A/P: Lipids very close to ideal control at 74 on last check-we have opted to continue current medication  Recommended follow up: Return for next already scheduled visit or sooner if needed. Future Appointments  Date Time Provider [place] 01/06/2022  9:20 AM HMarin Olp MD [facility]  03/05/2022 10:00 AM [facility] LAB [facility] None  03/05/2022 10:45 AM [facility] FLUSH [facility] None  06/21/2022  3:30 PM [facility] PHARMACIST [facility]  08/23/2022  9:30 AM [facility] LAB [facility] None  08/23/2022 10:00 AM GNicholas Lose MD [facility] None  08/23/2022 10:45 AM [facility] None  10/14/2022  2:45 PM [facility] HEALTH COACH [facility]    Lab/Order associations:   ICD-10-CM   1. Vertigo  R42 Ambulatory referral to Physical Therapy    2. Primary hypertension  I10     3. Hyperlipidemia, unspecified hyperlipidemia type  E78.5     4. History of stroke  Z86.73      Return precautions advised.  SGarret Reddish MD

## 2021-10-23 ENCOUNTER — Encounter: Payer: Self-pay | Admitting: Family Medicine

## 2021-11-02 ENCOUNTER — Ambulatory Visit (INDEPENDENT_AMBULATORY_CARE_PROVIDER_SITE_OTHER): Payer: Medicare Other | Admitting: Internal Medicine

## 2021-11-02 VITALS — BP 126/78 | HR 73 | Temp 97.6°F | Ht 66.0 in | Wt 116.8 lb

## 2021-11-02 DIAGNOSIS — I671 Cerebral aneurysm, nonruptured: Secondary | ICD-10-CM | POA: Insufficient documentation

## 2021-11-02 DIAGNOSIS — R058 Other specified cough: Secondary | ICD-10-CM

## 2021-11-02 NOTE — Progress Notes (Signed)
   Lori [name] is a 81 y.o. female who presents today for an office visit.  Assessment/Plan:    Lori [name] was seen today for cough.  Dry cough   She Used flowflex brand covid 19 test which was negative. Likely secondary to viral URI.  Less likely gerd/allergies/bacterial/fungal, although she did have some itchy ears and watery eyes at one point and has h/o LPR and barretts.  It feels just like COVID to her so I suspect viral- the cough appears to be due to PND to me. No signs of bacterial infection.  Start atrovent for rhinorrhea/sinus congestion.  Start home tessalon (already have) for cough.  Discussed options of robitussin, dayquil, mucinex, phenergan DM syrup, tessalon Avoid phenylephrine and sudafed due to age. Offered a "pocket prescription" for antibiotics with strict instruction to not start unless symptoms worsen or fail to improve within the next several days. Recommended tylenol and/or motrin as needed for low grade fever and pain.  Encouraged good oral hydration.  She gets bad reaction to flonase and nasal Return precautions reviewed. Follow up as needed.       Subjective:  HPI:  History taking was used to update the overview section of each addressed problem in the assessment/plan section above.   She c/o dry cough since 5 days ago.  Started with scratchy throat. Feels like covid but home test neg.   Itchy ears/runny eyes.  Most symptoms have improved in last day or so. Worst symptom is cough.        Objective:  Physical Exam: BP 126/78 (BP Location: Right Arm)   Pulse 73   Temp 97.6 F (36.4 C) (Temporal)   Ht '5\' 6"'$  (1.676 m)   Wt 116 lb 12.8 oz (53 kg)   SpO2 95%   BMI 18.85 kg/m    Gen: No acute distress, resting comfortably Psych: Normal affect and thought content  Problem specific physical exam findings:  Ctab, no inc WOB CVS exam: normal rate, regular rhythm, normal S1, S2, no murmurs, rubs, clicks or gallops.    No results found for any  visits on 11/02/21.        [name], MD 11/02/2021 11:41 AM

## 2021-11-02 NOTE — Patient Instructions (Addendum)
If you decided you want atrovent or chest xray then call for.  Start tessalon/benzonatate and claritin for your cough.  Call  for chest xray, atrovent, and/or antibiotic if symptoms persist more than 7-10 days  Please stay well hydrated.  You can take tylenol and/or motrin as needed for low grade fever and pain.  Please let me know if your symptoms worsen or fail to improve.  Take care, [name], MD

## 2021-11-03 ENCOUNTER — Ambulatory Visit: Payer: Medicare Other | Admitting: Rehabilitative and Restorative Service Providers"

## 2021-11-10 DIAGNOSIS — Z853 Personal history of malignant neoplasm of breast: Secondary | ICD-10-CM | POA: Diagnosis not present

## 2021-11-10 LAB — HM MAMMOGRAPHY

## 2021-11-13 ENCOUNTER — Encounter: Payer: Self-pay | Admitting: Family Medicine

## 2021-11-17 ENCOUNTER — Other Ambulatory Visit: Payer: Self-pay

## 2021-11-17 ENCOUNTER — Ambulatory Visit: Payer: Medicare Other | Attending: Family Medicine | Admitting: Rehabilitative and Restorative Service Providers"

## 2021-11-17 ENCOUNTER — Encounter: Payer: Self-pay | Admitting: Rehabilitative and Restorative Service Providers"

## 2021-11-17 DIAGNOSIS — R2689 Other abnormalities of gait and mobility: Secondary | ICD-10-CM | POA: Diagnosis not present

## 2021-11-17 DIAGNOSIS — M6281 Muscle weakness (generalized): Secondary | ICD-10-CM | POA: Diagnosis not present

## 2021-11-17 DIAGNOSIS — R42 Dizziness and giddiness: Secondary | ICD-10-CM | POA: Diagnosis not present

## 2021-11-17 DIAGNOSIS — R262 Difficulty in walking, not elsewhere classified: Secondary | ICD-10-CM | POA: Insufficient documentation

## 2021-11-17 NOTE — Therapy (Signed)
OUTPATIENT PHYSICAL THERAPY VESTIBULAR EVALUATION     Patient Name: Lori [name] MRN: [id number] DOB:October 17, 1940, 81 y.o., female Today's Date: 11/17/2021  PCP: [name], MD REFERRING PROVIDER: [name], MD   PT End of Session - 11/17/21 [phone]     Visit Number 1    Date for PT Re-Evaluation 01/08/22    Authorization Type U[employer] Medicare    PT Start Time 0800    PT Stop Time 0840    PT Time Calculation (min) 40 min    Activity Tolerance Patient tolerated treatment well    Behavior During Therapy WFL for tasks assessed/performed             Past Medical History:  Diagnosis Date   Allergy    Anemia    past hx of anemia   Aneurysm ([place])    pseudo-aneurym of carotid arteries per pt   Anxiety    Aortic atherosclerosis ([facility])    Arthritis    knee- DJD    Barrett's esophagus    Breast cancer ([place])    right breast IDC   Burning mouth syndrome    Dr Redmond Baseman 11-2016 - no smell or taste x 4 yrs per pt    Cataract    bilateral    Clotting disorder ([place]) 1988   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    Eczema    Family history of breast cancer    Family history of kidney cancer    Family history of multiple myeloma    Family history of thyroid cancer    GERD (gastroesophageal reflux disease)    Headache(784.0)    History of IBS    History of kidney stones    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    Hyperlipidemia    on medication   Hypertension    Low back pain    Meniere disease    Neuromuscular disorder ([place])    raynaud's   Osteopenia    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[place])    Stroke ([place]) 1988   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 carotid artery dissection    Tubular adenoma of colon 02/2013   Varicose veins with inflammation    upper and lower per pt    Vasculitis [facility])    Past Surgical History:  Procedure Laterality Date   arthroscopic knee  2009   left knee/ torn meniscus   BREAST LUMPECTOMY WITH RADIOACTIVE SEED LOCALIZATION Right  11/05/2020   Procedure: RIGHT BREAST LUMPECTOMY WITH RADIOACTIVE SEED LOCALIZATION;  Surgeon: Erroll Luna, MD;  Location: [place];  Service: General;  Laterality: Right;   BUNIONECTOMY Right 1998   with other foot surgery    carotid artery disection  1988   Carotid Artery Dissection   CATARACT EXTRACTION, BILATERAL  07-18-2017,08-08-2017   CESAREAN SECTION  1988   1 time   COLONOSCOPY  2019   last 2019   [place] OF UTERUS  2004   EYE SURGERY     EYE SURGERY  09/25/2021   lowere lid of left eye   POLYPECTOMY     POPLITEAL SYNOVIAL CYST EXCISION     left leg   TONSILLECTOMY  1957   UPPER GASTROINTESTINAL ENDOSCOPY     last 2018   Patient Active Problem List   Diagnosis Date Noted   Cerebral arterial aneurysm 11/02/2021   History of stroke 10/22/2021   Bilateral sensorineural hearing loss 09/14/2021   Imbalance 09/14/2021   Abnormal ear sensation, right 02/27/2021  Genetic testing 11/10/2020   Family history of kidney cancer 10/22/2020   Family history of thyroid cancer 10/22/2020   Family history of multiple myeloma 10/22/2020   Family history of breast cancer 10/22/2020   Malignant neoplasm of lower-inner quadrant of right breast of female, estrogen receptor positive ([place]) 10/17/2020   Parotid adenoma 06/26/2020   Post-nasal drainage 06/05/2020   Subjective tinnitus of both ears 06/05/2020   Vertigo 01/22/2020   IBS (irritable bowel syndrome) 06/26/2019   Osteoarthritis of left knee 09/15/2017   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   Anosmia 03/30/2017   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   Aortic atherosclerosis ([place]) 02/11/2016   Hypertension 11/29/2013   Eczema 11/29/2013   Hyperlipidemia 11/29/2013   Barrett's esophagus 08/24/2013   GERD (gastroesophageal reflux disease) 05/16/2013   Vitamin D deficiency 01/29/2012   Solitary pulmonary nodule 06/08/2011   HIATAL HERNIA WITH REFLUX 02/06/2010   DEGENERATIVE JOINT DISEASE, KNEE  03/14/2008   VARICOSE VEINS LOWER EXTREMITIES W/INFLAMMATION 03/09/2007   Allergic rhinitis 03/09/2007   ACTINIC KERATOSIS, FOREHEAD, LEFT 03/09/2007   Headache(784.0) 03/09/2007   MENIERE'S DISEASE 09/22/2006   RAYNAUD'S DISEASE 09/22/2006   Osteoporosis 09/22/2006    ONSET DATE: 10/22/2021   REFERRING DIAG: R42 (ICD-10-CM) - Vertigo   THERAPY DIAG:  Vertigo - Plan: PT plan of care cert/re-cert  Balance problem - Plan: PT plan of care cert/re-cert  Dizziness and giddiness - Plan: PT plan of care cert/re-cert  Muscle weakness (generalized) - Plan: PT plan of care cert/re-cert  Difficulty in walking, not elsewhere classified - Plan: PT plan of care cert/re-cert  SUBJECTIVE:   SUBJECTIVE STATEMENT: Pt reports that she was doing well following last admission, but she noticed that she was getting dizzy again.  She tried her exercises, but was unable to get the dizziness under control and went to Dr Yong Channel who prescribed vestibular PT again. Pt accompanied by: self  PERTINENT HISTORY: Osteopenia, borderline osteoporosis, hx LBP, Breast CA , Hx of knee surgery, varicose veins, COVID on 04/17/21, vertigo back in 12/2019, Mineare's Disease   PAIN:  Are you having pain? Yes: NPRS scale: 5-6/10 Pain location: bilateral knees Pain description: shooting Aggravating factors: walking Relieving factors: rest  PRECAUTIONS: Fall  WEIGHT BEARING RESTRICTIONS No  FALLS: Has patient fallen in last 6 months? No  LIVING ENVIRONMENT: Lives with: lives with their spouse Lives in: House/apartment Stairs: No Has following equipment at home: Single point cane, Environmental consultant - 2 wheeled, shower chair, and Grab bars  PLOF: Independent and Leisure: likes to walk with her friends in the park, walk her dogs, play with grandchildren  PATIENT GOALS:  To be able to get back to walking and driving.  OBJECTIVE:   DIAGNOSTIC FINDINGS: N/A  COGNITION: Overall cognitive status: Within functional limits  for tasks assessed   POSTURE: rounded shoulders and forward head   Cervical ROM:    Active A/PROM (deg) 11/17/2021  Flexion 40  Extension 30  Right lateral flexion 30  Left lateral flexion 20  Right rotation 40  Left rotation 40  (Blank rows = not tested)  STRENGTH:  11/17/21:  B shoulder strength of 4 to 4+/5 throughout B hip strength of 4 to 4+/5 grossly throughout   BED MOBILITY:  WFL, but reports that she sometimes has dizziness when she lies flat  GAIT: Gait pattern: antalgic, poor foot clearance- Right, and poor foot clearance- Left Distance walked: 50 Assistive device utilized: Single point cane Level of assistance: SBA Comments: Pt with unsteady gait pattern  FUNCTIONAL  TESTs:  5 times sit to stand: 15.2 sec Timed up and go (TUG): 12.7 sec without AD MCTSIB: Condition 1: Avg of 3 trials: 30 sec, Condition 2: Avg of 3 trials: 15 sec, Condition 3: Avg of 3 trials: 30 sec, Condition 4: Avg of 3 trials: 3 sec, and Total Score: 78/120  PATIENT SURVEYS:  8//2023: DHI   DHI Total Score: 38 / 100 Physical Score: 12 / 28 Emotional Score: 8 / 36 Functional Score: 18 / 36   VESTIBULAR ASSESSMENT   GENERAL OBSERVATION: Pt with unsteadiness of gait noted with slow gait velocity    SYMPTOM BEHAVIOR:   Subjective history: Pt has had a history of dizziness and Menire's Disease before, but states that this dizziness is different.   Non-Vestibular symptoms: changes in hearing and tinnitus   Type of dizziness: Imbalance (Disequilibrium)   Frequency: daily   Duration: throughout the day   Aggravating factors: Induced by position change: lying supine and supine to sit and Induced by motion: turning head quickly and driving   Relieving factors: head stationary   Progression of symptoms: unchanged   OCULOMOTOR EXAM:   Ocular Alignment: normal   Ocular ROM:  some decreased occular ROM noted   Spontaneous Nystagmus: absent   Gaze-Induced Nystagmus: absent   Smooth  Pursuits: intact   Saccades: intact      VESTIBULAR - OCULAR REFLEX:    Slow VOR: Positive Bilaterally      POSITIONAL TESTING: Left Dix-Hallpike: nystagmus noted; Duration: less than 1 min   OTHOSTATICS: not done  FUNCTIONAL GAIT:  Dynamic Gait Index: TBD   VESTIBULAR TREATMENT:  11/17/2021: Canalith Repositioning:   Epley Left: Number of Reps: 1, Response to Treatment: symptoms improved, and Comment: pt reports feeling less dizzy following one treatment of Epley    PATIENT EDUCATION: Education details: Issued HEP Person educated: Patient Education method: Explanation, Demonstration, and Handouts Education comprehension: verbalized understanding and returned demonstration  HOME EXERCISE PROGRAM: Access Code: [facility] URL: [email or web address] Date: 11/17/2021 Prepared by: Juel Burrow  Exercises - Brandt-Daroff Vestibular Exercise  - 1 x daily - 7 x weekly - 1 sets - 5 reps - Seated Gaze Stabilization with Head Rotation  - 1 x daily - 7 x weekly - 2 sets - 30 reps - Seated Gaze Stabilization with Head Nod  - 1 x daily - 7 x weekly - 3 sets - 10 reps   GOALS: Goals reviewed with patient? Yes  SHORT TERM GOALS: Target date: 12/08/2021  Pt will be independent with initial HEP. Baseline: Goal status: INITIAL  2.  Pt to report a 40% improvement in dizziness. Baseline:  Goal status: INITIAL   LONG TERM GOALS: Target date: 01/08/2022  Pt will be independent with advanced HEP. Baseline:  Goal status: INITIAL  2.  Pt will report at least a 75% improvement in dizziness symptoms since starting PT. Baseline:  Goal status: INITIAL  3.  Pt will be able to go walking with her friends for at least 30 minutes without reports of increased dizziness or loss of balance. Baseline:  Goal status: INITIAL  4.  Pt will increase modified CTSIB to at least 100/120 to decrease her risk of falling Baseline: 78/120 Goal status: INITIAL  5.  Pt will report  being able to return to driving without increased dizziness. Baseline: unable to drive Goal status: INITIAL  6.  Pt to increase bilat UE/LE strength to at least 4+/5 to allow her to perform functional activities in her home with  increased ease.  Baseline:  4 to 4+/5  Goal status:  INITIAL   ASSESSMENT:  CLINICAL IMPRESSION: Patient is a 81 y.o. female who was seen today for physical therapy evaluation and treatment for vertigo. Pt reports that she had been doing better following her last session in PT secondary to vestibular impairments, but following her eye procedure, she started noting some return of symptoms.  Pt admits that she had stopped performing her exercises, but has since started them back and does report that she is feeling better.  Pt with slight nystagmus noted with Marye Round to left side and proceeded to treatment with Epley Maneuver.  Pt with some generalized weakness noted as well.  Pt would benefit from skilled PT to address her functional impairments to allow her to return to driving and walking with her friends.   OBJECTIVE IMPAIRMENTS Abnormal gait, decreased balance, difficulty walking, decreased strength, dizziness, postural dysfunction, and pain.   ACTIVITY LIMITATIONS community activity and driving.   PERSONAL FACTORS Age and 3+ comorbidities: recent Covid-19, Menire's Disease, osteopenia  are also affecting patient's functional outcome.    REHAB POTENTIAL: Good  CLINICAL DECISION MAKING: Evolving/moderate complexity  EVALUATION COMPLEXITY: Moderate   PLAN: PT FREQUENCY: 1-2x/week  PT DURATION: 8 weeks  PLANNED INTERVENTIONS: Therapeutic exercises, Therapeutic activity, Neuromuscular re-education, Balance training, Gait training, Patient/Family education, Joint mobilization, Stair training, Vestibular training, Canalith repositioning, Aquatic Therapy, Dry Needling, Cryotherapy, Moist heat, Taping, and Manual therapy  PLAN FOR NEXT SESSION: Canalith  repositioning as indicated, balance, gaze habituation   Juel Burrow, PT 11/17/2021, 9:06 AM   [facility] [address], [address] [place], [place] [address] Phone # [phone] Fax [phone]

## 2021-11-24 ENCOUNTER — Other Ambulatory Visit: Payer: Self-pay | Admitting: Family Medicine

## 2021-11-25 ENCOUNTER — Encounter: Payer: Self-pay | Admitting: Family Medicine

## 2021-11-25 ENCOUNTER — Ambulatory Visit (INDEPENDENT_AMBULATORY_CARE_PROVIDER_SITE_OTHER): Payer: Medicare Other | Admitting: Family Medicine

## 2021-11-25 VITALS — BP 120/70 | HR 65 | Temp 97.3°F | Ht 66.0 in | Wt 115.8 lb

## 2021-11-25 DIAGNOSIS — I10 Essential (primary) hypertension: Secondary | ICD-10-CM | POA: Diagnosis not present

## 2021-11-25 DIAGNOSIS — E785 Hyperlipidemia, unspecified: Secondary | ICD-10-CM

## 2021-11-25 NOTE — Progress Notes (Signed)
Phone [phone] In person visit   Subjective:   Lori [name] is a 81 y.o. year old very pleasant female patient who presents for/with See problem oriented charting Chief Complaint  Patient presents with   Follow-up    Pt is here to f/u on balance issues, states she feels better today but yesterday was not good. She has not had any vertigo flares since 07/21.    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    Priority: Medium    Hypertension 11/29/2013    Priority: Medium    Hyperlipidemia 11/29/2013    Priority: Medium    Barrett's esophagus 08/24/2013    Priority: Medium    Allergic rhinitis 03/09/2007    Priority: Medium    Osteoporosis 09/22/2006    Priority: Medium    IBS (irritable bowel syndrome) 06/26/2019    Priority: Low   Aortic atherosclerosis ([place]) 02/11/2016    Priority: Low   Eczema 11/29/2013    Priority: Low   GERD (gastroesophageal reflux disease) 05/16/2013    Priority: Low   Vitamin D deficiency 01/29/2012    Priority: Low   Solitary pulmonary nodule 06/08/2011    Priority: Low   HIATAL HERNIA WITH REFLUX 02/06/2010    Priority: Low   DEGENERATIVE JOINT DISEASE, KNEE 03/14/2008    Priority: Low   VARICOSE VEINS LOWER EXTREMITIES W/INFLAMMATION 03/09/2007    Priority: Low   ACTINIC KERATOSIS, FOREHEAD, LEFT 03/09/2007    Priority: Low   Headache(784.0) 03/09/2007    Priority: Low   MENIERE'S DISEASE 09/22/2006    Priority: Low   RAYNAUD'S DISEASE 09/22/2006    Priority: Low   Abnormal ear sensation, right 02/27/2021    Priority: 1.   Family history of kidney cancer 10/22/2020     Priority: 1.   Family history of thyroid cancer 10/22/2020    Priority: 1.   Family history of multiple myeloma 10/22/2020    Priority: 1.   Family history of breast cancer 10/22/2020    Priority: 1.   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 Evening     bacitracin-polymyxin b (POLYSPORIN) ophthalmic ointment      cyanocobalamin 1000 MCG tablet Take 1,000 mcg by mouth once a week.     letrozole (FEMARA) 2.5 MG tablet TAKE 1 TABLET BY MOUTH EVERY DAY 90 tablet 3   loratadine (CLARITIN) 10 MG tablet Take 10 mg by mouth daily. Take 1/2 tablet daily     metoprolol tartrate (LOPRESSOR) 50 MG tablet TAKE 1/2 TABLET BY MOUTH 2 TIMES DAILY 90 tablet 1   montelukast (SINGULAIR) 10 MG tablet TAKE 1  TABLET BY MOUTH AT BEDTIME 90 tablet 1   ondansetron (ZOFRAN-ODT) 4 MG disintegrating tablet Take 1 tablet (4 mg total) by mouth every 8 (eight) hours as needed for nausea or vomiting. 15 tablet 0   pantoprazole (PROTONIX) 40 MG tablet TAKE 1 TABLET BY MOUTH EVERY DAY 90 tablet 1   rosuvastatin (CRESTOR) 10 MG tablet Take 1 tablet (10 mg total) by mouth once a week. 13 tablet 3   No current facility-administered medications for this visit.     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  Earlier this year she had flareup after COVID she has had relief with vestibular rehab with Rachel Moulds in the past and had a reassuring neurological exam last visit so we opted to refer her back to vestibular rehab with Shanda(after flare up in July for eye surgery) - 2nd visit in late august with epley maneuver had eye strain and headaches behind left eye for a few days .  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 Kathyrn Sheriff  Today she reports ongoing intermittent issues (but overall much better than when this flare started)- yesterday was bothersome but feels better today (but not as severe as prior episodes). Has another visit on September 8th planned with vestibular rehab. Has another visit with eye doctor with ongoing drainage from left eye and wonders if will need another eyelid procedure. Also has a hollow sensation in ear and through forehead at times when bites down- starting back in July. No longer takes meclizine as makes her feel tired. Will be seeing Dr. Kathyrn Sheriff int he fall. No hearing loss or tinnitus. Motion is still a trigger for her and gets nausea. Feels sensation of motion in head but does not feel obvious room spinning. Doing home exercises A/P: trajectory overall improving but still having ups and downs in what still sounds like BPPV. Does not tolerate meclizine due to fatigue and think other meds would cause similar. She will continue rehab- hold off on early repeat neuroimaging as will be having  neurosurgery consult soon in fall- keep follow up with me in one month - history of meniere's but since 2021 has not seemed to present with this- more BPPV concern  # Hypertension S:compliant with amlodipine 2.45m, metoprolol 25 mg BID.   BP Readings from Last 3 Encounters:  11/25/21 120/70  11/02/21 126/78  10/22/21 130/70  A/P:  Controlled. Continue current medications.   # Hyperlipidemia/aortic atherosclerosis  S:medication: rosuvastatin 10 mg once a week -prior fenofbrate  LDL goal at least under 100 and triglyceride goal under 200.  Lab Results  Component Value Date   CHOL 156 01/02/2021   HDL 74.80 01/02/2021   LDLCALC 74 01/02/2021   LDLDIRECT 74.0 02/06/2016  TRIG 37.0 01/02/2021   CHOLHDL 2 01/02/2021  A/P: Technically with aortic atherosclerosis could push for LDL under 70 (presumed atherosclerosis stable) but given difficulty tolerating medications in the past and her age we opted to continue current dose  Recommended follow up: Return for next already scheduled visit or sooner if needed. Future Appointments  Date Time Provider [place]  11/27/2021  8:00 AM Menke, [place], PT [facility] None  12/01/2021 12:30 PM Menke, Shaunda, PT [facility] None  12/07/2021  9:30 AM Menke, Shaunda, PT [facility] None  12/14/2021  9:30 AM Menke, Shaunda, PT [facility] None  12/21/2021  9:30 AM Menke, Shaunda, PT [facility] None  12/28/2021  9:30 AM Menke, Shaunda, PT [facility] None  01/05/2022  9:30 AM Menke, Shaunda, PT [facility] None  01/06/2022  9:20 AM [name], MD [facility]  03/05/2022 10:00 AM [facility] LAB [facility] None  03/05/2022 10:45 AM [facility] [place] FLUSH [facility] None  06/21/2022  3:30 PM [facility] PHARMACIST [facility]  08/23/2022  9:30 AM [facility] LAB [facility] None  08/23/2022 10:00 AM Nicholas Lose, MD [facility] None  08/23/2022 10:45 AM [facility] [place] FLUSH [facility] None  10/14/2022  2:45 PM [facility] HEALTH COACH [facility]    Lab/Order  associations: No diagnosis found.  No orders of the defined types were placed in this encounter.   Return precautions advised.  Garret Reddish, MD

## 2021-11-25 NOTE — Patient Instructions (Addendum)
Flu shot (high dose) we should have these available within a month or two but please let us know if you get at outside pharmacy  Continue working with Lori [name]- appears you are making steady progress  Recommended follow up: Return for next already scheduled visit or sooner if needed.

## 2021-11-27 ENCOUNTER — Encounter: Payer: Self-pay | Admitting: Rehabilitative and Restorative Service Providers"

## 2021-11-27 ENCOUNTER — Ambulatory Visit: Payer: Medicare Other | Attending: Family Medicine | Admitting: Rehabilitative and Restorative Service Providers"

## 2021-11-27 DIAGNOSIS — R2689 Other abnormalities of gait and mobility: Secondary | ICD-10-CM | POA: Insufficient documentation

## 2021-11-27 DIAGNOSIS — R42 Dizziness and giddiness: Secondary | ICD-10-CM | POA: Diagnosis not present

## 2021-11-27 DIAGNOSIS — R262 Difficulty in walking, not elsewhere classified: Secondary | ICD-10-CM | POA: Diagnosis not present

## 2021-11-27 DIAGNOSIS — M6281 Muscle weakness (generalized): Secondary | ICD-10-CM | POA: Insufficient documentation

## 2021-11-27 NOTE — Therapy (Signed)
OUTPATIENT PHYSICAL THERAPY VESTIBULAR TREATMENT NOTE     Patient Name: Lori [name] MRN: [id number] DOB:Oct 31, 1940, 81 y.o., female Today's Date: 11/27/2021  PCP: [name], MD REFERRING PROVIDER: [name], MD   PT End of Session - 11/27/21 0755     Visit Number 2    Date for PT Re-Evaluation 01/08/22    Authorization Type [employer] Medicare    PT Start Time 0752    PT Stop Time 0830    PT Time Calculation (min) 38 min    Activity Tolerance Patient tolerated treatment well    Behavior During Therapy WFL for tasks assessed/performed             Past Medical History:  Diagnosis Date   Allergy    Anemia    past hx of anemia   Aneurysm ([place])    pseudo-aneurym of carotid arteries per pt   Anxiety    Aortic atherosclerosis ([facility])    Arthritis    knee- DJD    Barrett's esophagus    Breast cancer ([place])    right breast IDC   Burning mouth syndrome    Dr Redmond Baseman 11-2016 - no smell or taste x 4 yrs per pt    Cataract    bilateral    Clotting disorder ([place]) 1988   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    Eczema    Family history of breast cancer    Family history of kidney cancer    Family history of multiple myeloma    Family history of thyroid cancer    GERD (gastroesophageal reflux disease)    Headache(784.0)    History of IBS    History of kidney stones    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    Hyperlipidemia    on medication   Hypertension    Low back pain    Meniere disease    Neuromuscular disorder ([place])    raynaud's   Osteopenia    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[place])    Stroke ([place]) 1988   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 carotid artery dissection    Tubular adenoma of colon 02/2013   Varicose veins with inflammation    upper and lower per pt    Vasculitis [facility])    Past Surgical History:  Procedure Laterality Date   arthroscopic knee  2009   left knee/ torn meniscus   BREAST LUMPECTOMY WITH RADIOACTIVE SEED LOCALIZATION Right  11/05/2020   Procedure: RIGHT BREAST LUMPECTOMY WITH RADIOACTIVE SEED LOCALIZATION;  Surgeon: Erroll Luna, MD;  Location: [facility];  Service: General;  Laterality: Right;   BUNIONECTOMY Right 1998   with other foot surgery    carotid artery disection  1988   Carotid Artery Dissection   CATARACT EXTRACTION, BILATERAL  07-18-2017,08-08-2017   CESAREAN SECTION  1988   1 time   COLONOSCOPY  2019   last 2019   [place] OF UTERUS  2004   EYE SURGERY     EYE SURGERY  09/25/2021   lowere lid of left eye   POLYPECTOMY     POPLITEAL SYNOVIAL CYST EXCISION     left leg   TONSILLECTOMY  1957   UPPER GASTROINTESTINAL ENDOSCOPY     last 2018   Patient Active Problem List   Diagnosis Date Noted   Cerebral arterial aneurysm 11/02/2021   History of stroke 10/22/2021   Bilateral sensorineural hearing loss 09/14/2021   Imbalance 09/14/2021   Abnormal ear sensation, right  02/27/2021   Genetic testing 11/10/2020   Family history of kidney cancer 10/22/2020   Family history of thyroid cancer 10/22/2020   Family history of multiple myeloma 10/22/2020   Family history of breast cancer 10/22/2020   Malignant neoplasm of lower-inner quadrant of right breast of female, estrogen receptor positive ([place]) 10/17/2020   Parotid adenoma 06/26/2020   Post-nasal drainage 06/05/2020   Subjective tinnitus of both ears 06/05/2020   Vertigo 01/22/2020   IBS (irritable bowel syndrome) 06/26/2019   Osteoarthritis of left knee 09/15/2017   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   Anosmia 03/30/2017   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   Aortic atherosclerosis ([place]) 02/11/2016   Hypertension 11/29/2013   Eczema 11/29/2013   Hyperlipidemia 11/29/2013   Barrett's esophagus 08/24/2013   GERD (gastroesophageal reflux disease) 05/16/2013   Vitamin D deficiency 01/29/2012   Solitary pulmonary nodule 06/08/2011   HIATAL HERNIA WITH REFLUX 02/06/2010   DEGENERATIVE JOINT DISEASE, KNEE  03/14/2008   VARICOSE VEINS LOWER EXTREMITIES W/INFLAMMATION 03/09/2007   Allergic rhinitis 03/09/2007   ACTINIC KERATOSIS, FOREHEAD, LEFT 03/09/2007   Headache(784.0) 03/09/2007   MENIERE'S DISEASE 09/22/2006   RAYNAUD'S DISEASE 09/22/2006   Osteoporosis 09/22/2006    ONSET DATE: 10/22/2021   REFERRING DIAG: R42 (ICD-10-CM) - Vertigo   THERAPY DIAG:  Vertigo  Balance problem  Dizziness and giddiness  Muscle weakness (generalized)  Difficulty in walking, not elsewhere classified  SUBJECTIVE:   SUBJECTIVE STATEMENT: Pt reports that she is still having dizziness, but at times not as bad. Pt accompanied by: self  PERTINENT HISTORY: Osteopenia, borderline osteoporosis, hx LBP, Breast CA , Hx of knee surgery, varicose veins, COVID on 04/17/21, vertigo back in 12/2019, Mineare's Disease   PAIN:  Are you having pain? Yes: NPRS scale: 3/10 Pain location: bilateral knees Pain description: shooting Aggravating factors: walking Relieving factors: rest  PRECAUTIONS: Fall  WEIGHT BEARING RESTRICTIONS No  FALLS: Has patient fallen in last 6 months? No  LIVING ENVIRONMENT: Lives with: lives with their spouse Lives in: House/apartment Stairs: No Has following equipment at home: Single point cane, Environmental consultant - 2 wheeled, shower chair, and Grab bars  PLOF: Independent and Leisure: likes to walk with her friends in the park, walk her dogs, play with grandchildren  PATIENT GOALS:  To be able to get back to walking and driving.  OBJECTIVE:   DIAGNOSTIC FINDINGS: N/A  COGNITION: Overall cognitive status: Within functional limits for tasks assessed   POSTURE: rounded shoulders and forward head   Cervical ROM:    Active A/PROM (deg) 11/17/2021  Flexion 40  Extension 30  Right lateral flexion 30  Left lateral flexion 20  Right rotation 40  Left rotation 40  (Blank rows = not tested)  STRENGTH:  11/17/21:  B shoulder strength of 4 to 4+/5 throughout B hip strength of 4  to 4+/5 grossly throughout   BED MOBILITY:  WFL, but reports that she sometimes has dizziness when she lies flat  GAIT: Gait pattern: antalgic, poor foot clearance- Right, and poor foot clearance- Left Distance walked: 50 Assistive device utilized: Single point cane Level of assistance: SBA Comments: Pt with unsteady gait pattern  FUNCTIONAL TESTs:  5 times sit to stand: 15.2 sec Timed up and go (TUG): 12.7 sec without AD MCTSIB: Condition 1: Avg of 3 trials: 30 sec, Condition 2: Avg of 3 trials: 15 sec, Condition 3: Avg of 3 trials: 30 sec, Condition 4: Avg of 3 trials: 3 sec, and Total Score: 78/120  PATIENT SURVEYS:  8//2023: [place]  DHI Total Score: 38 / 100 Physical Score: 12 / 28 Emotional Score: 8 / 36 Functional Score: 18 / 36   VESTIBULAR ASSESSMENT   GENERAL OBSERVATION: Pt with unsteadiness of gait noted with slow gait velocity    SYMPTOM BEHAVIOR:   Subjective history: Pt has had a history of dizziness and Menire's Disease before, but states that this dizziness is different.   Non-Vestibular symptoms: changes in hearing and tinnitus   Type of dizziness: Imbalance (Disequilibrium)   Frequency: daily   Duration: throughout the day   Aggravating factors: Induced by position change: lying supine and supine to sit and Induced by motion: turning head quickly and driving   Relieving factors: head stationary   Progression of symptoms: unchanged   OCULOMOTOR EXAM:   Ocular Alignment: normal   Ocular ROM:  some decreased occular ROM noted   Spontaneous Nystagmus: absent   Gaze-Induced Nystagmus: absent   Smooth Pursuits: intact   Saccades: intact      VESTIBULAR - OCULAR REFLEX:    Slow VOR: Positive Bilaterally      POSITIONAL TESTING: Left Dix-Hallpike: nystagmus noted; Duration: less than 1 min   OTHOSTATICS: not done  FUNCTIONAL GAIT:    DYNAMIC GAIT INDEX 3-Normal, 2-Mild Impairment, 1-Moderate Impairments, 0-Severe Impairment  11/27/2021  LEVEL  SURFACE 2  CHANGE IN GAIT SPEED 2  GAIT WITH HORIZONTAL HEAD TURNS 1  GAIT WITH VERTICAL HEAD TURNS 1  GAIT AND PIVOT TURN 2  STEP OVER OBSTACLE 1  STEP AROUND OBSTACLES 2  STEPS 2  TOTAL 13/24     VESTIBULAR TREATMENT:  11/27/2021: Miguel Dibble Hallpike, slight nystagmus bilaterally.  Performed Epley Maneuver x1 to bilateral side Standing on blue foam x1 min, standing on blue foam performing horizontal and vertical head turns x1 min each Dynamic Gait Index activities Cervical retraction 2x10 Scapular retraction 2x10   11/17/2021: Canalith Repositioning:   Epley Left: Number of Reps: 1, Response to Treatment: symptoms improved, and Comment: pt reports feeling less dizzy following one treatment of Epley    PATIENT EDUCATION: Education details: Issued HEP Person educated: Patient Education method: Explanation, Demonstration, and Handouts Education comprehension: verbalized understanding and returned demonstration  HOME EXERCISE PROGRAM: Access Code: [facility] URL: [email or web address] Date: 11/17/2021 Prepared by: Juel Burrow  Exercises - Brandt-Daroff Vestibular Exercise  - 1 x daily - 7 x weekly - 1 sets - 5 reps - Seated Gaze Stabilization with Head Rotation  - 1 x daily - 7 x weekly - 2 sets - 30 reps - Seated Gaze Stabilization with Head Nod  - 1 x daily - 7 x weekly - 3 sets - 10 reps   GOALS: Goals reviewed with patient? Yes  SHORT TERM GOALS: Target date: 12/08/2021  Pt will be independent with initial HEP. Baseline: Goal status: IN PROGRESS  2.  Pt to report a 40% improvement in dizziness. Baseline:  Goal status: IN PROGRESS   LONG TERM GOALS: Target date: 01/08/2022  Pt will be independent with advanced HEP. Baseline:  Goal status: INITIAL  2.  Pt will report at least a 75% improvement in dizziness symptoms since starting PT. Baseline:  Goal status: INITIAL  3.  Pt will be able to go walking with her friends for at least 30 minutes  without reports of increased dizziness or loss of balance. Baseline:  Goal status: INITIAL  4.  Pt will increase modified CTSIB to at least 100/120 to decrease her risk of falling Baseline: 78/120 Goal status: INITIAL  5.  Pt  will report being able to return to driving without increased dizziness. Baseline: unable to drive Goal status: INITIAL  6.  Pt to increase bilat UE/LE strength to at least 4+/5 to allow her to perform functional activities in her home with increased ease.  Baseline:  4 to 4+/5  Goal status:  INITIAL   ASSESSMENT:  CLINICAL IMPRESSION: Ms Pol presents to skilled vestibular rehab reporting that she has been doing her HEP. Pt still with some dizziness and minimal nystagmus noted in [facility] position, so proceeded with treatment to bilateral sides.  Following, pt reported that she was feeling less dizziness with static standing.  Pt able to progress with vestibular rehab with minimal reports of dizziness during more advanced tasks.   OBJECTIVE IMPAIRMENTS Abnormal gait, decreased balance, difficulty walking, decreased strength, dizziness, postural dysfunction, and pain.   ACTIVITY LIMITATIONS community activity and driving.   PERSONAL FACTORS Age and 3+ comorbidities: recent Covid-19, Menire's Disease, osteopenia  are also affecting patient's functional outcome.    REHAB POTENTIAL: Good  CLINICAL DECISION MAKING: Evolving/moderate complexity  EVALUATION COMPLEXITY: Moderate   PLAN: PT FREQUENCY: 1-2x/week  PT DURATION: 8 weeks  PLANNED INTERVENTIONS: Therapeutic exercises, Therapeutic activity, Neuromuscular re-education, Balance training, Gait training, Patient/Family education, Joint mobilization, Stair training, Vestibular training, Canalith repositioning, Aquatic Therapy, Dry Needling, Cryotherapy, Moist heat, Taping, and Manual therapy  PLAN FOR NEXT SESSION: Canalith repositioning as indicated, balance, gaze habituation   Juel Burrow,  PT 11/27/2021, 8:40 AM   [facility] [address], [place], [place] [address] Phone # [phone] Fax [phone]

## 2021-12-01 ENCOUNTER — Encounter: Payer: Self-pay | Admitting: Rehabilitative and Restorative Service Providers"

## 2021-12-01 ENCOUNTER — Ambulatory Visit: Payer: Medicare Other | Admitting: Rehabilitative and Restorative Service Providers"

## 2021-12-01 DIAGNOSIS — M6281 Muscle weakness (generalized): Secondary | ICD-10-CM | POA: Diagnosis not present

## 2021-12-01 DIAGNOSIS — R42 Dizziness and giddiness: Secondary | ICD-10-CM

## 2021-12-01 DIAGNOSIS — R2689 Other abnormalities of gait and mobility: Secondary | ICD-10-CM

## 2021-12-01 DIAGNOSIS — R262 Difficulty in walking, not elsewhere classified: Secondary | ICD-10-CM | POA: Diagnosis not present

## 2021-12-01 NOTE — Therapy (Signed)
OUTPATIENT PHYSICAL THERAPY VESTIBULAR TREATMENT NOTE     Patient Name: Lori [name] MRN: [id number] DOB:1940-04-18, 81 y.o., female Today's Date: 12/01/2021  PCP: Shelva Majestic, MD REFERRING PROVIDER: Shelva Majestic, MD   PT End of Session - 12/01/21 1228     Visit Number 3    Date for PT Re-Evaluation 01/08/22    Authorization Type [employer] Medicare    Progress Note Due on Visit 10    PT Start Time 1225    PT Stop Time 1305    PT Time Calculation (min) 40 min    Activity Tolerance Patient tolerated treatment well    Behavior During Therapy WFL for tasks assessed/performed             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    Eczema    Family history of breast cancer    Family history of kidney cancer    Family history of multiple myeloma    Family history of thyroid cancer    GERD (gastroesophageal reflux disease)    Headache(784.0)    History of IBS    History of kidney stones    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    Hyperlipidemia    on medication   Hypertension    Low back pain    Meniere disease    Neuromuscular disorder (HCC)    raynaud's   Osteopenia    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HCC)    Stroke (HCC) 1988   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 carotid artery dissection    Tubular adenoma of colon 02/2013   Varicose veins with inflammation    upper and lower per pt    Vasculitis [facility])    Past Surgical History:  Procedure Laterality Date   arthroscopic knee  2009   left knee/ torn meniscus   BREAST LUMPECTOMY WITH  RADIOACTIVE SEED LOCALIZATION Right 11/05/2020   Procedure: RIGHT BREAST LUMPECTOMY WITH RADIOACTIVE SEED LOCALIZATION;  Surgeon: Harriette Bouillon, MD;  Location: [facility];  Service: General;  Laterality: Right;   BUNIONECTOMY Right 1998   with other foot surgery    carotid artery disection  1988   Carotid Artery Dissection   CATARACT EXTRACTION, BILATERAL  07-18-2017,08-08-2017   CESAREAN SECTION  1988   1 time   COLONOSCOPY  2019   last 2019   DILATION AND CURETTAGE OF UTERUS  2004   EYE SURGERY     EYE SURGERY  09/25/2021   lowere lid of left eye   POLYPECTOMY     POPLITEAL SYNOVIAL CYST EXCISION     left leg   TONSILLECTOMY  1957   UPPER GASTROINTESTINAL ENDOSCOPY     last 2018   Patient Active Problem List   Diagnosis Date Noted   Cerebral arterial aneurysm 11/02/2021   History of stroke 10/22/2021   Bilateral sensorineural hearing loss 09/14/2021  Imbalance 09/14/2021   Abnormal ear sensation, right 02/27/2021   Genetic testing 11/10/2020   Family history of kidney cancer 10/22/2020   Family history of thyroid cancer 10/22/2020   Family history of multiple myeloma 10/22/2020   Family history of breast cancer 10/22/2020   Malignant neoplasm of lower-inner quadrant of right breast of female, estrogen receptor positive (HCC) 10/17/2020   Parotid adenoma 06/26/2020   Post-nasal drainage 06/05/2020   Subjective tinnitus of both ears 06/05/2020   Vertigo 01/22/2020   IBS (irritable bowel syndrome) 06/26/2019   Osteoarthritis of left knee 09/15/2017   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   Anosmia 03/30/2017   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   Aortic atherosclerosis (HCC) 02/11/2016   Hypertension 11/29/2013   Eczema 11/29/2013   Hyperlipidemia 11/29/2013   Barrett's esophagus 08/24/2013   GERD (gastroesophageal reflux disease) 05/16/2013   Vitamin D deficiency 01/29/2012   Solitary pulmonary nodule 06/08/2011   HIATAL HERNIA WITH REFLUX 02/06/2010    DEGENERATIVE JOINT DISEASE, KNEE 03/14/2008   VARICOSE VEINS LOWER EXTREMITIES W/INFLAMMATION 03/09/2007   Allergic rhinitis 03/09/2007   ACTINIC KERATOSIS, FOREHEAD, LEFT 03/09/2007   Headache(784.0) 03/09/2007   MENIERE'S DISEASE 09/22/2006   RAYNAUD'S DISEASE 09/22/2006   Osteoporosis 09/22/2006    ONSET DATE: 10/22/2021   REFERRING DIAG: R42 (ICD-10-CM) - Vertigo   THERAPY DIAG:  Vertigo  Balance problem  Dizziness and giddiness  Muscle weakness (generalized)  Difficulty in walking, not elsewhere classified  SUBJECTIVE:   SUBJECTIVE STATEMENT: Pt reports a great improvements with dizziness and at least 80% improvements since she started having dizziness.  Stated last Epley maneuver helped a lot. Pt accompanied by: self  PERTINENT HISTORY: Osteopenia, borderline osteoporosis, hx LBP, Breast CA , Hx of knee surgery, varicose veins, COVID on 04/17/21, vertigo back in 12/2019, Mineare's Disease   PAIN:  Are you having pain? Yes: NPRS scale: 0-1/10 Pain location: bilateral knees Pain description: shooting Aggravating factors: walking Relieving factors: rest  PRECAUTIONS: Fall  WEIGHT BEARING RESTRICTIONS No  FALLS: Has patient fallen in last 6 months? No  LIVING ENVIRONMENT: Lives with: lives with their spouse Lives in: House/apartment Stairs: No Has following equipment at home: Single point cane, Environmental consultant - 2 wheeled, shower chair, and Grab bars  PLOF: Independent and Leisure: likes to walk with her friends in the park, walk her dogs, play with grandchildren  PATIENT GOALS:  To be able to get back to walking and driving.  OBJECTIVE:   DIAGNOSTIC FINDINGS: N/A  COGNITION: Overall cognitive status: Within functional limits for tasks assessed   POSTURE: rounded shoulders and forward head   Cervical ROM:    Active A/PROM (deg) 11/17/2021  Flexion 40  Extension 30  Right lateral flexion 30  Left lateral flexion 20  Right rotation 40  Left rotation 40   (Blank rows = not tested)  STRENGTH:  11/17/21:  B shoulder strength of 4 to 4+/5 throughout B hip strength of 4 to 4+/5 grossly throughout   BED MOBILITY:  WFL, but reports that she sometimes has dizziness when she lies flat  GAIT: Gait pattern: antalgic, poor foot clearance- Right, and poor foot clearance- Left Distance walked: 50 Assistive device utilized: Single point cane Level of assistance: SBA Comments: Pt with unsteady gait pattern  FUNCTIONAL TESTs:  5 times sit to stand: 15.2 sec Timed up and go (TUG): 12.7 sec without AD MCTSIB: Condition 1: Avg of 3 trials: 30 sec, Condition 2: Avg of 3 trials: 15 sec, Condition 3: Avg of 3 trials: 30  sec, Condition 4: Avg of 3 trials: 3 sec, and Total Score: 78/120  PATIENT SURVEYS:  8//2023: DHI   DHI Total Score: 38 / 100 Physical Score: 12 / 28 Emotional Score: 8 / 36 Functional Score: 18 / 36   VESTIBULAR ASSESSMENT   GENERAL OBSERVATION: Pt with unsteadiness of gait noted with slow gait velocity    SYMPTOM BEHAVIOR:   Subjective history: Pt has had a history of dizziness and Menire's Disease before, but states that this dizziness is different.   Non-Vestibular symptoms: changes in hearing and tinnitus   Type of dizziness: Imbalance (Disequilibrium)   Frequency: daily   Duration: throughout the day   Aggravating factors: Induced by position change: lying supine and supine to sit and Induced by motion: turning head quickly and driving   Relieving factors: head stationary   Progression of symptoms: unchanged   OCULOMOTOR EXAM:   Ocular Alignment: normal   Ocular ROM:  some decreased occular ROM noted   Spontaneous Nystagmus: absent   Gaze-Induced Nystagmus: absent   Smooth Pursuits: intact   Saccades: intact      VESTIBULAR - OCULAR REFLEX:    Slow VOR: Positive Bilaterally      POSITIONAL TESTING: Left Dix-Hallpike: nystagmus noted; Duration: less than 1 min   OTHOSTATICS: not done  FUNCTIONAL GAIT:     DYNAMIC GAIT INDEX 3-Normal, 2-Mild Impairment, 1-Moderate Impairments, 0-Severe Impairment  11/27/2021  LEVEL SURFACE 2  CHANGE IN GAIT SPEED 2  GAIT WITH HORIZONTAL HEAD TURNS 1  GAIT WITH VERTICAL HEAD TURNS 1  GAIT AND PIVOT TURN 2  STEP OVER OBSTACLE 1  STEP AROUND OBSTACLES 2  STEPS 2  TOTAL 13/24     VESTIBULAR TREATMENT:  12/01/2021: Cervical retraction 2x10 Scapular retraction 2x10 Ambulation outside for 7 min without assistive device and no instability noted Standing on blue foam x1 min, standing on blue foam performing horizontal and vertical head turns x1 min each Ambulation down PT gym hallway performing head rotation and head nods x2 laps each Standing with feet together performing ball toss x20 throws Standing with feet semi-tandem performing ball toss x20 throws with each foot leading Alt LE toe taps onto metal stool 2x10 bilat Tandem gait down length of barre and back with occasional UE assist needed x4 laps  11/27/2021: Gilberto Better, slight nystagmus bilaterally.  Performed Epley Maneuver x1 to bilateral side Standing on blue foam x1 min, standing on blue foam performing horizontal and vertical head turns x1 min each Dynamic Gait Index activities Cervical retraction 2x10 Scapular retraction 2x10   11/17/2021: Canalith Repositioning:   Epley Left: Number of Reps: 1, Response to Treatment: symptoms improved, and Comment: pt reports feeling less dizzy following one treatment of Epley    PATIENT EDUCATION: Education details: Issued HEP Person educated: Patient Education method: Explanation, Demonstration, and Handouts Education comprehension: verbalized understanding and returned demonstration  HOME EXERCISE PROGRAM: Access Code: [facility] URL: [email or web address] Date: 11/17/2021 Prepared by: Reather Laurence  Exercises - Brandt-Daroff Vestibular Exercise  - 1 x daily - 7 x weekly - 1 sets - 5 reps - Seated Gaze Stabilization with  Head Rotation  - 1 x daily - 7 x weekly - 2 sets - 30 reps - Seated Gaze Stabilization with Head Nod  - 1 x daily - 7 x weekly - 3 sets - 10 reps   GOALS: Goals reviewed with patient? Yes  SHORT TERM GOALS: Target date: 12/08/2021  Pt will be independent with initial HEP. Baseline: Goal status: MET  2.  Pt to report a 40% improvement in dizziness. Baseline:  Goal status: MET   LONG TERM GOALS: Target date: 01/08/2022  Pt will be independent with advanced HEP. Baseline:  Goal status: IN PROGRESS  2.  Pt will report at least a 75% improvement in dizziness symptoms since starting PT. Baseline:  Goal status: IN PROGRESS  3.  Pt will be able to go walking with her friends for at least 30 minutes without reports of increased dizziness or loss of balance. Baseline:  Goal status: INITIAL  4.  Pt will increase modified CTSIB to at least 100/120 to decrease her risk of falling Baseline: 78/120 Goal status: INITIAL  5.  Pt will report being able to return to driving without increased dizziness. Baseline: unable to drive Goal status: INITIAL  6.  Pt to increase bilat UE/LE strength to at least 4+/5 to allow her to perform functional activities in her home with increased ease.  Baseline:  4 to 4+/5  Goal status:  INITIAL   ASSESSMENT:  CLINICAL IMPRESSION: Ms Mino presents to skilled vestibular rehab reporting that following Epley maneuver last session, she noticed marked improvement in having less dizziness.  Pt reports that she is feeling much more stable and having less episodes of feeling dizzy or "off".  Pt continues to progress with exercises and was able to ambulate outside without any loss of balance.  Pt with some unsteadiness noted with tandem gait and required occasional UE support on barre.  Pt continues to require skilled PT to progress towards goal related activities.   OBJECTIVE IMPAIRMENTS Abnormal gait, decreased balance, difficulty walking, decreased  strength, dizziness, postural dysfunction, and pain.   ACTIVITY LIMITATIONS community activity and driving.   PERSONAL FACTORS Age and 3+ comorbidities: recent Covid-19, Menire's Disease, osteopenia  are also affecting patient's functional outcome.    REHAB POTENTIAL: Good  CLINICAL DECISION MAKING: Evolving/moderate complexity  EVALUATION COMPLEXITY: Moderate   PLAN: PT FREQUENCY: 1-2x/week  PT DURATION: 8 weeks  PLANNED INTERVENTIONS: Therapeutic exercises, Therapeutic activity, Neuromuscular re-education, Balance training, Gait training, Patient/Family education, Joint mobilization, Stair training, Vestibular training, Canalith repositioning, Aquatic Therapy, Dry Needling, Cryotherapy, Moist heat, Taping, and Manual therapy  PLAN FOR NEXT SESSION: Canalith repositioning as indicated, balance, gaze habituation   Jermani Pund, PT 12/01/2021, 1:16 PM   [facility] [address], [address] [place], [place] [address] Phone # [phone] Fax [phone]

## 2021-12-04 DIAGNOSIS — Z170 Estrogen receptor positive status [ER+]: Secondary | ICD-10-CM | POA: Diagnosis not present

## 2021-12-04 DIAGNOSIS — C50311 Malignant neoplasm of lower-inner quadrant of right female breast: Secondary | ICD-10-CM | POA: Diagnosis not present

## 2021-12-07 ENCOUNTER — Encounter: Payer: Self-pay | Admitting: Rehabilitative and Restorative Service Providers"

## 2021-12-07 ENCOUNTER — Ambulatory Visit: Payer: Medicare Other | Admitting: Rehabilitative and Restorative Service Providers"

## 2021-12-07 DIAGNOSIS — M6281 Muscle weakness (generalized): Secondary | ICD-10-CM

## 2021-12-07 DIAGNOSIS — R262 Difficulty in walking, not elsewhere classified: Secondary | ICD-10-CM

## 2021-12-07 DIAGNOSIS — R2689 Other abnormalities of gait and mobility: Secondary | ICD-10-CM | POA: Diagnosis not present

## 2021-12-07 DIAGNOSIS — R42 Dizziness and giddiness: Secondary | ICD-10-CM

## 2021-12-07 NOTE — Therapy (Signed)
OUTPATIENT PHYSICAL THERAPY VESTIBULAR TREATMENT NOTE     Patient Name: Lori [name] MRN: [id number] DOB:1941-03-12, 81 y.o., female Today's Date: 12/07/2021  PCP: [name], MD REFERRING PROVIDER: [name], MD   PT End of Session - 12/07/21 0931     Visit Number 4    Date for PT Re-Evaluation 01/08/22    Authorization Type [employer] Medicare    Progress Note Due on Visit 10    PT Start Time 0929    PT Stop Time 1008    PT Time Calculation (min) 39 min    Activity Tolerance Patient tolerated treatment well    Behavior During Therapy WFL for tasks assessed/performed             Past Medical History:  Diagnosis Date   Allergy    Anemia    past hx of anemia   Aneurysm ([place])    pseudo-aneurym of carotid arteries per pt   Anxiety    Aortic atherosclerosis (HCC)    Arthritis    knee- DJD    Barrett's esophagus    Breast cancer ([place])    right breast IDC   Burning mouth syndrome    Dr Redmond Baseman 11-2016 - no smell or taste x 4 yrs per pt    Cataract    bilateral    Clotting disorder ([place]) 1988   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    Eczema    Family history of breast cancer    Family history of kidney cancer    Family history of multiple myeloma    Family history of thyroid cancer    GERD (gastroesophageal reflux disease)    Headache(784.0)    History of IBS    History of kidney stones    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    Hyperlipidemia    on medication   Hypertension    Low back pain    Meniere disease    Neuromuscular disorder ([place])    raynaud's   Osteopenia    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[place])    Stroke ([place]) 1988   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 carotid artery dissection    Tubular adenoma of colon 02/2013   Varicose veins with inflammation    upper and lower per pt    Vasculitis [facility])    Past Surgical History:  Procedure Laterality Date   arthroscopic knee  2009   left knee/ torn meniscus   BREAST LUMPECTOMY WITH  RADIOACTIVE SEED LOCALIZATION Right 11/05/2020   Procedure: RIGHT BREAST LUMPECTOMY WITH RADIOACTIVE SEED LOCALIZATION;  Surgeon: Erroll Luna, MD;  Location: [facility];  Service: General;  Laterality: Right;   BUNIONECTOMY Right 1998   with other foot surgery    carotid artery disection  1988   Carotid Artery Dissection   CATARACT EXTRACTION, BILATERAL  07-18-2017,08-08-2017   CESAREAN SECTION  1988   1 time   COLONOSCOPY  2019   last 2019   [place] OF UTERUS  2004   EYE SURGERY     EYE SURGERY  09/25/2021   lowere lid of left eye   POLYPECTOMY     POPLITEAL SYNOVIAL CYST EXCISION     left leg   TONSILLECTOMY  1957   UPPER GASTROINTESTINAL ENDOSCOPY     last 2018   Patient Active Problem List   Diagnosis Date Noted   Cerebral arterial aneurysm 11/02/2021   History of stroke 10/22/2021   Bilateral sensorineural hearing loss 09/14/2021  Imbalance 09/14/2021   Abnormal ear sensation, right 02/27/2021   Genetic testing 11/10/2020   Family history of kidney cancer 10/22/2020   Family history of thyroid cancer 10/22/2020   Family history of multiple myeloma 10/22/2020   Family history of breast cancer 10/22/2020   Malignant neoplasm of lower-inner quadrant of right breast of female, estrogen receptor positive ([place]) 10/17/2020   Parotid adenoma 06/26/2020   Post-nasal drainage 06/05/2020   Subjective tinnitus of both ears 06/05/2020   Vertigo 01/22/2020   IBS (irritable bowel syndrome) 06/26/2019   Osteoarthritis of left knee 09/15/2017   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   Anosmia 03/30/2017   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   Aortic atherosclerosis ([place]) 02/11/2016   Hypertension 11/29/2013   Eczema 11/29/2013   Hyperlipidemia 11/29/2013   Barrett's esophagus 08/24/2013   GERD (gastroesophageal reflux disease) 05/16/2013   Vitamin D deficiency 01/29/2012   Solitary pulmonary nodule 06/08/2011   HIATAL HERNIA WITH REFLUX 02/06/2010    DEGENERATIVE JOINT DISEASE, KNEE 03/14/2008   VARICOSE VEINS LOWER EXTREMITIES W/INFLAMMATION 03/09/2007   Allergic rhinitis 03/09/2007   ACTINIC KERATOSIS, FOREHEAD, LEFT 03/09/2007   Headache(784.0) 03/09/2007   MENIERE'S DISEASE 09/22/2006   RAYNAUD'S DISEASE 09/22/2006   Osteoporosis 09/22/2006    ONSET DATE: 10/22/2021   REFERRING DIAG: R42 (ICD-10-CM) - Vertigo   THERAPY DIAG:  Vertigo  Balance problem  Dizziness and giddiness  Muscle weakness (generalized)  Difficulty in walking, not elsewhere classified  SUBJECTIVE:   SUBJECTIVE STATEMENT: Pt reports a great improvements with dizziness and that she was able to start walking with her friends again.  Pt does state some eye muscle fatigue with performing oculomotor tracking exercises.  PERTINENT HISTORY: Osteopenia, borderline osteoporosis, hx LBP, Breast CA , Hx of knee surgery, varicose veins, COVID on 04/17/21, vertigo back in 12/2019, Mineare's Disease   PAIN:  Are you having pain? Yes: NPRS scale: 0-1/10 Pain location: bilateral knees Pain description: shooting Aggravating factors: walking Relieving factors: rest  PRECAUTIONS: Fall  WEIGHT BEARING RESTRICTIONS No  FALLS: Has patient fallen in last 6 months? No  LIVING ENVIRONMENT: Lives with: lives with their spouse Lives in: House/apartment Stairs: No Has following equipment at home: Single point cane, Environmental consultant - 2 wheeled, shower chair, and Grab bars  PLOF: Independent and Leisure: likes to walk with her friends in the park, walk her dogs, play with grandchildren  PATIENT GOALS:  To be able to get back to walking and driving.  OBJECTIVE:   DIAGNOSTIC FINDINGS: N/A  COGNITION: Overall cognitive status: Within functional limits for tasks assessed   POSTURE: rounded shoulders and forward head   Cervical ROM:    Active A/PROM (deg) 11/17/2021  Flexion 40  Extension 30  Right lateral flexion 30  Left lateral flexion 20  Right rotation 40   Left rotation 40  (Blank rows = not tested)  STRENGTH:  11/17/21:  B shoulder strength of 4 to 4+/5 throughout B hip strength of 4 to 4+/5 grossly throughout   BED MOBILITY:  WFL, but reports that she sometimes has dizziness when she lies flat  GAIT: Gait pattern: antalgic, poor foot clearance- Right, and poor foot clearance- Left Distance walked: 50 Assistive device utilized: Single point cane Level of assistance: SBA Comments: Pt with unsteady gait pattern  FUNCTIONAL TESTs:  5 times sit to stand: 15.2 sec Timed up and go (TUG): 12.7 sec without AD MCTSIB: Condition 1: Avg of 3 trials: 30 sec, Condition 2: Avg of 3 trials: 15 sec, Condition 3: Avg of  3 trials: 30 sec, Condition 4: Avg of 3 trials: 3 sec, and Total Score: 78/120  PATIENT SURVEYS:  8//2023: DHI   DHI Total Score: 38 / 100 Physical Score: 12 / 28 Emotional Score: 8 / 36 Functional Score: 18 / 36   VESTIBULAR ASSESSMENT   GENERAL OBSERVATION: Pt with unsteadiness of gait noted with slow gait velocity    SYMPTOM BEHAVIOR:   Subjective history: Pt has had a history of dizziness and Menire's Disease before, but states that this dizziness is different.   Non-Vestibular symptoms: changes in hearing and tinnitus   Type of dizziness: Imbalance (Disequilibrium)   Frequency: daily   Duration: throughout the day   Aggravating factors: Induced by position change: lying supine and supine to sit and Induced by motion: turning head quickly and driving   Relieving factors: head stationary   Progression of symptoms: unchanged   OCULOMOTOR EXAM:   Ocular Alignment: normal   Ocular ROM:  some decreased occular ROM noted   Spontaneous Nystagmus: absent   Gaze-Induced Nystagmus: absent   Smooth Pursuits: intact   Saccades: intact      VESTIBULAR - OCULAR REFLEX:    Slow VOR: Positive Bilaterally      POSITIONAL TESTING: Left Dix-Hallpike: nystagmus noted; Duration: less than 1 min   OTHOSTATICS: not  done  FUNCTIONAL GAIT:    DYNAMIC GAIT INDEX 3-Normal, 2-Mild Impairment, 1-Moderate Impairments, 0-Severe Impairment  11/27/2021  LEVEL SURFACE 2  CHANGE IN GAIT SPEED 2  GAIT WITH HORIZONTAL HEAD TURNS 1  GAIT WITH VERTICAL HEAD TURNS 1  GAIT AND PIVOT TURN 2  STEP OVER OBSTACLE 1  STEP AROUND OBSTACLES 2  STEPS 2  TOTAL 13/24     VESTIBULAR TREATMENT:  12/07/2021: Nustep level 5 x8 min with PT present to discuss status Standing on blue foam x1 min, standing on blue foam performing horizontal and vertical head turns x1 min each Standing on blue foam performing ball toss x20 Standing on trampoline:  high marching, side/side weight shift, front/back weight shift each leg leading.  X1 min each Sitting on green pball:  small bouncing x20, CW and CCW pelvic clocks x15 each, marching and LAQ 2x10 bilat Side stepping over 3 small hurdles 3 laps bilat FWD/Back toe tap over small hurdle standing on one sound limb 2x10 bilat   12/01/2021: Cervical retraction 2x10 Scapular retraction 2x10 Ambulation outside for 7 min without assistive device and no instability noted Standing on blue foam x1 min, standing on blue foam performing horizontal and vertical head turns x1 min each Ambulation down PT gym hallway performing head rotation and head nods x2 laps each Standing with feet together performing ball toss x20 throws Standing with feet semi-tandem performing ball toss x20 throws with each foot leading Alt LE toe taps onto metal stool 2x10 bilat Tandem gait down length of barre and back with occasional UE assist needed x4 laps  11/27/2021: Marye Round, slight nystagmus bilaterally.  Performed Epley Maneuver x1 to bilateral side Standing on blue foam x1 min, standing on blue foam performing horizontal and vertical head turns x1 min each Dynamic Gait Index activities Cervical retraction 2x10 Scapular retraction 2x10    PATIENT EDUCATION: Education details: Issued HEP Person  educated: Patient Education method: Explanation, Demonstration, and Handouts Education comprehension: verbalized understanding and returned demonstration  HOME EXERCISE PROGRAM: Access Code: [facility] URL: [email or web address] Date: 11/17/2021 Prepared by: Juel Burrow  Exercises - Brandt-Daroff Vestibular Exercise  - 1 x daily - 7 x weekly - 1  sets - 5 reps - Seated Gaze Stabilization with Head Rotation  - 1 x daily - 7 x weekly - 2 sets - 30 reps - Seated Gaze Stabilization with Head Nod  - 1 x daily - 7 x weekly - 3 sets - 10 reps   GOALS: Goals reviewed with patient? Yes  SHORT TERM GOALS: Target date: 12/08/2021  Pt will be independent with initial HEP. Baseline: Goal status: MET  2.  Pt to report a 40% improvement in dizziness. Baseline:  Goal status: MET   LONG TERM GOALS: Target date: 01/08/2022  Pt will be independent with advanced HEP. Baseline:  Goal status: IN PROGRESS  2.  Pt will report at least a 75% improvement in dizziness symptoms since starting PT. Baseline:  Goal status: IN PROGRESS  3.  Pt will be able to go walking with her friends for at least 30 minutes without reports of increased dizziness or loss of balance. Baseline:  Goal status: INITIAL  4.  Pt will increase modified CTSIB to at least 100/120 to decrease her risk of falling Baseline: 78/120 Goal status: INITIAL  5.  Pt will report being able to return to driving without increased dizziness. Baseline: unable to drive Goal status: INITIAL  6.  Pt to increase bilat UE/LE strength to at least 4+/5 to allow her to perform functional activities in her home with increased ease.  Baseline:  4 to 4+/5  Goal status:  INITIAL   ASSESSMENT:  CLINICAL IMPRESSION: Ms Molock presents to skilled vestibular rehab with continued reports of decreased overall dizziness. Pt has been able to start walking in her neighborhood with her friends again, but has not reintegrated into  walking her dog yet.  Pt with improved balance noted during exercise session with occasional UE support needed.  Pt continues to require skilled PT to progress towards goal related activities.   OBJECTIVE IMPAIRMENTS Abnormal gait, decreased balance, difficulty walking, decreased strength, dizziness, postural dysfunction, and pain.   ACTIVITY LIMITATIONS community activity and driving.   PERSONAL FACTORS Age and 3+ comorbidities: recent Covid-19, Menire's Disease, osteopenia  are also affecting patient's functional outcome.    REHAB POTENTIAL: Good  CLINICAL DECISION MAKING: Evolving/moderate complexity  EVALUATION COMPLEXITY: Moderate   PLAN: PT FREQUENCY: 1-2x/week  PT DURATION: 8 weeks  PLANNED INTERVENTIONS: Therapeutic exercises, Therapeutic activity, Neuromuscular re-education, Balance training, Gait training, Patient/Family education, Joint mobilization, Stair training, Vestibular training, Canalith repositioning, Aquatic Therapy, Dry Needling, Cryotherapy, Moist heat, Taping, and Manual therapy  PLAN FOR NEXT SESSION: Canalith repositioning as indicated, balance, gaze habituation   Juel Burrow, PT 12/07/2021, 11:00 AM   [place], [place],  [address] Phone # [phone] Fax [phone]

## 2021-12-08 ENCOUNTER — Telehealth: Payer: Self-pay | Admitting: Pharmacist

## 2021-12-08 DIAGNOSIS — H02055 Trichiasis without entropian left lower eyelid: Secondary | ICD-10-CM | POA: Diagnosis not present

## 2021-12-08 DIAGNOSIS — H02054 Trichiasis without entropian left upper eyelid: Secondary | ICD-10-CM | POA: Diagnosis not present

## 2021-12-08 NOTE — Progress Notes (Signed)
Chronic Care Management Pharmacy Assistant   Name: Lori [name]  MRN: [id number] DOB: 1940-09-12   Reason for Encounter: Hypertension Adherence Call    Recent office visits:  11/25/2021 OV (PCP) [name], MD; no medication changes noted.  11/02/2021 OV (Fam Med) [name], MD; Start tessalon/benzonatate and claritin for your cough.  10/22/2021 OV (PCP) [name], MD; Meclizine has made her feel tired in the past- will d/c.  06/15/2021 OV (Fam Med) [name], MD; no medication changes noted.  Recent consult visits:  12/04/2021 OV (Gen Surgery) Cornett, Beryle Lathe, MD; no medication changes indicated.  09/14/2021 IC (Otolaryngology) [name], MD; no medication changes indicated.  08/19/2021 OV (Oncology) Nicholas Lose, MD; I recommended that she start bisphosphonate therapy and we discussed different options and because of her history of Barrett's esophagus, I recommended injection Prolia every 6 months.  Hospital visits:  None in previous 6 months  Medications: Outpatient Encounter Medications as of 12/08/2021  Medication Sig Note   amLODipine (NORVASC) 2.5 MG tablet TAKE 1 TABLET BY MOUTH EVERY DAY    aspirin 81 MG tablet Take 81 mg by mouth daily. Evening    bacitracin-polymyxin b (POLYSPORIN) ophthalmic ointment     cyanocobalamin 1000 MCG tablet Take 1,000 mcg by mouth once a week. 06/12/2021: Only taking once weekly!   letrozole (FEMARA) 2.5 MG tablet TAKE 1 TABLET BY MOUTH EVERY DAY    loratadine (CLARITIN) 10 MG tablet Take 10 mg by mouth daily. Take 1/2 tablet daily 06/26/2020: Taking 1/2 tablet daily   metoprolol tartrate (LOPRESSOR) 50 MG tablet TAKE 1/2 TABLET BY MOUTH 2 TIMES DAILY    montelukast (SINGULAIR) 10 MG tablet TAKE 1 TABLET BY MOUTH AT BEDTIME    ondansetron (ZOFRAN-ODT) 4 MG disintegrating tablet Take 1 tablet (4 mg total) by mouth every 8 (eight) hours as needed for nausea or vomiting.    pantoprazole  (PROTONIX) 40 MG tablet TAKE 1 TABLET BY MOUTH EVERY DAY    rosuvastatin (CRESTOR) 10 MG tablet Take 1 tablet (10 mg total) by mouth once a week.    No facility-administered encounter medications on file as of 12/08/2021.   Reviewed chart prior to disease state call. Spoke with patient regarding BP  Recent Office Vitals: BP Readings from Last 3 Encounters:  11/25/21 120/70  11/02/21 126/78  10/22/21 130/70   Pulse Readings from Last 3 Encounters:  11/25/21 65  11/02/21 73  10/22/21 80    Wt Readings from Last 3 Encounters:  11/25/21 115 lb 12.8 oz (52.5 kg)  11/02/21 116 lb 12.8 oz (53 kg)  10/22/21 114 lb 12.8 oz (52.1 kg)     Kidney Function Lab Results  Component Value Date/Time   CREATININE 0.65 09/08/2021 09:57 AM   CREATININE 0.69 05/20/2021 11:40 AM   CREATININE 0.60 01/02/2021 11:31 AM   CREATININE 0.75 10/22/2020 08:16 AM   CREATININE 0.66 12/27/2019 11:41 AM   CREATININE 0.73 10/08/2016 04:08 PM   GFR 82.67 05/20/2021 11:40 AM   GFRNONAA >60 09/08/2021 09:57 AM   GFRNONAA 85 12/27/2019 11:41 AM   GFRAA 99 12/27/2019 11:41 AM       Latest Ref Rng & Units 09/08/2021    9:57 AM 05/20/2021   11:40 AM 01/02/2021   11:31 AM  BMP  Glucose 70 - 99 mg/dL 111  63  77   BUN 8 - 23 mg/dL '25  24  22   '$ Creatinine 0.44 - 1.00 mg/dL 0.65  0.69  0.60   Sodium 135 - 145 mmol/L 139  139  137   Potassium 3.5 - 5.1 mmol/L 3.9  4.6  3.8   Chloride 98 - 111 mmol/L 105  103  102   CO2 22 - 32 mmol/L '27  27  26   '$ Calcium 8.9 - 10.3 mg/dL 10.0  10.0  9.9     Current antihypertensive regimen:  Amlodipine 2.5 mg daily Metoprolol 25 mg twice daily  How often are you checking your Blood Pressure? 3-5x per week  Current home BP readings: 120/70  What recent interventions/DTPs have been made by any provider to improve Blood Pressure control since last CPP Visit: No recent interventions or DTPs.  Any recent hospitalizations or ED visits since last visit with CPP? No  What diet  changes have been made to improve Blood Pressure Control?  No recent diet changes per patient.  What exercise is being done to improve your Blood Pressure Control?  Patient states she likes to walk.  Adherence Review: Is the patient currently on ACE/ARB medication? No Does the patient have >5 day gap between last estimated fill dates? No   Care Gaps: Medicare Annual Wellness: Completed 10/08/2021 Hemoglobin A1C: 6.2% on 05/20/2021 Colonoscopy: Completed 05/03/2018 Dexa Scan: Completed Mammogram: Completed on 02/18/2021  Future Appointments  Date Time Provider [place]  12/14/2021  9:30 AM Menke, [place], PT [facility] None  12/21/2021  9:30 AM Menke, Shaunda, PT [facility] None  12/28/2021  9:30 AM Menke, Shaunda, PT [facility] None  01/05/2022  9:30 AM Menke, Shaunda, PT [facility] None  01/06/2022  9:20 AM [name], MD [facility]EC  03/05/2022 10:00 AM [facility] None  03/05/2022 10:45 AM [facility] [place] FLUSH [facility] None  06/21/2022  3:30 PM [facility] PHARMACIST [facility]  08/23/2022  9:30 AM [facility] LAB [facility] None  08/23/2022 10:00 AM Nicholas Lose, MD [facility] None  08/23/2022 10:45 AM [facility] [place] FLUSH [facility] None  10/14/2022  2:45 PM [facility] HEALTH COACH [facility]   Star Rating Drugs: Rosuvastatin 10 mg last filled 11/12/2021 90 DS  April D Calhoun, [place] Pharmacist Assistant [phone]

## 2021-12-14 ENCOUNTER — Encounter: Payer: Self-pay | Admitting: Rehabilitative and Restorative Service Providers"

## 2021-12-14 ENCOUNTER — Ambulatory Visit: Payer: Medicare Other | Admitting: Rehabilitative and Restorative Service Providers"

## 2021-12-14 DIAGNOSIS — R2689 Other abnormalities of gait and mobility: Secondary | ICD-10-CM

## 2021-12-14 DIAGNOSIS — R262 Difficulty in walking, not elsewhere classified: Secondary | ICD-10-CM | POA: Diagnosis not present

## 2021-12-14 DIAGNOSIS — R42 Dizziness and giddiness: Secondary | ICD-10-CM | POA: Diagnosis not present

## 2021-12-14 DIAGNOSIS — M6281 Muscle weakness (generalized): Secondary | ICD-10-CM

## 2021-12-14 NOTE — Therapy (Signed)
OUTPATIENT PHYSICAL THERAPY VESTIBULAR TREATMENT NOTE     Patient Name: Lori [name] MRN: [id number] DOB:1940-05-17, 81 y.o., female Today's Date: 12/14/2021  PCP: Shelva Majestic, MD REFERRING PROVIDER: Shelva Majestic, MD   PT End of Session - 12/14/21 0932     Visit Number 5    Date for PT Re-Evaluation 01/08/22    Authorization Type [employer] Medicare    Progress Note Due on Visit 10    PT Start Time 0929    PT Stop Time 1008    PT Time Calculation (min) 39 min    Activity Tolerance Patient tolerated treatment well    Behavior During Therapy WFL for tasks assessed/performed             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    Eczema    Family history of breast cancer    Family history of kidney cancer    Family history of multiple myeloma    Family history of thyroid cancer    GERD (gastroesophageal reflux disease)    Headache(784.0)    History of IBS    History of kidney stones    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    Hyperlipidemia    on medication   Hypertension    Low back pain    Meniere disease    Neuromuscular disorder (HCC)    raynaud's   Osteopenia    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HCC)    Stroke (HCC) 1988   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 carotid artery dissection    Tubular adenoma of colon 02/2013   Varicose veins with inflammation    upper and lower per pt    Vasculitis [facility])    Past Surgical History:  Procedure Laterality Date   arthroscopic knee  2009   left knee/ torn meniscus   BREAST LUMPECTOMY WITH  RADIOACTIVE SEED LOCALIZATION Right 11/05/2020   Procedure: RIGHT BREAST LUMPECTOMY WITH RADIOACTIVE SEED LOCALIZATION;  Surgeon: Harriette Bouillon, MD;  Location: [facility];  Service: General;  Laterality: Right;   BUNIONECTOMY Right 1998   with other foot surgery    carotid artery disection  1988   Carotid Artery Dissection   CATARACT EXTRACTION, BILATERAL  07-18-2017,08-08-2017   CESAREAN SECTION  1988   1 time   COLONOSCOPY  2019   last 2019   DILATION AND CURETTAGE OF UTERUS  2004   EYE SURGERY     EYE SURGERY  09/25/2021   lowere lid of left eye   POLYPECTOMY     POPLITEAL SYNOVIAL CYST EXCISION     left leg   TONSILLECTOMY  1957   UPPER GASTROINTESTINAL ENDOSCOPY     last 2018   Patient Active Problem List   Diagnosis Date Noted   Cerebral arterial aneurysm 11/02/2021   History of stroke 10/22/2021   Bilateral sensorineural hearing loss 09/14/2021  Imbalance 09/14/2021   Abnormal ear sensation, right 02/27/2021   Genetic testing 11/10/2020   Family history of kidney cancer 10/22/2020   Family history of thyroid cancer 10/22/2020   Family history of multiple myeloma 10/22/2020   Family history of breast cancer 10/22/2020   Malignant neoplasm of lower-inner quadrant of right breast of female, estrogen receptor positive ([place]) 10/17/2020   Parotid adenoma 06/26/2020   Post-nasal drainage 06/05/2020   Subjective tinnitus of both ears 06/05/2020   Vertigo 01/22/2020   IBS (irritable bowel syndrome) 06/26/2019   Osteoarthritis of left knee 09/15/2017   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   Anosmia 03/30/2017   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   Aortic atherosclerosis ([place]) 02/11/2016   Hypertension 11/29/2013   Eczema 11/29/2013   Hyperlipidemia 11/29/2013   Barrett's esophagus 08/24/2013   GERD (gastroesophageal reflux disease) 05/16/2013   Vitamin D deficiency 01/29/2012   Solitary pulmonary nodule 06/08/2011   HIATAL HERNIA WITH REFLUX 02/06/2010    DEGENERATIVE JOINT DISEASE, KNEE 03/14/2008   VARICOSE VEINS LOWER EXTREMITIES W/INFLAMMATION 03/09/2007   Allergic rhinitis 03/09/2007   ACTINIC KERATOSIS, FOREHEAD, LEFT 03/09/2007   Headache(784.0) 03/09/2007   MENIERE'S DISEASE 09/22/2006   RAYNAUD'S DISEASE 09/22/2006   Osteoporosis 09/22/2006    ONSET DATE: 10/22/2021   REFERRING DIAG: R42 (ICD-10-CM) - Vertigo   THERAPY DIAG:  Vertigo  Balance problem  Dizziness and giddiness  Muscle weakness (generalized)  Difficulty in walking, not elsewhere classified  SUBJECTIVE:   SUBJECTIVE STATEMENT: Pt reports continued relief of dizziness and has not had any recent dizziness.  Pt is reporting increased knee pain today.  Pt states that she has started doing some walks with her friends, but has not returned to the normal walk yet.  PERTINENT HISTORY: Osteopenia, borderline osteoporosis, hx LBP, Breast CA , Hx of knee surgery, varicose veins, COVID on 04/17/21, vertigo back in 12/2019, Mineare's Disease   PAIN:  Are you having pain? Yes: NPRS scale: 7/10 Pain location: bilateral knees Pain description: shooting Aggravating factors: walking Relieving factors: rest  PRECAUTIONS: Fall  WEIGHT BEARING RESTRICTIONS No  FALLS: Has patient fallen in last 6 months? No  LIVING ENVIRONMENT: Lives with: lives with their spouse Lives in: House/apartment Stairs: No Has following equipment at home: Single point cane, Environmental consultant - 2 wheeled, shower chair, and Grab bars  PLOF: Independent and Leisure: likes to walk with her friends in the park, walk her dogs, play with grandchildren  PATIENT GOALS:  To be able to get back to walking and driving.  OBJECTIVE:   DIAGNOSTIC FINDINGS: N/A  COGNITION: Overall cognitive status: Within functional limits for tasks assessed   POSTURE: rounded shoulders and forward head   Cervical ROM:    Active A/PROM (deg) 11/17/2021  Flexion 40  Extension 30  Right lateral flexion 30  Left  lateral flexion 20  Right rotation 40  Left rotation 40  (Blank rows = not tested)  STRENGTH:  11/17/21:  B shoulder strength of 4 to 4+/5 throughout B hip strength of 4 to 4+/5 grossly throughout   BED MOBILITY:  WFL, but reports that she sometimes has dizziness when she lies flat  GAIT: Gait pattern: antalgic, poor foot clearance- Right, and poor foot clearance- Left Distance walked: 50 Assistive device utilized: Single point cane Level of assistance: SBA Comments: Pt with unsteady gait pattern  FUNCTIONAL TESTs:  11/17/2021: 5 times sit to stand: 15.2 sec Timed up and go (TUG): 12.7 sec without AD MCTSIB: Condition 1: Avg of 3 trials: 30 sec,  Condition 2: Avg of 3 trials: 15 sec, Condition 3: Avg of 3 trials: 30 sec, Condition 4: Avg of 3 trials: 3 sec, and Total Score: 78/120  PATIENT SURVEYS:  11/17/2021: DHI 38 DHI Total Score: 38 / 100 Physical Score: 12 / 28 Emotional Score: 8 / 36 Functional Score: 18 / 36   12/14/2021: DHI Total Score: 0 / 100 Physical Score: 0 / 28 Emotional Score: 0 / 36 Functional Score: 0 / 36   VESTIBULAR ASSESSMENT   GENERAL OBSERVATION: Pt with unsteadiness of gait noted with slow gait velocity    SYMPTOM BEHAVIOR:   Subjective history: Pt has had a history of dizziness and Menire's Disease before, but states that this dizziness is different.   Non-Vestibular symptoms: changes in hearing and tinnitus   Type of dizziness: Imbalance (Disequilibrium)   Frequency: daily   Duration: throughout the day   Aggravating factors: Induced by position change: lying supine and supine to sit and Induced by motion: turning head quickly and driving   Relieving factors: head stationary   Progression of symptoms: unchanged   OCULOMOTOR EXAM:   Ocular Alignment: normal   Ocular ROM:  some decreased occular ROM noted   Spontaneous Nystagmus: absent   Gaze-Induced Nystagmus: absent   Smooth Pursuits: intact   Saccades: intact      VESTIBULAR -  OCULAR REFLEX:    Slow VOR: Positive Bilaterally      POSITIONAL TESTING: Left Dix-Hallpike: nystagmus noted; Duration: less than 1 min   OTHOSTATICS: not done  FUNCTIONAL GAIT:    DYNAMIC GAIT INDEX 3-Normal, 2-Mild Impairment, 1-Moderate Impairments, 0-Severe Impairment  11/27/2021  LEVEL SURFACE 2  CHANGE IN GAIT SPEED 2  GAIT WITH HORIZONTAL HEAD TURNS 1  GAIT WITH VERTICAL HEAD TURNS 1  GAIT AND PIVOT TURN 2  STEP OVER OBSTACLE 1  STEP AROUND OBSTACLES 2  STEPS 2  TOTAL 13/24     VESTIBULAR TREATMENT:  12/14/2021: Nustep level 5 x8 min with PT present to discuss status Sit to/from stand with blue foam holding 3#:  x10 with chest press, x10 with overhead press Standing on blue foam performing shoulder ER and shoulder horizontal abduction with red tband 2x10 each bilat Standing on blue foam performing marching 2x10 Walking down hallway performing head nods and head rotations 2x15' each Tandem gait down barre and back x3 laps down and back   12/07/2021: Nustep level 5 x8 min with PT present to discuss status Standing on blue foam x1 min, standing on blue foam performing horizontal and vertical head turns x1 min each Standing on blue foam performing ball toss x20 Standing on trampoline:  high marching, side/side weight shift, front/back weight shift each leg leading.  X1 min each Sitting on green pball:  small bouncing x20, CW and CCW pelvic clocks x15 each, marching and LAQ 2x10 bilat Side stepping over 3 small hurdles 3 laps bilat FWD/Back toe tap over small hurdle standing on one sound limb 2x10 bilat   12/01/2021: Cervical retraction 2x10 Scapular retraction 2x10 Ambulation outside for 7 min without assistive device and no instability noted Standing on blue foam x1 min, standing on blue foam performing horizontal and vertical head turns x1 min each Ambulation down PT gym hallway performing head rotation and head nods x2 laps each Standing with feet together  performing ball toss x20 throws Standing with feet semi-tandem performing ball toss x20 throws with each foot leading Alt LE toe taps onto metal stool 2x10 bilat Tandem gait down length  of barre and back with occasional UE assist needed x4 laps     PATIENT EDUCATION: Education details: Issued HEP Person educated: Patient Education method: Explanation, Demonstration, and Handouts Education comprehension: verbalized understanding and returned demonstration  HOME EXERCISE PROGRAM: Access Code: [facility] URL: [email or web address] Date: 12/14/2021 Prepared by: Juel Burrow  Exercises - Brandt-Daroff Vestibular Exercise  - 1 x daily - 7 x weekly - 1 sets - 5 reps - Seated Gaze Stabilization with Head Rotation  - 1 x daily - 7 x weekly - 2 sets - 30 reps - Seated Gaze Stabilization with Head Nod  - 1 x daily - 7 x weekly - 3 sets - 10 reps - Walking with Head Nod  - 1 x daily - 7 x weekly - 3 sets - 10 reps - Walking with Head Rotation  - 1 x daily - 7 x weekly - 3 sets - 10 reps - Sit to Stand on Foam Pad  - 1 x daily - 7 x weekly - 2 sets - 10 reps - Shoulder External Rotation and Scapular Retraction with Resistance  - 1 x daily - 7 x weekly - 2 sets - 10 reps - Standing Shoulder Horizontal Abduction with Resistance  - 1 x daily - 7 x weekly - 2 sets - 10 reps - Standing March with Counter Support  - 1 x daily - 7 x weekly - 2 sets - 10 reps   GOALS: Goals reviewed with patient? Yes  SHORT TERM GOALS: Target date: 12/08/2021  Pt will be independent with initial HEP. Baseline: Goal status: MET  2.  Pt to report a 40% improvement in dizziness. Baseline:  Goal status: MET   LONG TERM GOALS: Target date: 01/08/2022  Pt will be independent with advanced HEP. Baseline:  Goal status: IN PROGRESS  2.  Pt will report at least a 75% improvement in dizziness symptoms since starting PT. Baseline:  Goal status: IN PROGRESS  3.  Pt will be able to go walking with  her friends for at least 30 minutes without reports of increased dizziness or loss of balance. Baseline:  Goal status: IN PROGRESS  4.  Pt will increase modified CTSIB to at least 100/120 to decrease her risk of falling Baseline: 78/120 Goal status: IN PROGRESS  5.  Pt will report being able to return to driving without increased dizziness. Baseline: unable to drive Goal status: IN PROGRESS  6.  Pt to increase bilat UE/LE strength to at least 4+/5 to allow her to perform functional activities in her home with increased ease.  Baseline:  4 to 4+/5  Goal status:  INITIAL   ASSESSMENT:  CLINICAL IMPRESSION: Ms [name] presents to skilled rehab with continued reports of improved dizziness and balance.  Updated HEP today to incorporate strengthening and balance.  Pt able to progress through exercises with improved posture and balance noted.  Pt continues to have some instability noted with walking with head turns.  Pt continues to require skilled PT to progress towards goal related activities.   OBJECTIVE IMPAIRMENTS Abnormal gait, decreased balance, difficulty walking, decreased strength, dizziness, postural dysfunction, and pain.   ACTIVITY LIMITATIONS community activity and driving.   PERSONAL FACTORS Age and 3+ comorbidities: recent Covid-19, Menire's Disease, osteopenia  are also affecting patient's functional outcome.    REHAB POTENTIAL: Good  CLINICAL DECISION MAKING: Evolving/moderate complexity  EVALUATION COMPLEXITY: Moderate   PLAN: PT FREQUENCY: 1-2x/week  PT DURATION: 8 weeks  PLANNED INTERVENTIONS: Therapeutic exercises, Therapeutic activity, Neuromuscular  re-education, Balance training, Gait training, Patient/Family education, Joint mobilization, Stair training, Vestibular training, Canalith repositioning, Aquatic Therapy, Dry Needling, Cryotherapy, Moist heat, Taping, and Manual therapy  PLAN FOR NEXT SESSION: Canalith repositioning as indicated, balance, gaze  habituation   Juel Burrow, PT 12/14/2021, 10:17 AM   [facility] [address], [place] [address] [place], [place] [address] Phone # [phone] Fax [phone]

## 2021-12-21 ENCOUNTER — Encounter: Payer: Self-pay | Admitting: Rehabilitative and Restorative Service Providers"

## 2021-12-21 ENCOUNTER — Ambulatory Visit: Payer: Medicare Other | Attending: Family Medicine | Admitting: Rehabilitative and Restorative Service Providers"

## 2021-12-21 DIAGNOSIS — M6281 Muscle weakness (generalized): Secondary | ICD-10-CM | POA: Diagnosis not present

## 2021-12-21 DIAGNOSIS — R42 Dizziness and giddiness: Secondary | ICD-10-CM | POA: Diagnosis not present

## 2021-12-21 DIAGNOSIS — R262 Difficulty in walking, not elsewhere classified: Secondary | ICD-10-CM

## 2021-12-21 DIAGNOSIS — R2689 Other abnormalities of gait and mobility: Secondary | ICD-10-CM | POA: Diagnosis not present

## 2021-12-21 NOTE — Therapy (Signed)
OUTPATIENT PHYSICAL THERAPY VESTIBULAR TREATMENT NOTE     Patient Name: Lori [name] MRN: [id number] DOB:1940/11/23, 81 y.o., female Today's Date: 12/21/2021  PCP: [name], MD REFERRING PROVIDER: [name], MD   PT End of Session - 12/21/21 [phone]     Visit Number 6    Date for PT Re-Evaluation 01/08/22    Authorization Type [employer] Medicare    Progress Note Due on Visit 10    PT Start Time 0930    PT Stop Time 1010    PT Time Calculation (min) 40 min    Activity Tolerance Patient tolerated treatment well    Behavior During Therapy WFL for tasks assessed/performed             Past Medical History:  Diagnosis Date   Allergy    Anemia    past hx of anemia   Aneurysm ([place])    pseudo-aneurym of carotid arteries per pt   Anxiety    Aortic atherosclerosis (HCC)    Arthritis    knee- DJD    Barrett's esophagus    Breast cancer ([place])    right breast IDC   Burning mouth syndrome    Dr Redmond Baseman 11-2016 - no smell or taste x 4 yrs per pt    Cataract    bilateral    Clotting disorder ([place]) 1988   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    Eczema    Family history of breast cancer    Family history of kidney cancer    Family history of multiple myeloma    Family history of thyroid cancer    GERD (gastroesophageal reflux disease)    Headache(784.0)    History of IBS    History of kidney stones    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    Hyperlipidemia    on medication   Hypertension    Low back pain    Meniere disease    Neuromuscular disorder ([place])    raynaud's   Osteopenia    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[place])    Stroke ([place]) 1988   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 carotid artery dissection    Tubular adenoma of colon 02/2013   Varicose veins with inflammation    upper and lower per pt    Vasculitis [facility])    Past Surgical History:  Procedure Laterality Date   arthroscopic knee  2009   left knee/ torn meniscus   BREAST LUMPECTOMY WITH  RADIOACTIVE SEED LOCALIZATION Right 11/05/2020   Procedure: RIGHT BREAST LUMPECTOMY WITH RADIOACTIVE SEED LOCALIZATION;  Surgeon: Erroll Luna, MD;  Location: [facility];  Service: General;  Laterality: Right;   BUNIONECTOMY Right 1998   with other foot surgery    carotid artery disection  1988   Carotid Artery Dissection   CATARACT EXTRACTION, BILATERAL  07-18-2017,08-08-2017   CESAREAN SECTION  1988   1 time   COLONOSCOPY  2019   last 2019   [place] OF UTERUS  2004   EYE SURGERY     EYE SURGERY  09/25/2021   lowere lid of left eye   POLYPECTOMY     POPLITEAL SYNOVIAL CYST EXCISION     left leg   TONSILLECTOMY  1957   UPPER GASTROINTESTINAL ENDOSCOPY     last 2018   Patient Active Problem List   Diagnosis Date Noted   Cerebral arterial aneurysm 11/02/2021   History of stroke 10/22/2021   Bilateral sensorineural hearing loss 09/14/2021  Imbalance 09/14/2021   Abnormal ear sensation, right 02/27/2021   Genetic testing 11/10/2020   Family history of kidney cancer 10/22/2020   Family history of thyroid cancer 10/22/2020   Family history of multiple myeloma 10/22/2020   Family history of breast cancer 10/22/2020   Malignant neoplasm of lower-inner quadrant of right breast of female, estrogen receptor positive ([place]) 10/17/2020   Parotid adenoma 06/26/2020   Post-nasal drainage 06/05/2020   Subjective tinnitus of both ears 06/05/2020   Vertigo 01/22/2020   IBS (irritable bowel syndrome) 06/26/2019   Osteoarthritis of left knee 09/15/2017   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   Anosmia 03/30/2017   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   Aortic atherosclerosis ([place]) 02/11/2016   Hypertension 11/29/2013   Eczema 11/29/2013   Hyperlipidemia 11/29/2013   Barrett's esophagus 08/24/2013   GERD (gastroesophageal reflux disease) 05/16/2013   Vitamin D deficiency 01/29/2012   Solitary pulmonary nodule 06/08/2011   HIATAL HERNIA WITH REFLUX 02/06/2010    DEGENERATIVE JOINT DISEASE, KNEE 03/14/2008   VARICOSE VEINS LOWER EXTREMITIES W/INFLAMMATION 03/09/2007   Allergic rhinitis 03/09/2007   ACTINIC KERATOSIS, FOREHEAD, LEFT 03/09/2007   Headache(784.0) 03/09/2007   MENIERE'S DISEASE 09/22/2006   RAYNAUD'S DISEASE 09/22/2006   Osteoporosis 09/22/2006    ONSET DATE: 10/22/2021   REFERRING DIAG: R42 (ICD-10-CM) - Vertigo   THERAPY DIAG:  Vertigo  Balance problem  Dizziness and giddiness  Muscle weakness (generalized)  Difficulty in walking, not elsewhere classified  SUBJECTIVE:   SUBJECTIVE STATEMENT: Pt reports continued relief of dizziness and has not had any recent dizziness.  Pt is reporting increased knee pain today.  Pt states that she has started doing some walks with her friends, but has not returned to the normal walk yet.  PERTINENT HISTORY: Osteopenia, borderline osteoporosis, hx LBP, Breast CA , Hx of knee surgery, varicose veins, COVID on 04/17/21, vertigo back in 12/2019, Mineare's Disease   PAIN:  Are you having pain? Yes: NPRS scale: 5-7/10 Pain location: bilateral knees Pain description: shooting Aggravating factors: walking Relieving factors: rest  PRECAUTIONS: Fall  WEIGHT BEARING RESTRICTIONS No  FALLS: Has patient fallen in last 6 months? No  LIVING ENVIRONMENT: Lives with: lives with their spouse Lives in: House/apartment Stairs: No Has following equipment at home: Single point cane, Environmental consultant - 2 wheeled, shower chair, and Grab bars  PLOF: Independent and Leisure: likes to walk with her friends in the park, walk her dogs, play with grandchildren  PATIENT GOALS:  To be able to get back to walking and driving.  OBJECTIVE:   DIAGNOSTIC FINDINGS: N/A  COGNITION: Overall cognitive status: Within functional limits for tasks assessed   POSTURE: rounded shoulders and forward head   Cervical ROM:    Active A/PROM (deg) 11/17/2021  Flexion 40  Extension 30  Right lateral flexion 30  Left  lateral flexion 20  Right rotation 40  Left rotation 40  (Blank rows = not tested)  STRENGTH:  11/17/21:  B shoulder strength of 4 to 4+/5 throughout B hip strength of 4 to 4+/5 grossly throughout   BED MOBILITY:  WFL, but reports that she sometimes has dizziness when she lies flat  GAIT: Gait pattern: antalgic, poor foot clearance- Right, and poor foot clearance- Left Distance walked: 50 Assistive device utilized: Single point cane Level of assistance: SBA Comments: Pt with unsteady gait pattern  FUNCTIONAL TESTs:  11/17/2021: 5 times sit to stand: 15.2 sec Timed up and go (TUG): 12.7 sec without AD MCTSIB: Condition 1: Avg of 3 trials: 30 sec,  Condition 2: Avg of 3 trials: 15 sec, Condition 3: Avg of 3 trials: 30 sec, Condition 4: Avg of 3 trials: 3 sec, and Total Score: 78/120  PATIENT SURVEYS:  11/17/2021: DHI 38 DHI Total Score: 38 / 100 Physical Score: 12 / 28 Emotional Score: 8 / 36 Functional Score: 18 / 36   12/14/2021: DHI Total Score: 0 / 100 Physical Score: 0 / 28 Emotional Score: 0 / 36 Functional Score: 0 / 36   VESTIBULAR ASSESSMENT   GENERAL OBSERVATION: Pt with unsteadiness of gait noted with slow gait velocity    SYMPTOM BEHAVIOR:   Subjective history: Pt has had a history of dizziness and Menire's Disease before, but states that this dizziness is different.   Non-Vestibular symptoms: changes in hearing and tinnitus   Type of dizziness: Imbalance (Disequilibrium)   Frequency: daily   Duration: throughout the day   Aggravating factors: Induced by position change: lying supine and supine to sit and Induced by motion: turning head quickly and driving   Relieving factors: head stationary   Progression of symptoms: unchanged   OCULOMOTOR EXAM:   Ocular Alignment: normal   Ocular ROM:  some decreased occular ROM noted   Spontaneous Nystagmus: absent   Gaze-Induced Nystagmus: absent   Smooth Pursuits: intact   Saccades: intact      VESTIBULAR -  OCULAR REFLEX:    Slow VOR: Positive Bilaterally      POSITIONAL TESTING: Left Dix-Hallpike: nystagmus noted; Duration: less than 1 min   OTHOSTATICS: not done  FUNCTIONAL GAIT:    DYNAMIC GAIT INDEX 3-Normal, 2-Mild Impairment, 1-Moderate Impairments, 0-Severe Impairment  11/27/2021 12/21/2021  LEVEL SURFACE 2 3  CHANGE IN GAIT SPEED 2 2  GAIT WITH HORIZONTAL HEAD TURNS 1 2  GAIT WITH VERTICAL HEAD TURNS 1 2  GAIT AND PIVOT TURN 2 3  STEP OVER OBSTACLE 1 2  STEP AROUND OBSTACLES 2 3  STEPS 2 2  TOTAL 13/24 19/24     VESTIBULAR TREATMENT:  12/21/2021: Nustep level 5 x8 min with PT present to discuss status Sit to/from stand with blue foam holding 3#:  x10 with chest press, x10 with overhead press Standing on blue foam performing shoulder ER and shoulder horizontal abduction with red tband 2x10 each bilat Dynamic gait activities Standing on blue foam performing head nods, then head rotations.  X1 min each Standing on blue foam performing ball tosses and ball bounces.   12/14/2021: Nustep level 5 x8 min with PT present to discuss status Sit to/from stand with blue foam holding 3#:  x10 with chest press, x10 with overhead press Standing on blue foam performing shoulder ER and shoulder horizontal abduction with red tband 2x10 each bilat Standing on blue foam performing marching 2x10 Walking down hallway performing head nods and head rotations 2x15' each Tandem gait down barre and back x3 laps down and back   12/07/2021: Nustep level 5 x8 min with PT present to discuss status Standing on blue foam x1 min, standing on blue foam performing horizontal and vertical head turns x1 min each Standing on blue foam performing ball toss x20 Standing on trampoline:  high marching, side/side weight shift, front/back weight shift each leg leading.  X1 min each Sitting on green pball:  small bouncing x20, CW and CCW pelvic clocks x15 each, marching and LAQ 2x10 bilat Side stepping over 3  small hurdles 3 laps bilat FWD/Back toe tap over small hurdle standing on one sound limb 2x10 bilat    PATIENT  EDUCATION: Education details: Issued HEP Person educated: Patient Education method: Explanation, Media planner, and Handouts Education comprehension: verbalized understanding and returned demonstration  HOME EXERCISE PROGRAM: Access Code: [facility] URL: [email or web address] Date: 12/14/2021 Prepared by: Juel Burrow  Exercises - Brandt-Daroff Vestibular Exercise  - 1 x daily - 7 x weekly - 1 sets - 5 reps - Seated Gaze Stabilization with Head Rotation  - 1 x daily - 7 x weekly - 2 sets - 30 reps - Seated Gaze Stabilization with Head Nod  - 1 x daily - 7 x weekly - 3 sets - 10 reps - Walking with Head Nod  - 1 x daily - 7 x weekly - 3 sets - 10 reps - Walking with Head Rotation  - 1 x daily - 7 x weekly - 3 sets - 10 reps - Sit to Stand on Foam Pad  - 1 x daily - 7 x weekly - 2 sets - 10 reps - Shoulder External Rotation and Scapular Retraction with Resistance  - 1 x daily - 7 x weekly - 2 sets - 10 reps - Standing Shoulder Horizontal Abduction with Resistance  - 1 x daily - 7 x weekly - 2 sets - 10 reps - Standing March with Counter Support  - 1 x daily - 7 x weekly - 2 sets - 10 reps   GOALS: Goals reviewed with patient? Yes  SHORT TERM GOALS: Target date: 12/08/2021  Pt will be independent with initial HEP. Baseline: Goal status: MET  2.  Pt to report a 40% improvement in dizziness. Baseline:  Goal status: MET   LONG TERM GOALS: Target date: 01/08/2022  Pt will be independent with advanced HEP. Baseline:  Goal status: IN PROGRESS  2.  Pt will report at least a 75% improvement in dizziness symptoms since starting PT. Baseline:  Goal status: MET  3.  Pt will be able to go walking with her friends for at least 30 minutes without reports of increased dizziness or loss of balance. Baseline:  Goal status: IN PROGRESS  4.  Pt will increase  modified CTSIB to at least 100/120 to decrease her risk of falling Baseline: 78/120 Goal status: IN PROGRESS  5.  Pt will report being able to return to driving without increased dizziness. Baseline: unable to drive Goal status: IN PROGRESS  6.  Pt to increase bilat UE/LE strength to at least 4+/5 to allow her to perform functional activities in her home with increased ease.  Baseline:  4 to 4+/5  Goal status:  INITIAL   ASSESSMENT:  CLINICAL IMPRESSION: Ms Egner presents to skilled rehab with continued reports of improvements with vestibular symptoms.  Pt states that she has not had any major dizziness since the 2nd Epley maneuver.  Pt reports that dizziness is 100% improved.  Pt does continue to report some balance impairments, but states that she is at least 80% better since initial evaluation.  Pt states that she is able to walk her dog again and has been able to been able to start driving short distances again.  Pt continues to require skilled PT to progress towards goal related activities.   OBJECTIVE IMPAIRMENTS Abnormal gait, decreased balance, difficulty walking, decreased strength, dizziness, postural dysfunction, and pain.   ACTIVITY LIMITATIONS community activity and driving.   PERSONAL FACTORS Age and 3+ comorbidities: recent Covid-19, Menire's Disease, osteopenia  are also affecting patient's functional outcome.    REHAB POTENTIAL: Good  CLINICAL DECISION MAKING: Evolving/moderate complexity  EVALUATION COMPLEXITY: Moderate  PLAN: PT FREQUENCY: 1-2x/week  PT DURATION: 8 weeks  PLANNED INTERVENTIONS: Therapeutic exercises, Therapeutic activity, Neuromuscular re-education, Balance training, Gait training, Patient/Family education, Joint mobilization, Stair training, Vestibular training, Canalith repositioning, Aquatic Therapy, Dry Needling, Cryotherapy, Moist heat, Taping, and Manual therapy  PLAN FOR NEXT SESSION: Canalith repositioning as indicated, balance,  gaze habituation   Juel Burrow, PT 12/21/2021, 10:19 AM   [facility] [address], [place] [address] [place], [place] [address] Phone # [phone] Fax [phone]

## 2021-12-28 ENCOUNTER — Encounter: Payer: Self-pay | Admitting: Physician Assistant

## 2021-12-28 ENCOUNTER — Ambulatory Visit: Payer: Medicare Other | Admitting: Rehabilitative and Restorative Service Providers"

## 2021-12-28 ENCOUNTER — Encounter: Payer: Self-pay | Admitting: Rehabilitative and Restorative Service Providers"

## 2021-12-28 ENCOUNTER — Ambulatory Visit (INDEPENDENT_AMBULATORY_CARE_PROVIDER_SITE_OTHER): Payer: Medicare Other | Admitting: Physician Assistant

## 2021-12-28 VITALS — BP 118/76 | HR 59 | Temp 98.6°F | Ht 66.0 in | Wt 118.0 lb

## 2021-12-28 DIAGNOSIS — R102 Pelvic and perineal pain: Secondary | ICD-10-CM

## 2021-12-28 DIAGNOSIS — M6281 Muscle weakness (generalized): Secondary | ICD-10-CM | POA: Diagnosis not present

## 2021-12-28 DIAGNOSIS — R262 Difficulty in walking, not elsewhere classified: Secondary | ICD-10-CM

## 2021-12-28 DIAGNOSIS — R42 Dizziness and giddiness: Secondary | ICD-10-CM

## 2021-12-28 DIAGNOSIS — Z87440 Personal history of urinary (tract) infections: Secondary | ICD-10-CM | POA: Diagnosis not present

## 2021-12-28 DIAGNOSIS — R2689 Other abnormalities of gait and mobility: Secondary | ICD-10-CM | POA: Diagnosis not present

## 2021-12-28 LAB — POCT URINALYSIS DIPSTICK
Bilirubin, UA: NEGATIVE
Glucose, UA: NEGATIVE
Ketones, UA: NEGATIVE
Nitrite, UA: NEGATIVE
Protein, UA: NEGATIVE
Spec Grav, UA: 1.01 (ref 1.010–1.025)
Urobilinogen, UA: 0.2 E.U./dL
pH, UA: 6 (ref 5.0–8.0)

## 2021-12-28 MED ORDER — PHENAZOPYRIDINE HCL 100 MG PO TABS: 100.0000 mg | ORAL_TABLET | Freq: Three times a day (TID) | ORAL | 0 refills | Status: DC | PRN

## 2021-12-28 NOTE — Progress Notes (Signed)
Subjective:    Patient ID: Lori [name], female    DOB: Sep 19, 1940, 81 y.o.   MRN: [id number]  Chief Complaint  Patient presents with   Urinary Tract Infection    Pt in for possible UTI, pt symptoms started feeling urgency, and Friday it was to late to make an appt, urine was brown tinged like blood in urine; pt has problems with certain antibiotics but Durene Cal knows what she can take;      Patient is in today for urinary symptoms starting 3 days ago. Here with her husband today. States she has hx of recurrent urinary issues. Numerous medication allergies, said she did best with macrobid in the past.   Some complaints of low abdominal pressure. Intermittent hematuria / brown color, says this is not uncommon for her. Some hesitancy with urine. No dysuria or odor. No fevers or chills. No nausea or vomiting. No flank pain.   Trying to drink more water.   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    Eczema    Family history of breast cancer    Family history of kidney cancer    Family history of multiple myeloma    Family history of thyroid cancer    GERD (gastroesophageal reflux disease)    Headache(784.0)    History of IBS    History of kidney stones    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    Hyperlipidemia    on medication   Hypertension    Low back pain    Meniere disease    Neuromuscular disorder (HCC)    raynaud's   Osteopenia    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HCC)    Stroke ([facility]CC) 1988   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 carotid artery dissection     Tubular adenoma of colon 02/2013   Varicose veins with inflammation    upper and lower per pt    Vasculitis [facility])     Past Surgical History:  Procedure Laterality Date   arthroscopic knee  2009   left knee/ torn meniscus   BREAST LUMPECTOMY WITH RADIOACTIVE SEED LOCALIZATION Right 11/05/2020   Procedure: RIGHT BREAST LUMPECTOMY WITH RADIOACTIVE SEED LOCALIZATION;  Surgeon: Harriette Bouillon, MD;  Location: [facility];  Service: General;  Laterality: Right;   BUNIONECTOMY Right 1998   with other foot surgery    carotid artery disection  1988   Carotid Artery Dissection   CATARACT EXTRACTION, BILATERAL  07-18-2017,08-08-2017   CESAREAN SECTION  1988   1 time   COLONOSCOPY  2019   last 2019   DILATION AND CURETTAGE OF UTERUS  2004   EYE SURGERY     EYE SURGERY  09/25/2021   lowere lid of left eye   POLYPECTOMY     POPLITEAL SYNOVIAL CYST EXCISION     left leg   TONSILLECTOMY  1957   UPPER GASTROINTESTINAL  ENDOSCOPY     last 2018    Family History  Problem Relation Age of Onset   Multiple myeloma Father 51   Thyroid cancer Sister 26       s/p removal. papilary and anaplastic.    Lung cancer Sister    Kidney disease Brother        cancer- removed   Kidney cancer Brother        dx early 55s   Alzheimer's disease Brother    Stroke Brother 20   Breast cancer Cousin 64       paternal first cousin   Cancer Cousin        unknown type, paternal first cousin   Breast cancer Other        mother's first cousin   Cervical cancer Other    Colon cancer Neg Hx    Esophageal cancer Neg Hx    Rectal cancer Neg Hx    Stomach cancer Neg Hx    Colon polyps Neg Hx     Social History   Tobacco Use   Smoking status: Never   Smokeless tobacco: Never  Substance Use Topics   Alcohol use: No    Alcohol/week: 0.0 standard drinks of alcohol   Drug use: No     Allergies  Allergen Reactions   Hydrocodone-Acetaminophen     VOMITING   Azithromycin Other (See Comments)     Thrush    Bacitracin-Polymyxin B Itching    Burning and runny eye    Klonopin [Clonazepam]     -august 27th- took one dose and experienced zombie like sensation- states will never take agian   Neomycin    Nitrofurantoin     Reports tingling on tongue- no swelling   Penicillins     REACTION: Arm swelling   Pneumococcal Vaccine Polyvalent     ARM REDNESS WITH TENDERNESS   Pneumovax [Pneumococcal Polysaccharide Vaccine]     Review of Systems NEGATIVE UNLESS OTHERWISE INDICATED IN HPI      Objective:     BP 118/76 (BP Location: Left Arm)   Pulse (!) 59   Temp 98.6 F (37 C) (Temporal)   Ht 5\' 6"  (1.676 m)   Wt 118 lb (53.5 kg)   SpO2 97%   BMI 19.05 kg/m   Wt Readings from Last 3 Encounters:  12/28/21 118 lb (53.5 kg)  11/25/21 115 lb 12.8 oz (52.5 kg)  11/02/21 116 lb 12.8 oz (53 kg)    BP Readings from Last 3 Encounters:  12/28/21 118/76  11/25/21 120/70  11/02/21 126/78     Physical Exam Vitals and nursing note reviewed.  Constitutional:      General: She is not in acute distress.    Appearance: Normal appearance. She is not ill-appearing.  HENT:     Head: Normocephalic and atraumatic.  Cardiovascular:     Rate and Rhythm: Normal rate and regular rhythm.     Pulses: Normal pulses.     Heart sounds: Normal heart sounds.  Pulmonary:     Effort: Pulmonary effort is normal.     Breath sounds: Normal breath sounds.  Abdominal:     General: Abdomen is flat. Bowel sounds are normal.     Palpations: Abdomen is soft.     Tenderness: There is abdominal tenderness (suprapubic). There is no right CVA tenderness or left CVA tenderness.  Skin:    General: Skin is warm and dry.  Neurological:     General: No focal deficit present.  Mental Status: She is alert.  Psychiatric:        Mood and Affect: Mood normal.        Assessment & Plan:  History of recurrent UTIs -     POCT urinalysis dipstick -     Urine Culture  Suprapubic pressure -     Urine  Culture  Other orders -     Phenazopyridine HCl; Take 1 tablet (100 mg total) by mouth 3 (three) times daily as needed for pain. Do not use longer than 2 days total.  Dispense: 10 tablet; Refill: 0    -UA shows trace leukocytes, 2+ blood (noted on previous UA's); will hold on antibiotics as this time until culture returns -She may try pyridium in the meantime to help with symptoms. -She will push fluids, take Tylenol prn; monitor symptoms -Start antibiotics pending urinary culture    Return if symptoms worsen or fail to improve.    Caelen Higinbotham M Neel Buffone, PA-C

## 2021-12-28 NOTE — Therapy (Addendum)
OUTPATIENT PHYSICAL THERAPY VESTIBULAR TREATMENT NOTE AND DISCHARGE SUMMARY     Patient Name: Lori [name] MRN: [id number] DOB:12/27/1940, 81 y.o., female Today's Date: 12/28/2021  PCP: [name], MD REFERRING PROVIDER: [name], MD   PT End of Session - 12/28/21 0945     Visit Number 7    Date for PT Re-Evaluation 01/08/22    Authorization Type [employer] Medicare    Progress Note Due on Visit 10    PT Start Time 0930    PT Stop Time 0956    PT Time Calculation (min) 26 min    Activity Tolerance Patient tolerated treatment well    Behavior During Therapy WFL for tasks assessed/performed             Past Medical History:  Diagnosis Date   Allergy    Anemia    past hx of anemia   Aneurysm ([place])    pseudo-aneurym of carotid arteries per pt   Anxiety    Aortic atherosclerosis (H[facility])    Arthritis    knee- DJD    Barrett's esophagus    Breast cancer ([place])    right breast IDC   Burning mouth syndrome    Dr Redmond Baseman 11-2016 - no smell or taste x 4 yrs per pt    Cataract    bilateral    Clotting disorder ([place]) 1988   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    Eczema    Family history of breast cancer    Family history of kidney cancer    Family history of multiple myeloma    Family history of thyroid cancer    GERD (gastroesophageal reflux disease)    Headache(784.0)    History of IBS    History of kidney stones    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    Hyperlipidemia    on medication   Hypertension    Low back pain    Meniere disease    Neuromuscular disorder ([place])    raynaud's   Osteopenia    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[place])    Stroke ([place]) 1988   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 carotid artery dissection    Tubular adenoma of colon 02/2013   Varicose veins with inflammation    upper and lower per pt    Vasculitis [facility])    Past Surgical History:  Procedure Laterality Date   arthroscopic knee  2009   left knee/ torn meniscus    BREAST LUMPECTOMY WITH RADIOACTIVE SEED LOCALIZATION Right 11/05/2020   Procedure: RIGHT BREAST LUMPECTOMY WITH RADIOACTIVE SEED LOCALIZATION;  Surgeon: Erroll Luna, MD;  Location: [place];  Service: General;  Laterality: Right;   BUNIONECTOMY Right 1998   with other foot surgery    carotid artery disection  1988   Carotid Artery Dissection   CATARACT EXTRACTION, BILATERAL  07-18-2017,08-08-2017   CESAREAN SECTION  1988   1 time   COLONOSCOPY  2019   last 2019   [facility] UTERUS  2004   EYE SURGERY     EYE SURGERY  09/25/2021   lowere lid of left eye   POLYPECTOMY     POPLITEAL SYNOVIAL CYST EXCISION     left leg   TONSILLECTOMY  1957   UPPER GASTROINTESTINAL ENDOSCOPY     last 2018   Patient Active Problem List   Diagnosis Date Noted   Cerebral arterial aneurysm 11/02/2021   History of stroke 10/22/2021   Bilateral sensorineural hearing  loss 09/14/2021   Imbalance 09/14/2021   Abnormal ear sensation, right 02/27/2021   Genetic testing 11/10/2020   Family history of kidney cancer 10/22/2020   Family history of thyroid cancer 10/22/2020   Family history of multiple myeloma 10/22/2020   Family history of breast cancer 10/22/2020   Malignant neoplasm of lower-inner quadrant of right breast of female, estrogen receptor positive ([place]) 10/17/2020   Parotid adenoma 06/26/2020   Post-nasal drainage 06/05/2020   Subjective tinnitus of both ears 06/05/2020   Vertigo 01/22/2020   IBS (irritable bowel syndrome) 06/26/2019   Osteoarthritis of left knee 09/15/2017   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   Anosmia 03/30/2017   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   Aortic atherosclerosis ([place]) 02/11/2016   Hypertension 11/29/2013   Eczema 11/29/2013   Hyperlipidemia 11/29/2013   Barrett's esophagus 08/24/2013   GERD (gastroesophageal reflux disease) 05/16/2013   Vitamin D deficiency 01/29/2012   Solitary pulmonary nodule 06/08/2011   HIATAL HERNIA WITH  REFLUX 02/06/2010   DEGENERATIVE JOINT DISEASE, KNEE 03/14/2008   VARICOSE VEINS LOWER EXTREMITIES W/INFLAMMATION 03/09/2007   Allergic rhinitis 03/09/2007   ACTINIC KERATOSIS, FOREHEAD, LEFT 03/09/2007   Headache(784.0) 03/09/2007   MENIERE'S DISEASE 09/22/2006   RAYNAUD'S DISEASE 09/22/2006   Osteoporosis 09/22/2006    ONSET DATE: 10/22/2021   REFERRING DIAG: R42 (ICD-10-CM) - Vertigo   THERAPY DIAG:  Vertigo  Balance problem  Dizziness and giddiness  Muscle weakness (generalized)  Difficulty in walking, not elsewhere classified  SUBJECTIVE:   SUBJECTIVE STATEMENT: Pt states that she is not feeling well and is thinking that she may have a UTI, she goes to the MD following this PT session.  Pt reports increased knee pain today.  PERTINENT HISTORY: Osteopenia, borderline osteoporosis, hx LBP, Breast CA , Hx of knee surgery, varicose veins, COVID on 04/17/21, vertigo back in 12/2019, Mineare's Disease   PAIN:  Are you having pain? Yes: NPRS scale: 7/10 Pain location: bilateral knees Pain description: shooting Aggravating factors: walking Relieving factors: rest  PRECAUTIONS: Fall  WEIGHT BEARING RESTRICTIONS No  FALLS: Has patient fallen in last 6 months? No  LIVING ENVIRONMENT: Lives with: lives with their spouse Lives in: House/apartment Stairs: No Has following equipment at home: Single point cane, Environmental consultant - 2 wheeled, shower chair, and Grab bars  PLOF: Independent and Leisure: likes to walk with her friends in the park, walk her dogs, play with grandchildren  PATIENT GOALS:  To be able to get back to walking and driving.  OBJECTIVE:   DIAGNOSTIC FINDINGS: N/A  COGNITION: Overall cognitive status: Within functional limits for tasks assessed   POSTURE: rounded shoulders and forward head   Cervical ROM:    Active A/PROM (deg) 11/17/2021  Flexion 40  Extension 30  Right lateral flexion 30  Left lateral flexion 20  Right rotation 40  Left  rotation 40  (Blank rows = not tested)  STRENGTH:  11/17/21:  B shoulder strength of 4 to 4+/5 throughout B hip strength of 4 to 4+/5 grossly throughout   BED MOBILITY:  WFL, but reports that she sometimes has dizziness when she lies flat  GAIT: Gait pattern: antalgic, poor foot clearance- Right, and poor foot clearance- Left Distance walked: 50 Assistive device utilized: Single point cane Level of assistance: SBA Comments: Pt with unsteady gait pattern  FUNCTIONAL TESTs:  11/17/2021: 5 times sit to stand: 15.2 sec Timed up and go (TUG): 12.7 sec without AD MCTSIB: Condition 1: Avg of 3 trials: 30 sec, Condition 2: Avg of 3 trials:  15 sec, Condition 3: Avg of 3 trials: 30 sec, Condition 4: Avg of 3 trials: 3 sec, and Total Score: 78/120  PATIENT SURVEYS:  11/17/2021: DHI 38 DHI Total Score: 38 / 100 Physical Score: 12 / 28 Emotional Score: 8 / 36 Functional Score: 18 / 36   12/14/2021: DHI Total Score: 0 / 100 Physical Score: 0 / 28 Emotional Score: 0 / 36 Functional Score: 0 / 36   VESTIBULAR ASSESSMENT   GENERAL OBSERVATION: Pt with unsteadiness of gait noted with slow gait velocity    SYMPTOM BEHAVIOR:   Subjective history: Pt has had a history of dizziness and Menire's Disease before, but states that this dizziness is different.   Non-Vestibular symptoms: changes in hearing and tinnitus   Type of dizziness: Imbalance (Disequilibrium)   Frequency: daily   Duration: throughout the day   Aggravating factors: Induced by position change: lying supine and supine to sit and Induced by motion: turning head quickly and driving   Relieving factors: head stationary   Progression of symptoms: unchanged   OCULOMOTOR EXAM:   Ocular Alignment: normal   Ocular ROM:  some decreased occular ROM noted   Spontaneous Nystagmus: absent   Gaze-Induced Nystagmus: absent   Smooth Pursuits: intact   Saccades: intact      VESTIBULAR - OCULAR REFLEX:    Slow VOR: Positive  Bilaterally      POSITIONAL TESTING: Left Dix-Hallpike: nystagmus noted; Duration: less than 1 min   OTHOSTATICS: not done  FUNCTIONAL GAIT:    DYNAMIC GAIT INDEX 3-Normal, 2-Mild Impairment, 1-Moderate Impairments, 0-Severe Impairment  11/27/2021 12/21/2021  LEVEL SURFACE 2 3  CHANGE IN GAIT SPEED 2 2  GAIT WITH HORIZONTAL HEAD TURNS 1 2  GAIT WITH VERTICAL HEAD TURNS 1 2  GAIT AND PIVOT TURN 2 3  STEP OVER OBSTACLE 1 2  STEP AROUND OBSTACLES 2 3  STEPS 2 2  TOTAL 13/24 19/24     VESTIBULAR TREATMENT:  12/28/2021: Manual Therapy:  Addaday to bilateral quads secondary to reported knee pain.  Pt verbalizes decreased knee pain following. Seated on green pball:  4 way pelvic tilts 2x10, marching and LAQ 2x10 bilat Rocker board 2 min Standing on rocker board performing head turns x1 min   12/21/2021: Nustep level 5 x8 min with PT present to discuss status Sit to/from stand with blue foam holding 3#:  x10 with chest press, x10 with overhead press Standing on blue foam performing shoulder ER and shoulder horizontal abduction with red tband 2x10 each bilat Dynamic gait activities Standing on blue foam performing head nods, then head rotations.  X1 min each Standing on blue foam performing ball tosses and ball bounces.   12/14/2021: Nustep level 5 x8 min with PT present to discuss status Sit to/from stand with blue foam holding 3#:  x10 with chest press, x10 with overhead press Standing on blue foam performing shoulder ER and shoulder horizontal abduction with red tband 2x10 each bilat Standing on blue foam performing marching 2x10 Walking down hallway performing head nods and head rotations 2x15' each Tandem gait down barre and back x3 laps down and back    PATIENT EDUCATION: Education details: Issued HEP Person educated: Patient Education method: Explanation, Demonstration, and Handouts Education comprehension: verbalized understanding and returned demonstration  HOME  EXERCISE PROGRAM: Access Code: [facility] URL: [email or web address] Date: 12/14/2021 Prepared by: Juel Burrow  Exercises - Brandt-Daroff Vestibular Exercise  - 1 x daily - 7 x weekly - 1 sets - 5 reps -  Seated Gaze Stabilization with Head Rotation  - 1 x daily - 7 x weekly - 2 sets - 30 reps - Seated Gaze Stabilization with Head Nod  - 1 x daily - 7 x weekly - 3 sets - 10 reps - Walking with Head Nod  - 1 x daily - 7 x weekly - 3 sets - 10 reps - Walking with Head Rotation  - 1 x daily - 7 x weekly - 3 sets - 10 reps - Sit to Stand on Foam Pad  - 1 x daily - 7 x weekly - 2 sets - 10 reps - Shoulder External Rotation and Scapular Retraction with Resistance  - 1 x daily - 7 x weekly - 2 sets - 10 reps - Standing Shoulder Horizontal Abduction with Resistance  - 1 x daily - 7 x weekly - 2 sets - 10 reps - Standing March with Counter Support  - 1 x daily - 7 x weekly - 2 sets - 10 reps   GOALS: Goals reviewed with patient? Yes  SHORT TERM GOALS: Target date: 12/08/2021  Pt will be independent with initial HEP. Baseline: Goal status: MET  2.  Pt to report a 40% improvement in dizziness. Baseline:  Goal status: MET   LONG TERM GOALS: Target date: 01/08/2022  Pt will be independent with advanced HEP. Baseline:  Goal status: MET  2.  Pt will report at least a 75% improvement in dizziness symptoms since starting PT. Baseline:  Goal status: MET  3.  Pt will be able to go walking with her friends for at least 30 minutes without reports of increased dizziness or loss of balance. Baseline:  Goal status: MET  4.  Pt will increase modified CTSIB to at least 100/120 to decrease her risk of falling Baseline: 78/120 Goal status: IN PROGRESS  5.  Pt will report being able to return to driving without increased dizziness. Baseline: unable to drive Goal status: IN PROGRESS  6.  Pt to increase bilat UE/LE strength to at least 4+/5 to allow her to perform functional  activities in her home with increased ease.  Baseline:  4 to 4+/5  Goal status:  IN PROGRESS   ASSESSMENT:  CLINICAL IMPRESSION: Ms Mellone presents to skilled rehab reporting not feeling well and going to the MD following this appointment to check for a UTI.  Pt reports increased knee pain today, so performed Addaday to quads and pt reported decreased pain in her knees following.  Pt able to perform balance and stability exercises during session today.  Session limited in time secondary to pt not feeling well and going to MD following visit to rule out UTI.   OBJECTIVE IMPAIRMENTS Abnormal gait, decreased balance, difficulty walking, decreased strength, dizziness, postural dysfunction, and pain.   ACTIVITY LIMITATIONS community activity and driving.   PERSONAL FACTORS Age and 3+ comorbidities: recent Covid-19, Menire's Disease, osteopenia  are also affecting patient's functional outcome.    REHAB POTENTIAL: Good  CLINICAL DECISION MAKING: Evolving/moderate complexity  EVALUATION COMPLEXITY: Moderate   PLAN: PT FREQUENCY: 1-2x/week  PT DURATION: 8 weeks  PLANNED INTERVENTIONS: Therapeutic exercises, Therapeutic activity, Neuromuscular re-education, Balance training, Gait training, Patient/Family education, Joint mobilization, Stair training, Vestibular training, Canalith repositioning, Aquatic Therapy, Dry Needling, Cryotherapy, Moist heat, Taping, and Manual therapy  PLAN FOR NEXT SESSION: Canalith repositioning as indicated, balance, gaze habituation    PHYSICAL THERAPY DISCHARGE SUMMARY  On 01/05/2022, pt called in to cancel her last scheduled PT visit stating that her  dizziness has improved and she had something come up so she cannot attend the discharge visit. Pt discharged at this time with pt stating that she is pleased with her progress and denies any dizziness.  Patient agrees to discharge. Patient goals were partially met.  Unable to reassess all goals due to pt  canceling last scheduled visit. Patient is being discharged due to being pleased with the current functional level.    Juel Burrow, PT 12/28/2021, 10:08 AM   [facility] [address] [place], [place] [address] Phone # [phone] Fax [phone]

## 2021-12-29 LAB — URINE CULTURE
MICRO NUMBER:: 14025616
Result:: NO GROWTH
SPECIMEN QUALITY:: ADEQUATE

## 2022-01-04 ENCOUNTER — Encounter: Payer: Medicare Other | Admitting: Rehabilitative and Restorative Service Providers"

## 2022-01-05 ENCOUNTER — Ambulatory Visit: Payer: Medicare Other | Admitting: Rehabilitative and Restorative Service Providers"

## 2022-01-06 ENCOUNTER — Telehealth: Payer: Self-pay

## 2022-01-06 ENCOUNTER — Encounter: Payer: Self-pay | Admitting: Family Medicine

## 2022-01-06 ENCOUNTER — Ambulatory Visit (INDEPENDENT_AMBULATORY_CARE_PROVIDER_SITE_OTHER): Payer: Medicare Other | Admitting: Family Medicine

## 2022-01-06 VITALS — BP 112/62 | HR 53 | Temp 97.3°F | Ht 66.0 in | Wt 117.6 lb

## 2022-01-06 DIAGNOSIS — M816 Localized osteoporosis [Lequesne]: Secondary | ICD-10-CM

## 2022-01-06 DIAGNOSIS — R739 Hyperglycemia, unspecified: Secondary | ICD-10-CM

## 2022-01-06 DIAGNOSIS — Z23 Encounter for immunization: Secondary | ICD-10-CM

## 2022-01-06 DIAGNOSIS — Z0000 Encounter for general adult medical examination without abnormal findings: Secondary | ICD-10-CM | POA: Diagnosis not present

## 2022-01-06 DIAGNOSIS — E559 Vitamin D deficiency, unspecified: Secondary | ICD-10-CM

## 2022-01-06 DIAGNOSIS — E785 Hyperlipidemia, unspecified: Secondary | ICD-10-CM | POA: Diagnosis not present

## 2022-01-06 LAB — COMPREHENSIVE METABOLIC PANEL
ALT: 17 U/L (ref 0–35)
AST: 21 U/L (ref 0–37)
Albumin: 4.3 g/dL (ref 3.5–5.2)
Alkaline Phosphatase: 49 U/L (ref 39–117)
BUN: 22 mg/dL (ref 6–23)
CO2: 28 mEq/L (ref 19–32)
Calcium: 9.5 mg/dL (ref 8.4–10.5)
Chloride: 103 mEq/L (ref 96–112)
Creatinine, Ser: 0.64 mg/dL (ref 0.40–1.20)
GFR: 83.81 mL/min (ref >60.00)
Glucose, Bld: 72 mg/dL (ref 70–99)
Potassium: 4.1 mEq/L (ref 3.5–5.1)
Sodium: 137 mEq/L (ref 135–145)
Total Bilirubin: 0.3 mg/dL (ref 0.2–1.2)
Total Protein: 7.9 g/dL (ref 6.0–8.3)

## 2022-01-06 LAB — CBC WITH DIFFERENTIAL/PLATELET
Basophils Absolute: 0 10*3/uL (ref 0.0–0.1)
Basophils Relative: 0.4 % (ref 0.0–3.0)
Eosinophils Absolute: 0.2 10*3/uL (ref 0.0–0.7)
Eosinophils Relative: 3 % (ref 0.0–5.0)
HCT: 37.7 % (ref 36.0–46.0)
Hemoglobin: 12.2 g/dL (ref 12.0–15.0)
Lymphocytes Relative: 33.3 % (ref 12.0–46.0)
Lymphs Abs: 2.1 10*3/uL (ref 0.7–4.0)
MCHC: 32.3 g/dL (ref 30.0–36.0)
MCV: 87.2 fl (ref 78.0–100.0)
Monocytes Absolute: 0.8 10*3/uL (ref 0.1–1.0)
Monocytes Relative: 12.2 % — ABNORMAL HIGH (ref 3.0–12.0)
Neutro Abs: 3.3 10*3/uL (ref 1.4–7.7)
Neutrophils Relative %: 51.1 % (ref 43.0–77.0)
Platelets: 254 10*3/uL (ref 150.0–400.0)
RBC: 4.32 Mil/uL (ref 3.87–5.11)
RDW: 14.4 % (ref 11.5–15.5)
WBC: 6.4 10*3/uL (ref 4.0–10.5)

## 2022-01-06 LAB — LIPID PANEL
Cholesterol: 189 mg/dL (ref 0–200)
HDL: 80.2 mg/dL (ref >39.00)
LDL Cholesterol: 96 mg/dL (ref 0–99)
NonHDL: 108.83
Total CHOL/HDL Ratio: 2
Triglycerides: 65 mg/dL (ref 0.0–149.0)
VLDL: 13 mg/dL (ref 0.0–40.0)

## 2022-01-06 LAB — HEMOGLOBIN A1C: Hgb A1c MFr Bld: 6.2 % (ref 4.6–6.5)

## 2022-01-06 LAB — VITAMIN D 25 HYDROXY (VIT D DEFICIENCY, FRACTURES): VITD: 34.93 ng/mL (ref 30.00–100.00)

## 2022-01-06 MED ORDER — ONDANSETRON 4 MG PO TBDP: 4.0000 mg | ORAL_TABLET | Freq: Three times a day (TID) | ORAL | 0 refills | Status: DC | PRN

## 2022-01-06 NOTE — Telephone Encounter (Signed)
Called back and schedule in person visit per her preference, for 10/23 at 1:15 pm. She is aware of appt.

## 2022-01-06 NOTE — Telephone Encounter (Addendum)
Returned her call. She is canceling lab and injection appt scheduled for Prolia 12/15 and injection appt for 08/23/22 when she see Dr. Lindi Adie. Appts canceled. She cannot afford Prolia co-pay of $342. She did receive Prolia injection in June, 2023.  She is asking for appt with Wilber Bihari, NP to discuss other options that she could take instead of the Prolia?

## 2022-01-06 NOTE — Progress Notes (Signed)
Phone [phone]   Subjective:  Patient presents today for their annual physical. Chief complaint-noted.   See problem oriented charting- ROS- full  review of systems was completed and negative except for: back pain issues, anxiety  The following were reviewed and entered/updated in epic: 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   Anxiety    Aortic atherosclerosis (HCC)    Arthritis    knee- DJD    Barrett's esophagus    Breast cancer (HCC)    right breast IDC   Burning mouth syndrome    Dr Redmond Baseman 11-2016 - no smell or taste x 4 yrs per pt    Cataract    bilateral    Clotting disorder ([place]) 1988   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    Eczema    Family history of breast cancer    Family history of kidney cancer    Family history of multiple myeloma    Family history of thyroid cancer    GERD (gastroesophageal reflux disease)    Headache(784.0)    History of IBS    History of kidney stones    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    Hyperlipidemia    on medication   Hypertension    Low back pain    Meniere disease    Neuromuscular disorder ([place])    raynaud's   Osteopenia    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[place])    Stroke ([place]) 1988   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 carotid artery dissection    Tubular adenoma of colon 02/2013   Varicose veins with inflammation    upper and lower per pt    Vasculitis [facility])    Patient Active Problem List   Diagnosis Date Noted   History of stroke 10/22/2021    Priority: High   Malignant neoplasm of lower-inner quadrant of right breast of female, estrogen receptor positive ([place]) 10/17/2020    Priority: High   Hyperglycemia 01/06/2022    Priority: Medium    Parotid adenoma 06/26/2020    Priority: Medium    Osteoarthritis of left knee 09/15/2017    Priority: Medium    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    Priority: Medium     Hypertension 11/29/2013    Priority: Medium    Hyperlipidemia 11/29/2013    Priority: Medium    Barrett's esophagus 08/24/2013    Priority: Medium    Allergic rhinitis 03/09/2007    Priority: Medium    Osteoporosis 09/22/2006    Priority: Medium    IBS (irritable bowel syndrome) 06/26/2019    Priority: Low   Aortic atherosclerosis ([place]) 02/11/2016    Priority: Low   Eczema 11/29/2013    Priority: Low   GERD (gastroesophageal reflux disease) 05/16/2013    Priority: Low   Vitamin D deficiency 01/29/2012    Priority: Low   Solitary pulmonary nodule 06/08/2011    Priority: Low   HIATAL HERNIA WITH REFLUX 02/06/2010    Priority: Low   DEGENERATIVE JOINT DISEASE, KNEE 03/14/2008    Priority: Low   VARICOSE VEINS LOWER EXTREMITIES W/INFLAMMATION 03/09/2007    Priority: Low   ACTINIC KERATOSIS, FOREHEAD, LEFT 03/09/2007    Priority: Low   Headache(784.0) 03/09/2007    Priority: Low   MENIERE'S DISEASE 09/22/2006    Priority: Low   RAYNAUD'S DISEASE 09/22/2006    Priority:  Low   Abnormal ear sensation, right 02/27/2021    Priority: 1.   Family history of kidney cancer 10/22/2020    Priority: 1.   Family history of thyroid cancer 10/22/2020    Priority: 1.   Family history of multiple myeloma 10/22/2020    Priority: 1.   Family history of breast cancer 10/22/2020    Priority: 1.   Cerebral arterial aneurysm 11/02/2021   Bilateral sensorineural hearing loss 09/14/2021   Imbalance 09/14/2021   Genetic testing 11/10/2020   Post-nasal drainage 06/05/2020   Subjective tinnitus of both ears 06/05/2020   Vertigo 01/22/2020   Anosmia 03/30/2017   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   Past Surgical History:  Procedure Laterality Date   arthroscopic knee  2009   left knee/ torn meniscus   BREAST LUMPECTOMY WITH RADIOACTIVE SEED LOCALIZATION Right 11/05/2020   Procedure: RIGHT BREAST LUMPECTOMY WITH RADIOACTIVE SEED LOCALIZATION;  Surgeon: Erroll Luna, MD;  Location: [facility];  Service: General;  Laterality: Right;   BUNIONECTOMY Right 1998   with other foot surgery    carotid artery disection  1988   Carotid Artery Dissection   CATARACT EXTRACTION, BILATERAL  07-18-2017,08-08-2017   CESAREAN SECTION  1988   1 time   COLONOSCOPY  2019   last 2019   Guy OF UTERUS  2004   EYE SURGERY     EYE SURGERY  09/25/2021   lowere lid of left eye   POLYPECTOMY     POPLITEAL SYNOVIAL CYST EXCISION     left leg   TONSILLECTOMY  1957   UPPER GASTROINTESTINAL ENDOSCOPY     last 2018    Family History  Problem Relation Age of Onset   Multiple myeloma Father 86   Thyroid cancer Sister 75       s/p removal. papilary and anaplastic.    Lung cancer Sister    Kidney disease Brother        cancer- removed   Kidney cancer Brother        dx early 47s   Alzheimer's disease Brother    Stroke Brother 63   Breast cancer Cousin 1       paternal first cousin   Cancer Cousin        unknown type, paternal first cousin   Breast cancer Other        mother's first cousin   Cervical cancer Other    Colon cancer Neg Hx    Esophageal cancer Neg Hx    Rectal cancer Neg Hx    Stomach cancer Neg Hx    Colon polyps Neg Hx     Medications- reviewed and updated Current Outpatient Medications  Medication Sig Dispense Refill   amLODipine (NORVASC) 2.5 MG tablet TAKE 1 TABLET BY MOUTH EVERY DAY 90 tablet 2   aspirin 81 MG tablet Take 81 mg by mouth daily. Evening     bacitracin-polymyxin b (POLYSPORIN) ophthalmic ointment      cyanocobalamin 1000 MCG tablet Take 1,000 mcg by mouth once a week.     letrozole (FEMARA) 2.5 MG tablet TAKE 1 TABLET BY MOUTH EVERY DAY 90 tablet 3   loratadine (CLARITIN) 10 MG tablet Take 10 mg by mouth daily. Take 1/2 tablet daily     metoprolol tartrate (LOPRESSOR) 50 MG tablet TAKE 1/2 TABLET BY MOUTH 2 TIMES DAILY 90 tablet 1   montelukast (SINGULAIR) 10 MG tablet TAKE 1 TABLET BY MOUTH AT BEDTIME 90 tablet 1  pantoprazole (PROTONIX) 40 MG tablet TAKE 1 TABLET BY MOUTH EVERY DAY 90 tablet 1   phenazopyridine (PYRIDIUM) 100 MG tablet Take 1 tablet (100 mg total) by mouth 3 (three) times daily as needed for pain. Do not use longer than 2 days total. 10 tablet 0   rosuvastatin (CRESTOR) 10 MG tablet Take 1 tablet (10 mg total) by mouth once a week. 13 tablet 3   ondansetron (ZOFRAN-ODT) 4 MG disintegrating tablet Take 1 tablet (4 mg total) by mouth every 8 (eight) hours as needed for nausea or vomiting. 10 tablet 0   No current facility-administered medications for this visit.    Allergies-reviewed and updated Allergies  Allergen Reactions   Hydrocodone-Acetaminophen     VOMITING   Azithromycin Other (See Comments)    Thrush    Bacitracin-Polymyxin B Itching    Burning and runny eye    Klonopin [Clonazepam]     -august 27th- took one dose and experienced zombie like sensation- states will never take agian   Neomycin    Nitrofurantoin     Reports tingling on tongue- no swelling   Penicillins     REACTION: Arm swelling   Pneumococcal Vaccine Polyvalent     ARM REDNESS WITH TENDERNESS   Pneumovax [Pneumococcal Polysaccharide Vaccine]     Social History   Social History Narrative   Lives with husband who is also a patient of Dr. Yong Channel. Alvester Chou [address] 51, Karleen [place] (sees Dr. Yong Channel), Sarah 34. 3 grandkids in 2022   Objective  Objective:  BP 112/62   Pulse (!) 53   Temp (!) 97.3 F (36.3 C)   Ht $R'5\' 6"'hH$  (1.676 m)   Wt 117 lb 9.6 oz (53.3 kg)   SpO2 95%   BMI 18.98 kg/m  Gen: NAD, resting comfortably HEENT: Mucous membranes are moist. Oropharynx normal Neck: no thyromegaly CV: RRR no murmurs rubs or gallops Lungs: CTAB no crackles, wheeze, rhonchi Abdomen: soft/nontender/nondistended/normal bowel sounds. No rebound or guarding.  Ext: no edema Skin: warm, dry Neuro: grossly normal, moves all extremities, PERRLA   Assessment and Plan   81 y.o. female presenting for annual  physical.  Health Maintenance counseling: 1. Anticipatory guidance: Patient counseled regarding regular dental exams -q6 months, eye exams- unfortunately eye lash issues grew back- considering another surgery - could bother the cornea- working with Dr. Hollice Espy,  avoiding smoking and second hand smoke , limiting alcohol to 1 beverage per day- doesn't drink , no illicit drugs .   2. Risk factor reduction:  Advised patient of need for regular exercise and diet rich and fruits and vegetables to reduce risk of heart attack and stroke.  Exercise- starting back walking with her friends.  Diet/weight management-weight up 3 pounds from last year thankfully- would focus on mild weight gain.  Wt Readings from Last 3 Encounters:  01/06/22 117 lb 9.6 oz (53.3 kg)  12/28/21 118 lb (53.5 kg)  11/25/21 115 lb 12.8 oz (52.5 kg)  3. Immunizations/screenings/ancillary studies-reacted poorly to pneumovax 23 in past- wants to hold off on prevnar 20, flu shot today , covid shot  at pharmacy , rsv- opt out for now , shingrix 2nd one planned later this month Immunization History  Administered Date(s) Administered   Fluad Quad(high Dose 65+) 12/01/2018, 12/27/2019, 12/01/2020   Influenza Split 01/04/2012   Influenza Whole 01/03/2007, 12/08/2007, 12/17/2008, 01/20/2010   Influenza, High Dose Seasonal PF 01/10/2015, 12/12/2015, 12/29/2016, 12/23/2017   Influenza,inj,Quad PF,6+ Mos 01/05/2013, 12/24/2013   PFIZER(Purple Top)SARS-COV-2 Vaccination 04/30/2019,  05/23/2019, 02/05/2020   Pfizer Covid-19 Vaccine Bivalent Booster 65yrs & up 02/03/2021   Pneumococcal Polysaccharide-23 02/12/2004   Td 03/23/2003, 04/04/2013   Zoster Recombinat (Shingrix) 11/13/2021  4. Cervical cancer screening- past age based screening recommendations. No discharge or blood  5. Breast cancer screening-  see breast cancer discussion below 6. Colon cancer screening - February 2020 with no plans for repeat at this point per Dr. Fuller Plan. No brbpr or  melena  7. Skin cancer screening-[place] [facility] Dr. Elvera Lennox yearly typically in January. advised regular sunscreen use. Denies worrisome, changing, or new skin lesions.   8. Birth control/STD check-postmenopausal and monogamous  9. Osteoporosis - treated by Dr. 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  Patient with right lumpectomy 11/05/2020 with negative margins thankfully.  As a result she does not require adjuvant radiation.  She will be on letrozole daily for 5 years.  She is following with Dr. Lindi Adie and Dr. Brantley Stage -just had 11/10/21  # Hypertension S:compliant with amlodipine 2.$RemoveBeforeDEI'5mg'DFRDwvxqgdgIDiTL$ , metoprolol 25 mg BID.   A/P:  Controlled. Continue current medications.   # GERD/barrett's esophagus not noted 10/2019 Dr. Fuller Plan- q3 year endoscopy S:Compliant with protonix $RemoveBefor'40mg'qIYNTBHrxzWV$  daily- using [employer] coffee and manuka honey starting late 2019 has been very helpful A/P: next endoscopy planned next year- overall stable   # Hyperlipidemia/aortic atherosclerosis #history of stroke on imaging- remote- on asa 81 mg  S:medication: rosuvastatin 10 mg once a week -prior fenofbrate  LDL goal at least under 100 and triglyceride goal under 200.  Lab Results  Component Value Date   CHOL 156 01/02/2021   HDL 74.80 01/02/2021   LDLCALC 74 01/02/2021   LDLDIRECT 74.0 02/06/2016   TRIG 37.0 01/02/2021   CHOLHDL 2 01/02/2021  A/P: close to ideal goal with stroke history and LDL 74- update lipids today and unless worsening can adjust    # TMJD - intermittent issues right jaw. Has a mouthpiece she can use - not needing  # Hyperglycemia/insulin resistance/prediabetes S:  Medication: none Lab Results  Component Value Date   HGBA1C 6.2 05/20/2021   HGBA1C 6.1 01/02/2021   HGBA1C 6.2 06/26/2020    A/P: hopefully stable or improved- update a1c today. Continue without meds for  now  # mid to upper back spasms- heating pad helpful worse over time but heating pad still helps. Has not even taken tylenol for it - overall stable  #Allergic rhinitis S: Singulair works extremely well for her  A/P: reasonable control- continue current meds  # Osteoporosis S: Last DEXA: On March 30, 2021   Medication (bisphosphonate or prolia): prolia managed by Dr. Lindi Adie- has to stop due to expense unfortunately - has had to help her daughter and has pulled funds away  Calcium: $RemoveB'1200mg'WKVoNYtD$  (through diet ok) recommended  Vitamin D: 1000 units a day recommended- takes 2000 units A/P: off prolia may worsen but I understand her situation   #Hematuria 12/01/2020-saw urology and had cystoscopy-was told to follow-up as needed-thought to be related to kidney stones. Otps out of urine  #BPPV- doing better after epley maneuvers sept 2023  and tries to maintain home exercises.   Recommended follow up: Return in about 6 months (around 07/08/2022) for followup or sooner if needed.Schedule b4 you leave. Future Appointments  Date Time Provider [place]  03/05/2022 10:00 AM [facility] None  03/05/2022 10:45 AM [facility] [place] FL[facility]  [facility] None  06/21/2022  3:30 PM [facility] PHARMACIST [facility] PEC  08/23/2022  9:30 AM [facility] None  08/23/2022 10:00 AM Nicholas Lose, MD [facility] None  08/23/2022 10:45 AM [facility] [place] FLUSH [facility] None  10/14/2022  2:45 PM [facility] HEALTH COACH [facility]EC   Lab/Order associations: NOT fasting- tyler coffee and 1 shredded weak biscuit   ICD-10-CM   1. Preventative health care  Z00.00     2. Localized osteoporosis without current pathological fracture  M81.6     3. Hyperlipidemia, unspecified hyperlipidemia type  E78.5 CBC with Differential/Platelet    Comprehensive metabolic panel    Lipid panel    4. Vitamin D deficiency  E55.9 Vitamin D (25 hydroxy)    5. Hyperglycemia  R73.9 HgB A1c     Meds ordered this  encounter  Medications   ondansetron (ZOFRAN-ODT) 4 MG disintegrating tablet    Sig: Take 1 tablet (4 mg total) by mouth every 8 (eight) hours as needed for nausea or vomiting.    Dispense:  10 tablet    Refill:  0   Return precautions advised.  Garret Reddish, MD

## 2022-01-06 NOTE — Patient Instructions (Addendum)
Flu shot today  Let us know when you get next shingrix  Please stop by lab before you go If you have mychart- we will send your results within 3 business days of [place] receiving them.  If you do not have mychart- we will call you about results within 5 business days of [place] receiving them.  *please also note that you will see labs on mychart as soon as they post. I will later go in and write notes on them- will say "notes from Dr. Yong Channel"   Recommended follow up: Return in about 6 months (around 07/08/2022) for followup or sooner if needed.Schedule b4 you leave.

## 2022-01-08 ENCOUNTER — Encounter: Payer: Self-pay | Admitting: Family Medicine

## 2022-01-08 ENCOUNTER — Other Ambulatory Visit: Payer: Self-pay

## 2022-01-08 MED ORDER — ROSUVASTATIN CALCIUM 10 MG PO TABS: 10.0000 mg | ORAL_TABLET | ORAL | 3 refills | Status: DC

## 2022-01-10 NOTE — Progress Notes (Unsigned)
[facility] Follow up:    Lori Olp, MD [address] [place] [address]   DIAGNOSIS: Cancer Staging  Malignant neoplasm of lower-inner quadrant of right breast of female, estrogen receptor positive ([place]) Staging form: Breast, AJCC 8th Edition - Clinical stage from 10/22/2020: Stage IA (cT1b, cN0, cM0, G2, ER+, PR+, HER2-) - Signed by Nicholas Lose, MD on 10/22/2020 Stage prefix: Initial diagnosis Histologic grading system: 3 grade system - Pathologic stage from 11/05/2020: Stage IA (pT1b, pN0, cM0, G2, ER+, PR+, HER2-) - Signed by [name], NP on 02/18/2021 Stage prefix: Initial diagnosis Histologic grading system: 3 grade system   SUMMARY OF ONCOLOGIC HISTORY: Oncology History  Malignant neoplasm of lower-inner quadrant of right breast of female, estrogen receptor positive ([place])  10/13/2020 Initial Diagnosis   Screening mammogram showed indeterminate mass in the right breast. Diagnostic mammogram and US showed 1 cm x 1.3 cm suspicious irregular mass at 4:00 4 cm from the nipple. 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   10/22/2020 Cancer Staging   Staging form: Breast, AJCC 8th Edition - Clinical stage from 10/22/2020: Stage IA (cT1b, cN0, cM0, G2, ER+, PR+, HER2-) - Signed by Nicholas Lose, MD on 10/22/2020 Stage prefix: Initial diagnosis Histologic grading system: 3 grade system   11/05/2020 Genetic Testing   Negative hereditary cancer genetic testing: no pathogenic variants detected in [facility] CancerNext-Expanded +RNAinsight Panel.  The report date is November 05, 2020.    The CancerNext-Expanded gene panel offered by [facility] and includes sequencing, rearrangement, and RNA analysis for the following 77 genes: AIP, ALK, APC, ATM, AXIN2, BAP1, BARD1, BLM, BMPR1A, BRCA1, BRCA2, BRIP1, CDC73, CDH1, CDK4, CDKN1B, CDKN2A, CHEK2, CTNNA1, DICER1, FANCC, FH, FLCN, GALNT12, KIF1B, LZTR1, MAX, MEN1, MET,  MLH1, MSH2, MSH3, MSH6, MUTYH, NBN, NF1, NF2, NTHL1, PALB2, PHOX2B, PMS2, POT1, PRKAR1A, PTCH1, PTEN, RAD51C, RAD51D, RB1, RECQL, RET, SDHA, SDHAF2, SDHB, SDHC, SDHD, SMAD4, SMARCA4, SMARCB1, SMARCE1, STK11, SUFU, TMEM127, TP53, TSC1, TSC2, VHL and XRCC2 (sequencing and deletion/duplication); EGFR, EGLN1, HOXB13, KIT, MITF, PDGFRA, POLD1, and POLE (sequencing only); EPCAM and GREM1 (deletion/duplication only).    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, margins negative, ER 95%, PR 80%, HER2 negative, Ki-67 10%   11/05/2020 Cancer Staging   Staging form: Breast, AJCC 8th Edition - Pathologic stage from 11/05/2020: Stage IA (pT1b, pN0, cM0, G2, ER+, PR+, HER2-) - Signed by [name], NP on 02/18/2021 Stage prefix: Initial diagnosis Histologic grading system: 3 grade system   11/2020 -  Anti-estrogen oral therapy   Letrozole daily     CURRENT THERAPY: Letrozole  INTERVAL HISTORY: Lori [name] 81 y.o. female returns for f/u and evaluation of her h/o breast cancer and concerns over bone density and inability to afford prolia copay.  She underwent bone density testing in 03/2021 consistent with osteopenia with a t score of -2.4 in the left femoral neck.    She received prolia on 09/08/2021 and is here today to discuss alternatives because of the high copay of prolia.      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   Imbalance 09/14/2021   Abnormal ear sensation, right 02/27/2021   Genetic testing 11/10/2020   Family history of kidney cancer 10/22/2020   Family history of thyroid cancer 10/22/2020  Family history of multiple myeloma 10/22/2020   Family history of breast cancer 10/22/2020   Malignant neoplasm of lower-inner quadrant of right breast of female, estrogen receptor positive  ([place]) 10/17/2020   Parotid adenoma 06/26/2020   Post-nasal drainage 06/05/2020   Subjective tinnitus of both ears 06/05/2020   Vertigo 01/22/2020   IBS (irritable bowel syndrome) 06/26/2019   Osteoarthritis of left knee 09/15/2017   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   Anosmia 03/30/2017   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   Aortic atherosclerosis ([place]) 02/11/2016   Hypertension 11/29/2013   Eczema 11/29/2013   Hyperlipidemia 11/29/2013   Barrett's esophagus 08/24/2013   GERD (gastroesophageal reflux disease) 05/16/2013   Vitamin D deficiency 01/29/2012   Solitary pulmonary nodule 06/08/2011   HIATAL HERNIA WITH REFLUX 02/06/2010   DEGENERATIVE JOINT DISEASE, KNEE 03/14/2008   VARICOSE VEINS LOWER EXTREMITIES W/INFLAMMATION 03/09/2007   Allergic rhinitis 03/09/2007   ACTINIC KERATOSIS, FOREHEAD, LEFT 03/09/2007   Headache(784.0) 03/09/2007   MENIERE'S DISEASE 09/22/2006   RAYNAUD'S DISEASE 09/22/2006   Osteoporosis 09/22/2006    is allergic to hydrocodone-acetaminophen, azithromycin, bacitracin-polymyxin b, klonopin [clonazepam], neomycin, nitrofurantoin, penicillins, pneumococcal vaccine polyvalent, and pneumovax [pneumococcal polysaccharide vaccine].  MEDICAL HISTORY: Past Medical History:  Diagnosis Date   Allergy    Anemia    past hx of anemia   Aneurysm ([place])    pseudo-aneurym of carotid arteries per pt   Anxiety    Aortic atherosclerosis ([facility])    Arthritis    knee- DJD    Barrett's esophagus    Breast cancer ([place])    right breast IDC   Burning mouth syndrome    Dr Redmond Baseman 11-2016 - no smell or taste x 4 yrs per pt    Cataract    bilateral    Clotting disorder ([place]) 1988   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    Eczema    Family history of breast cancer    Family history of kidney cancer    Family history of multiple myeloma    Family history of thyroid cancer    GERD (gastroesophageal reflux disease)    Headache(784.0)    History of IBS    History of  kidney stones    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    Hyperlipidemia    on medication   Hypertension    Low back pain    Meniere disease    Neuromuscular disorder ([place])    raynaud's   Osteopenia    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[place])    Stroke ([place]) 1988   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 carotid artery dissection    Tubular adenoma of colon 02/2013   Varicose veins with inflammation    upper and lower per pt    Vasculitis ([place])     SURGICAL HISTORY: Past Surgical History:  Procedure Laterality Date   arthroscopic knee  2009   left knee/ torn meniscus   BREAST LUMPECTOMY WITH RADIOACTIVE SEED LOCALIZATION Right 11/05/2020   Procedure: RIGHT BREAST LUMPECTOMY WITH RADIOACTIVE SEED LOCALIZATION;  Surgeon: Erroll Luna, MD;  Location: [facility];  Service: General;  Laterality: Right;   BUNIONECTOMY Right 1998   with other foot surgery    carotid artery disection  1988   Carotid Artery Dissection   CATARACT EXTRACTION, BILATERAL  07-18-2017,08-08-2017   CESAREAN SECTION  1988   1 time   COLONOSCOPY  2019   last 2019   [place] OF UTERUS  2004   EYE SURGERY  EYE SURGERY  09/25/2021   lowere lid of left eye   POLYPECTOMY     POPLITEAL SYNOVIAL CYST EXCISION     left leg   TONSILLECTOMY  1957   UPPER GASTROINTESTINAL ENDOSCOPY     last 2018    SOCIAL HISTORY: Social History   Socioeconomic History   Marital status: Married    Spouse name: Not on file   Number of children: Not on file   Years of education: Not on file   Highest education level: Not on file  Occupational History   Occupation: retired  Tobacco Use   Smoking status: Never   Smokeless tobacco: Never  Substance and Sexual Activity   Alcohol use: No    Alcohol/week: 0.0 standard drinks of alcohol   Drug use: No   Sexual activity: Yes    Birth control/protection: Post-menopausal  Other Topics Concern   Not on file  Social History Narrative   Lives with husband who is  also a patient of Dr. Yong Channel. Alvester Chou [address] 61, Karleen Hampshire (sees Dr. Yong Channel), Judson Roch 12. 3 grandkids in 2022   Social Determinants of Health   Financial Resource Strain: Low Risk  (10/08/2021)   Overall Financial Resource Strain (CARDIA)    Difficulty of Paying Living Expenses: Not hard at all  Food Insecurity: No Food Insecurity (10/08/2021)   Hunger Vital Sign    Worried About Running Out of Food in the Last Year: Never true    Ran Out of Food in the Last Year: Never true  Transportation Needs: No Transportation Needs (10/08/2021)   PRAPARE - Hydrologist (Medical): No    Lack of Transportation (Non-Medical): No  Physical Activity: Inactive (10/08/2021)   Exercise Vital Sign    Days of Exercise per Week: 0 days    Minutes of Exercise per Session: 0 min  Stress: No Stress Concern Present (10/08/2021)   [place]    Feeling of Stress : Not at all  Social Connections: [place] (10/08/2021)   Social Connection and Isolation Panel [NHANES]    Frequency of Communication with Friends and Family: More than three times a week    Frequency of Social Gatherings with Friends and Family: More than three times a week    Attends Religious Services: 1 to 4 times per year    Active Member of [employer] or Organizations: Yes    Attends Archivist Meetings: 1 to 4 times per year    Marital Status: Married  Human resources officer Violence: Not At Risk (10/08/2021)   Humiliation, Afraid, Rape, and Kick questionnaire    Fear of Current or Ex-Partner: No    Emotionally Abused: No    Physically Abused: No    Sexually Abused: No    FAMILY HISTORY: Family History  Problem Relation Age of Onset   Multiple myeloma Father 36   Thyroid cancer Sister 72       s/p removal. papilary and anaplastic.    Lung cancer Sister    Kidney disease Brother        cancer- removed   Kidney cancer Brother        dx  early 72s   Alzheimer's disease Brother    Stroke Brother 26   Breast cancer Cousin 68       paternal first cousin   Cancer Cousin        unknown type, paternal first cousin   Breast cancer  Other        mother's first cousin   Cervical cancer Other    Colon cancer Neg Hx    Esophageal cancer Neg Hx    Rectal cancer Neg Hx    Stomach cancer Neg Hx    Colon polyps Neg Hx     Review of Systems  Constitutional:  Negative for appetite change, chills, fatigue, fever and unexpected weight change.  HENT:   Negative for hearing loss, lump/mass and trouble swallowing.   Eyes:  Negative for eye problems and icterus.  Respiratory:  Negative for chest tightness, cough and shortness of breath.   Cardiovascular:  Negative for chest pain, leg swelling and palpitations.  Gastrointestinal:  Negative for abdominal distention, abdominal pain, constipation, diarrhea, nausea and vomiting.  Endocrine: Negative for hot flashes.  Genitourinary:  Negative for difficulty urinating.   Musculoskeletal:  Negative for arthralgias.  Skin:  Negative for itching and rash.  Neurological:  Negative for dizziness, extremity weakness, headaches and numbness.  Hematological:  Negative for adenopathy. Does not bruise/bleed easily.  Psychiatric/Behavioral:  Negative for depression. The patient is not nervous/anxious.       PHYSICAL EXAMINATION  ECOG PERFORMANCE STATUS: 1 - Symptomatic but completely ambulatory  There were no vitals filed for this visit.  Physical Exam Constitutional:      General: She is not in acute distress.    Appearance: Normal appearance. She is not toxic-appearing.  HENT:     Head: Normocephalic and atraumatic.  Eyes:     General: No scleral icterus. Cardiovascular:     Rate and Rhythm: Normal rate and regular rhythm.     Pulses: Normal pulses.     Heart sounds: Normal heart sounds.  Pulmonary:     Effort: Pulmonary effort is normal.     Breath sounds: Normal breath sounds.   Abdominal:     General: Abdomen is flat. Bowel sounds are normal. There is no distension.     Palpations: Abdomen is soft.     Tenderness: There is no abdominal tenderness.  Musculoskeletal:        General: No swelling.     Cervical back: Neck supple.  Lymphadenopathy:     Cervical: No cervical adenopathy.  Skin:    General: Skin is warm and dry.     Findings: No rash.  Neurological:     General: No focal deficit present.     Mental Status: She is alert.  Psychiatric:        Mood and Affect: Mood normal.        Behavior: Behavior normal.     LABORATORY DATA: None for this visit     ASSESSMENT and THERAPY PLAN:   No problem-specific Assessment & Plan notes found for this encounter.   All questions were answered. The patient knows to call the clinic with any problems, questions or concerns. We can certainly see the patient much sooner if necessary.  Total encounter time:*** minutes*in face-to-face visit time, chart review, lab review, care coordination, order entry, and documentation of the encounter time.  Wilber Bihari, NP 01/10/22 4:07 PM Medical Oncology and Hematology [facility] [place], [place] [address] Tel. [phone]    Fax. [phone]  *Total Encounter Time as defined by the Centers for Medicare and Medicaid Services includes, in addition to the face-to-face time of a patient visit (documented in the note above) non-face-to-face time: obtaining and reviewing outside history, ordering and reviewing medications, tests or procedures, care coordination (communications  with other health care professionals or caregivers) and documentation in the medical record.

## 2022-01-11 ENCOUNTER — Encounter: Payer: Self-pay | Admitting: Adult Health

## 2022-01-11 ENCOUNTER — Inpatient Hospital Stay: Payer: Medicare Other | Attending: Adult Health | Admitting: Adult Health

## 2022-01-11 ENCOUNTER — Other Ambulatory Visit: Payer: Self-pay

## 2022-01-11 VITALS — BP 129/64 | HR 64 | Temp 97.9°F | Resp 16 | Ht 66.0 in | Wt 119.4 lb

## 2022-01-11 DIAGNOSIS — Z8051 Family history of malignant neoplasm of kidney: Secondary | ICD-10-CM | POA: Diagnosis not present

## 2022-01-11 DIAGNOSIS — Z79811 Long term (current) use of aromatase inhibitors: Secondary | ICD-10-CM | POA: Diagnosis not present

## 2022-01-11 DIAGNOSIS — Z803 Family history of malignant neoplasm of breast: Secondary | ICD-10-CM | POA: Insufficient documentation

## 2022-01-11 DIAGNOSIS — C50311 Malignant neoplasm of lower-inner quadrant of right female breast: Secondary | ICD-10-CM

## 2022-01-11 DIAGNOSIS — Z170 Estrogen receptor positive status [ER+]: Secondary | ICD-10-CM | POA: Diagnosis not present

## 2022-01-11 DIAGNOSIS — Z801 Family history of malignant neoplasm of trachea, bronchus and lung: Secondary | ICD-10-CM | POA: Insufficient documentation

## 2022-01-11 DIAGNOSIS — Z808 Family history of malignant neoplasm of other organs or systems: Secondary | ICD-10-CM | POA: Diagnosis not present

## 2022-01-11 DIAGNOSIS — I10 Essential (primary) hypertension: Secondary | ICD-10-CM | POA: Diagnosis not present

## 2022-01-11 NOTE — Assessment & Plan Note (Signed)
Lori [name] is a 81 year old woman with history of stage Ia estrogen positive breast cancer diagnosed in August 2022 status post lumpectomy and currently taking letrozole daily which began in September 2022.  She continues on the letrozole daily with good tolerance.  She does not wish to take any more Prolia due to the co-pay of around $300.  She is ineligible to take oral bisphosphonate therapy due to the risk of esophagitis with these medications and her history of Barrett's esophagus.  Since her bone density showed a T score of -2.4 which is the number just before getting to osteoporosis I recommended that she optimize her calcium intake, weightbearing exercises and also take vitamin D.  She verbalized understanding of this and assures that she is going to do so.  She will return in June 2024 for labs and follow-up with Dr. Lindi Adie.

## 2022-01-14 ENCOUNTER — Encounter: Payer: Self-pay | Admitting: Family Medicine

## 2022-01-19 ENCOUNTER — Other Ambulatory Visit: Payer: Self-pay | Admitting: Family Medicine

## 2022-01-19 DIAGNOSIS — H02055 Trichiasis without entropian left lower eyelid: Secondary | ICD-10-CM | POA: Diagnosis not present

## 2022-02-05 ENCOUNTER — Other Ambulatory Visit: Payer: Self-pay | Admitting: Family Medicine

## 2022-02-22 ENCOUNTER — Encounter: Payer: Self-pay | Admitting: Family Medicine

## 2022-03-02 DIAGNOSIS — H02055 Trichiasis without entropian left lower eyelid: Secondary | ICD-10-CM | POA: Diagnosis not present

## 2022-03-04 ENCOUNTER — Encounter: Payer: Self-pay | Admitting: Family Medicine

## 2022-03-04 ENCOUNTER — Ambulatory Visit (INDEPENDENT_AMBULATORY_CARE_PROVIDER_SITE_OTHER): Payer: Medicare Other | Admitting: Family Medicine

## 2022-03-04 VITALS — BP 150/60 | HR 72 | Temp 97.8°F | Ht 66.0 in | Wt 120.2 lb

## 2022-03-04 DIAGNOSIS — R5383 Other fatigue: Secondary | ICD-10-CM

## 2022-03-04 DIAGNOSIS — I10 Essential (primary) hypertension: Secondary | ICD-10-CM | POA: Diagnosis not present

## 2022-03-04 DIAGNOSIS — M2550 Pain in unspecified joint: Secondary | ICD-10-CM

## 2022-03-04 LAB — CBC WITH DIFFERENTIAL/PLATELET
Basophils Absolute: 0 10*3/uL (ref 0.0–0.1)
Basophils Relative: 0.4 % (ref 0.0–3.0)
Eosinophils Absolute: 0.2 10*3/uL (ref 0.0–0.7)
Eosinophils Relative: 3.2 % (ref 0.0–5.0)
HCT: 38.4 % (ref 36.0–46.0)
Hemoglobin: 12.6 g/dL (ref 12.0–15.0)
Lymphocytes Relative: 33.6 % (ref 12.0–46.0)
Lymphs Abs: 2.2 10*3/uL (ref 0.7–4.0)
MCHC: 32.9 g/dL (ref 30.0–36.0)
MCV: 87.1 fl (ref 78.0–100.0)
Monocytes Absolute: 0.8 10*3/uL (ref 0.1–1.0)
Monocytes Relative: 12.3 % — ABNORMAL HIGH (ref 3.0–12.0)
Neutro Abs: 3.3 10*3/uL (ref 1.4–7.7)
Neutrophils Relative %: 50.5 % (ref 43.0–77.0)
Platelets: 260 10*3/uL (ref 150.0–400.0)
RBC: 4.41 Mil/uL (ref 3.87–5.11)
RDW: 14.2 % (ref 11.5–15.5)
WBC: 6.5 10*3/uL (ref 4.0–10.5)

## 2022-03-04 LAB — COMPREHENSIVE METABOLIC PANEL
ALT: 13 U/L (ref 0–35)
AST: 17 U/L (ref 0–37)
Albumin: 4.5 g/dL (ref 3.5–5.2)
Alkaline Phosphatase: 56 U/L (ref 39–117)
BUN: 25 mg/dL — ABNORMAL HIGH (ref 6–23)
CO2: 30 mEq/L (ref 19–32)
Calcium: 9.9 mg/dL (ref 8.4–10.5)
Chloride: 106 mEq/L (ref 96–112)
Creatinine, Ser: 0.66 mg/dL (ref 0.40–1.20)
GFR: 83.1 mL/min (ref >60.00)
Glucose, Bld: 88 mg/dL (ref 70–99)
Potassium: 5.5 mEq/L — ABNORMAL HIGH (ref 3.5–5.1)
Sodium: 143 mEq/L (ref 135–145)
Total Bilirubin: 0.5 mg/dL (ref 0.2–1.2)
Total Protein: 7.9 g/dL (ref 6.0–8.3)

## 2022-03-04 LAB — TSH: TSH: 1.01 u[IU]/mL (ref 0.35–5.50)

## 2022-03-04 LAB — CK: Total CK: 162 U/L (ref 7–177)

## 2022-03-04 NOTE — Progress Notes (Signed)
Subjective:     Patient ID: Lori [name], female    DOB: 11-04-1940, 81 y.o.   MRN: [id number]  Chief Complaint  Patient presents with   Fatigue   Chills    Sx started about 1 month ago, think it maybe side effects from medication   Hot Flashes    HPI  Fatigue.  Chills for 1 mo.  Hot flashes.  Rosuvastatin just increased to 3x/wk.  Femara for >1 yr.  Has not done covid booster yet.   On 12/2-friend visited and then tested + for covid.  No symptoms and pt tested self on 12/7 and neg.  Had annual w/Dr. Yong Channel in Oct and inc rosuvastatin.  Not as active as in past d/t balance(did rehab).   Stiff intermitt all day-bad knees but worse.  Using cane. Gets really "cold" at times and temp 95.6-drank hot tea and 97.  Usu running 96's.  Hot flashes at times.  No cough.  No sob. No fever.not dizzy.  Occ back spasms HTN-bp 120's/60's.  Hasn't checked in about 2 wks.   There are no preventive care reminders to display for this patient.  Past Medical History:  Diagnosis Date   Allergy    Anemia    past hx of anemia   Aneurysm ([place])    pseudo-aneurym of carotid arteries per pt   Anxiety    Aortic atherosclerosis ([facility])    Arthritis    knee- DJD    Barrett's esophagus    Breast cancer ([place])    right breast IDC   Burning mouth syndrome    Dr Redmond Baseman 11-2016 - no smell or taste x 4 yrs per pt    Cataract    bilateral    Clotting disorder ([place]) 1988   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    Eczema    Family history of breast cancer    Family history of kidney cancer    Family history of multiple myeloma    Family history of thyroid cancer    GERD (gastroesophageal reflux disease)    Headache(784.0)    History of IBS    History of kidney stones    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    Hyperlipidemia    on medication   Hypertension    Low back pain    Meniere disease    Neuromuscular disorder ([place])    raynaud's   Osteopenia    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[place])     Stroke ([place]) 1988   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 carotid artery dissection    Tubular adenoma of colon 02/2013   Varicose veins with inflammation    upper and lower per pt    Vasculitis [facility])     Past Surgical History:  Procedure Laterality Date   arthroscopic knee  2009   left knee/ torn meniscus   BREAST LUMPECTOMY WITH RADIOACTIVE SEED LOCALIZATION Right 11/05/2020   Procedure: RIGHT BREAST LUMPECTOMY WITH RADIOACTIVE SEED LOCALIZATION;  Surgeon: Erroll Luna, MD;  Location: [facility];  Service: General;  Laterality: Right;   BUNIONECTOMY Right 1998   with other foot surgery    carotid artery disection  1988   Carotid Artery Dissection   CATARACT EXTRACTION, BILATERAL  07-18-2017,08-08-2017   CESAREAN SECTION  1988   1 time   COLONOSCOPY  2019   last 2019   [place] OF UTERUS  2004   EYE SURGERY     EYE SURGERY  09/25/2021  lowere lid of left eye   POLYPECTOMY     POPLITEAL SYNOVIAL CYST EXCISION     left leg   TONSILLECTOMY  1957   UPPER GASTROINTESTINAL ENDOSCOPY     last 2018    Outpatient Medications Prior to Visit  Medication Sig Dispense Refill   amLODipine (NORVASC) 2.5 MG tablet TAKE 1 TABLET BY MOUTH EVERY DAY 90 tablet 3   aspirin 81 MG tablet Take 81 mg by mouth daily. Evening     cyanocobalamin 1000 MCG tablet Take 1,000 mcg by mouth once a week.     letrozole (FEMARA) 2.5 MG tablet TAKE 1 TABLET BY MOUTH EVERY DAY 90 tablet 3   loratadine (CLARITIN) 10 MG tablet Take 10 mg by mouth daily. Take 1/2 tablet daily     metoprolol tartrate (LOPRESSOR) 50 MG tablet TAKE 1/2 TABLET BY MOUTH 2 TIMES DAILY 90 tablet 1   montelukast (SINGULAIR) 10 MG tablet TAKE 1 TABLET BY MOUTH AT BEDTIME 90 tablet 1   ondansetron (ZOFRAN-ODT) 4 MG disintegrating tablet Take 1 tablet (4 mg total) by mouth every 8 (eight) hours as needed for nausea or vomiting. 10 tablet 0   pantoprazole (PROTONIX) 40 MG tablet TAKE 1 TABLET BY MOUTH EVERY DAY 90 tablet 1    phenazopyridine (PYRIDIUM) 100 MG tablet Take 1 tablet (100 mg total) by mouth 3 (three) times daily as needed for pain. Do not use longer than 2 days total. 10 tablet 0   rosuvastatin (CRESTOR) 10 MG tablet Take 1 tablet (10 mg total) by mouth 3 (three) times a week. 39 tablet 3   No facility-administered medications prior to visit.    Allergies  Allergen Reactions   Hydrocodone-Acetaminophen     VOMITING   Azithromycin Other (See Comments)    Thrush    Bacitracin-Polymyxin B Itching    Burning and runny eye    Klonopin [Clonazepam]     -august 27th- took one dose and experienced zombie like sensation- states will never take agian   Neomycin    Nitrofurantoin     Reports tingling on tongue- no swelling   Penicillins     REACTION: Arm swelling   Pneumococcal Vaccine Polyvalent     ARM REDNESS WITH TENDERNESS   Pneumovax [Pneumococcal Polysaccharide Vaccine]    ROS neg/noncontributory except as noted HPI/below      Objective:     BP (!) 150/60 (BP Location: Left Arm, Patient Position: Sitting, Cuff Size: Normal)   Pulse 72   Temp 97.8 F (36.6 C) (Temporal)   Ht _0  (1.676 m)   Wt 120 lb 4 oz (54.5 kg)   SpO2 97%   BMI 19.41 kg/m  Wt Readings from Last 3 Encounters:  03/04/22 120 lb 4 oz (54.5 kg)  01/11/22 119 lb 6.4 oz (54.2 kg)  01/06/22 117 lb 9.6 oz (53.3 kg)    Physical Exam   Gen: WDWN NAD HEENT: NCAT, conjunctiva not injected, sclera nonicteric TM WNL B, OP moist, no exudates  NECK:  supple, no thyromegaly, no nodes, no carotid bruits CARDIAC: RRR, S1S2+, no murmur. DP 2+B LUNGS: CTAB. No wheezes ABDOMEN:  BS+, soft, NTND, No HSM, no masses EXT:  no edema MSK: no gross abnormalities.  NEURO: A&O x3.  CN II-XII intact.  PSYCH: normal mood. Good eye contact     Assessment & Plan:   Problem List Items Addressed This Visit       Cardiovascular and Mediastinum   Hypertension   Other Visit  Diagnoses     Other fatigue    -  Primary    Relevant Orders   Comprehensive metabolic panel   TSH   CK   CBC with Differential/Platelet   Polyarthralgia       Relevant Orders   CK      Fatigue-hot flashes, chills.  Suspect poss the inc rosuvastatin may have inc femara as well and getting SE.   May be some other illness as well so will check cbc,cmp,tsh, ck.  Will hold rosuvastatin 2-3 wks and if no better, let us know(or worse).  If better, then restart at 1/2 tab 3x/wk and touch base w/Dr. Yong Channel.    Polyarthralgia-poss from increase in rosuvastatin to 3x/wk.  Check CK.  See above plan HTN-chronic.  Elevated today(hasn't checked at home for few wks either).  Not sure if anxiety, other.  She will do some home checks and let us know.    No orders of the defined types were placed in this encounter.   Wellington Hampshire, MD

## 2022-03-04 NOTE — Progress Notes (Signed)
Labs ok except the potassium is high-which may be a problem with out lab.  Please repeat potassium soon

## 2022-03-04 NOTE — Patient Instructions (Addendum)
Happy Birthday!!! 58!!! Happy Holidays!!!  Stop rosuvastatin for 2-3 weeks and see if feeling better.  If better, Then restart at 1/2 tablet 3x/wk and see how doing  Monitor blood pressures

## 2022-03-05 ENCOUNTER — Telehealth: Payer: Self-pay | Admitting: Family Medicine

## 2022-03-05 ENCOUNTER — Ambulatory Visit: Payer: Medicare Other

## 2022-03-05 ENCOUNTER — Other Ambulatory Visit: Payer: Self-pay | Admitting: *Deleted

## 2022-03-05 ENCOUNTER — Other Ambulatory Visit: Payer: Medicare Other

## 2022-03-05 DIAGNOSIS — E875 Hyperkalemia: Secondary | ICD-10-CM | POA: Diagnosis not present

## 2022-03-05 NOTE — Addendum Note (Signed)
Addended by: Loura Back on: 03/05/2022 03:04 PM   Modules accepted: Orders

## 2022-03-06 LAB — POTASSIUM: Potassium: 4.4 mmol/L (ref 3.5–5.3)

## 2022-03-09 ENCOUNTER — Encounter: Payer: Self-pay | Admitting: Family Medicine

## 2022-03-26 ENCOUNTER — Encounter: Payer: Self-pay | Admitting: Family Medicine

## 2022-03-29 ENCOUNTER — Ambulatory Visit: Payer: Medicare Other | Admitting: Family Medicine

## 2022-03-29 ENCOUNTER — Ambulatory Visit: Payer: Medicare Other | Admitting: Internal Medicine

## 2022-04-07 ENCOUNTER — Encounter: Payer: Self-pay | Admitting: Family Medicine

## 2022-04-07 ENCOUNTER — Ambulatory Visit (INDEPENDENT_AMBULATORY_CARE_PROVIDER_SITE_OTHER): Payer: Medicare Other | Admitting: Family Medicine

## 2022-04-07 VITALS — BP 132/62 | HR 67 | Temp 97.4°F | Ht 66.0 in | Wt 117.2 lb

## 2022-04-07 DIAGNOSIS — I10 Essential (primary) hypertension: Secondary | ICD-10-CM

## 2022-04-07 DIAGNOSIS — E785 Hyperlipidemia, unspecified: Secondary | ICD-10-CM

## 2022-04-07 DIAGNOSIS — I700 Atherosclerosis of aorta: Secondary | ICD-10-CM | POA: Diagnosis not present

## 2022-04-07 DIAGNOSIS — R001 Bradycardia, unspecified: Secondary | ICD-10-CM | POA: Diagnosis not present

## 2022-04-07 NOTE — Assessment & Plan Note (Signed)
#  Hypertension #Bradycardia S:compliant with amlodipine 2.'5mg'$ , metoprolol 25 mg (half a 50 mg tablet) BID- but holding  Unfortunately heart rate had been noted as low as the 30s and then by January 8 was running in the 40s and we recommended holding and if heart rate was below 60.  Blood pressure has trended up since holding the metoprolol and heart rate still on the lower side 46-49 at home. Has noted less fatigue BP Readings from Last 3 Encounters:  04/07/22 132/62  03/04/22 (!) 150/60  01/11/22 129/64    A/P:  blood pressure reasonably well controlled for age on just amlodipine 2.5 mg (she stopped metoprolol due to bradycardia/low heart rate and we opted to permanently discontinue). If she needs other medicine will consider increasing amlodipine or an alternate

## 2022-04-07 NOTE — Assessment & Plan Note (Signed)
#  Hyperlipidemia/aortic atherosclerosis #history of stroke on imaging- remote- on asa 81 mg  S:medication: rosuvastatin 10 mg 3x a week -prior fenofbrate  LDL goal at least under 100 and triglyceride goal under 200.   From recent MyChart message "Dr. Cherlynn Kaiser wanted me to touch base with you regarding Rosuvastatin.  I stopped the med on 12/16.  I will start back in two weeks half pills 3 times a week.   After stopping, within in a day or two, I feel so much better.  It's like night and day!" -She later reported with starting back on 03/20/2022 at 3 days a week with 1/2 tablet felt achy, sore knees, very tired and out of breath at times with some anxiety.  Noted her pulse running as low as the 30s (we are not sure about waistline).  On January 8 she reports still feeling fatigue-also felt like acid reflux was bothering her and allergies contributing to not feeling well overall.  Of note blood pressures slightly elevated and pulse slightly low-see above Lab Results  Component Value Date   CHOL 189 01/06/2022   HDL 80.20 01/06/2022   LDLCALC 96 01/06/2022   LDLDIRECT 74.0 02/06/2016   TRIG 65.0 01/06/2022   CHOLHDL 2 01/06/2022   A/P: with aortic atherosclerosis (presumed stable) and prior stroke on imaging- we discussed prefer LDL under 70- on her last lipid panel with LDL 96 we increased to 3 days a week rosuvastatin 10 mg but she did not do well with this or taking 5 mg 3 days a week. She has felt better off medicine- she is going to take another week off then retrial once a week- if this doesn't work in addition to fine tuning diet/exercise we may need to look at alternate statin like atorvastatin

## 2022-04-07 NOTE — Progress Notes (Signed)
Phone [phone] In person visit   Subjective:   Lori [name] is a 82 y.o. 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    Priority: Medium    Hypertension 11/29/2013    Priority: Medium    Hyperlipidemia 11/29/2013    Priority: Medium    Barrett's esophagus 08/24/2013    Priority: Medium    Allergic rhinitis 03/09/2007    Priority: Medium    Osteoporosis 09/22/2006    Priority: Medium    IBS (irritable bowel syndrome) 06/26/2019    Priority: Low   Aortic atherosclerosis ([place]) 02/11/2016    Priority: Low   Eczema 11/29/2013    Priority: Low   GERD (gastroesophageal reflux disease) 05/16/2013    Priority: Low   Vitamin D deficiency 01/29/2012    Priority: Low   Solitary pulmonary nodule 06/08/2011    Priority: Low   HIATAL HERNIA WITH REFLUX 02/06/2010    Priority: Low   DEGENERATIVE JOINT DISEASE, KNEE 03/14/2008    Priority: Low   VARICOSE VEINS LOWER EXTREMITIES W/INFLAMMATION 03/09/2007    Priority: Low   ACTINIC KERATOSIS, FOREHEAD, LEFT 03/09/2007    Priority: Low   Headache(784.0) 03/09/2007    Priority: Low   MENIERE'S DISEASE 09/22/2006    Priority: Low   RAYNAUD'S DISEASE 09/22/2006    Priority: Low   Abnormal ear sensation, right 02/27/2021    Priority: 1.   Family history of kidney cancer 10/22/2020    Priority: 1.   Family history of thyroid cancer 10/22/2020    Priority: 1.    Family history of multiple myeloma 10/22/2020    Priority: 1.   Family history of breast cancer 10/22/2020    Priority: 1.   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 Evening     cyanocobalamin 1000 MCG tablet Take 1,000 mcg by mouth once a week.     letrozole (FEMARA) 2.5 MG tablet TAKE 1 TABLET BY MOUTH EVERY DAY 90 tablet 3   loratadine (CLARITIN) 10 MG tablet Take 10 mg by mouth daily. Take 1/2 tablet daily     montelukast (SINGULAIR) 10 MG tablet TAKE 1 TABLET BY MOUTH AT BEDTIME 90 tablet 1   pantoprazole (PROTONIX) 40 MG tablet TAKE 1 TABLET BY MOUTH EVERY DAY 90 tablet 1   phenazopyridine (PYRIDIUM) 100 MG tablet Take 1 tablet (100 mg total) by mouth 3 (three) times daily as needed  for pain. Do not use longer than 2 days total. 10 tablet 0   metoprolol tartrate (LOPRESSOR) 50 MG tablet TAKE 1/2 TABLET BY MOUTH 2 TIMES DAILY (Patient not taking: Reported on 04/07/2022) 90 tablet 1   ondansetron (ZOFRAN-ODT) 4 MG disintegrating tablet Take 1 tablet (4 mg total) by mouth every 8 (eight) hours as needed for nausea or vomiting. (Patient not taking: Reported on 04/07/2022) 10 tablet 0   rosuvastatin (CRESTOR) 10 MG tablet Take 1 tablet (10 mg total) by mouth 3 (three) times a week. (Patient not taking: Reported on 04/07/2022) 39 tablet 3   No current facility-administered medications for this visit.     Objective:  BP 132/62 Comment: most recent home reading from yesterdayat 5: 15 PM  Pulse 67   Temp  (!) 97.4 F (36.3 C)   Ht '5\' 6"'$  (1.676 m)   Wt 117 lb 3.2 oz (53.2 kg)   SpO2 97%   BMI 18.92 kg/m  Gen: NAD, resting comfortably CV: RRR no murmurs rubs or gallops Lungs: CTAB no crackles, wheeze, rhonchi Ext: no edema Skin: warm, dry    Assessment and Plan   # Hypertension #Bradycardia S:compliant with amlodipine 2.'5mg'$ , metoprolol 25 mg (half a 50 mg tablet) BID- but holding  Unfortunately heart rate had been noted as low as the 30s and then by January 8 was running in the 40s and we recommended holding and if heart rate was below 60.  Blood pressure has trended up since holding the metoprolol and heart rate still on the lower side 46-49 at home. Has noted less fatigue BP Readings from Last 3 Encounters:  04/07/22 132/62  03/04/22 (!) 150/60  01/11/22 129/64    A/P:  blood pressure reasonably well controlled for age on just amlodipine 2.5 mg (she stopped metoprolol due to bradycardia/low heart rate and we opted to permanently discontinue). If she needs other medicine will consider increasing amlodipine or an alternate  # Hyperlipidemia/aortic atherosclerosis #history of stroke on imaging- remote- on asa 81 mg  S:medication: rosuvastatin 10 mg 3x a week -prior fenofbrate  LDL goal at least under 100 and triglyceride goal under 200.   From recent MyChart message "Dr. Cherlynn Kaiser wanted me to touch base with you regarding Rosuvastatin.  I stopped the med on 12/16.  I will start back in two weeks half pills 3 times a week.   After stopping, within in a day or two, I feel so much better.  It's like night and day!" -She later reported with starting back on 03/20/2022 at 3 days a week with 1/2 tablet felt achy, sore knees, very tired and out of breath at times with some anxiety.  Noted her pulse running as low as the 30s (we are not sure about waistline).  On January 8 she reports still feeling fatigue-also felt like acid reflux was bothering her and allergies contributing to not feeling well  overall.  Of note blood pressures slightly elevated and pulse slightly low-see above Lab Results  Component Value Date   CHOL 189 01/06/2022   HDL 80.20 01/06/2022   LDLCALC 96 01/06/2022   LDLDIRECT 74.0 02/06/2016   TRIG 65.0 01/06/2022   CHOLHDL 2 01/06/2022   A/P: with aortic atherosclerosis (presumed stable) and prior stroke on imaging- we discussed prefer LDL under 70- on her last lipid panel with LDL 96 we increased to 3 days a week rosuvastatin 10 mg but she did not do well with this or taking 5 mg 3 days a  week. She has felt better off medicine- she is going to take another week off then retrial once a week- if this doesn't work in addition to fine tuning diet/exercise we may need to look at alternate statin like atorvastatin    #thoracic back pain- has seen Dr. Georgina Snell in back- wnts to make sure ok to try tylenol or tylenol arthritis- reasonable  Recommended follow up: Return for next already scheduled visit or sooner if needed. Future Appointments  Date Time Provider [place]  06/21/2022  1:00 PM Edythe Clarity, [place] [facility] None  07/08/2022 10:00 AM [name], MD [facility]  08/23/2022  9:30 AM [facility] None  08/23/2022 10:00 AM Nicholas Lose, MD [facility] None  10/14/2022  2:45 PM [facility] HEALTH COACH [facility]    Lab/Order associations:   ICD-10-CM   1. Primary hypertension  I10     2. Bradycardia  R00.1     3. Hyperlipidemia, unspecified hyperlipidemia type  E78.5     4. Aortic atherosclerosis (H[facility]) Chronic I70.0       No orders of the defined types were placed in this encounter.   Return precautions advised.  Garret Reddish, MD

## 2022-04-07 NOTE — Patient Instructions (Addendum)
blood pressure reasonably well controlled for age on just amlodipine 2.5 mg (she stopped metoprolol due to bradycardia/low heart rate and we opted to permanently discontinue). If she needs other medicine will consider increasing amlodipine or an alternate  with aortic atherosclerosis (presumed stable) and prior stroke on imaging- we discussed prefer LDL under 70- on her last lipid panel with LDL 96 we increased to 3 days a week rosuvastatin 10 mg but she did not do well with this or taking 5 mg 3 days a week. She has felt better off medicine- she is going to take another week off then retrial once a week- if this doesn't work in addition to fine tuning diet/exercise we may need to look at alternate statin like atorvastatin    Recommended follow up: Return for next already scheduled visit or sooner if needed.

## 2022-04-22 DIAGNOSIS — H02055 Trichiasis without entropian left lower eyelid: Secondary | ICD-10-CM | POA: Diagnosis not present

## 2022-04-23 ENCOUNTER — Encounter: Payer: Self-pay | Admitting: Family Medicine

## 2022-04-23 ENCOUNTER — Ambulatory Visit (INDEPENDENT_AMBULATORY_CARE_PROVIDER_SITE_OTHER): Payer: Medicare Other | Admitting: Family Medicine

## 2022-04-23 VITALS — BP 138/78 | HR 79 | Temp 97.3°F | Ht 66.0 in | Wt 119.2 lb

## 2022-04-23 DIAGNOSIS — I10 Essential (primary) hypertension: Secondary | ICD-10-CM

## 2022-04-23 DIAGNOSIS — Z170 Estrogen receptor positive status [ER+]: Secondary | ICD-10-CM | POA: Diagnosis not present

## 2022-04-23 DIAGNOSIS — C50311 Malignant neoplasm of lower-inner quadrant of right female breast: Secondary | ICD-10-CM

## 2022-04-23 DIAGNOSIS — E785 Hyperlipidemia, unspecified: Secondary | ICD-10-CM

## 2022-04-23 NOTE — Patient Instructions (Addendum)
Take half a metoprolol 12.5 mg if blood pressure is over 150 and HR is over 70  Continue current cholesterol medicine- recheck at follow up   Recommended follow up: Return for next already scheduled visit or sooner if needed.

## 2022-04-23 NOTE — Progress Notes (Signed)
Phone [phone] In person visit   Subjective:   Lori [name] is a 82 y.o. year old very pleasant female patient who presents for/with See problem oriented charting Chief Complaint  Patient presents with   Follow-up    Pt states hr is back up after being off meds.   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    Priority: Medium    Hypertension 11/29/2013    Priority: Medium    Hyperlipidemia 11/29/2013    Priority: Medium    Barrett's esophagus 08/24/2013    Priority: Medium    Allergic rhinitis 03/09/2007    Priority: Medium    Osteoporosis 09/22/2006    Priority: Medium    IBS (irritable bowel syndrome) 06/26/2019    Priority: Low   Aortic atherosclerosis ([place]) 02/11/2016    Priority: Low   Eczema 11/29/2013    Priority: Low   GERD (gastroesophageal reflux disease) 05/16/2013    Priority: Low   Vitamin D deficiency 01/29/2012    Priority: Low   Solitary pulmonary nodule 06/08/2011    Priority: Low   HIATAL HERNIA WITH REFLUX 02/06/2010    Priority: Low   DEGENERATIVE JOINT DISEASE, KNEE 03/14/2008    Priority: Low   VARICOSE VEINS LOWER EXTREMITIES W/INFLAMMATION 03/09/2007    Priority: Low   ACTINIC KERATOSIS, FOREHEAD, LEFT 03/09/2007    Priority: Low   Headache(784.0) 03/09/2007    Priority: Low   MENIERE'S DISEASE 09/22/2006    Priority: Low   RAYNAUD'S DISEASE 09/22/2006    Priority: Low   Abnormal ear sensation, right 02/27/2021    Priority: 1.   Family history of kidney cancer 10/22/2020    Priority: 1.   Family history  of thyroid cancer 10/22/2020    Priority: 1.   Family history of multiple myeloma 10/22/2020    Priority: 1.   Family history of breast cancer 10/22/2020    Priority: 1.   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 Evening     cyanocobalamin 1000 MCG tablet Take 1,000 mcg by mouth once a week.     letrozole (FEMARA) 2.5 MG tablet TAKE 1 TABLET BY MOUTH EVERY DAY 90 tablet 3   loratadine (CLARITIN) 10 MG tablet Take 10 mg by mouth daily. Take 1/2 tablet daily     montelukast (SINGULAIR) 10 MG tablet TAKE 1 TABLET BY MOUTH AT BEDTIME 90 tablet 1   ondansetron (ZOFRAN-ODT) 4 MG disintegrating tablet Take 1 tablet (4 mg total) by mouth every 8 (eight) hours as needed for nausea or vomiting.  10 tablet 0   pantoprazole (PROTONIX) 40 MG tablet TAKE 1 TABLET BY MOUTH EVERY DAY 90 tablet 1   rosuvastatin (CRESTOR) 10 MG tablet Take 1 tablet (10 mg total) by mouth 3 (three) times a week. 39 tablet 3   phenazopyridine (PYRIDIUM) 100 MG tablet Take 1 tablet (100 mg total) by mouth 3 (three) times daily as needed for pain. Do not use longer than 2 days total. (Patient not taking: Reported on 04/23/2022) 10 tablet 0   No current facility-administered medications for this visit.     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  Patient with right lumpectomy 11/05/2020 with negative margins thankfully.  As a result she does not require adjuvant radiation.  She will be on letrozole daily for 5 years.  She is following with Dr. Lindi Adie -ongoing side effects from letrozole but she wants to stay on this and discuss Dr. Lindi Adie next visit  #HM- plans to get covid shot in a couple weeks- wants to feel better after recently feeling off in late january  # Hypertension # Bradycardia- resolved off of medication S:compliant with amlodipine 2.'5mg'$  -Had bradycardia on metoprolol 25 mg BID.  Last visit  avg 137.1667 98.33825  Over last 6 readings. Felt poorly she thinks related to letrozole a few days prior and got over 150s. HR 60s to 70s for most part. Gets fair amount of chilled sensation on mediation and can bother her- plans to follow up with Dr. Lindi Adie BP Readings from Last 3 Encounters:  04/23/22 138/78  04/07/22 132/62  03/04/22 (!) 150/60   A/P:  blood pressure high acceptable range but thankful no significant bradycardia off of metoprolol- gave option of increasing amlodipine to lower risks further and she reports  wants to minimize medicine- we opted for -  Take half a metoprolol 12.5 mg if blood pressure is over 150 and HR is over 70  # Hyperlipidemia #history of stroke on imaging- remote- on asa 81 mg  S:medication: Rosuvastatin 10 mg once weekly- starting on 25th with repeat last night after dinner  -Felt poorly on rosuvastatin 10 mg 3x a week -prior fenofbrate  LDL goal at least under 100 and triglyceride goal under 200.  Lab Results  Component Value Date   CHOL 189 01/06/2022   HDL 80.20 01/06/2022   LDLCALC 96 01/06/2022   LDLDIRECT 74.0 02/06/2016   TRIG 65.0 01/06/2022    CHOLHDL 2 01/06/2022  A/P: lipids likely mildly high but on max tolerable dose at moment- will continue to try to titrate up slowly  - check in april  Recommended follow up: Return for next already scheduled visit or sooner if needed. Future Appointments  Date Time Provider [place]  06/21/2022  1:00 PM Edythe Clarity, [facility] None  07/08/2022 10:00 AM [name], MD [facility]  08/23/2022  9:30 AM [facility] None  08/23/2022 10:00 AM Nicholas Lose, MD [facility] None  10/14/2022  2:45 PM [facility] HEALTH COACH  [facility]    Lab/Order associations:   ICD-10-CM   1. Primary hypertension  I10     2. Hyperlipidemia, unspecified hyperlipidemia type  E78.5     3. Malignant neoplasm of lower-inner quadrant of right breast of female, estrogen receptor positive ([place]) Chronic C50.311    Z17.0       No orders of the defined types were placed in this encounter.   Return precautions advised.  Garret Reddish, MD

## 2022-04-28 DIAGNOSIS — L570 Actinic keratosis: Secondary | ICD-10-CM | POA: Diagnosis not present

## 2022-04-28 DIAGNOSIS — L814 Other melanin hyperpigmentation: Secondary | ICD-10-CM | POA: Diagnosis not present

## 2022-04-28 DIAGNOSIS — D692 Other nonthrombocytopenic purpura: Secondary | ICD-10-CM | POA: Diagnosis not present

## 2022-04-28 DIAGNOSIS — L821 Other seborrheic keratosis: Secondary | ICD-10-CM | POA: Diagnosis not present

## 2022-04-28 DIAGNOSIS — D1801 Hemangioma of skin and subcutaneous tissue: Secondary | ICD-10-CM | POA: Diagnosis not present

## 2022-04-28 DIAGNOSIS — D225 Melanocytic nevi of trunk: Secondary | ICD-10-CM | POA: Diagnosis not present

## 2022-05-03 ENCOUNTER — Encounter: Payer: Self-pay | Admitting: Family Medicine

## 2022-05-04 MED ORDER — METOPROLOL TARTRATE 25 MG PO TABS: 12.5000 mg | ORAL_TABLET | Freq: Two times a day (BID) | ORAL | 5 refills | Status: DC | PRN

## 2022-05-11 ENCOUNTER — Encounter: Payer: Self-pay | Admitting: Family Medicine

## 2022-05-11 ENCOUNTER — Ambulatory Visit (INDEPENDENT_AMBULATORY_CARE_PROVIDER_SITE_OTHER): Payer: Medicare Other | Admitting: Family Medicine

## 2022-05-11 VITALS — BP 129/69 | HR 79 | Temp 97.2°F | Ht 66.0 in | Wt 116.2 lb

## 2022-05-11 DIAGNOSIS — R5383 Other fatigue: Secondary | ICD-10-CM

## 2022-05-11 DIAGNOSIS — I10 Essential (primary) hypertension: Secondary | ICD-10-CM | POA: Diagnosis not present

## 2022-05-11 DIAGNOSIS — E785 Hyperlipidemia, unspecified: Secondary | ICD-10-CM | POA: Diagnosis not present

## 2022-05-11 NOTE — Progress Notes (Signed)
Phone [phone] In person visit   Subjective:   Lori [name] is a 82 y.o. year old very pleasant female patient who presents for/with See problem oriented charting Chief Complaint  Patient presents with   Epistaxis    Pt c/o bloody noses that happens at random times of day. Denies nasal congestion that has been happening daily and started 2-3 weeks ago with sores in her nostrils.   Fatigue    Pt states she sleeps 8hrs but still wakes up fatigued and she thinks it may be coming from her medicines or thinks she may be anemic.   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    Priority: Medium    Hypertension 11/29/2013    Priority: Medium    Hyperlipidemia 11/29/2013    Priority: Medium    Barrett's esophagus 08/24/2013    Priority: Medium    Allergic rhinitis 03/09/2007    Priority: Medium    Osteoporosis 09/22/2006    Priority: Medium    IBS (irritable bowel syndrome) 06/26/2019    Priority: Low   Aortic atherosclerosis ([place]) 02/11/2016    Priority: Low   Eczema 11/29/2013    Priority: Low   GERD (gastroesophageal reflux disease) 05/16/2013    Priority: Low   Vitamin D deficiency 01/29/2012    Priority: Low   Solitary pulmonary nodule 06/08/2011    Priority: Low   HIATAL HERNIA WITH REFLUX 02/06/2010    Priority: Low   DEGENERATIVE JOINT DISEASE, KNEE 03/14/2008    Priority: Low   VARICOSE VEINS LOWER EXTREMITIES W/INFLAMMATION 03/09/2007    Priority: Low   ACTINIC KERATOSIS, FOREHEAD, LEFT 03/09/2007    Priority: Low   Headache(784.0) 03/09/2007     Priority: Low   MENIERE'S DISEASE 09/22/2006    Priority: Low   RAYNAUD'S DISEASE 09/22/2006    Priority: Low   Abnormal ear sensation, right 02/27/2021    Priority: 1.   Family history of kidney cancer 10/22/2020    Priority: 1.   Family history of thyroid cancer 10/22/2020    Priority: 1.   Family history of multiple myeloma 10/22/2020    Priority: 1.   Family history of breast cancer 10/22/2020    Priority: 1.   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 Evening     cyanocobalamin 1000 MCG tablet Take 1,000 mcg by mouth once a week.     letrozole (FEMARA) 2.5 MG tablet TAKE 1 TABLET BY MOUTH EVERY DAY 90 tablet 3   loratadine (CLARITIN) 10 MG tablet Take 10 mg by mouth daily. Take  1/2 tablet daily     metoprolol tartrate (LOPRESSOR) 25 MG tablet Take 0.5 tablets (12.5 mg total) by mouth 2 (two) times daily as needed (if systolic blood pressure is over 150 and HR is also over 70). 60 tablet 5   montelukast (SINGULAIR) 10 MG tablet TAKE 1 TABLET BY MOUTH AT BEDTIME 90 tablet 1   ondansetron (ZOFRAN-ODT) 4 MG disintegrating tablet Take 1 tablet (4 mg total) by mouth every 8 (eight) hours as needed for nausea or vomiting. 10 tablet 0   pantoprazole (PROTONIX) 40 MG tablet TAKE 1 TABLET BY MOUTH EVERY DAY 90 tablet 1   phenazopyridine (PYRIDIUM) 100 MG tablet Take 1 tablet (100 mg total) by mouth 3 (three) times daily as needed for pain. Do not use longer than 2 days total. 10 tablet 0   rosuvastatin  (CRESTOR) 10 MG tablet Take 1 tablet (10 mg total) by mouth 3 (three) times a week. 39 tablet 3   No current facility-administered medications for this visit.     Objective:  BP 129/69 Comment: most recent home reading  Pulse 79   Temp (!) 97.2 F (36.2 C)   Ht 5' 6"$  (1.676 m)   Wt 116 lb 3.2 oz (52.7 kg)   SpO2 98%   BMI 18.76 kg/m  Gen: NAD, resting comfortably Nasal turbinates bilaterally with some dried as well as pressure blood but no active bleeding source noted CV: RRR no murmurs rubs or gallops Lungs: CTAB no crackles, wheeze, rhonchi Ext: Trace edema Skin: warm, dry    Assessment and Plan   # Epistaxis S: Patient complains of bloody nose at random times of the day starting 2 to 3 weeks ago.  She has felt some sore spots in her nostrils that seem to be the source of the bleeding.  Can happen randomly during the day. Symptoms started on February 1st- I saw her on 2nd but had only an episode or two and didn't mention. Does blow her nose some (encouraged to minmize) - Nasal sprays:no flonase or other nasal sprays  - denies nasal congestion -bloody noses years ago but only lasted a day or two -taking baby aspirin A/P: I do see some dried blood and more recent blood in bilateral nares but with no active bleeding.  There is a somewhat irritated area on the lateral side of left nostrils but she has had a lot of sensitivity to even nasal saline in the past so we did not if she could tolerate cautery here with silver nitrate - We ultimately opted to hold aspirin (history of stroke on prior neuroimaging but I think we can hold the short-term), have her stop blowing her nose and if not improving within 2 weeks or if has more significant bleed that she cannot stop to call Dr. Redmond Baseman for follow-up  # Fatigue S: Patient sleeps 8 hours each night but still wakes up fatigued.  She is concerned could be related to her medications (worries about letrozole - aches and chills with this- and  rosuvastatin- feel more tired on days she takes this) or even possibly anemia- none on December labs. Fatigue issues stated in last few weeks. Feels like could have started with starting back rosuvastatin once a week down from 3x a week.  - snores some but not regularly  A/P: She wants to make sure she is not anemic from the epistaxis which I suspect is unlikely but we agreed to check CBC, CMP, TSH - She is consistent with vitamin  D so I do not think that is the cause - No significant snoring and since this is more acute I doubt it is related to sleep apnea-will hold off on sleep study at this time - I do think she is trying to do a lot in her household as her husband has been able to do less and less with his overall health-we discussed taking a step back in next week at least - Recommended increasing sleep to 9 to 10 hours a night to see if that helps - She also feels cholesterol medicine could be the cause-see hyperlipidemia below.  She is also concerned about letrozole contributing to fatigue but wants to discuss that with oncology which is appropriate  # Hypertension S:compliant with amlodipine 2.30m -Metoprolol 25 mg BID led to bradycardia.  Now only takes 12.5 mg BID prn if BP >150 and HR over 70 A/P: Controlled. Continue current medications.  Has not needed metoprolol very much  # Hyperlipidemia/aortic atherosclerosis #history of stroke on imaging- remote- on asa 81 mg  S:medication: Rosuvastatin 10 mg once weekly but feels she has been more fatigued since restarting this -Felt poorly on rosuvastatin 10 mg 3x a week A/P: Cholesterol suspect poorly controlled but feels she is having side effects on even once a week rosuvastatin-we are going to trial off for a month and then restart if she can journal her symptoms in regards to fatigue.  If there seems to be strong correlation-could look at alternate medication such as Zetia or even bempedoic acid  Recommended follow up: Return for next  already scheduled visit or sooner if needed. Future Appointments  Date Time Provider [place] 05/12/2022 11:30 AM [facility]  06/21/2022  1:00 PM DEdythe Clarity [facility] None  07/08/2022 10:00 AM HMarin Olp MD [facility]  08/23/2022  9:30 AM [facility] None  08/23/2022 10:00 AM GNicholas Lose MD [facility] None  10/14/2022  2:45 PM [facility] HEALTH COACH [facility]    Lab/Order associations:   ICD-10-CM   1. Other fatigue  R53.83 CBC with Differential/Platelet    Comprehensive metabolic panel    TSH    CANCELED: POC COVID-19    2. Primary hypertension  I10     3. Hyperlipidemia, unspecified hyperlipidemia type  E78.5       No orders of the defined types were placed in this encounter.   Return precautions advised.  SGarret Reddish MD

## 2022-05-11 NOTE — Patient Instructions (Addendum)
Schedule to come back for bloodwork  If no cause on bloodwork for fatigue lets trial back off of rosuvastatin for a month and journal your symptoms. We will restart at that point and see if you get similar side effects (if they resolve completely)- the experiment is to see if they are truly associated  Hold aspirin for 2 weeks and restart at that point. If nosebleeds do not resolve within several days I agree with seeing Dr. Sophronia Simas term lets have you listen to your body Increase sleep to 9-10 hours a night preferably by going to bed earlier I want you to rest with household activities for 5-7 days- try to reduce your load by up to 50% per day I think stress could be playing a role in fatigue as well as your body trying to deal with the blood noses and stress of that and medication stress  Recommended follow up: Return for next already scheduled visit or sooner if needed.

## 2022-05-12 ENCOUNTER — Other Ambulatory Visit (INDEPENDENT_AMBULATORY_CARE_PROVIDER_SITE_OTHER): Payer: Medicare Other

## 2022-05-12 DIAGNOSIS — R5383 Other fatigue: Secondary | ICD-10-CM

## 2022-05-12 LAB — CBC WITH DIFFERENTIAL/PLATELET
Basophils Absolute: 0 10*3/uL (ref 0.0–0.1)
Basophils Relative: 0.7 % (ref 0.0–3.0)
Eosinophils Absolute: 0.2 10*3/uL (ref 0.0–0.7)
Eosinophils Relative: 4.5 % (ref 0.0–5.0)
HCT: 38.4 % (ref 36.0–46.0)
Hemoglobin: 12.6 g/dL (ref 12.0–15.0)
Lymphocytes Relative: 36.1 % (ref 12.0–46.0)
Lymphs Abs: 1.9 10*3/uL (ref 0.7–4.0)
MCHC: 32.7 g/dL (ref 30.0–36.0)
MCV: 87 fl (ref 78.0–100.0)
Monocytes Absolute: 0.6 10*3/uL (ref 0.1–1.0)
Monocytes Relative: 11.2 % (ref 3.0–12.0)
Neutro Abs: 2.5 10*3/uL (ref 1.4–7.7)
Neutrophils Relative %: 47.5 % (ref 43.0–77.0)
Platelets: 248 10*3/uL (ref 150.0–400.0)
RBC: 4.41 Mil/uL (ref 3.87–5.11)
RDW: 14.1 % (ref 11.5–15.5)
WBC: 5.3 10*3/uL (ref 4.0–10.5)

## 2022-05-12 LAB — TSH: TSH: 1.05 u[IU]/mL (ref 0.35–5.50)

## 2022-05-13 ENCOUNTER — Encounter: Payer: Self-pay | Admitting: Family Medicine

## 2022-05-13 LAB — COMPREHENSIVE METABOLIC PANEL
ALT: 14 U/L (ref 0–35)
AST: 21 U/L (ref 0–37)
Albumin: 4.3 g/dL (ref 3.5–5.2)
Alkaline Phosphatase: 67 U/L (ref 39–117)
BUN: 24 mg/dL — ABNORMAL HIGH (ref 6–23)
CO2: 24 mEq/L (ref 19–32)
Calcium: 10.1 mg/dL (ref 8.4–10.5)
Chloride: 104 mEq/L (ref 96–112)
Creatinine, Ser: 0.7 mg/dL (ref 0.40–1.20)
GFR: 81.82 mL/min (ref >60.00)
Glucose, Bld: 120 mg/dL — ABNORMAL HIGH (ref 70–99)
Potassium: 3.8 mEq/L (ref 3.5–5.1)
Sodium: 141 mEq/L (ref 135–145)
Total Bilirubin: 0.4 mg/dL (ref 0.2–1.2)
Total Protein: 7.4 g/dL (ref 6.0–8.3)

## 2022-05-24 ENCOUNTER — Other Ambulatory Visit: Payer: Self-pay | Admitting: Family Medicine

## 2022-05-25 ENCOUNTER — Encounter: Payer: Self-pay | Admitting: Family Medicine

## 2022-05-25 DIAGNOSIS — H6123 Impacted cerumen, bilateral: Secondary | ICD-10-CM | POA: Diagnosis not present

## 2022-05-27 ENCOUNTER — Encounter: Payer: Self-pay | Admitting: Family Medicine

## 2022-05-28 ENCOUNTER — Encounter: Payer: Self-pay | Admitting: Family Medicine

## 2022-05-28 ENCOUNTER — Ambulatory Visit (INDEPENDENT_AMBULATORY_CARE_PROVIDER_SITE_OTHER): Payer: Medicare Other | Admitting: Family Medicine

## 2022-05-28 VITALS — BP 154/76 | HR 64 | Temp 97.0°F | Ht 66.0 in | Wt 117.6 lb

## 2022-05-28 DIAGNOSIS — I10 Essential (primary) hypertension: Secondary | ICD-10-CM

## 2022-05-28 MED ORDER — AMLODIPINE BESYLATE 5 MG PO TABS: 5.0000 mg | ORAL_TABLET | Freq: Every day | ORAL | 5 refills | Status: DC

## 2022-05-28 NOTE — Patient Instructions (Addendum)
Poor control of blood pressure- increase amlodipine to 5 mg (can take two of her 2.5 mg pills or pick up new prescription) and update me in 2 weeks -can also still use half tablet of metoprolol if needed if BP >150 and HR over 70  Recommended follow up: Return in about 2 weeks (around 06/11/2022) for followup or sooner if needed.Schedule b4 you leave. -for front desk can use same day or virtual slot (but she will be coming in person)

## 2022-05-28 NOTE — Progress Notes (Signed)
Phone [phone] In person visit   Subjective:   Lori [name] is a 82 y.o. 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    Priority: Medium    Aortic atherosclerosis ([place]) 02/11/2016    Priority: Medium    Hypertension 11/29/2013    Priority: Medium    Hyperlipidemia 11/29/2013    Priority: Medium    Barrett's esophagus 08/24/2013    Priority: Medium    Allergic rhinitis 03/09/2007    Priority: Medium    Osteoporosis 09/22/2006    Priority: Medium    IBS (irritable bowel syndrome) 06/26/2019    Priority: Low   Eczema 11/29/2013    Priority: Low   GERD (gastroesophageal reflux disease) 05/16/2013    Priority: Low   Vitamin D deficiency 01/29/2012    Priority: Low   Solitary pulmonary nodule 06/08/2011    Priority: Low   HIATAL HERNIA WITH REFLUX 02/06/2010    Priority: Low   DEGENERATIVE JOINT DISEASE, KNEE 03/14/2008    Priority: Low   VARICOSE VEINS LOWER EXTREMITIES W/INFLAMMATION 03/09/2007    Priority: Low   ACTINIC KERATOSIS, FOREHEAD, LEFT 03/09/2007    Priority: Low   Headache(784.0) 03/09/2007    Priority: Low   MENIERE'S DISEASE 09/22/2006    Priority: Low   RAYNAUD'S DISEASE 09/22/2006    Priority: Low   Abnormal ear sensation, right 02/27/2021    Priority: 1.   Family history of kidney cancer 10/22/2020    Priority: 1.   Family history of thyroid cancer 10/22/2020    Priority: 1.   Family  history of multiple myeloma 10/22/2020    Priority: 1.   Family history of breast cancer 10/22/2020    Priority: 1.   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 30 tablet 5   aspirin 81 MG tablet Take 81 mg by mouth daily. Evening     cyanocobalamin 1000 MCG tablet Take 1,000 mcg by mouth once a week.     letrozole (FEMARA) 2.5 MG tablet TAKE 1 TABLET BY MOUTH EVERY DAY 90 tablet 3   loratadine (CLARITIN) 10 MG tablet Take 10 mg by mouth daily. Take 1/2 tablet daily     metoprolol tartrate (LOPRESSOR) 25 MG tablet Take 0.5 tablets (12.5 mg total) by mouth 2 (two) times daily as needed (if systolic blood pressure is over 150 and HR is also over 70). 60 tablet 5   montelukast (SINGULAIR) 10 MG tablet TAKE 1 TABLET BY MOUTH NIGHTLY AT BEDTIME 90 tablet  1   ondansetron (ZOFRAN-ODT) 4 MG disintegrating tablet Take 1 tablet (4 mg total) by mouth every 8 (eight) hours as needed for nausea or vomiting. 10 tablet 0   pantoprazole (PROTONIX) 40 MG tablet TAKE 1 TABLET BY MOUTH EVERY DAY 90 tablet 1   phenazopyridine (PYRIDIUM) 100 MG tablet Take 1 tablet (100 mg total) by mouth 3 (three) times daily as needed for pain. Do not use longer than 2 days total. 10 tablet 0   rosuvastatin (CRESTOR) 10 MG tablet Take 1 tablet (10 mg total) by mouth 3 (three) times a week. 39 tablet 3   No current facility-administered medications for this visit.     Objective:  BP (!) 154/76   Pulse 64   Temp (!) 97 F (36.1 C)   Ht '5\' 6"'$  (1.676 m)   Wt 117 lb 9.6 oz (53.3 kg)   SpO2 94%    BMI 18.98 kg/m  Gen: NAD, resting comfortably CV: RRR no murmurs rubs or gallops Lungs: CTAB no crackles, wheeze, rhonchi Abdomen: soft/nontender/nondistended/normal bowel sounds. No rebound or guarding.  Ext: trace edema Skin: warm, dry     Assessment and Plan   # Hypertension S:compliant with amlodipine 2.'5mg'$ . denies heavy salt intake but home BP seems to be trending up -Metoprolol 25 mg BID led to bradycardia.  Now only takes 12.5 mg BID prn if BP >150 and HR over 70- but has essentially needed this twice a day. Feels more tired and sleepy lately and some lightheadedness noted (feels some of this related to letrozole)- blood pressure 164/69 and pulse 72- took half tablet this morning.  -did have short term episodes feeling presyncopal that have not recurred- see mychart messages- was 5 seconds and resolved- BP and HR ok. Discussed follow up if recurs and consider further workup BP Readings from Last 3 Encounters:  05/28/22 (!) 154/76  05/11/22 129/69  04/23/22 138/78  A/P:  Poor control of blood pressure- increase amlodipine to 5 mg (can take two of her 2.5 mg pills or pick up new prescription) and update me in 2 weeks -can also still use half tablet of metoprolol if needed if BP >150 and HR over 70  Recommended follow up: Return in about 2 weeks (around 06/11/2022) for followup or sooner if needed.Schedule b4 you leave. Future Appointments  Date Time Provider [place]  06/21/2022  1:00 PM Edythe Clarity, [place] [facility] None  07/08/2022 10:00 AM [name], MD [facility]  08/23/2022  9:30 AM [facility] None  08/23/2022 10:00 AM Nicholas Lose, MD [facility] None  10/14/2022  2:45 PM [facility] HEALTH COACH [facility]   Lab/Order associations:   ICD-10-CM   1. Primary hypertension  I10       Meds ordered this encounter  Medications   amLODipine (NORVASC) 5 MG tablet    Sig: Take 1 tablet (5 mg total) by mouth daily.    Dispense:  30 tablet     Refill:  5    Return precautions advised.  Garret Reddish, MD

## 2022-05-31 ENCOUNTER — Encounter: Payer: Self-pay | Admitting: Family Medicine

## 2022-06-03 DIAGNOSIS — H02055 Trichiasis without entropian left lower eyelid: Secondary | ICD-10-CM | POA: Diagnosis not present

## 2022-06-04 ENCOUNTER — Ambulatory Visit: Payer: Medicare Other | Admitting: Family Medicine

## 2022-06-11 ENCOUNTER — Encounter: Payer: Self-pay | Admitting: Family Medicine

## 2022-06-11 ENCOUNTER — Ambulatory Visit (INDEPENDENT_AMBULATORY_CARE_PROVIDER_SITE_OTHER): Payer: Medicare Other | Admitting: Family Medicine

## 2022-06-11 VITALS — BP 120/62 | HR 76 | Temp 97.1°F | Ht 66.0 in | Wt 117.8 lb

## 2022-06-11 DIAGNOSIS — I700 Atherosclerosis of aorta: Secondary | ICD-10-CM

## 2022-06-11 DIAGNOSIS — E785 Hyperlipidemia, unspecified: Secondary | ICD-10-CM | POA: Diagnosis not present

## 2022-06-11 DIAGNOSIS — I10 Essential (primary) hypertension: Secondary | ICD-10-CM

## 2022-06-11 NOTE — Progress Notes (Signed)
Phone [phone] In person visit   Subjective:   Lori [name] is a 82 y.o. year old very pleasant female patient who presents for/with See problem oriented charting Chief Complaint  Patient presents with   Follow-up    Pt states she is feeling fine but sometimes the feeling bad comes and goes.   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    Priority: Medium    Aortic atherosclerosis ([place]) 02/11/2016    Priority: Medium    Hypertension 11/29/2013    Priority: Medium    Hyperlipidemia 11/29/2013    Priority: Medium    Barrett's esophagus 08/24/2013    Priority: Medium    Allergic rhinitis 03/09/2007    Priority: Medium    Osteoporosis 09/22/2006    Priority: Medium    IBS (irritable bowel syndrome) 06/26/2019    Priority: Low   Eczema 11/29/2013    Priority: Low   GERD (gastroesophageal reflux disease) 05/16/2013    Priority: Low   Vitamin D deficiency 01/29/2012    Priority: Low   Solitary pulmonary nodule 06/08/2011    Priority: Low   HIATAL HERNIA WITH REFLUX 02/06/2010    Priority: Low   DEGENERATIVE JOINT DISEASE, KNEE 03/14/2008    Priority: Low   VARICOSE VEINS LOWER EXTREMITIES W/INFLAMMATION 03/09/2007    Priority: Low   ACTINIC KERATOSIS, FOREHEAD, LEFT 03/09/2007    Priority: Low   Headache(784.0) 03/09/2007    Priority: Low   MENIERE'S DISEASE 09/22/2006    Priority: Low   RAYNAUD'S DISEASE 09/22/2006    Priority: Low   Abnormal ear sensation, right 02/27/2021    Priority: 1.   Family history of kidney cancer  10/22/2020    Priority: 1.   Family history of thyroid cancer 10/22/2020    Priority: 1.   Family history of multiple myeloma 10/22/2020    Priority: 1.   Family history of breast cancer 10/22/2020    Priority: 1.   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 30 tablet 5   aspirin 81 MG tablet Take 81 mg by mouth daily. Evening     cyanocobalamin 1000 MCG tablet Take 1,000 mcg by mouth once a week.     letrozole (FEMARA) 2.5 MG tablet TAKE 1 TABLET BY MOUTH EVERY DAY 90 tablet 3   loratadine (CLARITIN) 10 MG tablet Take 10 mg by mouth daily. Take 1/2 tablet daily     metoprolol tartrate (LOPRESSOR) 25 MG tablet Take 0.5 tablets (12.5 mg total) by mouth 2 (two) times daily as needed (if systolic blood pressure is over 150 and HR is also over 70).  60 tablet 5   montelukast (SINGULAIR) 10 MG tablet TAKE 1 TABLET BY MOUTH NIGHTLY AT BEDTIME 90 tablet 1   pantoprazole (PROTONIX) 40 MG tablet TAKE 1 TABLET BY MOUTH EVERY DAY 90 tablet 1   rosuvastatin (CRESTOR) 10 MG tablet Take 1 tablet (10 mg total) by mouth 3 (three) times a week. (Patient taking differently: Take 10 mg by mouth once a week.) 39 tablet 3   ondansetron (ZOFRAN-ODT) 4 MG disintegrating tablet Take 1 tablet (4 mg total) by mouth every 8 (eight) hours as needed for nausea or vomiting. (Patient not taking: Reported on 06/11/2022) 10 tablet 0   phenazopyridine (PYRIDIUM) 100 MG tablet Take 1 tablet (100 mg total) by mouth 3 (three) times daily as needed for pain. Do not use longer than 2 days total.  (Patient not taking: Reported on 06/11/2022) 10 tablet 0   No current facility-administered medications for this visit.     Objective:  BP 120/62   Pulse 76   Temp (!) 97.1 F (36.2 C)   Ht 5\' 6"  (1.676 m)   Wt 117 lb 12.8 oz (53.4 kg)   SpO2 97%   BMI 19.01 kg/m  Gen: NAD, resting comfortably CV: RRR no murmurs rubs or gallops Lungs: CTAB no crackles, wheeze, rhonchi  Ext: trace edema Skin: warm, dry    Assessment and Plan   # Hypertension S:compliant with amlodipine 2.5mg --> 5 mg. Had dizziness at first but improved.  -Metoprolol 25 mg BID led to bradycardia.  Now only takes 12.5 mg BID prn if BP >150 and HR over 70 Home readings 110s to 140s with average probably high 120s or low 130s- occasionally slighlty higher BP Readings from Last 3 Encounters:  06/11/22 120/62  05/28/22 (!) 154/76  05/11/22 129/69  A/P:  much improved- continue current medications   # Hyperlipidemia/aortic atherosclerosis #history of stroke on imaging- remote- on asa 81 mg  S:medication: Rosuvastatin 10 mg once weekly- in February we stopped for a month and felt fewer myalgias  overall -Felt poorly on rosuvastatin 10 mg 3x a week previously A/P: lipids likely poorly controlled with hx of stroke on imagaing - we discussed retrialing rosuvastatin just 5 mg (half of 10 mg tablet) once a week and update me in 3 weeks or so Aortic atherosclerosis (presumed stable)- LDL goal ideally <70 - suspect LDL above goal for now- can recheck if can get back on statin or trial alternate like zetia or bempedoci acid.   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 Still feels off balance in general even if not having vertigo- continue to monitor . Hx of stroke could have contributed on imaging.   Recommended follow up: No follow-ups on file. Future Appointments  Date Time Provider [place]  06/21/2022  1:00 PM Edythe Clarity, [place] [facility] None  07/08/2022 10:00 AM [name], MD [facility]  08/23/2022  9:30 AM [facility] None  08/23/2022 10:00 AM Nicholas Lose, MD [facility] None  10/14/2022  2:45 PM [facility] ANNUAL WELLNESS VISIT 1 [facility]    Lab/Order associations:   ICD-10-CM   1. Primary hypertension  I10     2. Hyperlipidemia, unspecified hyperlipidemia type  E78.5     3. Aortic atherosclerosis ([facility])  I70.0       No orders of the defined types were placed in this encounter.   Return precautions advised.  Garret Reddish,  MD

## 2022-06-11 NOTE — Patient Instructions (Addendum)
-   we discussed retrialing rosuvastatin just 5 mg (half of 10 mg tablet) once a week and update me in 3 weeks or so   -blood pressure looks great overall! Continue current medications   Recommended follow up: Return in about 4 months (around 10/11/2022) for followup or sooner if needed.Schedule b4 you leave. Or 3 months -can cancel visit with me on April 18th

## 2022-06-21 ENCOUNTER — Ambulatory Visit: Payer: Medicare Other | Admitting: Pharmacist

## 2022-06-21 NOTE — Progress Notes (Signed)
Care Management & Coordination Services Pharmacy Note  06/21/2022 Name:  Lori [name] MRN:  [id number] DOB:  May 09, 1940  Summary: PharmD FU visit.  She had not started lower dose of statin as she was trialing off to see if this was causing tired symptoms.  She plans to start this decreased dose tonight.  Also tolerating increased amlodipine fine with no dizziness.  Does have some tired spells and one BP 120/46 at home.  Counseled her to monitor and record BP - contact us if dizziness or sleepiness worsens.  Recommendations/Changes made from today's visit: Cma to fu in 2-3 weeks on BP  Follow up plan: FU with me 4 months   Subjective: Lori [name] is an 82 y.o. year old female who is a primary patient of Lori [name], Brayton Mars, MD.  The care coordination team was consulted for assistance with disease management and care coordination needs.    Engaged with patient by telephone for follow up visit.  Recent office visits:  11/25/2021 OV (PCP) [name], MD; no medication changes noted.   11/02/2021 OV (Fam Med) [name], MD; Start tessalon/benzonatate and claritin for your cough.   10/22/2021 OV (PCP) [name], MD; Meclizine has made her feel tired in the past- will d/c.   06/15/2021 OV (Fam Med) [name], MD; no medication changes noted.   Recent consult visits:  12/04/2021 OV (Gen Surgery) Cornett, Beryle Lathe, MD; no medication changes indicated.   09/14/2021 IC (Otolaryngology) [name], MD; no medication changes indicated.   08/19/2021 OV (Oncology) Nicholas Lose, MD; I recommended that she start bisphosphonate therapy and we discussed different options and because of her history of Barrett's esophagus, I recommended injection Prolia every 6 months.   Hospital visits:  None in previous 6 months   Objective:  Lab Results  Component Value Date   CREATININE 0.70 05/12/2022   BUN 24 (H) 05/12/2022   GFR 81.82 05/12/2022    GFRNONAA >60 09/08/2021   GFRAA 99 12/27/2019   NA 141 05/12/2022   K 3.8 05/12/2022   CALCIUM 10.1 05/12/2022   CO2 24 05/12/2022   GLUCOSE 120 (H) 05/12/2022    Lab Results  Component Value Date/Time   HGBA1C 6.2 01/06/2022 10:24 AM   HGBA1C 6.2 05/20/2021 11:40 AM   GFR 81.82 05/12/2022 11:04 AM   GFR 83.10 03/04/2022 09:18 AM    Last diabetic Eye exam: No results found for: "HMDIABEYEEXA"  Last diabetic Foot exam: No results found for: "HMDIABFOOTEX"   Lab Results  Component Value Date   CHOL 189 01/06/2022   HDL 80.20 01/06/2022   LDLCALC 96 01/06/2022   LDLDIRECT 74.0 02/06/2016   TRIG 65.0 01/06/2022   CHOLHDL 2 01/06/2022       Latest Ref Rng & Units 05/12/2022   11:04 AM 03/04/2022    9:18 AM 01/06/2022   10:24 AM  Hepatic Function  Total Protein 6.0 - 8.3 g/dL 7.4  7.9  7.9   Albumin 3.5 - 5.2 g/dL 4.3  4.5  4.3   AST 0 - 37 U/L 21  17  21    ALT 0 - 35 U/L 14  13  17    Alk Phosphatase 39 - 117 U/L 67  56  49   Total Bilirubin 0.2 - 1.2 mg/dL 0.4  0.5  0.3     Lab Results  Component Value Date/Time   TSH 1.05 05/12/2022 11:04 AM   TSH 1.01 03/04/2022 09:18 AM  FREET4 1.19 11/30/2013 02:20 PM       Latest Ref Rng & Units 05/12/2022   11:04 AM 03/04/2022    9:18 AM 01/06/2022   10:24 AM  CBC  WBC 4.0 - 10.5 K/uL 5.3  6.5  6.4   Hemoglobin 12.0 - 15.0 g/dL 12.6  12.6  12.2   Hematocrit 36.0 - 46.0 % 38.4  38.4  37.7   Platelets 150.0 - 400.0 K/uL 248.0  260.0  254.0     Lab Results  Component Value Date/Time   VD25OH 34.93 01/06/2022 10:24 AM   VD25OH 40.07 01/02/2021 11:31 AM   VITAMINB12 705 05/20/2021 11:40 AM   VITAMINB12 384 06/26/2019 09:39 AM    Clinical ASCVD: Yes  The ASCVD Risk score (Arnett DK, et al., 2019) failed to calculate for the following reasons:   The 2019 ASCVD risk score is only valid for ages 65 to 33   The patient has a prior MI or stroke diagnosis        04/23/2022    9:12 AM 10/22/2021   11:39 AM 10/08/2021     2:39 PM  Depression screen PHQ 2/9  Decreased Interest 0 0 0  Down, Depressed, Hopeless 0 0 0  PHQ - 2 Score 0 0 0  Altered sleeping 0 0   Tired, decreased energy 3 1   Change in appetite 0 0   Feeling bad or failure about yourself  0 0   Trouble concentrating 0 0   Moving slowly or fidgety/restless 0 0   Suicidal thoughts 0 0   PHQ-9 Score 3 1   Difficult doing work/chores Somewhat difficult Not difficult at all      Social History   Tobacco Use  Smoking Status Never  Smokeless Tobacco Never   BP Readings from Last 3 Encounters:  06/11/22 120/62  05/28/22 (!) 154/76  05/11/22 129/69   Pulse Readings from Last 3 Encounters:  06/11/22 76  05/28/22 64  05/11/22 79   Wt Readings from Last 3 Encounters:  06/11/22 117 lb 12.8 oz (53.4 kg)  05/28/22 117 lb 9.6 oz (53.3 kg)  05/11/22 116 lb 3.2 oz (52.7 kg)   BMI Readings from Last 3 Encounters:  06/11/22 19.01 kg/m  05/28/22 18.98 kg/m  05/11/22 18.76 kg/m    Allergies  Allergen Reactions   Hydrocodone-Acetaminophen     VOMITING   Azithromycin Other (See Comments)    Thrush    Bacitracin-Polymyxin B Itching    Burning and runny eye    Klonopin [Clonazepam]     -august 27th- took one dose and experienced zombie like sensation- states will never take agian   Neomycin    Nitrofurantoin     Reports tingling on tongue- no swelling   Penicillins     REACTION: Arm swelling   Pneumococcal Vaccine Polyvalent     ARM REDNESS WITH TENDERNESS   Pneumovax [Pneumococcal Polysaccharide Vaccine]     Medications Reviewed Today     Reviewed by Edythe Clarity, RPH (Pharmacist) on 06/21/22 at 1444  Med List Status: <None>   Medication Order Taking? Sig Documenting Provider Last Dose Status Informant  amLODipine (NORVASC) 5 MG tablet [id number] No Take 1 tablet (5 mg total) by mouth daily. [name], MD Taking Active   aspirin 81 MG tablet [id number] No Take 81 mg by mouth daily. Evening [provider]  Taking Active Self  cyanocobalamin 1000 MCG tablet [id number] No Take 1,000 mcg by mouth once a week. [provider] Taking Active            Med Note (Edris Friedt L   Fri Jun 12, 2021 11:23 AM) Only taking once weekly!  letrozole (FEMARA) 2.5 MG tablet [id number] No TAKE 1 TABLET BY MOUTH EVERY DAY Nicholas Lose, MD Taking Active   loratadine (CLARITIN) 10 MG tablet [id number] No Take 10 mg by mouth daily. Take 1/2 tablet daily [provider] Taking Active            Med Note Vassie Moselle S   Thu Jun 26, 2020  9:30 AM) Taking 1/2 tablet daily  metoprolol tartrate (LOPRESSOR) 25 MG tablet [id number] No Take 0.5 tablets (12.5 mg total) by mouth 2 (two) times daily as needed (if systolic blood pressure is over 150 and HR is also over 70). [name], MD Taking Active   montelukast (SINGULAIR) 10 MG tablet [id number] No TAKE 1 TABLET BY MOUTH NIGHTLY AT BEDTIME [name], MD Taking Active   ondansetron (ZOFRAN-ODT) 4 MG disintegrating tablet [id number] No Take 1 tablet (4 mg total) by mouth every 8 (eight) hours as needed for nausea or vomiting.  Patient not taking: Reported on 06/11/2022   [name], MD Not Taking Active   pantoprazole (PROTONIX) 40 MG tablet [id number] No TAKE 1 TABLET BY MOUTH EVERY DAY [name], MD Taking Active   phenazopyridine (PYRIDIUM) 100 MG tablet [id number] No Take 1 tablet (100 mg total) by mouth 3 (three) times daily as needed for pain. Do not use longer than 2 days total.  Patient not taking: Reported on 06/11/2022   Allwardt, Randa Evens, PA-C Not Taking Active   rosuvastatin (CRESTOR) 10 MG tablet [id number] No Take 1 tablet (10 mg total) by mouth 3 (three) times a week.  Patient taking differently: Take 10 mg by mouth once a week.   [name], MD Taking Active             SDOH:  (Social Determinants of Health) assessments and interventions performed: No, done within year Financial Resource Strain: Low Risk   (10/08/2021)   Overall Financial Resource Strain (CARDIA)    Difficulty of Paying Living Expenses: Not hard at all   Food Insecurity: No Food Insecurity (10/08/2021)   Hunger Vital Sign    Worried About Running Out of Food in the Last Year: Never true    Ran Out of Food in the Last Year: Never true    SDOH Interventions    [facility] Office Visit from 04/23/2022 in [place] from 10/22/2020 in [place] at [facility]  SDOH Interventions    Food Insecurity Interventions -- Intervention Not Indicated  Housing Interventions -- Intervention Not Indicated  Transportation Interventions -- Intervention Not Indicated  Depression Interventions/Treatment  PHQ2-9 Score <4 Follow-up Not Indicated --  Financial Strain Interventions -- Intervention Not Indicated       Medication Assistance: None required.  Patient affirms current coverage meets needs.  Medication Access: Within the past 30 days, how often has patient missed a dose of medication? 0 Is a pillbox or other method used to improve adherence? Yes  Factors that may affect medication adherence? no barriers identified Are meds synced by current pharmacy? No  Are meds delivered by current pharmacy? No  Does patient experience delays in picking up medications due to transportation concerns? No   [facility] Reviewed: Is patient disadvantaged to use [facility]?: No  Current Rx  insurance plan: [facility] Name and location of Current pharmacy:  [facility] - [place], [place] - [address] [place] [address] Phone: [phone] Fax: [phone]  [facility] services reviewed with patient today?: Yes  Patient requests to transfer care to [facility]?: No  Reason patient declined to change pharmacies: Loyalty to other pharmacy/Patient preference  Compliance/Adherence/Medication fill history: Care  Gaps: None  Star-Rating Drugs: Rosuvastatin 10mg  01/08/22 90ds - changed to lower dose.   Assessment/Plan     Hypertension (BP goal <140/90) 06/21/22 -Controlled, based on home readings -Current treatment: Amlodipine 5mg  Appropriate, Effective, Safe, Accessible -Medications previously tried: metoprolol d/c -Current home readings:128/61 P70, 120/46 P 69, 148/69  -Current dietary habits: balanced diet -Current exercise habits: minimal  -Reports dizziness have improved -Educated on BP goals and benefits of medications for prevention of heart attack, stroke and kidney damage; Daily salt intake goal < 2300 mg; Exercise goal of 150 minutes per week; Importance of home blood pressure monitoring; -She has increased amlodipine to 5mg .  Denies any dizzy symptoms but is still having occasional tired spells.  Attributes this to dose increase.  I would give it another few weeks to see how you adjust.  If symptoms worsen, let me know!  Hyperlipidemia: (LDL goal < 70) 06/21/22 -Not ideally controlled, LDL was 96 in october -Current treatment: Rosuvastatin 5mg  once per week Appropriate, Query effective, -Medications previously tried: fenofibrate  -Educated on Cholesterol goals;  Benefits of statin for ASCVD risk reduction; Importance of limiting foods high in cholesterol; -Recommended to continue current medication Plans to start this tonight.  She had not started Crestor at the lower dose because she wanted to see if it was causing her tired symptoms.  She still had those so she did not attribute it to Crestor.  Plans to start the reduced dose tonight.  Will FU in 2-3 weeks to assess tolerance.  Osteoporosis (Goal Reduce fall/fracture risk) -Not ideally controlled -Last DEXA Scan: January 2023   T-Score femoral neck: -2.4  T-Score lumbar spine: -2.4  10-year probability of major osteoporotic fracture: 15.0%  10-year probability of hip fracture: 5.5% -Patient is a candidate for  pharmacologic treatment due to T-Score -1.0 to -2.5 and 10-year risk of hip fracture > 3% -Current treatment  Vitamin D 1000 IU daily Appropriate, Effective, Safe, Accessible -Medications previously tried: none noted  -Recommend [phone] units of vitamin D daily. Recommend 1200 mg of calcium daily from dietary and supplemental sources. Recommend weight-bearing and muscle strengthening exercises for building and maintaining bone density. Patient has declined treatment up to this point.  Discussed risk of falls with dizziness and risk of hip fracture warrants treatment.  At this point she prefers to monitor but said she would consider Prolia.  She is concerned about cost. -Recommended continue current management and routine screenings.  GERD/Hiatal Hernia (Goal: Minimize symptoms) -Controlled -Current treatment  Pantoprazole 40mg  daily Appropriate, Effective, Safe, Accessible -Medications previously tried: none noted -Appropriate due to Hiatal hernia.  Denies any current symptoms  -Recommended to continue current medication She is interested in reaching out to her MD to see about reducing dose because she read about negative impact on bone density.  Would be ok using lowest effective dose.  Meniere's Disease (Goal: Reduce symptoms) -Controlled -Current treatment  None -Medications previously tried: Prednisone -She is having some dizziness symptoms with ringing in ear currently.  -Recommended she follow up with audiology which she already has appt with.  Caution when driving or walking  due to risk of fracture.  Allergic Rhinitis (Goal: Reduce symptoms) -Controlled -Current treatment  Loratadine 10mg  daily Appropriate, Effective, Safe, Accessible Montelukast 10mg  daily Appropriate, Effective, Safe, Accessible -Medications previously tried: none noted  -Still has allergies, symptoms are well controlled -Recommended to continue current medication No changes needed at this time        Beverly Milch, PharmD Clinical Pharmacist  [facility] [phone]

## 2022-07-08 ENCOUNTER — Ambulatory Visit: Payer: Medicare Other | Admitting: Family Medicine

## 2022-07-22 DIAGNOSIS — H02055 Trichiasis without entropian left lower eyelid: Secondary | ICD-10-CM | POA: Diagnosis not present

## 2022-08-06 ENCOUNTER — Encounter: Payer: Self-pay | Admitting: Family Medicine

## 2022-08-11 ENCOUNTER — Ambulatory Visit (INDEPENDENT_AMBULATORY_CARE_PROVIDER_SITE_OTHER): Payer: Medicare Other | Admitting: Physician Assistant

## 2022-08-11 ENCOUNTER — Other Ambulatory Visit: Payer: Self-pay | Admitting: Hematology and Oncology

## 2022-08-11 VITALS — BP 130/68 | HR 88 | Temp 98.4°F | Ht 66.0 in | Wt 117.4 lb

## 2022-08-11 DIAGNOSIS — R300 Dysuria: Secondary | ICD-10-CM

## 2022-08-11 DIAGNOSIS — Z87440 Personal history of urinary (tract) infections: Secondary | ICD-10-CM | POA: Diagnosis not present

## 2022-08-11 LAB — POC URINALSYSI DIPSTICK (AUTOMATED)
Bilirubin, UA: NEGATIVE
Blood, UA: POSITIVE
Glucose, UA: NEGATIVE
Ketones, UA: POSITIVE — AB
Nitrite, UA: NEGATIVE
Protein, UA: NEGATIVE
Spec Grav, UA: 1.01 (ref 1.010–1.025)
Urobilinogen, UA: 0.2 E.U./dL
pH, UA: 6.5 (ref 5.0–8.0)

## 2022-08-11 MED ORDER — CEPHALEXIN 500 MG PO CAPS: 500.0000 mg | ORAL_CAPSULE | Freq: Three times a day (TID) | ORAL | 0 refills | Status: AC

## 2022-08-11 NOTE — Progress Notes (Signed)
Subjective:    Patient ID: Lori [name], female    DOB: 1940-12-28, 82 y.o.   MRN: [id number]  Chief Complaint  Patient presents with   Urinary Tract Infection    Pt in office w/ possible UTI; pt states this is coming for her and has hx of kidney stones; pt c/o pressure, and discoloration in urine; is established with [facility], blood in her urine is also common; pt has some burning not taking AZO at this point    Urinary Tract Infection    Patient is in today for concerns of UTI. Symptoms started about 4 days ago. Brown color to urine, more pressure in lower abdomen. Dysuria present. No fever or chills. No back pain. Blood in urine, but says this is common for her. Hx of renal stones. Follows with [facility].   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    Eczema    Family history of breast cancer    Family history of kidney cancer    Family history of multiple myeloma    Family history of thyroid cancer    GERD (gastroesophageal reflux disease)    Headache(784.0)    History of IBS    History of kidney stones    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    Hyperlipidemia    on medication   Hypertension    Low back pain    Meniere disease    Neuromuscular disorder (HCC)    raynaud's   Osteopenia    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HCC)    Stroke (HCC) 1988   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 carotid artery dissection    Tubular adenoma of colon 02/2013   Varicose veins with inflammation    upper and lower per pt    Vasculitis [facility])     Past Surgical  History:  Procedure Laterality Date   arthroscopic knee  2009   left knee/ torn meniscus   BREAST LUMPECTOMY WITH RADIOACTIVE SEED LOCALIZATION Right 11/05/2020   Procedure: RIGHT BREAST LUMPECTOMY WITH RADIOACTIVE SEED LOCALIZATION;  Surgeon: Harriette Bouillon, MD;  Location: [facility];  Service: General;  Laterality: Right;   BUNIONECTOMY Right 1998   with other foot surgery    carotid artery disection  1988   Carotid Artery Dissection   CATARACT EXTRACTION, BILATERAL  07-18-2017,08-08-2017   CESAREAN SECTION  1988   1 time   COLONOSCOPY  2019   last 2019   DILATION AND CURETTAGE OF UTERUS  2004   EYE SURGERY     EYE SURGERY  09/25/2021   lowere lid of left eye   POLYPECTOMY     POPLITEAL SYNOVIAL CYST EXCISION     left leg   TONSILLECTOMY  1957   UPPER GASTROINTESTINAL ENDOSCOPY     last 2018    Family History  Problem Relation Age of Onset   Multiple myeloma  Father 42   Thyroid cancer Sister 100       s/p removal. papilary and anaplastic.    Lung cancer Sister    Kidney disease Brother        cancer- removed   Kidney cancer Brother        dx early 37s   Alzheimer's disease Brother    Stroke Brother 67   Breast cancer Cousin 42       paternal first cousin   Cancer Cousin        unknown type, paternal first cousin   Breast cancer Other        mother's first cousin   Cervical cancer Other    Colon cancer Neg Hx    Esophageal cancer Neg Hx    Rectal cancer Neg Hx    Stomach cancer Neg Hx    Colon polyps Neg Hx     Social History   Tobacco Use   Smoking status: Never   Smokeless tobacco: Never  Substance Use Topics   Alcohol use: No    Alcohol/week: 0.0 standard drinks of alcohol   Drug use: No     Allergies  Allergen Reactions   Hydrocodone-Acetaminophen     VOMITING   Azithromycin Other (See Comments)    Thrush    Bacitracin-Polymyxin B Itching    Burning and runny eye    Klonopin [Clonazepam]     -august 27th- took one dose and  experienced zombie like sensation- states will never take agian   Neomycin    Nitrofurantoin     Reports tingling on tongue- no swelling   Penicillins     REACTION: Arm swelling   Pneumococcal Vaccine Polyvalent     ARM REDNESS WITH TENDERNESS   Pneumovax [Pneumococcal Polysaccharide Vaccine]     Review of Systems NEGATIVE UNLESS OTHERWISE INDICATED IN HPI      Objective:     BP 130/68 (BP Location: Left Arm)   Pulse 88   Temp 98.4 F (36.9 C) (Temporal)   Ht 5\' 6"  (1.676 m)   Wt 117 lb 6.4 oz (53.3 kg)   SpO2 97%   BMI 18.95 kg/m   Wt Readings from Last 3 Encounters:  08/11/22 117 lb 6.4 oz (53.3 kg)  06/11/22 117 lb 12.8 oz (53.4 kg)  05/28/22 117 lb 9.6 oz (53.3 kg)    BP Readings from Last 3 Encounters:  08/11/22 130/68  06/11/22 120/62  05/28/22 (!) 154/76     Physical Exam Vitals and nursing note reviewed.  Constitutional:      General: She is not in acute distress.    Appearance: Normal appearance. She is not ill-appearing.  HENT:     Head: Normocephalic and atraumatic.  Cardiovascular:     Rate and Rhythm: Normal rate and regular rhythm.     Pulses: Normal pulses.     Heart sounds: Normal heart sounds.  Pulmonary:     Effort: Pulmonary effort is normal.     Breath sounds: Normal breath sounds.  Abdominal:     General: Abdomen is flat. Bowel sounds are normal.     Palpations: Abdomen is soft.     Tenderness: There is no right CVA tenderness or left CVA tenderness.  Skin:    General: Skin is warm and dry.  Neurological:     General: No focal deficit present.     Mental Status: She is alert.  Psychiatric:        Mood and Affect: Mood normal.  Assessment & Plan:  Dysuria -     POCT Urinalysis Dipstick (Automated) -     Urine Culture  History of recurrent UTIs -     Urine Culture  Other orders -     Cephalexin; Take 1 capsule (500 mg total) by mouth 3 (three) times daily for 7 days.  Dispense: 21 capsule; Refill: 0   Sending  urine for culture. Cephalexin sent to pharmacy for her (pt nervous about this being closed over holiday weekend). May start on this and AZO. Will change tx plan pending culture results if necessary. Red flags discussed.    Return if symptoms worsen or fail to improve.   Hasani Diemer M Deneka Greenwalt, PA-C

## 2022-08-11 NOTE — Patient Instructions (Signed)
Glad to see you today! I will send your urine for culture and let you know results. You may start on AZO and cephalexin today because of your symptoms. Push fluids.  Let me know if worse / any changes this week.

## 2022-08-12 LAB — URINE CULTURE
MICRO NUMBER:: 14990299
Result:: NO GROWTH
SPECIMEN QUALITY:: ADEQUATE

## 2022-08-13 ENCOUNTER — Encounter: Payer: Self-pay | Admitting: Physician Assistant

## 2022-08-13 NOTE — Telephone Encounter (Signed)
Please see pt note and advise 

## 2022-08-15 NOTE — Progress Notes (Signed)
Patient Care Team: Shelva Majestic, MD as PCP - General (Family Medicine) Christia Reading, MD as Consulting Physician (Otolaryngology) Meryl Dare, MD as Consulting Physician (Gastroenterology) Aris Lot, MD as Consulting Physician (Dermatology) Harriette Bouillon, MD as Consulting Physician (General Surgery) Serena Croissant, MD as Consulting Physician (Hematology and Oncology) Dorothy Puffer, MD as Consulting Physician (Radiation Oncology) Erroll Luna, [facility] as Pharmacist (Pharmacist)  DIAGNOSIS:  Encounter Diagnosis  Name Primary?   Malignant neoplasm of lower-inner quadrant of right breast of female, estrogen receptor positive (HCC) Yes    SUMMARY OF ONCOLOGIC HISTORY: Oncology History  Malignant neoplasm of lower-inner quadrant of right breast of female, estrogen receptor positive (HCC)  10/13/2020 Initial Diagnosis   Screening mammogram showed indeterminate mass in the right breast. Diagnostic mammogram and US showed 1 cm x 1.3 cm suspicious irregular mass at 4:00 4 cm from the nipple. 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   10/22/2020 Cancer Staging   Staging form: Breast, AJCC 8th Edition - Clinical stage from 10/22/2020: Stage IA (cT1b, cN0, cM0, G2, ER+, PR+, HER2-) - Signed by Serena Croissant, MD on 10/22/2020 Stage prefix: Initial diagnosis Histologic grading system: 3 grade system   11/05/2020 Genetic Testing   Negative hereditary cancer genetic testing: no pathogenic variants detected in [facility] CancerNext-Expanded +RNAinsight Panel.  The report date is November 05, 2020.    The CancerNext-Expanded gene panel offered by [facility] and includes sequencing, rearrangement, and RNA analysis for the following 77 genes: AIP, ALK, APC, ATM, AXIN2, BAP1, BARD1, BLM, BMPR1A, BRCA1, BRCA2, BRIP1, CDC73, CDH1, CDK4, CDKN1B, CDKN2A, CHEK2, CTNNA1, DICER1, FANCC, FH, FLCN, GALNT12, KIF1B, LZTR1, MAX, MEN1, MET, MLH1, MSH2, MSH3, MSH6, MUTYH,  NBN, NF1, NF2, NTHL1, PALB2, PHOX2B, PMS2, POT1, PRKAR1A, PTCH1, PTEN, RAD51C, RAD51D, RB1, RECQL, RET, SDHA, SDHAF2, SDHB, SDHC, SDHD, SMAD4, SMARCA4, SMARCB1, SMARCE1, STK11, SUFU, TMEM127, TP53, TSC1, TSC2, VHL and XRCC2 (sequencing and deletion/duplication); EGFR, EGLN1, HOXB13, KIT, MITF, PDGFRA, POLD1, and POLE (sequencing only); EPCAM and GREM1 (deletion/duplication only).    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, margins negative, ER 95%, PR 80%, HER2 negative, Ki-67 10%   11/05/2020 Cancer Staging   Staging form: Breast, AJCC 8th Edition - Pathologic stage from 11/05/2020: Stage IA (pT1b, pN0, cM0, G2, ER+, PR+, HER2-) - Signed by Loa Socks, NP on 02/18/2021 Stage prefix: Initial diagnosis Histologic grading system: 3 grade system   11/2020 -  Anti-estrogen oral therapy   Letrozole daily     CHIEF COMPLIANT: Follow-up of right breast cancer surveillance/ letrozole    INTERVAL HISTORY: Lori [name] is a 82 y.o. with above-mentioned history of right breast cancer. She presents to the clinic today for a follow-up. She reports that she is tolerating the letrozole. She does get hot flashes. She says she does have some cold flashes, she puts a blanket on or drink something hot and it goes away. She has balance issues that goes on for days. She does has vertigo. She denies any pain or discomfort in breast. She is eating healthy and she does walk.   ALLERGIES:  is allergic to hydrocodone-acetaminophen, azithromycin, bacitracin-polymyxin b, klonopin [clonazepam], neomycin, nitrofurantoin, penicillins, pneumococcal vaccine polyvalent, and pneumovax [pneumococcal polysaccharide vaccine].  MEDICATIONS:  Current Outpatient Medications  Medication Sig Dispense Refill   cholecalciferol (VITAMIN D3) 25 MCG (1000 UNIT) tablet Take 1,000 Units by mouth daily. Take 1 tablet daily     amLODipine (NORVASC) 5 MG tablet Take 1  tablet (5 mg total) by mouth  daily. 30 tablet 5   aspirin 81 MG tablet Take 81 mg by mouth daily. Evening     cyanocobalamin 1000 MCG tablet Take 1,000 mcg by mouth once a week.     letrozole (FEMARA) 2.5 MG tablet TAKE 1 TABLET BY MOUTH EVERY DAY 90 tablet 3   loratadine (CLARITIN) 10 MG tablet Take 10 mg by mouth daily. Take 1/2 tablet daily     montelukast (SINGULAIR) 10 MG tablet TAKE 1 TABLET BY MOUTH NIGHTLY AT BEDTIME 90 tablet 1   ondansetron (ZOFRAN-ODT) 4 MG disintegrating tablet Take 1 tablet (4 mg total) by mouth every 8 (eight) hours as needed for nausea or vomiting. 10 tablet 0   pantoprazole (PROTONIX) 40 MG tablet TAKE 1 TABLET BY MOUTH EVERY DAY 90 tablet 1   phenazopyridine (PYRIDIUM) 100 MG tablet Take 1 tablet (100 mg total) by mouth 3 (three) times daily as needed for pain. Do not use longer than 2 days total. 10 tablet 0   No current facility-administered medications for this visit.    PHYSICAL EXAMINATION: ECOG PERFORMANCE STATUS: 1 - Symptomatic but completely ambulatory  Vitals:   08/23/22 0926  BP: (!) 146/64  Pulse: 79  Resp: 18  Temp: 97.8 F (36.6 C)  SpO2: 99%   Filed Weights   08/23/22 0926  Weight: 116 lb 14.4 oz (53 kg)    BREAST: No palpable masses or nodules in either right or left breasts. No palpable axillary supraclavicular or infraclavicular adenopathy no breast tenderness or nipple discharge. (exam performed in the presence of a chaperone)  LABORATORY DATA:  I have reviewed the data as listed    Latest Ref Rng & Units 08/23/2022    9:13 AM 05/12/2022   11:04 AM 03/05/2022    3:05 PM  CMP  Glucose 70 - 99 mg/dL 86  161    BUN 8 - 23 mg/dL 22  24    Creatinine 0.96 - 1.00 mg/dL 0.45  4.09    Sodium 811 - 145 mmol/L 139  141    Potassium 3.5 - 5.1 mmol/L 4.1  3.8  4.4   Chloride 98 - 111 mmol/L 104  104    CO2 22 - 32 mmol/L 29  24    Calcium 8.9 - 10.3 mg/dL 91.4  78.2    Total Protein 6.5 - 8.1 g/dL 7.9  7.4    Total Bilirubin 0.3 - 1.2 mg/dL 0.4  0.4     Alkaline Phos 38 - 126 U/L 90  67    AST 15 - 41 U/L 18  21    ALT 0 - 44 U/L 12  14      Lab Results  Component Value Date   WBC 7.2 08/23/2022   HGB 12.1 08/23/2022   HCT 38.3 08/23/2022   MCV 89.5 08/23/2022   PLT 297 08/23/2022   NEUTROABS 3.7 08/23/2022    ASSESSMENT & PLAN:  Malignant neoplasm of lower-inner quadrant of right breast of female, estrogen receptor positive (HCC) 10/13/2020:Screening mammogram showed indeterminate mass in the right breast. Diagnostic mammogram and US showed 1 cm x 1.3 cm suspicious irregular mass at 4:00 4 cm from the nipple. 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   11/05/2020:Right lumpectomy: Grade 2 IDC, 0.8 cm with DCIS, margins negative, ER 95%, PR 80%, HER2 negative, Ki-67 10% Based on good prognostic profile: Did not receive radiation   Osteopenia: T score -2.4: Previously received Prolia but  discontinued because of high co-pay's (not eligible for oral bisphosphonate therapy because of esophagitis).   Current treatment: Letrozole Letrozole toxicities: Hot flashes and cold chills Breast cancer surveillance: 1.  Breast exam 08/23/2022: Benign 2. mammogram 11/10/2021: Benign   Intermittent episodes of vertigo followed by dizziness that lasted up to 2 months: Unclear etiology Return to clinic in 1 year for follow-up    No orders of the defined types were placed in this encounter.  The patient has a good understanding of the overall plan. she agrees with it. she will call with any problems that may develop before the next visit here. Total time spent: 30 mins including face to face time and time spent for planning, charting and co-ordination of care   Tamsen Meek, MD 08/23/22    I Janan Ridge am acting as a Neurosurgeon for [employer]  I have reviewed the above documentation for accuracy and completeness, and I agree with the above.

## 2022-08-23 ENCOUNTER — Inpatient Hospital Stay: Payer: Medicare Other | Attending: Hematology and Oncology | Admitting: Hematology and Oncology

## 2022-08-23 ENCOUNTER — Encounter: Payer: Self-pay | Admitting: Gastroenterology

## 2022-08-23 ENCOUNTER — Inpatient Hospital Stay: Payer: Medicare Other

## 2022-08-23 ENCOUNTER — Ambulatory Visit: Payer: Medicare Other

## 2022-08-23 VITALS — BP 146/64 | HR 79 | Temp 97.8°F | Resp 18 | Ht 66.0 in | Wt 116.9 lb

## 2022-08-23 DIAGNOSIS — C50311 Malignant neoplasm of lower-inner quadrant of right female breast: Secondary | ICD-10-CM

## 2022-08-23 DIAGNOSIS — Z79899 Other long term (current) drug therapy: Secondary | ICD-10-CM | POA: Diagnosis not present

## 2022-08-23 DIAGNOSIS — R232 Flushing: Secondary | ICD-10-CM | POA: Diagnosis not present

## 2022-08-23 DIAGNOSIS — R42 Dizziness and giddiness: Secondary | ICD-10-CM | POA: Insufficient documentation

## 2022-08-23 DIAGNOSIS — Z7982 Long term (current) use of aspirin: Secondary | ICD-10-CM | POA: Insufficient documentation

## 2022-08-23 DIAGNOSIS — Z79811 Long term (current) use of aromatase inhibitors: Secondary | ICD-10-CM | POA: Insufficient documentation

## 2022-08-23 DIAGNOSIS — Z170 Estrogen receptor positive status [ER+]: Secondary | ICD-10-CM

## 2022-08-23 DIAGNOSIS — M8580 Other specified disorders of bone density and structure, unspecified site: Secondary | ICD-10-CM | POA: Diagnosis not present

## 2022-08-23 LAB — CBC WITH DIFFERENTIAL (CANCER CENTER ONLY)
Abs Immature Granulocytes: 0.01 10*3/uL (ref 0.00–0.07)
Basophils Absolute: 0 10*3/uL (ref 0.0–0.1)
Basophils Relative: 1 %
Eosinophils Absolute: 0.3 10*3/uL (ref 0.0–0.5)
Eosinophils Relative: 4 %
HCT: 38.3 % (ref 36.0–46.0)
Hemoglobin: 12.1 g/dL (ref 12.0–15.0)
Immature Granulocytes: 0 %
Lymphocytes Relative: 32 %
Lymphs Abs: 2.3 10*3/uL (ref 0.7–4.0)
MCH: 28.3 pg (ref 26.0–34.0)
MCHC: 31.6 g/dL (ref 30.0–36.0)
MCV: 89.5 fL (ref 80.0–100.0)
Monocytes Absolute: 0.9 10*3/uL (ref 0.1–1.0)
Monocytes Relative: 12 %
Neutro Abs: 3.7 10*3/uL (ref 1.7–7.7)
Neutrophils Relative %: 51 %
Platelet Count: 297 10*3/uL (ref 150–400)
RBC: 4.28 MIL/uL (ref 3.87–5.11)
RDW: 13.4 % (ref 11.5–15.5)
WBC Count: 7.2 10*3/uL (ref 4.0–10.5)
nRBC: 0 % (ref 0.0–0.2)

## 2022-08-23 LAB — CMP (CANCER CENTER ONLY)
ALT: 12 U/L (ref 0–44)
AST: 18 U/L (ref 15–41)
Albumin: 4.2 g/dL (ref 3.5–5.0)
Alkaline Phosphatase: 90 U/L (ref 38–126)
Anion gap: 6 (ref 5–15)
BUN: 22 mg/dL (ref 8–23)
CO2: 29 mmol/L (ref 22–32)
Calcium: 10 mg/dL (ref 8.9–10.3)
Chloride: 104 mmol/L (ref 98–111)
Creatinine: 0.64 mg/dL (ref 0.44–1.00)
GFR, Estimated: >60 mL/min (ref >60)
Glucose, Bld: 86 mg/dL (ref 70–99)
Potassium: 4.1 mmol/L (ref 3.5–5.1)
Sodium: 139 mmol/L (ref 135–145)
Total Bilirubin: 0.4 mg/dL (ref 0.3–1.2)
Total Protein: 7.9 g/dL (ref 6.5–8.1)

## 2022-08-23 NOTE — Assessment & Plan Note (Addendum)
10/13/2020:Screening mammogram showed indeterminate mass in the right breast. Diagnostic mammogram and US showed 1 cm x 1.3 cm suspicious irregular mass at 4:00 4 cm from the nipple. 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   11/05/2020:Right lumpectomy: Grade 2 IDC, 0.8 cm with DCIS, margins negative, ER 95%, PR 80%, HER2 negative, Ki-67 10% Based on good prognostic profile: Did not receive radiation   Osteopenia: T score -2.4: Previously received Prolia but discontinued because of high co-pay's (not eligible for oral bisphosphonate therapy because of esophagitis).   Current treatment: Letrozole Letrozole toxicities: Hot flashes and cold chills Breast cancer surveillance: 1.  Breast exam 08/23/2022: Benign 2. mammogram 11/10/2021: Benign   Return to clinic in 1 year for follow-up

## 2022-09-09 DIAGNOSIS — H02055 Trichiasis without entropian left lower eyelid: Secondary | ICD-10-CM | POA: Diagnosis not present

## 2022-09-16 ENCOUNTER — Encounter: Payer: Self-pay | Admitting: Family

## 2022-09-16 ENCOUNTER — Ambulatory Visit (INDEPENDENT_AMBULATORY_CARE_PROVIDER_SITE_OTHER): Payer: Medicare Other | Admitting: Family

## 2022-09-16 VITALS — BP 122/66 | HR 88 | Temp 97.8°F | Ht 66.0 in | Wt 116.8 lb

## 2022-09-16 DIAGNOSIS — R21 Rash and other nonspecific skin eruption: Secondary | ICD-10-CM | POA: Diagnosis not present

## 2022-09-16 MED ORDER — TRIAMCINOLONE ACETONIDE 0.1 % EX CREA
1.0000 | TOPICAL_CREAM | Freq: Two times a day (BID) | CUTANEOUS | 0 refills | Status: DC
Start: 2022-09-16 — End: 2022-10-14

## 2022-09-16 NOTE — Progress Notes (Signed)
Patient ID: Lori [name], female    DOB: [birth year], 82 y.o.   MRN: [id number]  Chief Complaint  Patient presents with   Rash    Rash on right arm x 1 month possible poison ivy not healing.     HPI:      Skin rash:  located on right forearm, started over a week ago after she helped her son throw out some weeds, unsure if exposed to poison ivy but she does not remember having an allergy to in the past. Reports redness, swelling, and itching and seems to have worsened over the last week.      Assessment & Plan:  1. Skin rash - steroid cream sent to pharmacy, advised pt on use & SE. Apply a thin layer and cover with her cetaphil cream. Ok to keep a loose bandage on top to protect. Call office back if no improvement or worsening sx.  - triamcinolone cream (KENALOG) 0.1 %; Apply 1 Application topically 2 (two) times daily. To the rash on your right arm.  Dispense: 30 g; Refill: 0   Subjective:    Outpatient Medications Prior to Visit  Medication Sig Dispense Refill   amLODipine (NORVASC) 5 MG tablet Take 1 tablet (5 mg total) by mouth daily. 30 tablet 5   aspirin 81 MG tablet Take 81 mg by mouth daily. Evening     cholecalciferol (VITAMIN D3) 25 MCG (1000 UNIT) tablet Take 1,000 Units by mouth daily. Take 1 tablet daily     cyanocobalamin 1000 MCG tablet Take 1,000 mcg by mouth once a week.     letrozole (FEMARA) 2.5 MG tablet TAKE 1 TABLET BY MOUTH EVERY DAY 90 tablet 3   loratadine (CLARITIN) 10 MG tablet Take 10 mg by mouth daily. Take 1/2 tablet daily     montelukast (SINGULAIR) 10 MG tablet TAKE 1 TABLET BY MOUTH NIGHTLY AT BEDTIME 90 tablet 1   pantoprazole (PROTONIX) 40 MG tablet TAKE 1 TABLET BY MOUTH EVERY DAY 90 tablet 1   phenazopyridine (PYRIDIUM) 100 MG tablet Take 1 tablet (100 mg total) by mouth 3 (three) times daily as needed for pain. Do not use longer than 2 days total. 10 tablet 0   ondansetron (ZOFRAN-ODT) 4 MG disintegrating tablet Take 1 tablet (4 mg total)  by mouth every 8 (eight) hours as needed for nausea or vomiting. (Patient not taking: Reported on 09/16/2022) 10 tablet 0   No facility-administered medications prior to visit.   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    Eczema    Family history of breast cancer    Family history of kidney cancer    Family history of multiple myeloma    Family history of thyroid cancer    GERD (gastroesophageal reflux disease)    Headache(784.0)    History of IBS    History of kidney stones  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)    Past Surgical History:  Procedure Laterality Date   arthroscopic knee  2009   left knee/ torn meniscus   BREAST LUMPECTOMY WITH RADIOACTIVE SEED LOCALIZATION Right 11/05/2020   Procedure: RIGHT BREAST LUMPECTOMY WITH RADIOACTIVE SEED LOCALIZATION;  Surgeon: Harriette Bouillon, MD;  Location: [facility];  Service: General;  Laterality: Right;   BUNIONECTOMY Right 1998   with other foot surgery    carotid artery disection  1988   Carotid Artery Dissection   CATARACT EXTRACTION, BILATERAL  07-18-2017,08-08-2017   CESAREAN SECTION  1988   1 time   COLONOSCOPY   2019   last 2019   DILATION AND CURETTAGE OF UTERUS  2004   EYE SURGERY     EYE SURGERY  09/25/2021   lowere lid of left eye   POLYPECTOMY     POPLITEAL SYNOVIAL CYST EXCISION     left leg   TONSILLECTOMY  1957   UPPER GASTROINTESTINAL ENDOSCOPY     last 2018   Allergies  Allergen Reactions   Hydrocodone-Acetaminophen     VOMITING   Azithromycin Other (See Comments)    Thrush    Bacitracin-Polymyxin B Itching    Burning and runny eye    Klonopin [Clonazepam]     -august 27th- took one dose and experienced zombie like sensation- states will never take agian   Neomycin    Nitrofurantoin     Reports tingling on tongue- no swelling   Penicillins     REACTION: Arm swelling   Pneumococcal Vaccine Polyvalent     ARM REDNESS WITH TENDERNESS   Pneumovax [Pneumococcal Polysaccharide Vaccine]       Objective:    Physical Exam Vitals and nursing note reviewed.  Constitutional:      Appearance: Normal appearance.  Cardiovascular:     Rate and Rhythm: Normal rate and regular rhythm.  Pulmonary:     Effort: Pulmonary effort is normal.     Breath sounds: Normal breath sounds.  Musculoskeletal:        General: Normal range of motion.  Skin:    General: Skin is warm and dry.     Findings: Rash (right forearm near wrist, small dark pinpoint noted in the middle of light erythema approx 3cm in diameter with mild swelling, no warmth noted) present.  Neurological:     Mental Status: She is alert.  Psychiatric:        Mood and Affect: Mood normal.        Behavior: Behavior normal.    BP 122/66 (BP Location: Left Arm, Patient Position: Sitting, Cuff Size: Normal)   Pulse 88   Temp 97.8 F (36.6 C) (Temporal)   Ht 5\' 6"  (1.676 m)   Wt 116 lb 12.8 oz (53 kg)   SpO2 98%   BMI 18.85 kg/m  Wt Readings from Last 3 Encounters:  09/16/22 116 lb 12.8 oz (53 kg)  08/23/22 116 lb 14.4 oz (53 kg)  08/11/22 117  lb 6.4 oz (53.3 kg)       Dulce Sellar, NP

## 2022-09-16 NOTE — Patient Instructions (Signed)
It was very nice to see you today!   I think your rash is most likely a combination of a spider bite & possibly exposure to poison ivy. I have sent a steroid cream to your pharmacy. Apply a thin layer to all red areas. Can cover this with your Cetaphil moisturizer.  Do this for the next 5-7 days and let us no if it is not improving.        PLEASE NOTE:  If you had any lab tests please let us know if you have not heard back within a few days. You may see your results on MyChart before we have a chance to review them but we will give you a call once they are reviewed by [place]. If we ordered any referrals today, please let us know if you have not heard from their office within the next week.

## 2022-09-17 ENCOUNTER — Encounter: Payer: Self-pay | Admitting: Family

## 2022-09-20 NOTE — Telephone Encounter (Signed)
Allergy to triamcinolone cream  0.1% added.

## 2022-10-08 ENCOUNTER — Encounter: Payer: Self-pay | Admitting: Pharmacist

## 2022-10-08 NOTE — Progress Notes (Signed)
Patient previously followed by [facility] pharmacist. Per clinical review, no pharmacist appointment needed at this time.

## 2022-10-11 ENCOUNTER — Ambulatory Visit (INDEPENDENT_AMBULATORY_CARE_PROVIDER_SITE_OTHER): Payer: Medicare Other | Admitting: Family Medicine

## 2022-10-11 ENCOUNTER — Encounter: Payer: Self-pay | Admitting: Family Medicine

## 2022-10-11 VITALS — BP 120/60 | HR 92 | Temp 97.8°F | Ht 66.0 in | Wt 117.2 lb

## 2022-10-11 DIAGNOSIS — R739 Hyperglycemia, unspecified: Secondary | ICD-10-CM

## 2022-10-11 DIAGNOSIS — R300 Dysuria: Secondary | ICD-10-CM | POA: Diagnosis not present

## 2022-10-11 DIAGNOSIS — I10 Essential (primary) hypertension: Secondary | ICD-10-CM

## 2022-10-11 DIAGNOSIS — E785 Hyperlipidemia, unspecified: Secondary | ICD-10-CM

## 2022-10-11 MED ORDER — CEPHALEXIN 500 MG PO CAPS: 500.0000 mg | ORAL_CAPSULE | Freq: Three times a day (TID) | ORAL | 0 refills | Status: DC

## 2022-10-11 NOTE — Progress Notes (Signed)
Phone [phone] In person visit   Subjective:   Lori [name] is a 82 y.o. year old very pleasant female patient who presents for/with See problem oriented charting Chief Complaint  Patient presents with   Medical Management of Chronic Issues    Wants to know if she can increase frequency of tylenol since she hurts a lot.   Hypertension   burning with urination    Pt c/o on Saturday having burning with urination and urine looking like white milk. She has taken azo   mammogram    Pt is scheduled for 08/26.    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    Priority: Medium    Aortic atherosclerosis (HCC) 02/11/2016    Priority: Medium    Hypertension 11/29/2013    Priority: Medium    Hyperlipidemia 11/29/2013    Priority: Medium    Barrett's esophagus 08/24/2013    Priority: Medium    Allergic rhinitis 03/09/2007    Priority: Medium    Osteoporosis 09/22/2006    Priority: Medium    IBS (irritable bowel syndrome) 06/26/2019    Priority: Low   Eczema 11/29/2013    Priority: Low   GERD (gastroesophageal reflux disease) 05/16/2013    Priority: Low   Vitamin D deficiency 01/29/2012    Priority: Low   Solitary pulmonary nodule 06/08/2011    Priority: Low   HIATAL HERNIA WITH REFLUX 02/06/2010    Priority: Low   DEGENERATIVE JOINT DISEASE, KNEE 03/14/2008    Priority: Low   VARICOSE VEINS LOWER EXTREMITIES W/INFLAMMATION 03/09/2007    Priority: Low   ACTINIC KERATOSIS, FOREHEAD, LEFT 03/09/2007    Priority: Low   Headache(784.0) 03/09/2007    Priority: Low    MENIERE'S DISEASE 09/22/2006    Priority: Low   RAYNAUD'S DISEASE 09/22/2006    Priority: Low   Abnormal ear sensation, right 02/27/2021    Priority: 1.   Family history of kidney cancer 10/22/2020    Priority: 1.   Family history of thyroid cancer 10/22/2020    Priority: 1.   Family history of multiple myeloma 10/22/2020    Priority: 1.   Family history of breast cancer 10/22/2020    Priority: 1.   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 30 tablet 5   aspirin 81 MG tablet Take 81 mg by mouth daily. Evening     cephALEXin (KEFLEX) 500 MG capsule Take 1 capsule (500 mg total) by mouth 3 (three) times daily for 7 days. 21 capsule 0   cholecalciferol (VITAMIN D3) 25 MCG (1000 UNIT) tablet Take 1,000 Units by mouth daily. Take 1 tablet daily  cyanocobalamin 1000 MCG tablet Take 1,000 mcg by mouth once a week.     letrozole (FEMARA) 2.5 MG tablet TAKE 1 TABLET BY MOUTH EVERY DAY 90 tablet 3   loratadine (CLARITIN) 10 MG tablet Take 10 mg by mouth daily. Take 1/2 tablet daily     montelukast (SINGULAIR) 10 MG tablet TAKE 1 TABLET BY MOUTH NIGHTLY AT BEDTIME 90 tablet 1   pantoprazole (PROTONIX) 40 MG tablet TAKE 1 TABLET BY MOUTH EVERY DAY 90 tablet 1   phenazopyridine (PYRIDIUM) 100 MG tablet Take 1 tablet (100 mg total) by mouth 3 (three) times daily as needed for pain. Do not use longer than 2 days total. 10 tablet 0   triamcinolone cream (KENALOG) 0.1 % Apply 1 Application topically 2 (two) times daily. To the rash on your right arm. 30 g 0    ondansetron (ZOFRAN-ODT) 4 MG disintegrating tablet Take 1 tablet (4 mg total) by mouth every 8 (eight) hours as needed for nausea or vomiting. (Patient not taking: Reported on 10/11/2022) 10 tablet 0   No current facility-administered medications for this visit.     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  Patient with right lumpectomy 11/05/2020 with negative margins thankfully.  As a result she does not require adjuvant radiation.  She will be on letrozole daily for 5 years.  She is following with Dr. Chesley Mires is doing well and she is scheduled for mammogram 11/15/22  # Hypertension S:compliant with amlodipine 2.5mg --> 5 mg  -Metoprolol 25 mg BID led to bradycardia.  Now only takes 12.5 mg BID prn if BP >150 and HR over 70 -doing yoga  BP Readings from Last 3 Encounters:  10/11/22 120/60  09/16/22 122/66  08/23/22 (!) 146/64  A/P:  stable- continue current medicines  -some imbalance issues from sitting to standing- may try pinch of salt. Considered reducing amlodipine again but hold off.  -she may try pinch of salt in water to see if helps with orthostatic symptoms  # GERD/barrett's esophagus not noted 10/2019 Dr. Russella Dar- q3 year endoscopy S:Compliant with protonix 40mg  daily- using [employer] coffee and manuka honey starting late 2019 has been very helpful A/P: in general feels like reflux controlled. More of a throbbing than burning so doesn't think its her GERD. She feels she gets pain due to hiatal hernia- radiates to back. Gets more frequent - she plans to schedule vsiit with gastroenterology to  discuss further per recent mychart discussions. She wants to avoid surgery at all costs  # Hyperlipidemia/aortic atherosclerosis/statin myalgia #history of stroke on imaging- remote- on asa 81 mg  S:medication: no prescription- see note 08/06/22 mychart (Rosuvastatin 10 mg once weekly) and previously Felt poorly on rosuvastatin 10 mg 3x a week -prior fenofbrate  LDL goal at least under 100 and triglyceride goal under 200.  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 Has 325 and 500 mg tylenol available- can take up to 650 mg every 6 hours as needed -with varicose veins may need compression stockings but doesn't tolerate well due to pain   #Concern for UTI S: Patients symptoms started on Saturday noted some lower abdominal pressure and when urinated noted dysuria and urine was almost white color (only once).urine color normalized but burning persisted.  She took AZO and reports helps some.  Complains of dysuria: yes; polyuria: yes; nocturia: yes; urgency: yes.  Symptoms are slightly better. Some back pain- but typical of regular back pain.  ROS- no fever, chills, nausea, vomiting, flank pain. No blood in urine.  A/P: UA  will be obtained. Likely UTI. Will get urine culture. Empiric treatment with: cephalexin (tolerated this when treated in may for potential urinary tract infection but later stopped when no urinary tract infection  confirmed) Patient to follow up if new or worsening symptoms or failure to improve.   # Hyperglycemia/insulin resistance/prediabetes S:  Medication: none  Lab Results  Component Value Date   HGBA1C 6.2 01/06/2022   HGBA1C 6.2 05/20/2021   HGBA1C 6.1 01/02/2021    A/P: hopefully stable or improved-  update a1c today. Continue without meds for now      Recommended follow up: Return in about 6 months (around 04/13/2023) for physical or sooner if needed.Schedule b4 you leave. Future Appointments  Date Time Provider Department Center  10/14/2022  2:15 PM [facility] ANNUAL WELLNESS VISIT 1 [facility]  08/23/2023  1:45 PM Serena Croissant, MD [facility] None    Lab/Order associations:   ICD-10-CM   1. Primary hypertension  I10 Lipid panel    2. Hyperlipidemia, unspecified hyperlipidemia type  E78.5 Lipid panel    3. Hyperglycemia  R73.9     4. Dysuria  R30.0 Urine Culture    Urinalysis, Routine w reflex microscopic      Meds ordered this encounter  Medications   cephALEXin (KEFLEX) 500 MG capsule    Sig: Take 1 capsule (500 mg total) by mouth 3 (three) times daily for 7 days.    Dispense:  21 capsule    Refill:  0    Return precautions advised.  Tana Conch, MD

## 2022-10-11 NOTE — Patient Instructions (Signed)
UA  will be obtained. Likely UTI. Will get urine culture. Empiric treatment with: cephalexin (tolerated this when treated in may for potential urinary tract infection but later stopped when no urinary tract infection confirmed) Patient to follow up if new or worsening symptoms or failure to improve.    Has 325 and 500 mg tylenol available- can take up to 650 mg every 6 hours as needed  Schedule a lab visit at the check out desk within 2 weeks. Return for future fasting labs meaning nothing but water after midnight please. Ok to take your medications with water.   Recommended follow up: Return in about 6 months (around 04/13/2023) for physical or sooner if needed.Schedule b4 you leave.

## 2022-10-12 LAB — URINALYSIS, ROUTINE W REFLEX MICROSCOPIC
Bilirubin Urine: NEGATIVE
Ketones, ur: NEGATIVE
Nitrite: POSITIVE — AB
Specific Gravity, Urine: 1.01 (ref 1.000–1.030)
Total Protein, Urine: NEGATIVE
Urine Glucose: NEGATIVE
Urobilinogen, UA: 0.2 (ref 0.0–1.0)
pH: 8.5 — AB (ref 5.0–8.0)

## 2022-10-13 LAB — URINE CULTURE
MICRO NUMBER:: 15229758
SPECIMEN QUALITY:: ADEQUATE

## 2022-10-14 ENCOUNTER — Other Ambulatory Visit: Payer: Self-pay | Admitting: Family Medicine

## 2022-10-14 ENCOUNTER — Encounter: Payer: Self-pay | Admitting: Family Medicine

## 2022-10-14 ENCOUNTER — Ambulatory Visit (INDEPENDENT_AMBULATORY_CARE_PROVIDER_SITE_OTHER): Payer: Medicare Other

## 2022-10-14 VITALS — Wt 117.0 lb

## 2022-10-14 DIAGNOSIS — Z0000 Encounter for general adult medical examination without abnormal findings: Secondary | ICD-10-CM | POA: Diagnosis not present

## 2022-10-14 MED ORDER — SULFAMETHOXAZOLE-TRIMETHOPRIM 800-160 MG PO TABS: 1.0000 | ORAL_TABLET | Freq: Two times a day (BID) | ORAL | 0 refills | Status: DC

## 2022-10-14 NOTE — Patient Instructions (Signed)
Lori [name] , Thank you for taking time to come for your Medicare Wellness Visit. I appreciate your ongoing commitment to your health goals. Please review the following plan we discussed and let me know if I can assist you in the future.   Referrals/Orders/Follow-Ups/Clinician Recommendations: declined pneumonia related to negative reaction, pt goal is to stay healthy and active   This is a list of the screening recommended for you and due dates:  Health Maintenance  Topic Date Due   COVID-19 Vaccine (5 - 2023-24 season) 10/27/2022*   Pneumonia Vaccine (2 of 2 - PCV) 05/20/2048*   Flu Shot  10/21/2022   Mammogram  11/11/2022   DTaP/Tdap/Td vaccine (3 - Tdap) 04/05/2023   Medicare Annual Wellness Visit  10/14/2023   DEXA scan (bone density measurement)  Completed   Zoster (Shingles) Vaccine  Completed   HPV Vaccine  Aged Out   Hepatitis C Screening  Discontinued  *Topic was postponed. The date shown is not the original due date.    Advanced directives: (ACP Link)Information on Advanced Care Planning can be found at [facility] Advance Health Care Directives Advance Health Care Directives ([email or web address]   Next Medicare Annual Wellness Visit scheduled for next year: Yes  Preventive Care 65 Years and Older, Female Preventive care refers to lifestyle choices and visits with your health care provider that can promote health and wellness. What does preventive care include? A yearly physical exam. This is also called an annual well check. Dental exams once or twice a year. Routine eye exams. Ask your health care provider how often you should have your eyes checked. Personal lifestyle choices, including: Daily care of your teeth and gums. Regular physical activity. Eating a healthy diet. Avoiding tobacco and drug use. Limiting alcohol use. Practicing safe sex. Taking low-dose aspirin every day. Taking vitamin and mineral supplements as recommended by your health care  provider. What happens during an annual well check? The services and screenings done by your health care provider during your annual well check will depend on your age, overall health, lifestyle risk factors, and family history of disease. Counseling  Your health care provider may ask you questions about your: Alcohol use. Tobacco use. Drug use. Emotional well-being. Home and relationship well-being. Sexual activity. Eating habits. History of falls. Memory and ability to understand (cognition). Work and work Astronomer. Reproductive health. Screening  You may have the following tests or measurements: Height, weight, and BMI. Blood pressure. Lipid and cholesterol levels. These may be checked every 5 years, or more frequently if you are over 81 years old. Skin check. Lung cancer screening. You may have this screening every year starting at age 66 if you have a 30-pack-year history of smoking and currently smoke or have quit within the past 15 years. Fecal occult blood test (FOBT) of the stool. You may have this test every year starting at age 42. Flexible sigmoidoscopy or colonoscopy. You may have a sigmoidoscopy every 5 years or a colonoscopy every 10 years starting at age 24. Hepatitis C blood test. Hepatitis B blood test. Sexually transmitted disease (STD) testing. Diabetes screening. This is done by checking your blood sugar (glucose) after you have not eaten for a while (fasting). You may have this done every 1-3 years. Bone density scan. This is done to screen for osteoporosis. You may have this done starting at age 32. Mammogram. This may be done every 1-2 years. Talk to your health care provider about how often you should have  regular mammograms. Talk with your health care provider about your test results, treatment options, and if necessary, the need for more tests. Vaccines  Your health care provider may recommend certain vaccines, such as: Influenza vaccine. This is  recommended every year. Tetanus, diphtheria, and acellular pertussis (Tdap, Td) vaccine. You may need a Td booster every 10 years. Zoster vaccine. You may need this after age 78. Pneumococcal 13-valent conjugate (PCV13) vaccine. One dose is recommended after age 19. Pneumococcal polysaccharide (PPSV23) vaccine. One dose is recommended after age 61. Talk to your health care provider about which screenings and vaccines you need and how often you need them. This information is not intended to replace advice given to you by your health care provider. Make sure you discuss any questions you have with your health care provider. Document Released: 04/04/2015 Document Revised: 11/26/2015 Document Reviewed: 01/07/2015 Elsevier Interactive Patient Education  2017 [employer].  Fall Prevention in the Home Falls can cause injuries. They can happen to people of all ages. There are many things you can do to make your home safe and to help prevent falls. What can I do on the outside of my home? Regularly fix the edges of walkways and driveways and fix any cracks. Remove anything that might make you trip as you walk through a door, such as a raised step or threshold. Trim any bushes or trees on the path to your home. Use bright outdoor lighting. Clear any walking paths of anything that might make someone trip, such as rocks or tools. Regularly check to see if handrails are loose or broken. Make sure that both sides of any steps have handrails. Any raised decks and porches should have guardrails on the edges. Have any leaves, snow, or ice cleared regularly. Use sand or salt on walking paths during winter. Clean up any spills in your garage right away. This includes oil or grease spills. What can I do in the bathroom? Use night lights. Install grab bars by the toilet and in the tub and shower. Do not use towel bars as grab bars. Use non-skid mats or decals in the tub or shower. If you need to sit down in  the shower, use a plastic, non-slip stool. Keep the floor dry. Clean up any water that spills on the floor as soon as it happens. Remove soap buildup in the tub or shower regularly. Attach bath mats securely with double-sided non-slip rug tape. Do not have throw rugs and other things on the floor that can make you trip. What can I do in the bedroom? Use night lights. Make sure that you have a light by your bed that is easy to reach. Do not use any sheets or blankets that are too big for your bed. They should not hang down onto the floor. Have a firm chair that has side arms. You can use this for support while you get dressed. Do not have throw rugs and other things on the floor that can make you trip. What can I do in the kitchen? Clean up any spills right away. Avoid walking on wet floors. Keep items that you use a lot in easy-to-reach places. If you need to reach something above you, use a strong step stool that has a grab bar. Keep electrical cords out of the way. Do not use floor polish or wax that makes floors slippery. If you must use wax, use non-skid floor wax. Do not have throw rugs and other things on the floor that  can make you trip. What can I do with my stairs? Do not leave any items on the stairs. Make sure that there are handrails on both sides of the stairs and use them. Fix handrails that are broken or loose. Make sure that handrails are as long as the stairways. Check any carpeting to make sure that it is firmly attached to the stairs. Fix any carpet that is loose or worn. Avoid having throw rugs at the top or bottom of the stairs. If you do have throw rugs, attach them to the floor with carpet tape. Make sure that you have a light switch at the top of the stairs and the bottom of the stairs. If you do not have them, ask someone to add them for you. What else can I do to help prevent falls? Wear shoes that: Do not have high heels. Have rubber bottoms. Are comfortable  and fit you well. Are closed at the toe. Do not wear sandals. If you use a stepladder: Make sure that it is fully opened. Do not climb a closed stepladder. Make sure that both sides of the stepladder are locked into place. Ask someone to hold it for you, if possible. Clearly mark and make sure that you can see: Any grab bars or handrails. First and last steps. Where the edge of each step is. Use tools that help you move around (mobility aids) if they are needed. These include: Canes. Walkers. Scooters. Crutches. Turn on the lights when you go into a dark area. Replace any light bulbs as soon as they burn out. Set up your furniture so you have a clear path. Avoid moving your furniture around. If any of your floors are uneven, fix them. If there are any pets around you, be aware of where they are. Review your medicines with your doctor. Some medicines can make you feel dizzy. This can increase your chance of falling. Ask your doctor what other things that you can do to help prevent falls. This information is not intended to replace advice given to you by your health care provider. Make sure you discuss any questions you have with your health care provider. Document Released: 01/02/2009 Document Revised: 08/14/2015 Document Reviewed: 04/12/2014 Elsevier Interactive Patient Education  2017 [employer].

## 2022-10-14 NOTE — Progress Notes (Signed)
Subjective:   Lori [name] is a 82 y.o. female who presents for Medicare Annual (Subsequent) preventive examination.  Vital Signs: Unable to obtain new vitals due to this being a telehealth visit.   Visit Complete: Virtual  I connected with  Lori [name] on 10/14/22 by a audio enabled telemedicine application and verified that I am speaking with the correct person using two identifiers.  Patient Location: Home  Provider Location: Office/Clinic  I discussed the limitations of evaluation and management by telemedicine. The patient expressed understanding and agreed to proceed.   Review of Systems     Cardiac Risk Factors include: advanced age (>65men, >48 women);dyslipidemia;hypertension     Objective:    Today's Vitals   10/14/22 1344  Weight: 117 lb (53.1 kg)   Body mass index is 18.88 kg/m.     10/14/2022    1:56 PM 01/11/2022    1:25 PM 11/17/2021    8:11 AM 10/08/2021    2:41 PM 07/10/2021    9:36 AM 04/08/2021    9:47 AM 11/05/2020    7:05 AM  Advanced Directives  Does Patient Have a Medical Advance Directive? Yes Yes Yes Yes Yes No Yes  Type of Estate agent of [place];Living will Living will;Healthcare Power of [employer] Power of [place];Living will Healthcare Power of [employer] of [place];Living will  Healthcare Power of [place];Living will  Does patient want to make changes to medical advance directive? No - Patient declined  No - Patient declined  No - Patient declined  No - Patient declined  Copy of Healthcare Power of Attorney in Chart? Yes - validated most recent copy scanned in chart (See row information)  No - copy requested Yes - validated most recent copy scanned in chart (See row information) No - copy requested  No - copy requested  Would patient like information on creating a medical advance directive?      No - Patient declined     Current Medications (verified) Outpatient Encounter  Medications as of 10/14/2022  Medication Sig   amLODipine (NORVASC) 5 MG tablet Take 1 tablet (5 mg total) by mouth daily.   aspirin 81 MG tablet Take 81 mg by mouth daily. Evening   cephALEXin (KEFLEX) 500 MG capsule Take 1 capsule (500 mg total) by mouth 3 (three) times daily for 7 days.   cholecalciferol (VITAMIN D3) 25 MCG (1000 UNIT) tablet Take 1,000 Units by mouth daily. Take 1 tablet daily   cyanocobalamin 1000 MCG tablet Take 1,000 mcg by mouth once a week.   letrozole (FEMARA) 2.5 MG tablet TAKE 1 TABLET BY MOUTH EVERY DAY   loratadine (CLARITIN) 10 MG tablet Take 10 mg by mouth daily. Take 1/2 tablet daily   montelukast (SINGULAIR) 10 MG tablet TAKE 1 TABLET BY MOUTH NIGHTLY AT BEDTIME   pantoprazole (PROTONIX) 40 MG tablet TAKE 1 TABLET BY MOUTH EVERY DAY   ondansetron (ZOFRAN-ODT) 4 MG disintegrating tablet Take 1 tablet (4 mg total) by mouth every 8 (eight) hours as needed for nausea or vomiting. (Patient not taking: Reported on 10/14/2022)   [DISCONTINUED] phenazopyridine (PYRIDIUM) 100 MG tablet Take 1 tablet (100 mg total) by mouth 3 (three) times daily as needed for pain. Do not use longer than 2 days total.   [DISCONTINUED] triamcinolone cream (KENALOG) 0.1 % Apply 1 Application topically 2 (two) times daily. To the rash on your right arm.   No facility-administered encounter medications on file as of 10/14/2022.  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   Anxiety    Aortic atherosclerosis (HCC)    Arthritis    knee- DJD    Barrett's esophagus    Breast cancer (HCC)    right breast IDC   Burning mouth syndrome     Dr Jenne Pane 11-2016 - no smell or taste x 4 yrs per pt    Cataract    bilateral    Clotting disorder (HCC) 1988   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    Eczema    Family history of breast cancer    Family history of kidney cancer    Family history of multiple myeloma    Family history of thyroid cancer    GERD (gastroesophageal reflux disease)    Headache(784.0)    History of IBS    History of kidney stones    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    Hyperlipidemia    on medication   Hypertension    Low back pain    Meniere disease    Neuromuscular disorder (HCC)    raynaud's   Osteopenia    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HCC)    Stroke (HCC) 1988   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 carotid artery dissection    Tubular adenoma of colon 02/2013   Varicose veins with inflammation    upper and lower per pt    Vasculitis [facility])    Past Surgical History:  Procedure Laterality Date   arthroscopic knee  2009   left knee/ torn meniscus   BREAST LUMPECTOMY WITH RADIOACTIVE SEED LOCALIZATION Right 11/05/2020   Procedure: RIGHT BREAST LUMPECTOMY WITH RADIOACTIVE SEED LOCALIZATION;  Surgeon: Harriette Bouillon, MD;  Location: [facility];  Service: General;  Laterality: Right;   BUNIONECTOMY Right 1998   with other foot surgery    carotid artery disection  1988   Carotid Artery Dissection   CATARACT EXTRACTION, BILATERAL  07-18-2017,08-08-2017   CESAREAN SECTION  1988   1 time   COLONOSCOPY  2019   last 2019   DILATION AND CURETTAGE OF UTERUS  2004   EYE SURGERY     EYE SURGERY  09/25/2021   lowere lid of left eye   POLYPECTOMY     POPLITEAL SYNOVIAL CYST EXCISION     left leg   TONSILLECTOMY  1957   UPPER GASTROINTESTINAL ENDOSCOPY     last 2018   Family History  Problem Relation Age of Onset   Multiple myeloma Father 21   Thyroid cancer Sister 38       s/p removal. papilary and anaplastic.    Lung cancer Sister    Kidney disease Brother        cancer-  removed   Kidney cancer Brother        dx early 13s   Alzheimer's disease Brother    Stroke Brother 38   Breast cancer Cousin 54       paternal first cousin   Cancer Cousin        unknown type, paternal first cousin   Breast cancer Other        mother's first cousin   Cervical cancer Other    Colon cancer Neg  Hx    Esophageal cancer Neg Hx    Rectal cancer Neg Hx    Stomach cancer Neg Hx    Colon polyps Neg Hx    Social History   Socioeconomic History   Marital status: Married    Spouse name: Not on file   Number of children: Not on file   Years of education: Not on file   Highest education level: Not on file  Occupational History   Occupation: retired  Tobacco Use   Smoking status: Never   Smokeless tobacco: Never  Substance and Sexual Activity   Alcohol use: No    Alcohol/week: 0.0 standard drinks of alcohol   Drug use: No   Sexual activity: Yes    Birth control/protection: Post-menopausal  Other Topics Concern   Not on file  Social History Narrative   Lives with husband who is also a patient of Dr. Durene Cal. Gery Pray [address] 51, Berneda Rose (sees Dr. Durene Cal), Maralyn Sago 34. 3 grandkids in 2022   Social Determinants of Health   Financial Resource Strain: Low Risk  (10/14/2022)   Overall Financial Resource Strain (CARDIA)    Difficulty of Paying Living Expenses: Not hard at all  Food Insecurity: No Food Insecurity (10/14/2022)   Hunger Vital Sign    Worried About Running Out of Food in the Last Year: Never true    Ran Out of Food in the Last Year: Never true  Transportation Needs: No Transportation Needs (10/14/2022)   PRAPARE - Administrator, Civil Service (Medical): No    Lack of Transportation (Non-Medical): No  Physical Activity: Inactive (10/14/2022)   Exercise Vital Sign    Days of Exercise per Week: 0 days    Minutes of Exercise per Session: 0 min  Stress: No Stress Concern Present (10/14/2022)   [employer] of Occupational Health - Occupational  Stress Questionnaire    Feeling of Stress : Not at all  Social Connections: Socially Integrated (10/14/2022)   Social Connection and Isolation Panel [NHANES]    Frequency of Communication with Friends and Family: More than three times a week    Frequency of Social Gatherings with Friends and Family: More than three times a week    Attends Religious Services: 1 to 4 times per year    Active Member of [employer] or Organizations: Yes    Attends Banker Meetings: 1 to 4 times per year    Marital Status: Married    Tobacco Counseling Counseling given: Not Answered   Clinical Intake:  Pre-visit preparation completed: Yes  Pain : No/denies pain     BMI - recorded: 18.88 Nutritional Status: BMI <19  Underweight Nutritional Risks: None Diabetes: No  How often do you need to have someone help you when you read instructions, pamphlets, or other written materials from your doctor or pharmacy?: 1 - Never  Interpreter Needed?: No  Information entered by :: Lanier Ensign, LPN   Activities of Daily Living    10/14/2022    1:46 PM  In your present state of health, do you have any difficulty performing the following activities:  Hearing? 0  Vision? 0  Difficulty concentrating or making decisions? 0  Walking or climbing stairs? 0  Dressing or bathing? 0  Doing errands, shopping? 0  Preparing Food and eating ? N  Using the Toilet? N  In the past six months, have you accidently leaked urine? Y  Comment dribble and wears a pad during UTI  Do you have  problems with loss of bowel control? N  Managing your Medications? N  Managing your Finances? N  Housekeeping or managing your Housekeeping? N    Patient Care Team: Shelva Majestic, MD as PCP - General (Family Medicine) Christia Reading, MD as Consulting Physician (Otolaryngology) Meryl Dare, MD as Consulting Physician (Gastroenterology) Aris Lot, MD as Consulting Physician (Dermatology) Harriette Bouillon, MD  as Consulting Physician (General Surgery) Serena Croissant, MD as Consulting Physician (Hematology and Oncology) Dorothy Puffer, MD as Consulting Physician (Radiation Oncology) Erroll Luna, [facility] (Inactive) as Pharmacist (Pharmacist)  Indicate any recent Medical Services you may have received from other than [facility] providers in the past year (date may be approximate).     Assessment:   This is a routine wellness examination for Nikia.  Hearing/Vision screen Hearing Screening - Comments:: Pt denies any hearing issues  Vision Screening - Comments:: Pt follows up with Dr Rubye Oaks for Annual eye exam   Dietary issues and exercise activities discussed:     Goals Addressed               This Visit's Progress     Patient Stated (pt-stated)        Stay healthy        Depression Screen    10/14/2022    1:54 PM 09/16/2022    1:12 PM 04/23/2022    9:12 AM 10/22/2021   11:39 AM 10/08/2021    2:39 PM 01/02/2021   10:30 AM 09/25/2020    2:42 PM  PHQ 2/9 Scores  PHQ - 2 Score 0 0 0 0 0 0 0  PHQ- 9 Score   3 1       Fall Risk    10/14/2022    1:58 PM 09/16/2022    1:13 PM 04/23/2022    9:12 AM 10/22/2021   11:40 AM 10/08/2021    2:42 PM  Fall Risk   Falls in the past year? 0 0 0 1 0  Number falls in past yr: 0 0 0 0 0  Injury with Fall? 0 0 0 0 0  Risk for fall due to : Impaired balance/gait;Impaired vision No Fall Risks No Fall Risks Other (Comment) Impaired vision;Impaired balance/gait  Follow up Falls prevention discussed Falls evaluation completed Falls evaluation completed Falls evaluation completed Falls prevention discussed    MEDICARE RISK AT HOME:  Medicare Risk at Home - 10/14/22 1358     Any stairs in or around the home? No    If so, are there any without handrails? No    Home free of loose throw rugs in walkways, pet beds, electrical cords, etc? Yes    Adequate lighting in your home to reduce risk of falls? Yes    Life alert? No    Use of a cane, walker or w/c? Yes     Grab bars in the bathroom? Yes    Shower chair or bench in shower? No    Elevated toilet seat or a handicapped toilet? No             TIMED UP AND GO:  Was the test performed?  No    Cognitive Function:        10/14/2022    1:59 PM 10/08/2021    2:43 PM 09/25/2020    2:47 PM 12/01/2018    1:47 PM  6CIT Screen  What Year? 0 points 0 points 0 points 0 points  What month? 0 points 0 points 0 points 0 points  What time? 0 points 0 points 0 points 0 points  Count back from 20 0 points 0 points 0 points 0 points  Months in reverse 0 points 0 points 0 points 0 points  Repeat phrase 0 points 0 points 2 points 0 points  Total Score 0 points 0 points 2 points 0 points    Immunizations Immunization History  Administered Date(s) Administered   Fluad Quad(high Dose 65+) 12/01/2018, 12/27/2019, 12/01/2020, 01/06/2022   Influenza Split 01/04/2012   Influenza Whole 01/03/2007, 12/08/2007, 12/17/2008, 01/20/2010   Influenza, High Dose Seasonal PF 01/10/2015, 12/12/2015, 12/29/2016, 12/23/2017   Influenza,inj,Quad PF,6+ Mos 01/05/2013, 12/24/2013   PFIZER(Purple Top)SARS-COV-2 Vaccination 04/30/2019, 05/23/2019, 02/05/2020   Pfizer Covid-19 Vaccine Bivalent Booster 20yrs & up 02/03/2021   Pneumococcal Polysaccharide-23 02/12/2004   Td 03/23/2003, 04/04/2013   Zoster Recombinant(Shingrix) 11/13/2021, 01/14/2022    TDAP status: Up to date  Flu Vaccine status: Up to date  Pneumonia declined due to reaction   Covid-19 vaccine status: Declined, Education has been provided regarding the importance of this vaccine but patient still declined. Advised may receive this vaccine at local pharmacy or Health Dept.or vaccine clinic. Aware to provide a copy of the vaccination record if obtained from local pharmacy or Health Dept. Verbalized acceptance and understanding.  Qualifies for Shingles Vaccine? Yes   Zostavax completed Yes   Shingrix Completed?: Yes  Screening Tests Health Maintenance   Topic Date Due   COVID-19 Vaccine (5 - 2023-24 season) 10/27/2022 (Originally 11/20/2021)   INFLUENZA VACCINE  10/21/2022   MAMMOGRAM  11/11/2022   DTaP/Tdap/Td (3 - Tdap) 04/05/2023   Medicare Annual Wellness (AWV)  10/14/2023   DEXA SCAN  Completed   Zoster Vaccines- Shingrix  Completed   HPV VACCINES  Aged Out   Pneumonia Vaccine 42+ Years old  Discontinued   Hepatitis C Screening  Discontinued    Health Maintenance  There are no preventive care reminders to display for this patient.   Colorectal cancer screening: No longer required.   Mammogram status: Completed 11/10/21. Repeat every year  Bone Density status: Completed 03/30/21. Results reflect: Bone density results: OSTEOPOROSIS. Repeat every 2 years.   Additional Screening:    Vision Screening: Recommended annual ophthalmology exams for early detection of glaucoma and other disorders of the eye. Is the patient up to date with their annual eye exam?  Yes  Who is the provider or what is the name of the office in which the patient attends annual eye exams? Dr Rubye Oaks  If pt is not established with a provider, would they like to be referred to a provider to establish care? No .   Dental Screening: Recommended annual dental exams for proper oral hygiene    Community Resource Referral / Chronic Care Management: CRR required this visit?  No   CCM required this visit?  No     Plan:     I have personally reviewed and noted the following in the patient's chart:   Medical and social history Use of alcohol, tobacco or illicit drugs  Current medications and supplements including opioid prescriptions. Patient is not currently taking opioid prescriptions. Functional ability and status Nutritional status Physical activity Advanced directives List of other physicians Hospitalizations, surgeries, and ER visits in previous 12 months Vitals Screenings to include cognitive, depression, and falls Referrals and  appointments  In addition, I have reviewed and discussed with patient certain preventive protocols, quality metrics, and best practice recommendations. A written personalized care plan for preventive services as well as  general preventive health recommendations were provided to patient.     Marzella Schlein, LPN   05/05/863   After Visit Summary: (MyChart) Due to this being a telephonic visit, the after visit summary with patients personalized plan was offered to patient via MyChart   Nurse Notes: none

## 2022-10-19 ENCOUNTER — Telehealth: Payer: Self-pay | Admitting: Family Medicine

## 2022-10-19 NOTE — Telephone Encounter (Signed)
Pt would like a call back, she has some questions about her eating some oatmeal and if that affect the meds she is taking. Please advise.

## 2022-10-20 ENCOUNTER — Other Ambulatory Visit: Payer: Self-pay | Admitting: Family Medicine

## 2022-10-21 NOTE — Telephone Encounter (Signed)
Called and lm on pt vm tcb. 

## 2022-10-25 ENCOUNTER — Other Ambulatory Visit (INDEPENDENT_AMBULATORY_CARE_PROVIDER_SITE_OTHER): Payer: Medicare Other

## 2022-10-25 ENCOUNTER — Other Ambulatory Visit: Payer: Medicare Other

## 2022-10-25 ENCOUNTER — Encounter: Payer: Medicare Other | Admitting: Pharmacist

## 2022-10-25 ENCOUNTER — Other Ambulatory Visit: Payer: Self-pay

## 2022-10-25 ENCOUNTER — Encounter: Payer: Self-pay | Admitting: Family Medicine

## 2022-10-25 DIAGNOSIS — N3000 Acute cystitis without hematuria: Secondary | ICD-10-CM | POA: Diagnosis not present

## 2022-10-25 DIAGNOSIS — I10 Essential (primary) hypertension: Secondary | ICD-10-CM | POA: Diagnosis not present

## 2022-10-25 DIAGNOSIS — E785 Hyperlipidemia, unspecified: Secondary | ICD-10-CM

## 2022-10-25 LAB — LIPID PANEL
Cholesterol: 198 mg/dL (ref 0–200)
HDL: 86.7 mg/dL (ref >39.00)
LDL Cholesterol: 101 mg/dL — ABNORMAL HIGH (ref 0–99)
NonHDL: 111.29
Total CHOL/HDL Ratio: 2
Triglycerides: 52 mg/dL (ref 0.0–149.0)
VLDL: 10.4 mg/dL (ref 0.0–40.0)

## 2022-10-26 ENCOUNTER — Encounter: Payer: Self-pay | Admitting: Family Medicine

## 2022-10-27 ENCOUNTER — Other Ambulatory Visit: Payer: Self-pay

## 2022-10-27 MED ORDER — ATORVASTATIN CALCIUM 20 MG PO TABS: 20.0000 mg | ORAL_TABLET | ORAL | 3 refills | Status: DC

## 2022-11-04 ENCOUNTER — Telehealth: Payer: Self-pay | Admitting: Gastroenterology

## 2022-11-04 ENCOUNTER — Emergency Department (HOSPITAL_BASED_OUTPATIENT_CLINIC_OR_DEPARTMENT_OTHER)
Admission: EM | Admit: 2022-11-04 | Discharge: 2022-11-04 | Disposition: A | Discharge status: Home / Self Care | Payer: Medicare Other | Attending: Emergency Medicine | Admitting: Emergency Medicine

## 2022-11-04 ENCOUNTER — Other Ambulatory Visit: Payer: Self-pay

## 2022-11-04 ENCOUNTER — Encounter (HOSPITAL_BASED_OUTPATIENT_CLINIC_OR_DEPARTMENT_OTHER): Payer: Self-pay

## 2022-11-04 ENCOUNTER — Emergency Department (HOSPITAL_BASED_OUTPATIENT_CLINIC_OR_DEPARTMENT_OTHER): Payer: Medicare Other | Admitting: Radiology

## 2022-11-04 DIAGNOSIS — I700 Atherosclerosis of aorta: Secondary | ICD-10-CM | POA: Diagnosis not present

## 2022-11-04 DIAGNOSIS — Z853 Personal history of malignant neoplasm of breast: Secondary | ICD-10-CM | POA: Diagnosis not present

## 2022-11-04 DIAGNOSIS — I10 Essential (primary) hypertension: Secondary | ICD-10-CM | POA: Insufficient documentation

## 2022-11-04 DIAGNOSIS — Z79899 Other long term (current) drug therapy: Secondary | ICD-10-CM | POA: Diagnosis not present

## 2022-11-04 DIAGNOSIS — R9431 Abnormal electrocardiogram [ECG] [EKG]: Secondary | ICD-10-CM | POA: Diagnosis not present

## 2022-11-04 DIAGNOSIS — R109 Unspecified abdominal pain: Secondary | ICD-10-CM | POA: Diagnosis not present

## 2022-11-04 DIAGNOSIS — Z7982 Long term (current) use of aspirin: Secondary | ICD-10-CM | POA: Diagnosis not present

## 2022-11-04 DIAGNOSIS — R1013 Epigastric pain: Secondary | ICD-10-CM | POA: Diagnosis present

## 2022-11-04 DIAGNOSIS — K219 Gastro-esophageal reflux disease without esophagitis: Secondary | ICD-10-CM | POA: Insufficient documentation

## 2022-11-04 LAB — URINALYSIS, W/ REFLEX TO CULTURE (INFECTION SUSPECTED)
Bacteria, UA: NONE SEEN
Bilirubin Urine: NEGATIVE
Glucose, UA: NEGATIVE mg/dL
Hgb urine dipstick: NEGATIVE
Ketones, ur: NEGATIVE mg/dL
Leukocytes,Ua: NEGATIVE
Nitrite: NEGATIVE
Protein, ur: NEGATIVE mg/dL
Specific Gravity, Urine: <1.005 — ABNORMAL LOW (ref 1.005–1.030)
pH: 7 (ref 5.0–8.0)

## 2022-11-04 LAB — CBC WITH DIFFERENTIAL/PLATELET
Abs Immature Granulocytes: 0.01 10*3/uL (ref 0.00–0.07)
Basophils Absolute: 0 10*3/uL (ref 0.0–0.1)
Basophils Relative: 0 %
Eosinophils Absolute: 0.1 10*3/uL (ref 0.0–0.5)
Eosinophils Relative: 2 %
HCT: 35.1 % — ABNORMAL LOW (ref 36.0–46.0)
Hemoglobin: 11.4 g/dL — ABNORMAL LOW (ref 12.0–15.0)
Immature Granulocytes: 0 %
Lymphocytes Relative: 23 %
Lymphs Abs: 1.2 10*3/uL (ref 0.7–4.0)
MCH: 28.6 pg (ref 26.0–34.0)
MCHC: 32.5 g/dL (ref 30.0–36.0)
MCV: 88 fL (ref 80.0–100.0)
Monocytes Absolute: 0.6 10*3/uL (ref 0.1–1.0)
Monocytes Relative: 11 %
Neutro Abs: 3.4 10*3/uL (ref 1.7–7.7)
Neutrophils Relative %: 64 %
Platelets: 307 10*3/uL (ref 150–400)
RBC: 3.99 MIL/uL (ref 3.87–5.11)
RDW: 13.2 % (ref 11.5–15.5)
WBC: 5.4 10*3/uL (ref 4.0–10.5)
nRBC: 0 % (ref 0.0–0.2)

## 2022-11-04 LAB — COMPREHENSIVE METABOLIC PANEL
ALT: 13 U/L (ref 0–44)
AST: 17 U/L (ref 15–41)
Albumin: 4.3 g/dL (ref 3.5–5.0)
Alkaline Phosphatase: 78 U/L (ref 38–126)
Anion gap: 11 (ref 5–15)
BUN: 15 mg/dL (ref 8–23)
CO2: 26 mmol/L (ref 22–32)
Calcium: 10 mg/dL (ref 8.9–10.3)
Chloride: 101 mmol/L (ref 98–111)
Creatinine, Ser: 0.6 mg/dL (ref 0.44–1.00)
GFR, Estimated: >60 mL/min (ref >60)
Glucose, Bld: 87 mg/dL (ref 70–99)
Potassium: 3.9 mmol/L (ref 3.5–5.1)
Sodium: 138 mmol/L (ref 135–145)
Total Bilirubin: 0.4 mg/dL (ref 0.3–1.2)
Total Protein: 8 g/dL (ref 6.5–8.1)

## 2022-11-04 LAB — LIPASE, BLOOD: Lipase: 48 U/L (ref 11–51)

## 2022-11-04 LAB — TROPONIN I (HIGH SENSITIVITY)
Troponin I (High Sensitivity): 4 ng/L (ref <18)
Troponin I (High Sensitivity): 5 ng/L (ref <18)

## 2022-11-04 MED ORDER — ONDANSETRON HCL 4 MG PO TABS: 4.0000 mg | ORAL_TABLET | Freq: Four times a day (QID) | ORAL | 0 refills | Status: DC

## 2022-11-04 MED ORDER — FLUCONAZOLE 200 MG PO TABS: 200.0000 mg | ORAL_TABLET | Freq: Every day | ORAL | 0 refills | Status: AC

## 2022-11-04 MED ORDER — MAALOX MAX 400-400-40 MG/5ML PO SUSP: 15.0000 mL | Freq: Four times a day (QID) | ORAL | 0 refills | Status: DC | PRN

## 2022-11-04 MED ORDER — ALUM & MAG HYDROXIDE-SIMETH 200-200-20 MG/5ML PO SUSP
30.0000 mL | Freq: Once | ORAL | Status: AC
  Administered 2022-11-04: 30 mL via ORAL
  Filled 2022-11-04: qty 30

## 2022-11-04 MED ORDER — FAMOTIDINE IN NACL 20-0.9 MG/50ML-% IV SOLN
20.0000 mg | Freq: Once | INTRAVENOUS | Status: AC
  Administered 2022-11-04: 20 mg via INTRAVENOUS
  Filled 2022-11-04: qty 50

## 2022-11-04 MED ORDER — LIDOCAINE VISCOUS HCL 2 % MT SOLN: 15.0000 mL | Freq: Four times a day (QID) | OROMUCOSAL | 0 refills | Status: DC | PRN

## 2022-11-04 NOTE — ED Provider Notes (Signed)
[name] EM[facility]Y [facility]MENT AT [facility] Provider Note   CSN: [id number] Arrival date & time: 11/04/22  1324     History  Chief Complaint  Patient presents with   Gastroesophageal Reflux    Lori [name] is a 82 y.o. female.  Lori [name] is a 82 y.o. female with hx of GERD, hiatal hernia, barrett's esophagus, PMR, HTN, HLD, breast cancer, who presents to the ED for evaluation of pain in her throat chest and epigastric region for the last 4 days. Patient reports this feels similar to the pain she was experiencing when she was initially diagnosed with reflux and barrett's esophagus, but has been well controlled for years with Protonix 40 mg each morning. She is mindful to avoid acidic foods but reports she has been very uncomfortable for the past 4 days, last night she could not sleep. Pain does not radiate, and is not worse with exertion. No nausea or vomiting. She is followed by Dr. Russella Dar with GI but has not called him regarding the symptoms.  Tried Tums without relief.  She reports she has become increasingly worried about the symptoms, she reports that her husband is supposed to have cataract surgery next week but she is worried she will not be able to care for him due to these symptoms.  She reports she was recently treated for a UTI and was on 2 different antibiotics initially Keflex followed by Bactrim which she completed just prior to the symptoms starting.  She reports that she had a repeat urinalysis with her primary care doctor that showed infection had resolved but she is starting to have a little bit of burning and pressure since then.  She also mentions that she recently was placed on atorvastatin for cholesterol management after she did not tolerate rosuvastatin and wondered if this could be contributing.  Is not having similar body aches like she was having with the rosuvastatin.  The history is provided by the patient, the spouse and medical records.        Home Medications Prior to Admission medications   Medication Sig Start Date End Date Taking? Authorizing Provider  atorvastatin (LIPITOR) 20 MG tablet Take 1 tablet (20 mg total) by mouth once a week. 10/27/22   Shelva Majestic, MD  amLODipine (NORVASC) 5 MG tablet TAKE 1 TABLET BY MOUTH ONCE DAILY 10/20/22   Shelva Majestic, MD  aspirin 81 MG tablet Take 81 mg by mouth daily. Evening    [provider]  cholecalciferol (VITAMIN D3) 25 MCG (1000 UNIT) tablet Take 1,000 Units by mouth daily. Take 1 tablet daily    [provider]  cyanocobalamin 1000 MCG tablet Take 1,000 mcg by mouth once a week.    [provider]  letrozole (FEMARA) 2.5 MG tablet TAKE 1 TABLET BY MOUTH EVERY DAY 08/11/22   Serena Croissant, MD  loratadine (CLARITIN) 10 MG tablet Take 10 mg by mouth daily. Take 1/2 tablet daily    [provider]  montelukast (SINGULAIR) 10 MG tablet TAKE 1 TABLET BY MOUTH NIGHTLY AT BEDTIME 05/24/22   Shelva Majestic, MD  ondansetron (ZOFRAN-ODT) 4 MG disintegrating tablet Take 1 tablet (4 mg total) by mouth every 8 (eight) hours as needed for nausea or vomiting. Patient not taking: Reported on 10/14/2022 01/06/22   Shelva Majestic, MD  pantoprazole (PROTONIX) 40 MG tablet TAKE 1 TABLET BY MOUTH EVERY DAY 05/24/22   Shelva Majestic, MD  sulfamethoxazole-trimethoprim (BACTRIM DS) 800-160 MG tablet  Take 1 tablet by mouth 2 (two) times daily. 10/14/22   Shelva Majestic, MD      Allergies    Azithromycin, Bacitracin-polymyxin b, Hydrocodone-acetaminophen, Klonopin [clonazepam], Neomycin, Nitrofurantoin, Penicillins, Pneumococcal vaccine polyvalent, Pneumovax [pneumococcal polysaccharide vaccine], and Triamcinolone    Review of Systems   Review of Systems  Constitutional:  Negative for chills and fever.  HENT: Negative.    Respiratory:  Negative for cough and shortness of breath.   Cardiovascular:  Positive for chest pain.  Gastrointestinal:   Positive for abdominal pain. Negative for blood in stool, diarrhea, nausea and vomiting.    Physical Exam Updated Vital Signs BP (!) 156/72   Pulse 85   Temp 98.4 F (36.9 C)   Resp 13   SpO2 98%  Physical Exam Vitals and nursing note reviewed.  Constitutional:      General: She is not in acute distress.    Appearance: Normal appearance. She is well-developed and normal weight. She is not ill-appearing or diaphoretic.  HENT:     Head: Normocephalic and atraumatic.     Mouth/Throat:     Mouth: Mucous membranes are moist.     Pharynx: Oropharynx is clear.     Comments: No white coating or patches on the tongue, posterior oropharynx with mild erythema, no tonsillar edema, erythema or exudates present Eyes:     General:        Right eye: No discharge.        Left eye: No discharge.     Pupils: Pupils are equal, round, and reactive to light.  Cardiovascular:     Rate and Rhythm: Normal rate and regular rhythm.     Pulses: Normal pulses.     Heart sounds: Normal heart sounds.  Pulmonary:     Effort: Pulmonary effort is normal. No respiratory distress.     Breath sounds: Normal breath sounds. No wheezing or rales.     Comments: Respirations equal and unlabored, patient able to speak in full sentences, lungs clear to auscultation bilaterally  Abdominal:     General: Bowel sounds are normal. There is no distension.     Palpations: Abdomen is soft. There is no mass.     Tenderness: There is no abdominal tenderness. There is no guarding.     Comments: Abdomen soft, nondistended, nontender to palpation in all quadrants without guarding or peritoneal signs  Musculoskeletal:        General: No deformity.     Cervical back: Neck supple.  Skin:    General: Skin is warm and dry.     Capillary Refill: Capillary refill takes less than 2 seconds.  Neurological:     Mental Status: She is alert and oriented to person, place, and time.     Coordination: Coordination normal.     Comments:  Speech is clear, able to follow commands Moves extremities without ataxia, coordination intact  Psychiatric:        Mood and Affect: Mood is anxious.        Behavior: Behavior normal.     ED Results / Procedures / Treatments   Labs (all labs ordered are listed, but only abnormal results are displayed) Labs Reviewed  CBC WITH DIFFERENTIAL/PLATELET - Abnormal; Notable for the following components:      Result Value   Hemoglobin 11.4 (*)    HCT 35.1 (*)    All other components within normal limits  COMPREHENSIVE METABOLIC PANEL  LIPASE, BLOOD  URINALYSIS, W/ REFLEX TO CULTURE (INFECTION SUSPECTED)  TROPONIN I (HIGH SENSITIVITY)    EKG EKG Interpretation Date/Time:  Thursday November 04 2022 09:20:41 EDT Ventricular Rate:  79 PR Interval:  187 QRS Duration:  87 QT Interval:  378 QTC Calculation: 434 R Axis:   44  Text Interpretation: Sinus rhythm Confirmed by Cathren Laine ([id number]) on 11/04/2022 9:25:47 AM  Radiology DG Abd Acute W/Chest  Result Date: 11/04/2022 CLINICAL DATA:  Has a refill onset 4 days ago with burning and pain EXAM: DG ABDOMEN ACUTE WITH 1 VIEW CHEST COMPARISON:  Chest x-ray 01/18/2020 FINDINGS: Hyperinflation. No consolidation, pneumothorax or effusion. No edema. Chronic lung changes identified. Normal cardiopericardial silhouette. Calcified aorta. Overlapping cardiac leads. Surgical clips overlie the right hemithorax. Moderate colonic stool. Gas is seen in nondilated loops of small and large bowel. No obstruction. No definite free air seen beneath the diaphragm on the upright view. Vascular calcifications are seen. Possible renal stones. IMPRESSION: Hyperinflation with chronic lung changes. No acute cardiopulmonary disease. Nonspecific bowel gas pattern with scattered stool. Possible kidney stones. Electronically Signed   By: Karen Kays M.D.   On: 11/04/2022 10:42    Procedures Procedures    Medications Ordered in ED Medications  famotidine (PEPCID) IVPB  20 mg premix (0 mg Intravenous Stopped 11/04/22 1043)  alum & mag hydroxide-simeth (MAALOX/MYLANTA) 200-200-20 MG/5ML suspension 30 mL (30 mLs Oral Given 11/04/22 7829)    ED Course/ Medical Decision Making/ A&P                                 Medical Decision Making Amount and/or Complexity of Data Reviewed Labs: ordered. Radiology: ordered.  Risk OTC drugs. Prescription drug management.   82 y.o. female presents to the ED with complaints of burning discomfort in the center of her chest and epigastric region, this involves an extensive number of treatment options, and is a complaint that carries with it a high risk of complications and morbidity.  The differential diagnosis includes GERD, esophagitis, gastritis, peptic ulcer disease, ACS, musculoskeletal chest pain, pancreatitis, anxiety.  On arrival pt is nontoxic, vitals significant for very mildly elevated blood pressure but otherwise vitals normal.   Additional history obtained from husband at bedside. Previous records obtained and reviewed   I ordered medication including Pepcid and Maalox suspension for possible GERD/esophagitis  Lab Tests:  I Ordered, reviewed, and interpreted labs, which included: No leukocytosis, stable hemoglobin, no electrolyte derangements, normal lipase, troponin negative x 2, UA without signs of infection  EKG: Normal sinus rhythm without any ischemic changes  Imaging Studies ordered:  I ordered imaging studies which included acute abdomen with chest, I independently visualized and interpreted imaging which showed chronic hyperinflation of the lungs with no acute cardiopulmonary disease, no free air under the diaphragm, nonspecific bowel gas pattern.  ED Course:   Patient with longstanding history of acid reflux previously managed with once daily Protonix, reassuring workup today, more suspicious for GERD or esophagitis.  Patient was been on antibiotics multiple times recently for UTI symptoms started  shortly after finishing a course of antibiotics, had faint white coating on the tongue, question whether patient could also have candidal esophagitis.  In addition to increasing PPI to twice daily will prescribe patient course of fluconazole and encouraged her to follow-up very closely with her GI doctor.  Discussed strict return precautions.  Patient feeling much better after treatment here in the ED and discharged home.    Portions of this note were  generated with Scientist, clinical (histocompatibility and immunogenetics). Dictation errors may occur despite best attempts at proofreading.         Final Clinical Impression(s) / ED Diagnoses Final diagnoses:  Gastroesophageal reflux disease, unspecified whether esophagitis present    Rx / DC Orders ED Discharge Orders          Ordered    fluconazole (DIFLUCAN) 200 MG tablet  Daily        11/04/22 1359    alum & mag hydroxide-simeth (MAALOX MAX) 400-400-40 MG/5ML suspension  Every 6 hours PRN        11/04/22 1359    lidocaine (XYLOCAINE) 2 % solution  Every 6 hours PRN        11/04/22 1359    ondansetron (ZOFRAN) 4 MG tablet  Every 6 hours        11/04/22 1430              Dartha Lodge, [place] 11/17/22 1636    Cathren Laine, MD 11/17/22 2000

## 2022-11-04 NOTE — ED Triage Notes (Signed)
Pt c/o acid reflux onset 4 days ago, states she feels burning "all up & down." Denies additional symptoms Hx barrett's esophagus, GERD

## 2022-11-04 NOTE — ED Notes (Signed)
Discharge paperwork given and verbally understood. 

## 2022-11-04 NOTE — Discharge Instructions (Addendum)
Given you were recently on 2 different antibiotics you are at increased risk for potential inflammation of your esophagus from yeast.  Please take fluconazole 1 tablet daily for 14 days to help treat for this.  In the meantime you can continue to manage her symptoms by increasing your pantoprazole to twice daily in the morning and nighttime.  You can use prescribed Maalox as needed for breakthrough symptoms as well as prescribed lidocaine to help with pain.  Continue avoiding acidic foods in your diet.  Please call Dr. Ardell Isaacs office today to schedule close follow-up appointment.

## 2022-11-04 NOTE — Telephone Encounter (Signed)
Patient called stated she was just seen at the ED and advised her to follow up with Dr. Russella Dar. Seeking further advise.

## 2022-11-04 NOTE — ED Notes (Signed)
 Pt is aware of the need for a urine... Unable to currently provide the sample.Lori KitchenMarland [name]

## 2022-11-05 NOTE — Telephone Encounter (Addendum)
The pt was seen in the ED for reflux symptoms that have worsened.  She was last seen by Dr Russella Dar in 2021.  She was told to f/u with Dr Russella Dar within a week.  We had a cancellation for 11/09/22 830 am. The pt agrees to this appt.  She will continue to follow the ED recommendations until that appt. The note is not yet complete from that visit however she states she is taking protonix 40 mg daily.  She has been advised to follow anti reflux precautions and keep appt.  The pt has been advised of the information and verbalized understanding.

## 2022-11-09 ENCOUNTER — Ambulatory Visit: Payer: Medicare Other | Admitting: Gastroenterology

## 2022-11-09 ENCOUNTER — Other Ambulatory Visit: Payer: Self-pay

## 2022-11-09 ENCOUNTER — Encounter: Payer: Self-pay | Admitting: Gastroenterology

## 2022-11-09 ENCOUNTER — Other Ambulatory Visit (INDEPENDENT_AMBULATORY_CARE_PROVIDER_SITE_OTHER): Payer: Medicare Other

## 2022-11-09 VITALS — BP 124/70 | HR 94 | Ht 64.0 in | Wt 109.0 lb

## 2022-11-09 DIAGNOSIS — K219 Gastro-esophageal reflux disease without esophagitis: Secondary | ICD-10-CM

## 2022-11-09 DIAGNOSIS — K2270 Barrett's esophagus without dysplasia: Secondary | ICD-10-CM

## 2022-11-09 DIAGNOSIS — R1013 Epigastric pain: Secondary | ICD-10-CM

## 2022-11-09 LAB — FOLATE: Folate: 21.3 ng/mL (ref >5.9)

## 2022-11-09 LAB — IBC + FERRITIN
Ferritin: 50.8 ng/mL (ref 10.0–291.0)
Iron: 47 ug/dL (ref 42–145)
Saturation Ratios: 11.6 % — ABNORMAL LOW (ref 20.0–50.0)
TIBC: 404.6 ug/dL (ref 250.0–450.0)
Transferrin: 289 mg/dL (ref 212.0–360.0)

## 2022-11-09 LAB — VITAMIN B12: Vitamin B-12: 816 pg/mL (ref 211–911)

## 2022-11-09 MED ORDER — POLYSACCHARIDE IRON COMPLEX 150 MG PO CAPS: 150.0000 mg | ORAL_CAPSULE | Freq: Every day | ORAL | 0 refills | Status: DC

## 2022-11-09 NOTE — Patient Instructions (Signed)
Your provider has requested that you go to the basement level for lab work before leaving today. Press "B" on the elevator. The lab is located at the first door on the left as you exit the elevator.  You have been scheduled for an endoscopy. Please follow written instructions given to you at your visit today.  If you use inhalers (even only as needed), please bring them with you on the day of your procedure.  If you take any of the following medications, they will need to be adjusted prior to your procedure:   DO NOT TAKE 7 DAYS PRIOR TO TEST- Trulicity (dulaglutide) Ozempic, Wegovy (semaglutide) Mounjaro (tirzepatide) Bydureon Bcise (exanatide extended release)  DO NOT TAKE 1 DAY PRIOR TO YOUR TEST Rybelsus (semaglutide) Adlyxin (lixisenatide) Victoza (liraglutide) Byetta (exanatide) ___________________________________________________________________________  The [place] [facility] providers would like to encourage you to use [facility] to communicate with providers for non-urgent requests or questions.  Due to long hold times on the telephone, sending your provider a message by [facility] may be a faster and more efficient way to get a response.  Please allow 48 business hours for a response.  Please remember that this is for non-urgent requests.   Due to recent changes in healthcare laws, you may see the results of your imaging and laboratory studies on MyChart before your provider has had a chance to review them.  We understand that in some cases there may be results that are confusing or concerning to you. Not all laboratory results come back in the same time frame and the provider may be waiting for multiple results in order to interpret others.  Please give [place] 48 hours in order for your provider to thoroughly review all the results before contacting the office for clarification of your results.   Thank you for choosing me and  Gastroenterology.  Lori [name]. Pleas Koch., MD., Clementeen Graham

## 2022-11-09 NOTE — Progress Notes (Signed)
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  Tums prn.  Follow antireflux measures. Schedule EGD. The risks (including bleeding, perforation, infection, missed lesions, medication reactions and possible hospitalization or surgery if complications occur), benefits, and alternatives to endoscopy with possible biopsy and possible dilation were discussed with the patient and they consent to proceed.   Consider CT AP if a diagnosis is not established Fe, TIBC, ferritin, B12, folate   HPI   Chief complaint: Epigastric pain, weight loss, GERD, Barrett's esophagus  Patient profile:  Lori [name] is a 82 y.o. female referred by Shelva Majestic, MD with GERD and Barrett's esophagus. She is accompanied by her son. She was evaluated in the ED on August 15 for throat, chest and epigastric pain for 4 days.  She relates her symptoms were bothersome at night and impacted her sleep.  She reports she was treated with 2 different antibiotics for a UTI and had just completed the course of antibiotics when her symptoms began.  She is being treated with a 14-day course of Diflucan for possible esophageal candidiasis.  Generally her reflux symptoms have been well-controlled on daily pantoprazole.  Her current complaint is epigastric pain and weight loss.  She cannot easily quantify her weight loss however her weight is 8 pounds lower compared to a July 22 office visit.  She relates early satiety and intermittent nausea.  Her last EGD was in August 2021 as below.  Hemoglobin 11.4, MCV 88 noted on ED blood work.  Denies constipation, diarrhea, change  in stool caliber, melena, hematochezia, vomiting, dysphagia.    Previous Labs / Imaging::    Latest Ref Rng & Units 11/04/2022    9:26 AM 08/23/2022    9:13 AM 05/12/2022   11:04 AM  CBC  WBC 4.0 - 10.5 K/uL 5.4  7.2  5.3   Hemoglobin 12.0 - 15.0 g/dL 59.5  63.8  75.6   Hematocrit 36.0 - 46.0 % 35.1  38.3  38.4   Platelets 150 - 400 K/uL 307  297  248.0     Lab Results  Component Value Date   LIPASE 48 11/04/2022      Latest Ref Rng & Units 11/04/2022    9:26 AM 08/23/2022    9:13 AM 05/12/2022   11:04 AM  CMP  Glucose 70 - 99 mg/dL 87  86  433   BUN 8 - 23 mg/dL 15  22  24    Creatinine 0.44 - 1.00 mg/dL 2.95  1.88  4.16   Sodium 135 - 145 mmol/L 138  139  141   Potassium 3.5 - 5.1 mmol/L 3.9  4.1  3.8   Chloride 98 - 111 mmol/L 101  104  104   CO2 22 - 32 mmol/L 26  29  24    Calcium 8.9 - 10.3 mg/dL 60.6  30.1  60.1   Total Protein 6.5 - 8.1 g/dL 8.0  7.9  7.4   Total Bilirubin 0.3 - 1.2 mg/dL 0.4  0.4  0.4   Alkaline Phos 38 - 126 U/L 78  90  67   AST 15 - 41 U/L 17  18  21    ALT 0 -  44 U/L 13  12  14       Previous GI evaluation    Endoscopies:  EGD Aug 2021 - Esophageal mucosal changes secondary to established short-segment Barrett's disease. Biopsied.  - Small hiatal hernia.  - A few gastric polyps.  - Normal duodenal bulb and second portion of the duodenum  Imaging:  DG Abd Acute W/Chest CLINICAL DATA:  Has a refill onset 4 days ago with burning and pain  EXAM: DG ABDOMEN ACUTE WITH 1 VIEW CHEST  COMPARISON:  Chest x-ray 01/18/2020  FINDINGS: Hyperinflation. No consolidation, pneumothorax or effusion. No edema. Chronic lung changes identified. Normal cardiopericardial silhouette. Calcified aorta. Overlapping cardiac leads. Surgical clips overlie the right hemithorax.  Moderate colonic stool. Gas is seen in nondilated loops of small and large bowel. No obstruction. No definite free air seen beneath the diaphragm on the upright view. Vascular  calcifications are seen. Possible renal stones.  IMPRESSION: Hyperinflation with chronic lung changes. No acute cardiopulmonary disease.  Nonspecific bowel gas pattern with scattered stool.  Possible kidney stones.  Electronically Signed   By: Karen Kays M.D.   On: 11/04/2022 10:42    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    Eczema    Family history of breast cancer    Family history of kidney cancer    Family history of multiple myeloma    Family history of thyroid cancer    GERD (gastroesophageal reflux disease)    Headache(784.0)    History of IBS    History of kidney stones    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    Hyperlipidemia    on medication   Hypertension    Low back pain    Meniere disease    Neuromuscular disorder (HCC)    raynaud's   Osteopenia    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HCC)    Stroke (HCC) 1988   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 carotid artery dissection    Tubular adenoma of colon 02/2013   Varicose veins with inflammation    upper and lower per pt    Vasculitis [facility])    Past Surgical History:  Procedure Laterality Date   arthroscopic knee  2009   left knee/ torn meniscus   BREAST LUMPECTOMY WITH RADIOACTIVE SEED LOCALIZATION Right 11/05/2020   Procedure: RIGHT BREAST LUMPECTOMY WITH RADIOACTIVE SEED LOCALIZATION;  Surgeon: Harriette Bouillon, MD;  Location: [facility];  Service: General;  Laterality: Right;   BUNIONECTOMY Right 1998   with other foot surgery    carotid artery disection  1988   Carotid  Artery Dissection   CATARACT EXTRACTION, BILATERAL  07-18-2017,08-08-2017   CESAREAN SECTION  1988   1 time   COLONOSCOPY  2019   last 2019   DILATION AND CURETTAGE OF UTERUS  2004   EYE SURGERY     EYE SURGERY  09/25/2021   lowere  lid of left eye   POLYPECTOMY     POPLITEAL SYNOVIAL CYST EXCISION     left leg   TONSILLECTOMY  1957   UPPER GASTROINTESTINAL ENDOSCOPY     last 2018   Family History  Problem Relation Age of Onset   Multiple myeloma Father 43   Thyroid cancer Sister 79       s/p removal. papilary and anaplastic.    Lung cancer Sister    Kidney disease Brother        cancer- removed   Kidney cancer Brother        dx early 11s   Alzheimer's disease Brother    Stroke Brother 35   Breast cancer Cousin 8       paternal first cousin   Cancer Cousin        unknown type, paternal first cousin   Breast cancer Other        mother's first cousin   Cervical cancer Other    Colon cancer Neg Hx    Esophageal cancer Neg Hx    Rectal cancer Neg Hx    Stomach cancer Neg Hx    Colon polyps Neg Hx    Social History   Tobacco Use   Smoking status: Never   Smokeless tobacco: Never  Substance Use Topics   Alcohol use: No    Alcohol/week: 0.0 standard drinks of alcohol   Drug use: No   Current Outpatient Medications  Medication Sig Dispense Refill   atorvastatin (LIPITOR) 20 MG tablet Take 1 tablet (20 mg total) by mouth once a week. 13 tablet 3   alum & mag hydroxide-simeth (MAALOX MAX) 400-400-40 MG/5ML suspension Take 15 mLs by mouth every 6 (six) hours as needed for indigestion. 355 mL 0   amLODipine (NORVASC) 5 MG tablet TAKE 1 TABLET BY MOUTH ONCE DAILY 30 tablet 5   aspirin 81 MG tablet Take 81 mg by mouth daily. Evening     cholecalciferol (VITAMIN D3) 25 MCG (1000 UNIT) tablet Take 1,000 Units by mouth daily. Take 1 tablet daily     cyanocobalamin 1000 MCG tablet Take 1,000 mcg by mouth once a week.     fluconazole (DIFLUCAN) 200 MG tablet Take 1 tablet (200  mg total) by mouth daily for 14 days. 14 tablet 0   letrozole (FEMARA) 2.5 MG tablet TAKE 1 TABLET BY MOUTH EVERY DAY 90 tablet 3   lidocaine (XYLOCAINE) 2 % solution Use as directed 15 mLs in the mouth or throat every 6 (six) hours as needed for mouth pain. 300 mL 0   loratadine (CLARITIN) 10 MG tablet Take 10 mg by mouth daily. Take 1/2 tablet daily     montelukast (SINGULAIR) 10 MG tablet TAKE 1 TABLET BY MOUTH NIGHTLY AT BEDTIME 90 tablet 1   ondansetron (ZOFRAN) 4 MG tablet Take 1 tablet (4 mg total) by mouth every 6 (six) hours. 12 tablet 0   ondansetron (ZOFRAN-ODT) 4 MG disintegrating tablet Take 1 tablet (4 mg total) by mouth every 8 (eight) hours as needed for nausea or vomiting. (Patient not taking: Reported on 10/14/2022) 10 tablet 0   pantoprazole (PROTONIX) 40 MG tablet TAKE 1 TABLET BY MOUTH EVERY DAY 90 tablet 1   sulfamethoxazole-trimethoprim (BACTRIM DS) 800-160 MG tablet Take 1 tablet by mouth 2 (two) times daily. 14 tablet 0   No current facility-administered medications for this visit.   Allergies  Allergen Reactions   Azithromycin Other (See Comments)    Ginette Pitman  Bacitracin-Polymyxin B Itching    Burning and runny eye    Hydrocodone-Acetaminophen     VOMITING   Klonopin [Clonazepam]     -august 27th- took one dose and experienced zombie like sensation- states will never take agian   Neomycin    Nitrofurantoin     Reports tingling on tongue- no swelling   Penicillins     REACTION: Arm swelling   Pneumococcal Vaccine Polyvalent     ARM REDNESS WITH TENDERNESS   Pneumovax [Pneumococcal Polysaccharide Vaccine]    Triamcinolone Swelling    Lip,tongue and nose burning. Triamcinolone Cream 0.1%    Review of Systems: All other systems reviewed and negative except where noted in HPI.    Physical Exam    Wt Readings from Last 3 Encounters:  10/14/22 117 lb (53.1 kg)  10/11/22 117 lb 3.2 oz (53.2 kg)  09/16/22 116 lb 12.8 oz (53 kg)    There were no vitals  taken for this visit. Constitutional:  Generally well appearing female in no acute distress. HEENT: Pupils normal.  Conjunctivae are normal. No scleral icterus. No oral lesions or deformities noted.  Neck: Supple.  Cardiac: Normal rate, regular rhythm without murmurs. Pulmonary/chest: Effort normal and breath sounds normal. No wheezing, rales or rhonchi. Abdominal: Soft, nondistended, nontender. Active bowel sounds. No palpable HSM, masses or hernias. Rectal: Not done Extremities: No edema or deformities noted Neurological: Alert and oriented to person, place and time. Psychiatric: Pleasant. Very anxious. Skin: Skin is warm and dry. No rashes noted.  Claudette Head, MD   cc:  Referring Provider Shelva Majestic, MD

## 2022-11-10 ENCOUNTER — Other Ambulatory Visit: Payer: Self-pay | Admitting: Family Medicine

## 2022-11-11 ENCOUNTER — Telehealth: Payer: Self-pay

## 2022-11-11 NOTE — Telephone Encounter (Signed)
Transition Care Management Follow-up Telephone Call Date of discharge and from where: 11/04/2022 [facility] How have you been since you were released from the hospital? Patient stated she is still having issues with reflux. She is very grateful for the excellent care she received, everyone was very kind and attentive. Any questions or concerns? No  Items Reviewed: Did the pt receive and understand the discharge instructions provided? Yes  Medications obtained and verified? Yes  Other? No  Any new allergies since your discharge? No  Dietary orders reviewed? Yes Do you have support at home? Yes   Follow up appointments reviewed:  PCP Hospital f/u appt confirmed? No  Scheduled to see  on  @ . Specialist Hospital f/u appt confirmed? Yes  Scheduled to see Judie Petit T. Russella Dar, MD on 11/09/2022 @ [facility]. Are transportation arrangements needed? No  If their condition worsens, is the pt aware to call PCP or go to the Emergency Dept.? Yes Was the patient provided with contact information for the PCP's office or ED? Yes Was to pt encouraged to call back with questions or concerns? Yes  Israel Werts [facility]  [facility] Population Health Community Resource Care Guide   [email or web address]  ?? [phone]   Website: [email or web address]  [email or web address]

## 2022-11-15 ENCOUNTER — Other Ambulatory Visit: Payer: Self-pay | Admitting: Family Medicine

## 2022-11-16 DIAGNOSIS — Z853 Personal history of malignant neoplasm of breast: Secondary | ICD-10-CM | POA: Diagnosis not present

## 2022-11-16 LAB — HM MAMMOGRAPHY

## 2022-11-17 ENCOUNTER — Other Ambulatory Visit: Payer: Self-pay | Admitting: Family Medicine

## 2022-11-17 ENCOUNTER — Encounter: Payer: Self-pay | Admitting: Family Medicine

## 2022-11-23 ENCOUNTER — Other Ambulatory Visit: Payer: Self-pay | Admitting: Family Medicine

## 2022-12-07 ENCOUNTER — Encounter: Payer: Medicare Other | Admitting: Gastroenterology

## 2022-12-09 ENCOUNTER — Ambulatory Visit (INDEPENDENT_AMBULATORY_CARE_PROVIDER_SITE_OTHER): Payer: Medicare Other | Admitting: Physician Assistant

## 2022-12-09 ENCOUNTER — Encounter: Payer: Self-pay | Admitting: Physician Assistant

## 2022-12-09 VITALS — BP 130/70 | HR 79 | Temp 96.4°F | Wt 112.0 lb

## 2022-12-09 DIAGNOSIS — D509 Iron deficiency anemia, unspecified: Secondary | ICD-10-CM | POA: Diagnosis not present

## 2022-12-09 DIAGNOSIS — R110 Nausea: Secondary | ICD-10-CM

## 2022-12-09 DIAGNOSIS — R42 Dizziness and giddiness: Secondary | ICD-10-CM | POA: Diagnosis not present

## 2022-12-09 DIAGNOSIS — R531 Weakness: Secondary | ICD-10-CM | POA: Diagnosis not present

## 2022-12-09 DIAGNOSIS — R1013 Epigastric pain: Secondary | ICD-10-CM

## 2022-12-09 DIAGNOSIS — I499 Cardiac arrhythmia, unspecified: Secondary | ICD-10-CM | POA: Diagnosis not present

## 2022-12-09 LAB — COMPREHENSIVE METABOLIC PANEL
ALT: 12 U/L (ref 0–35)
AST: 17 U/L (ref 0–37)
Albumin: 4.2 g/dL (ref 3.5–5.2)
Alkaline Phosphatase: 86 U/L (ref 39–117)
BUN: 18 mg/dL (ref 6–23)
CO2: 29 mEq/L (ref 19–32)
Calcium: 9.9 mg/dL (ref 8.4–10.5)
Chloride: 102 mEq/L (ref 96–112)
Creatinine, Ser: 0.67 mg/dL (ref 0.40–1.20)
GFR: 82.36 mL/min (ref >60.00)
Glucose, Bld: 82 mg/dL (ref 70–99)
Potassium: 3.7 mEq/L (ref 3.5–5.1)
Sodium: 140 mEq/L (ref 135–145)
Total Bilirubin: 0.4 mg/dL (ref 0.2–1.2)
Total Protein: 7.9 g/dL (ref 6.0–8.3)

## 2022-12-09 LAB — CBC WITH DIFFERENTIAL/PLATELET
Basophils Absolute: 0 10*3/uL (ref 0.0–0.1)
Basophils Relative: 0.5 % (ref 0.0–3.0)
Eosinophils Absolute: 0.2 10*3/uL (ref 0.0–0.7)
Eosinophils Relative: 2.5 % (ref 0.0–5.0)
HCT: 40.1 % (ref 36.0–46.0)
Hemoglobin: 12.9 g/dL (ref 12.0–15.0)
Lymphocytes Relative: 24 % (ref 12.0–46.0)
Lymphs Abs: 1.5 10*3/uL (ref 0.7–4.0)
MCHC: 32.2 g/dL (ref 30.0–36.0)
MCV: 86.4 fl (ref 78.0–100.0)
Monocytes Absolute: 0.8 10*3/uL (ref 0.1–1.0)
Monocytes Relative: 12.9 % — ABNORMAL HIGH (ref 3.0–12.0)
Neutro Abs: 3.7 10*3/uL (ref 1.4–7.7)
Neutrophils Relative %: 60.1 % (ref 43.0–77.0)
Platelets: 326 10*3/uL (ref 150.0–400.0)
RBC: 4.64 Mil/uL (ref 3.87–5.11)
RDW: 13.8 % (ref 11.5–15.5)
WBC: 6.2 10*3/uL (ref 4.0–10.5)

## 2022-12-09 NOTE — Patient Instructions (Signed)
Labs today - will let you know results  Referral to cardiologist  ER if any severe pain, passing out, fatigue, worst headache of life, chest pain, etc.

## 2022-12-09 NOTE — Progress Notes (Signed)
Subjective:    Patient ID: Lori [name], female    DOB: January 15, 1941, 82 y.o.   MRN: [id number]  Chief Complaint  Patient presents with   Dizziness    She states she has been having issues with balance, dizziness, nausea     HPI Patient is in today for several symptoms, she thinks they may be coming from one of her medications (amlodipine). Her son drove her here today and is waiting outside.   She hasn't been able to drive for a few months due to vertigo-like symptoms. Weak, tired. Yawns often. Nauseated, dizzy often.   Epigastric pain, GERD - ER visit 11/04/22 - following with Dr. Russella Dar and scheduling for EGD (now scheduled for 10/31). Eating extremely healthy, but has also lost weight as a result of this she thinks.   Not depressed, sleeping well. "Just so tired."  She is taking Nu-Iron daily.   Fluttering sensation at times in epigastric region. No chest pain or pressure. Occ feels SOB.  Felt weak / near-syncope with vacuuming recently. Just sitting at the computer one day in ?July or August? (Pt can't recall exactly) and everything went black, then she was fine and told her husband immediately about the incident. No episodes since then.  Today, feels some fluttering pain in epigastric region. No chest pain or pressure. No SOB. No other acute symptoms today.   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    Eczema    Family history of breast cancer    Family history of kidney cancer    Family history of multiple myeloma    Family history of thyroid  cancer    GERD (gastroesophageal reflux disease)    Headache(784.0)    History of IBS    History of kidney stones    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    Hyperlipidemia    on medication   Hypertension    Low back pain    Meniere disease    Neuromuscular disorder (HCC)    raynaud's   Osteopenia    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HCC)    Stroke (HCC) 1988   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 carotid artery dissection    Tubular adenoma of colon 02/2013   Varicose veins with inflammation    upper and lower per pt    Vasculitis [facility])     Past Surgical History:  Procedure Laterality Date   arthroscopic knee  2009   left knee/ torn meniscus   BREAST LUMPECTOMY WITH RADIOACTIVE SEED LOCALIZATION Right 11/05/2020   Procedure: RIGHT BREAST LUMPECTOMY WITH RADIOACTIVE SEED LOCALIZATION;  Surgeon: Harriette Bouillon, MD;  Location: [facility];  Service: General;  Laterality: Right;   BUNIONECTOMY Right 1998   with other foot surgery    carotid artery disection  1988   Carotid Artery Dissection   CATARACT EXTRACTION, BILATERAL  07-18-2017,08-08-2017  CESAREAN SECTION  1988   1 time   COLONOSCOPY  2019   last 2019   DILATION AND CURETTAGE OF UTERUS  2004   EYE SURGERY     EYE SURGERY  09/25/2021   lowere lid of left eye   POLYPECTOMY     POPLITEAL SYNOVIAL CYST EXCISION     left leg   TONSILLECTOMY  1957   UPPER GASTROINTESTINAL ENDOSCOPY     last 2018    Family History  Problem Relation Age of Onset   Multiple myeloma Father 62   Thyroid cancer Sister 44       s/p removal. papilary and anaplastic.    Lung cancer Sister    Kidney disease Brother        cancer- removed   Kidney cancer Brother        dx early 66s   Alzheimer's disease Brother    Stroke Brother 66   Breast cancer Cousin 78       paternal first cousin   Cancer Cousin        unknown type, paternal first cousin   Breast cancer Other        mother's first cousin   Cervical cancer Other    Colon cancer  Neg Hx    Esophageal cancer Neg Hx    Rectal cancer Neg Hx    Stomach cancer Neg Hx    Colon polyps Neg Hx     Social History   Tobacco Use   Smoking status: Never   Smokeless tobacco: Never  Vaping Use   Vaping status: Never Used  Substance Use Topics   Alcohol use: No    Alcohol/week: 0.0 standard drinks of alcohol   Drug use: No     Allergies  Allergen Reactions   Azithromycin Other (See Comments)    Thrush    Bacitracin-Polymyxin B Itching    Burning and runny eye    Hydrocodone-Acetaminophen     VOMITING   Klonopin [Clonazepam]     -august 27th- took one dose and experienced zombie like sensation- states will never take agian   Neomycin    Nitrofurantoin     Reports tingling on tongue- no swelling   Penicillins     REACTION: Arm swelling   Pneumococcal Vaccine Polyvalent     ARM REDNESS WITH TENDERNESS   Pneumovax [Pneumococcal Polysaccharide Vaccine]    Triamcinolone Swelling    Lip,tongue and nose burning. Triamcinolone Cream 0.1%    Review of Systems NEGATIVE UNLESS OTHERWISE INDICATED IN HPI      Objective:     BP 130/70   Pulse 79   Temp (!) 96.4 F (35.8 C) (Temporal)   Wt 112 lb (50.8 kg)   SpO2 98%   BMI 19.22 kg/m   Wt Readings from Last 3 Encounters:  12/09/22 112 lb (50.8 kg)  11/09/22 109 lb (49.4 kg)  10/14/22 117 lb (53.1 kg)    BP Readings from Last 3 Encounters:  12/09/22 130/70  11/09/22 124/70  11/04/22 (!) 151/62     Physical Exam Vitals and nursing note reviewed.  Constitutional:      Appearance: Normal appearance. She is normal weight. She is not toxic-appearing.     Comments: Using a cane  HENT:     Head: Normocephalic and atraumatic.     Right Ear: Tympanic membrane, ear canal and external ear normal.     Left Ear: Tympanic membrane, ear canal and external ear normal.     Nose:  Nose normal.     Mouth/Throat:     Mouth: Mucous membranes are moist.  Eyes:     Extraocular Movements: Extraocular movements  intact.     Conjunctiva/sclera: Conjunctivae normal.     Pupils: Pupils are equal, round, and reactive to light.  Cardiovascular:     Rate and Rhythm: Normal rate. Rhythm irregular.     Pulses: Normal pulses.     Heart sounds: Normal heart sounds. No murmur heard.    Comments: No carotid bruits  Pulmonary:     Effort: Pulmonary effort is normal.     Breath sounds: Normal breath sounds.  Abdominal:     General: Abdomen is flat. Bowel sounds are normal.     Palpations: Abdomen is soft. There is no mass.     Tenderness: There is abdominal tenderness (epigastric). There is no right CVA tenderness, left CVA tenderness or rebound.     Hernia: No hernia is present.  Musculoskeletal:        General: Normal range of motion.     Cervical back: Normal range of motion and neck supple.     Right lower leg: No edema.     Left lower leg: No edema.  Skin:    General: Skin is warm and dry.  Neurological:     General: No focal deficit present.     Mental Status: She is alert and oriented to person, place, and time.     Cranial Nerves: No cranial nerve deficit.     Sensory: No sensory deficit.     Motor: No weakness.     Coordination: Coordination normal.     Gait: Gait normal.  Psychiatric:        Mood and Affect: Mood is anxious (baseline).        Behavior: Behavior normal.        Thought Content: Thought content normal.        Judgment: Judgment normal.        Assessment & Plan:  Dizziness -     EKG 12-Lead -     CBC with Differential/Platelet -     Comprehensive metabolic panel -     Ambulatory referral to Cardiology  Irregular heartbeat -     EKG 12-Lead -     Ambulatory referral to Cardiology  Weakness generalized -     CBC with Differential/Platelet -     Comprehensive metabolic panel -     Ambulatory referral to Cardiology  Iron deficiency anemia, unspecified iron deficiency anemia type -     CBC with Differential/Platelet -     CT ABDOMEN PELVIS W CONTRAST;  Future  Epigastric pain -     CT ABDOMEN PELVIS W CONTRAST; Future  Nausea -     CT ABDOMEN PELVIS W CONTRAST; Future   Wide differential for patient's concerns today, including but not limited to: anemia, TIA, orthostatic hypotension, gastric ulcer, malignancy, paroxysmal A. Fib, PVCs, CAD, hypoglycemia, carotid stenosis.  Today her vitals are stable, she has no focal neuro deficits, and her orthostatic VS appear well. Reassured patient that I don't see any reason her medications would be causing these issues at this time.  I noted some irregularity to her pulse today. EKG was obtained. Per my review, NSR at 83 bpm, with occasional PVCs. Similar in comparison to prior EKG last month. She is feeling fluttering when the PVCs occur, reassured her this is a normal experience. I do think consult with cardiology is appropriate at this time  as they are happening frequently and seem to bother her. Advised limiting stress and caffeine.   I also reviewed her recent reports from the ED and with Dr. Russella Dar, as well as recent labs.  I plan to update CBC and CMP today to make sure no changes in the last month.   She had talked with Dr. Russella Dar about possibly doing a CT of abdomen, she agrees to have this ordered today as her EGD is not until the end of next month.   Also talked about repeat brain MRI due to her hx of Cerebral arterial aneurysm. She wants to wait on this at this time.   Strict ER precautions discussed with patient. She is agreeable and understanding of plan. Shared-decision making completed today.    Return following labs.  This note was prepared with assistance of Conservation officer, historic buildings. Occasional wrong-word or sound-a-like substitutions may have occurred due to the inherent limitations of voice recognition software.  In addition to time spent for ekg, I spent 45 minutes of total time on the date of the encounter performing the following actions: chart review prior to seeing  the patient, obtaining history, performing a medically necessary exam, counseling on the treatment plan, placing orders, and documenting in our EHR.      Xara Paulding M Prerana Strayer, PA-C

## 2022-12-13 ENCOUNTER — Ambulatory Visit (HOSPITAL_COMMUNITY): Payer: Medicare Other

## 2022-12-13 NOTE — Telephone Encounter (Signed)
Please see patient response

## 2022-12-16 ENCOUNTER — Other Ambulatory Visit: Payer: Self-pay | Admitting: Physician Assistant

## 2022-12-16 DIAGNOSIS — R531 Weakness: Secondary | ICD-10-CM

## 2022-12-16 DIAGNOSIS — I499 Cardiac arrhythmia, unspecified: Secondary | ICD-10-CM

## 2022-12-16 DIAGNOSIS — R42 Dizziness and giddiness: Secondary | ICD-10-CM

## 2022-12-16 NOTE — Telephone Encounter (Signed)
Please see pt msg regarding higher readings and advise

## 2022-12-21 NOTE — Telephone Encounter (Signed)
Please see pt msg as FYI and advise any recommendations

## 2022-12-27 ENCOUNTER — Emergency Department (HOSPITAL_BASED_OUTPATIENT_CLINIC_OR_DEPARTMENT_OTHER): Payer: Medicare Other | Admitting: Radiology

## 2022-12-27 ENCOUNTER — Telehealth: Payer: Self-pay

## 2022-12-27 ENCOUNTER — Other Ambulatory Visit: Payer: Self-pay

## 2022-12-27 ENCOUNTER — Other Ambulatory Visit (INDEPENDENT_AMBULATORY_CARE_PROVIDER_SITE_OTHER): Payer: Medicare Other

## 2022-12-27 ENCOUNTER — Emergency Department (HOSPITAL_BASED_OUTPATIENT_CLINIC_OR_DEPARTMENT_OTHER)
Admission: EM | Admit: 2022-12-27 | Discharge: 2022-12-27 | Disposition: A | Discharge status: Home / Self Care | Payer: Medicare Other | Attending: Emergency Medicine | Admitting: Emergency Medicine

## 2022-12-27 DIAGNOSIS — R079 Chest pain, unspecified: Secondary | ICD-10-CM | POA: Diagnosis present

## 2022-12-27 DIAGNOSIS — I493 Ventricular premature depolarization: Secondary | ICD-10-CM | POA: Diagnosis not present

## 2022-12-27 DIAGNOSIS — R42 Dizziness and giddiness: Secondary | ICD-10-CM

## 2022-12-27 DIAGNOSIS — R0789 Other chest pain: Secondary | ICD-10-CM | POA: Diagnosis not present

## 2022-12-27 DIAGNOSIS — R002 Palpitations: Secondary | ICD-10-CM | POA: Diagnosis not present

## 2022-12-27 DIAGNOSIS — Z7982 Long term (current) use of aspirin: Secondary | ICD-10-CM | POA: Diagnosis not present

## 2022-12-27 DIAGNOSIS — I499 Cardiac arrhythmia, unspecified: Secondary | ICD-10-CM

## 2022-12-27 DIAGNOSIS — K449 Diaphragmatic hernia without obstruction or gangrene: Secondary | ICD-10-CM | POA: Diagnosis not present

## 2022-12-27 DIAGNOSIS — I771 Stricture of artery: Secondary | ICD-10-CM | POA: Diagnosis not present

## 2022-12-27 DIAGNOSIS — R531 Weakness: Secondary | ICD-10-CM

## 2022-12-27 DIAGNOSIS — I700 Atherosclerosis of aorta: Secondary | ICD-10-CM | POA: Diagnosis not present

## 2022-12-27 LAB — BASIC METABOLIC PANEL
Anion gap: 10 (ref 5–15)
BUN: 15 mg/dL (ref 8–23)
CO2: 28 mmol/L (ref 22–32)
Calcium: 10.7 mg/dL — ABNORMAL HIGH (ref 8.9–10.3)
Chloride: 101 mmol/L (ref 98–111)
Creatinine, Ser: 0.66 mg/dL (ref 0.44–1.00)
GFR, Estimated: >60 mL/min (ref >60)
Glucose, Bld: 107 mg/dL — ABNORMAL HIGH (ref 70–99)
Potassium: 3.8 mmol/L (ref 3.5–5.1)
Sodium: 139 mmol/L (ref 135–145)

## 2022-12-27 LAB — CBC
HCT: 42.7 % (ref 36.0–46.0)
Hemoglobin: 13.8 g/dL (ref 12.0–15.0)
MCH: 28 pg (ref 26.0–34.0)
MCHC: 32.3 g/dL (ref 30.0–36.0)
MCV: 86.8 fL (ref 80.0–100.0)
Platelets: 317 10*3/uL (ref 150–400)
RBC: 4.92 MIL/uL (ref 3.87–5.11)
RDW: 13.6 % (ref 11.5–15.5)
WBC: 6.1 10*3/uL (ref 4.0–10.5)
nRBC: 0 % (ref 0.0–0.2)

## 2022-12-27 LAB — TROPONIN I (HIGH SENSITIVITY)
Troponin I (High Sensitivity): 5 ng/L (ref <18)
Troponin I (High Sensitivity): 5 ng/L (ref <18)

## 2022-12-27 NOTE — Progress Notes (Unsigned)
Enrolled for [employer] to mail a ZIO AT Live Telemetry monitor to patients address on file.   Dr. Angelena Form to read.

## 2022-12-27 NOTE — Telephone Encounter (Signed)
Pt currently in ER

## 2022-12-27 NOTE — Discharge Instructions (Addendum)
1.  Your Zio patch should be coming in the mail.  I contacted the cardiology group and they will be making sure that you get it. 2.  Try to rest, hydrate and be very careful with position changes with dizziness. 3.  Return if you develop chest pain, shortness of breath, feeling like he will pass out or other concerning changes.

## 2022-12-27 NOTE — Telephone Encounter (Signed)
Please be advised pt in the ER

## 2022-12-27 NOTE — Telephone Encounter (Signed)
FYI: This call has been transferred to triage nurse: Access Nurse. Once the result note has been entered staff can address the message at that time.  Patient called in with the following symptoms:  Red Word:dizziness , irregular heart rate, & weakness   Please advise at Mobile [phone] (mobile)  Message is routed to Provider Pool.

## 2022-12-27 NOTE — Telephone Encounter (Signed)
Heart monitor patch not received; pt just not feeling well, still feeling weakness heart pounding dizziness, feel like she needs to come in and see Alyssa. Has appt coming with Cardiologist Dec 3rd but feels like something is wrong. Transferred back to [place] in front office to triage

## 2022-12-27 NOTE — ED Notes (Signed)
ED Provider at bedside. 

## 2022-12-27 NOTE — Telephone Encounter (Signed)
Noted and agreed, thank you. 

## 2022-12-27 NOTE — ED Provider Notes (Signed)
[place] EMERGENCY DEPARTMENT AT [facility] Provider Note   CSN: [id number] Arrival date & time: 12/27/22  1129     History  Chief Complaint  Patient presents with   Chest Pain    Lori [name] is a 82 y.o. female.  HPI Patient reports she is having trouble with her heart.  She reports that she had been experiencing a lot of sensation of a throbbing or fluttering sensation in her lower chest and central abdomen.  She reports it is worse at certain times.  She denies it is painful.  She reports she deals with a lot of dizziness and lightheadedness.  She is not sure if it has to do with her heart or the fact that she also has vertigo.  She reports lately she is just had a lot of the symptoms of either feeling very dizzy or lightheaded with standing as well as the fluttering sensation in her lower chest.  No syncopal episode.  Patient reports that she is also been extremely fatigued which is not typical for her.  She reports that a Zio patch was ordered several weeks ago but she has never been contacted about getting it.  She reports she has follow-up with Dr. Clifton James but her appointment is not until the first week of December.    Home Medications Prior to Admission medications   Medication Sig Start Date End Date Taking? Authorizing Provider  alum & mag hydroxide-simeth (MAALOX MAX) 400-400-40 MG/5ML suspension Take 15 mLs by mouth every 6 (six) hours as needed for indigestion. 11/04/22   Dartha Lodge, PA-C  amLODipine (NORVASC) 5 MG tablet TAKE 1 TABLET BY MOUTH ONCE DAILY 10/20/22   Shelva Majestic, MD  aspirin 81 MG tablet Take 81 mg by mouth daily. Evening    [provider]  atorvastatin (LIPITOR) 20 MG tablet Take 1 tablet (20 mg total) by mouth once a week. 10/27/22   Shelva Majestic, MD  cholecalciferol (VITAMIN D3) 25 MCG (1000 UNIT) tablet Take 1,000 Units by mouth daily. Take 1 tablet daily    [provider]  cyanocobalamin 1000 MCG tablet  Take 1,000 mcg by mouth once a week.    [provider]  iron polysaccharides (NU-IRON) 150 MG capsule Take 1 capsule (150 mg total) by mouth daily. 11/09/22 01/08/23  Meryl Dare, MD  letrozole [facility]) 2.5 MG tablet TAKE 1 TABLET BY MOUTH EVERY DAY 08/11/22   Serena Croissant, MD  lidocaine (XYLOCAINE) 2 % solution Use as directed 15 mLs in the mouth or throat every 6 (six) hours as needed for mouth pain. 11/04/22   Dartha Lodge, PA-C  loratadine (CLARITIN) 10 MG tablet Take 10 mg by mouth daily. Take 1/2 tablet daily    [provider]  montelukast (SINGULAIR) 10 MG tablet TAKE 1 TABLET BY MOUTH AT BEDTIME 11/30/22   Shelva Majestic, MD  ondansetron (ZOFRAN) 4 MG tablet TAKE 1 TABLET BY MOUTH EVERY 6 HOURS 12/01/22   Shelva Majestic, MD  pantoprazole (PROTONIX) 40 MG tablet TAKE 1 TABLET BY MOUTH EVERY DAY 11/10/22   Shelva Majestic, MD      Allergies    Azithromycin, Bacitracin-polymyxin b, Hydrocodone-acetaminophen, Klonopin [clonazepam], Neomycin, Nitrofurantoin, Penicillins, Pneumococcal vaccine polyvalent, Pneumovax [pneumococcal polysaccharide vaccine], and Triamcinolone    Review of Systems   Review of Systems  Physical Exam Updated Vital Signs BP (!) 149/67   Pulse 64   Temp 97.7 F (36.5 C) (Oral)   Resp  19   SpO2 94%  Physical Exam  ED Results / Procedures / Treatments   Labs (all labs ordered are listed, but only abnormal results are displayed) Labs Reviewed  BASIC METABOLIC PANEL - Abnormal; Notable for the following components:      Result Value   Glucose, Bld 107 (*)    Calcium 10.7 (*)    All other components within normal limits  CBC  TROPONIN I (HIGH SENSITIVITY)  TROPONIN I (HIGH SENSITIVITY)    EKG EKG Interpretation Date/Time:  Monday December 27 2022 12:01:33 EDT Ventricular Rate:  82 PR Interval:  185 QRS Duration:  83 QT Interval:  355 QTC Calculation: 415 R Axis:   63  Text Interpretation: Sinus rhythm Multiform  ventricular premature complexes Biatrial enlargement agree. no ischemic appearance Confirmed by Arby Barrette [phone]) on 12/27/2022 1:15:29 PM  Radiology DG Chest Port 1 View  Result Date: 12/27/2022 CLINICAL DATA:  Chest discomfort.  History of hiatal hernia. EXAM: PORTABLE CHEST 1 VIEW COMPARISON:  11/04/2022 FINDINGS: Artifact overlies the chest. Heart size is normal. There is atherosclerosis and tortuosity of the aorta. The lungs show some mild chronic markings but no sign of active infiltrate, collapse or effusion. No visible hiatal hernia bifrontal radiography. No acute bone finding. IMPRESSION: No active disease. Atherosclerotic and tortuous aorta. Electronically Signed   By: Paulina Fusi M.D.   On: 12/27/2022 13:51    Procedures Procedures    Medications Ordered in ED Medications - No data to display  ED Course/ Medical Decision Making/ A&P                                 Medical Decision Making Amount and/or Complexity of Data Reviewed Labs: ordered. Radiology: ordered.  Patient presents because she has concerns for her heart rhythm. Patient was seen at [facility] primary care by [facility] [place] [phone].  Occasional PVCs were noted.  Patient was referred to cardiology and also has follow-up with Dr. Russella Dar for an EGD.  CT abdomen was also ordered at that time.  Today we will check basic lab work and troponin and EKG and CXR.  EKG no significant change from previous.  I have observed the monitor multiple occasions.  Patient consistently has been sinus rhythm rates of about 70s with occasional PVC.  Troponin 5 basic metabolic panel normal CBC normal  On examination patient is clinically well.  The symptoms have been present now for at least a number of months.  It appears that she gets PVCs but have not had any documented dysrhythmia to this point.  She does not experience chest pain.  Clinically the patient is well in appearance.  diffferential diagnosis includes frequent  PVCs\atrial fib or flutter\SVT.  Consult: Reviewed with Dr. Rennis Golden.  At this time he will facilitate patient getting her Zio patch and agrees with a trial of Toprol for PVCs.  Discussed with the patient however she then advised that she absolutely could not take any beta-blockers.  She reports that she had taken them once and she had severe bradycardia and was instructed never to take them again.  At this time plan will be for patient to continue home care with her regularly prescribed medications.  She should be getting the Zio patch to help differentiate episodes of palpitations.  Patient is clinically well in appearance and symptoms have been longstanding.  At this time she does not seem to have any decompensated symptoms.  Dizziness has been a longstanding phenomenon and currently with stable blood pressures and normal neurologic exam I do not feel that further diagnostic evaluation is indicated today.         Final Clinical Impression(s) / ED Diagnoses Final diagnoses:  Palpitation  PVC (premature ventricular contraction)  Dizziness    Rx / DC Orders ED Discharge Orders     None         Arby Barrette, MD 12/27/22 1611

## 2022-12-27 NOTE — Telephone Encounter (Signed)
Advised to go to ED   Patient Name First: Lori Last: [name] Gender: Female DOB: 1940/06/01 Age: 82 Y 9 M 17 D Return Phone Number: [phone] (Primary) Address: City/ State/ Zip: [place]  [address] Client [facility] - Administrator, [facility] at [facility] Provider Tana Conch- MD Contact Type Call Who Is Calling Patient / Member / Family / Caregiver Call Type Triage / Clinical Relationship To Patient Self Return Phone Number [phone] (Primary) Chief Complaint Heart palpitations or irregular heartbeat Reason for Call Symptomatic / Request for Health Information Initial Comment Caller states she has dizziness , irregular heartbeat and general weakness. Translation No Nurse Assessment Nurse: Scarlette Ar, RN, Heather Date/Time (Eastern Time): 12/27/2022 10:40:39 AM Confirm and document reason for call. If symptomatic, describe symptoms. ---Caller states that she is having dizziness everyday and she feels that her heartbeat is currently irregular. Does the patient have any new or worsening symptoms? ---Yes Will a triage be completed? ---Yes Related visit to physician within the last 2 weeks? ---No Does the PT have any chronic conditions? (i.e. diabetes, asthma, this includes High risk factors for pregnancy, etc.) ---Yes List chronic conditions. ---arrythmia Is this a behavioral health or substance abuse call? ---No Guidelines Guideline Title Affirmed Question Affirmed Notes Nurse Date/Time (Eastern Time) Heart Rate and Heartbeat Questions Feeling weak or lightheaded (e.g., woozy, feeling like they might faint) Standifer, RN, Heather 12/27/2022 10:43:01 AM Disp. Time Lamount Cohen Time) Disposition Final User 12/27/2022 10:48:14 AM Go to ED Now Yes Scarlette Ar, RN, Herbert Seta Final Disposition 12/27/2022 10:48:14 AM Go to ED Now Yes Standifer, RN, Sibyl Parr Disagree/Comply Comply Caller Understands  Yes PreDisposition Call Doctor Care Advice Given Per Guideline GO TO ED NOW: * You need to be seen in the Emergency Department. NOTE TO TRIAGER - DRIVING: * Another adult should drive. CARE ADVICE given per Heart Rate and Heartbeat Questions (Adult) guideline. Referrals [facility] - ED

## 2022-12-27 NOTE — ED Triage Notes (Addendum)
Pt reports dizziness and epigatric/substernal pain for weeks, worse today.  Pt reports h/o irregular heartbeat and states cardiology has ordered a monitor for her to wear at home but pt has not yet received it.  Pt called cardiology this am and advised to come to ED for eval.  Pt very talkative, in NAD in triage.  Repeatedly references h/o acid reflux in addition to irregular heatbeat.

## 2023-01-03 DIAGNOSIS — I499 Cardiac arrhythmia, unspecified: Secondary | ICD-10-CM

## 2023-01-03 DIAGNOSIS — R531 Weakness: Secondary | ICD-10-CM

## 2023-01-03 DIAGNOSIS — R42 Dizziness and giddiness: Secondary | ICD-10-CM | POA: Diagnosis not present

## 2023-01-04 DIAGNOSIS — R42 Dizziness and giddiness: Secondary | ICD-10-CM | POA: Diagnosis not present

## 2023-01-04 DIAGNOSIS — I499 Cardiac arrhythmia, unspecified: Secondary | ICD-10-CM | POA: Diagnosis not present

## 2023-01-20 ENCOUNTER — Encounter: Payer: Medicare Other | Admitting: Gastroenterology

## 2023-01-24 ENCOUNTER — Encounter: Payer: Self-pay | Admitting: Family Medicine

## 2023-01-24 ENCOUNTER — Ambulatory Visit: Payer: Medicare Other | Admitting: Cardiology

## 2023-01-28 ENCOUNTER — Telehealth: Payer: Self-pay

## 2023-01-28 NOTE — Telephone Encounter (Signed)
Transition Care Management Unsuccessful Follow-up Telephone Call  Date of discharge and from where:  [facility] 10/7  Attempts:  1st Attempt  Reason for unsuccessful TCM follow-up call:  No answer/busy   Lenard Forth [place]  [facility], [facility] Guide, Phone: [phone] Website: [email or web address]

## 2023-01-28 NOTE — Telephone Encounter (Signed)
Transition Care Management Unsuccessful Follow-up Telephone Call  Date of discharge and from where:  [facility] 10/4  Attempts:  2nd Attempt  Reason for unsuccessful TCM follow-up call:  No answer/busy   [facility]  [facility], [facility] Guide, Phone: [phone] Website: [email or web address]

## 2023-01-31 ENCOUNTER — Ambulatory Visit: Payer: Medicare Other | Attending: Cardiovascular Disease | Admitting: Cardiovascular Disease

## 2023-01-31 ENCOUNTER — Encounter: Payer: Self-pay | Admitting: Cardiovascular Disease

## 2023-01-31 VITALS — BP 122/64 | HR 90 | Ht 64.0 in | Wt 114.2 lb

## 2023-01-31 DIAGNOSIS — R002 Palpitations: Secondary | ICD-10-CM | POA: Diagnosis not present

## 2023-01-31 DIAGNOSIS — R072 Precordial pain: Secondary | ICD-10-CM | POA: Diagnosis not present

## 2023-01-31 DIAGNOSIS — I493 Ventricular premature depolarization: Secondary | ICD-10-CM

## 2023-01-31 DIAGNOSIS — R42 Dizziness and giddiness: Secondary | ICD-10-CM | POA: Diagnosis not present

## 2023-01-31 MED ORDER — DILTIAZEM HCL ER COATED BEADS 120 MG PO CP24: 120.0000 mg | ORAL_CAPSULE | Freq: Every day | ORAL | 3 refills | Status: DC

## 2023-01-31 NOTE — Progress Notes (Signed)
Chief Complaint  Patient presents with   New Patient (Initial Visit)    Palpitations   History of Present Illness: 82 yo female with history of anxiety, aortic atherosclerosis, breast cancer, Barrett's esophagus, GERD/hiatal hernia, HTN, HLD and polymyalgia rheumatica who is here today as a new consult, referred by Dr. Durene Cal, for the evaluation of fatigue and palpitations. She was seen in the ED 12/27/22 with c/o palpitations. EKG with sinus. Cardiac monitor with PVCs. Labs within normal limits. Cardiac monitor November 2024 with sinus, one 6 beat run of VT, 8 runs of SVT with longest lasting 11.9 seconds. PVCs (15,204 during 2 week monitoring period). She tells me today that she continues to have fatigue, dizziness and "throbbing" in her chest. She has no energy. She is convinced that her heart is her problem. She has had vertigo for years. She has also had GERD for years. The throbbing in her chest seems to be associated with palpitations. She feels this mostly in her epigastric area. No exertional chest pain.   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    Eczema    Family history of breast cancer    Family history of kidney cancer    Family history of multiple myeloma    Family history of thyroid cancer    GERD (gastroesophageal reflux disease)    Headache(784.0)    History of IBS    History of kidney stones    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    Hyperlipidemia    on medication   Hypertension    Low back  pain    Meniere disease    Neuromuscular disorder (HCC)    raynaud's   Osteopenia    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HCC)    Stroke (HCC) 1988   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 carotid artery dissection    Tubular adenoma of colon 02/2013   Varicose veins with inflammation    upper and lower per pt    Vasculitis [facility])     Past Surgical History:  Procedure Laterality Date   arthroscopic knee  2009   left knee/ torn meniscus   BREAST LUMPECTOMY WITH RADIOACTIVE SEED LOCALIZATION Right 11/05/2020   Procedure: RIGHT BREAST LUMPECTOMY WITH RADIOACTIVE SEED LOCALIZATION;  Surgeon: Harriette Bouillon, MD;  Location: [facility];  Service: General;  Laterality: Right;   BUNIONECTOMY Right 1998   with other foot surgery    carotid artery disection  1988   Carotid Artery Dissection   CATARACT EXTRACTION, BILATERAL  07-18-2017,08-08-2017   CESAREAN SECTION  1988   1 time   COLONOSCOPY  2019   last 2019   DILATION AND CURETTAGE OF UTERUS  2004   EYE SURGERY     EYE SURGERY  09/25/2021   lowere lid of left eye   POLYPECTOMY  POPLITEAL SYNOVIAL CYST EXCISION     left leg   TONSILLECTOMY  1957   UPPER GASTROINTESTINAL ENDOSCOPY     last 2018    Current Outpatient Medications  Medication Sig Dispense Refill   aspirin 81 MG tablet Take 81 mg by mouth daily. Evening     atorvastatin (LIPITOR) 20 MG tablet Take 1 tablet (20 mg total) by mouth once a week. 13 tablet 3   cholecalciferol (VITAMIN D3) 25 MCG (1000 UNIT) tablet Take 1,000 Units by mouth daily. Take 1 tablet daily     cyanocobalamin 1000 MCG tablet Take 1,000 mcg by mouth once a week.     diltiazem (CARDIZEM CD) 120 MG 24 hr capsule Take 1 capsule (120 mg total) by mouth daily. 90 capsule 3   letrozole (FEMARA) 2.5 MG tablet TAKE 1 TABLET BY MOUTH EVERY DAY 90 tablet 3   loratadine (CLARITIN) 10 MG tablet Take 10 mg by mouth daily. Take 1/2 tablet daily     montelukast (SINGULAIR) 10 MG tablet TAKE 1 TABLET BY MOUTH AT  BEDTIME 90 tablet 1   ondansetron (ZOFRAN) 4 MG tablet TAKE 1 TABLET BY MOUTH EVERY 6 HOURS 12 tablet 0   pantoprazole (PROTONIX) 40 MG tablet TAKE 1 TABLET BY MOUTH EVERY DAY 90 tablet 1   No current facility-administered medications for this visit.    Allergies  Allergen Reactions   Azithromycin Other (See Comments)    Thrush    Bacitracin-Polymyxin B Itching    Burning and runny eye    Hydrocodone-Acetaminophen     VOMITING   Klonopin [Clonazepam]     -august 27th- took one dose and experienced zombie like sensation- states will never take agian   Neomycin    Nitrofurantoin     Reports tingling on tongue- no swelling   Penicillins     REACTION: Arm swelling   Pneumococcal Vaccine Polyvalent     ARM REDNESS WITH TENDERNESS   Pneumovax [Pneumococcal Polysaccharide Vaccine]    Triamcinolone Swelling    Lip,tongue and nose burning. Triamcinolone Cream 0.1%    Social History   Socioeconomic History   Marital status: Married    Spouse name: Not on file   Number of children: 4   Years of education: Not on file   Highest education level: Not on file  Occupational History   Occupation: retired  Tobacco Use   Smoking status: Never   Smokeless tobacco: Never  Vaping Use   Vaping status: Never Used  Substance and Sexual Activity   Alcohol use: No    Alcohol/week: 0.0 standard drinks of alcohol   Drug use: No   Sexual activity: Yes    Birth control/protection: Post-menopausal  Other Topics Concern   Not on file  Social History Narrative   Lives with husband who is also a patient of Dr. Durene Cal. Gery Pray [id number] 51, Berneda Rose (sees Dr. Durene Cal), Maralyn Sago 34. 3 grandkids in 2022   Social Determinants of Health   Financial Resource Strain: Low Risk  (10/14/2022)   Overall Financial Resource Strain (CARDIA)    Difficulty of Paying Living Expenses: Not hard at all  Food Insecurity: No Food Insecurity (10/14/2022)   Hunger Vital Sign    Worried About Running Out of Food in the  Last Year: Never true    Ran Out of Food in the Last Year: Never true  Transportation Needs: No Transportation Needs (10/14/2022)   PRAPARE - Administrator, Civil Service (Medical): No  Lack of Transportation (Non-Medical): No  Physical Activity: Inactive (10/14/2022)   Exercise Vital Sign    Days of Exercise per Week: 0 days    Minutes of Exercise per Session: 0 min  Stress: No Stress Concern Present (10/14/2022)   [employer] of Occupational Health - Occupational Stress Questionnaire    Feeling of Stress : Not at all  Social Connections: Socially Integrated (10/14/2022)   Social Connection and Isolation Panel [NHANES]    Frequency of Communication with Friends and Family: More than three times a week    Frequency of Social Gatherings with Friends and Family: More than three times a week    Attends Religious Services: 1 to 4 times per year    Active Member of [employer] or Organizations: Yes    Attends Banker Meetings: 1 to 4 times per year    Marital Status: Married  Catering manager Violence: Not At Risk (10/14/2022)   Humiliation, Afraid, Rape, and Kick questionnaire    Fear of Current or Ex-Partner: No    Emotionally Abused: No    Physically Abused: No    Sexually Abused: No    Family History  Problem Relation Age of Onset   Multiple myeloma Father 67   Thyroid cancer Sister 58       s/p removal. papilary and anaplastic.    Lung cancer Sister    Kidney disease Brother        cancer- removed   Kidney cancer Brother        dx early 28s   Alzheimer's disease Brother    Stroke Brother 71   Breast cancer Cousin 75       paternal first cousin   Cancer Cousin        unknown type, paternal first cousin   Breast cancer Other        mother's first cousin   Cervical cancer Other    Colon cancer Neg Hx    Esophageal cancer Neg Hx    Rectal cancer Neg Hx    Stomach cancer Neg Hx    Colon polyps Neg Hx     Review of Systems:  As stated in the HPI  and otherwise negative.   BP 122/64   Pulse 90   Ht 5\' 4"  (1.626 m)   Wt 51.8 kg   SpO2 99%   BMI 19.60 kg/m   Physical Examination: General: Well developed, well nourished, NAD  HEENT: OP clear, mucus membranes moist  SKIN: warm, dry. No rashes. Neuro: No focal deficits  Musculoskeletal: Muscle strength 5/5 all ext  Psychiatric: Mood and affect normal  Neck: No JVD, no carotid bruits, no thyromegaly, no lymphadenopathy.  Lungs:Clear bilaterally, no wheezes, rhonci, crackles Cardiovascular: Regular rate and rhythm. No murmurs, gallops or rubs. Abdomen:Soft. Bowel sounds present. Non-tender.  Extremities: No lower extremity edema. Pulses are 2 + in the bilateral DP/PT.  EKG:  EKG is not ordered today. The ekg ordered today demonstrates   Recent Labs: 05/12/2022: TSH 1.05 12/09/2022: ALT 12 12/27/2022: BUN 15; Creatinine, Ser 0.66; Hemoglobin 13.8; Platelets 317; Potassium 3.8; Sodium 139   Lipid Panel    Component Value Date/Time   CHOL 198 10/25/2022 0851   TRIG 52.0 10/25/2022 0851   HDL 86.70 10/25/2022 0851   CHOLHDL 2 10/25/2022 0851   VLDL 10.4 10/25/2022 0851   LDLCALC 101 (H) 10/25/2022 0851   LDLCALC 63 12/27/2019 1141   LDLDIRECT 74.0 02/06/2016 0811     Wt Readings from Last 3  Encounters:  01/31/23 51.8 kg  12/09/22 50.8 kg  11/09/22 49.4 kg    Assessment and Plan:   1. Palpitations/fatigue/dizziness/chest pain: Recent cardiac monitor with several short runs of SVT but 1% burden of PVCs. I suspect that her PVCs are causing her symptoms. She does not wish to try a beta blocker as this caused her to be hypotensive in the past.  I will arrange an echo to assess LV systolic function and exclude structural heart disease.  Will stop Norvasc and start Cardizem CD 120 mg daily. Hopefully this will suppress her PVCs.  Will also arrange a Lexiscan nuclear stress test to exclude ischemia.    Labs/ tests ordered today include:   Orders Placed This Encounter   Procedures   MYOCARDIAL PERFUSION IMAGING   EKG 12-Lead   ECHOCARDIOGRAM COMPLETE   Disposition:   F/U with me in 4-8 weeks.    Signed, Verne Carrow, MD, [facility] 01/31/2023 2:36 PM    [facility] [address] [place], [place], [place]  [address] Phone: [phone]; Fax: [phone]

## 2023-01-31 NOTE — Patient Instructions (Signed)
Medication Instructions:  Your physician has recommended you make the following change in your medication:  1.) stop Norvasc (amlodipine) 2.) start Cardizem CD (diltiazem) 120 mg - one tablet daily  *If you need a refill on your cardiac medications before your next appointment, please call your pharmacy*   Lab Work: none   Testing/Procedures: Your physician has requested that you have an echocardiogram. Echocardiography is a painless test that uses sound waves to create images of your heart. It provides your doctor with information about the size and shape of your heart and how well your heart's chambers and valves are working. This procedure takes approximately one hour. There are no restrictions for this procedure. Please do NOT wear cologne, perfume, aftershave, or lotions (deodorant is allowed). Please arrive 15 minutes prior to your appointment time.  Please note: We ask at that you not bring children with you during ultrasound (echo/ vascular) testing. Due to room size and safety concerns, children are not allowed in the ultrasound rooms during exams. Our front office staff cannot provide observation of children in our lobby area while testing is being conducted. An adult accompanying a patient to their appointment will only be allowed in the ultrasound room at the discretion of the ultrasound technician under special circumstances. We apologize for any inconvenience.  Your physician has requested that you have a lexiscan myoview. For further information please visit [email or web address] Please follow instruction sheet, as given.   Follow-Up: March 10, 2024 10:40 am with Dr. Clifton James

## 2023-02-01 ENCOUNTER — Ambulatory Visit (INDEPENDENT_AMBULATORY_CARE_PROVIDER_SITE_OTHER): Payer: Medicare Other | Admitting: Physician Assistant

## 2023-02-01 ENCOUNTER — Encounter: Payer: Self-pay | Admitting: Family Medicine

## 2023-02-01 VITALS — BP 113/67 | HR 80 | Temp 98.0°F | Ht 64.0 in | Wt 114.2 lb

## 2023-02-01 DIAGNOSIS — I671 Cerebral aneurysm, nonruptured: Secondary | ICD-10-CM

## 2023-02-01 DIAGNOSIS — I10 Essential (primary) hypertension: Secondary | ICD-10-CM

## 2023-02-01 DIAGNOSIS — R42 Dizziness and giddiness: Secondary | ICD-10-CM | POA: Diagnosis not present

## 2023-02-01 DIAGNOSIS — R002 Palpitations: Secondary | ICD-10-CM

## 2023-02-01 DIAGNOSIS — I493 Ventricular premature depolarization: Secondary | ICD-10-CM | POA: Diagnosis not present

## 2023-02-01 NOTE — Progress Notes (Signed)
Patient ID: Lori [name], female    DOB: [birth year], 82 y.o.   MRN: [id number]   Assessment & Plan:  Dizziness  Palpitations  PVC's (premature ventricular contractions)  Primary hypertension  Cerebral arterial aneurysm    Assessment and Plan    Premature Ventricular Contractions (PVCs) Frequent PVCs causing discomfort and breathlessness. Recently completed 14-day Zio patch monitoring. Cardiology consultation completed with plans for further testing. -Follow plan with cardiology, reviewed note today  -Undergo scheduled echocardiogram and stress test as planned by cardiology.  Hypertension Recent change in antihypertensive medication from Amlodipine to Cardizem. -Start new antihypertensive medication as directed by cardiology. -Monitor blood pressure regularly.  Cerebral Aneurysm Small aneurysm identified outside the covering of the brain. Last MRA in 2022. Plan to discuss with PCP if needing to reevaluate due to her dizziness. Patient mentions that specialist advice did not think further monitoring necessary.        ER precautions discussed, pt agreeable.    Return if symptoms worsen or fail to improve.    Subjective:    Chief Complaint  Patient presents with   Dizziness    Follow up for dizziness and palpitations    Dizziness   Discussed the use of AI scribe software for clinical note transcription with the patient, who gave verbal consent to proceed.  History of Present Illness   The patient, with a history of premature ventricular contractions (PVCs), presents with ongoing discomfort and concern due to PVCs. The patient reports experiencing dizziness and lightheadedness, which occur almost daily and are sometimes severe enough to require rest. The patient notes that these symptoms have been ongoing.  The patient recently had a cardiology consultation where changes in medication were recommended. Amlodipine was discontinued and instead changed to  Cardizem. She hasn't started this medication yet.   We also discussed history of pseudo-aneurysm of carotid arteries. The patient reports that these conditions have been monitored in the past, but no recent tests have been conducted. The patient expresses concern about these conditions but has been advised by a previous healthcare provider that no intervention is necessary at this time.       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    Eczema    Family history of breast cancer    Family history of kidney cancer    Family history of multiple myeloma    Family history of thyroid cancer    GERD (gastroesophageal reflux disease)    Headache(784.0)    History of IBS    History of kidney stones    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    Hyperlipidemia    on medication   Hypertension    Low back pain    Meniere disease    Neuromuscular disorder (HCC)    raynaud's   Osteopenia    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HCC)  Stroke [facility]) 1988   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 carotid artery dissection    Tubular adenoma of colon 02/2013   Varicose veins with inflammation    upper and lower per pt    Vasculitis [facility])     Past Surgical History:  Procedure Laterality Date   arthroscopic knee  2009   left knee/ torn meniscus   BREAST LUMPECTOMY WITH RADIOACTIVE SEED LOCALIZATION Right 11/05/2020   Procedure: RIGHT BREAST LUMPECTOMY WITH RADIOACTIVE SEED LOCALIZATION;  Surgeon: Harriette Bouillon, MD;  Location: [facility];  Service: General;  Laterality: Right;    BUNIONECTOMY Right 1998   with other foot surgery    carotid artery disection  1988   Carotid Artery Dissection   CATARACT EXTRACTION, BILATERAL  07-18-2017,08-08-2017   CESAREAN SECTION  1988   1 time   COLONOSCOPY  2019   last 2019   DILATION AND CURETTAGE OF UTERUS  2004   EYE SURGERY     EYE SURGERY  09/25/2021   lowere lid of left eye   POLYPECTOMY     POPLITEAL SYNOVIAL CYST EXCISION     left leg   TONSILLECTOMY  1957   UPPER GASTROINTESTINAL ENDOSCOPY     last 2018    Family History  Problem Relation Age of Onset   Multiple myeloma Father 69   Thyroid cancer Sister 79       s/p removal. papilary and anaplastic.    Lung cancer Sister    Kidney disease Brother        cancer- removed   Kidney cancer Brother        dx early 44s   Alzheimer's disease Brother    Stroke Brother 25   Breast cancer Cousin 48       paternal first cousin   Cancer Cousin        unknown type, paternal first cousin   Breast cancer Other        mother's first cousin   Cervical cancer Other    Colon cancer Neg Hx    Esophageal cancer Neg Hx    Rectal cancer Neg Hx    Stomach cancer Neg Hx    Colon polyps Neg Hx     Social History   Tobacco Use   Smoking status: Never   Smokeless tobacco: Never  Vaping Use   Vaping status: Never Used  Substance Use Topics   Alcohol use: No    Alcohol/week: 0.0 standard drinks of alcohol   Drug use: No     Allergies  Allergen Reactions   Azithromycin Other (See Comments)    Thrush    Bacitracin-Polymyxin B Itching    Burning and runny eye    Hydrocodone-Acetaminophen     VOMITING   Klonopin [Clonazepam]     -august 27th- took one dose and experienced zombie like sensation- states will never take agian   Neomycin    Nitrofurantoin     Reports tingling on tongue- no swelling   Penicillins     REACTION: Arm swelling   Pneumococcal Vaccine Polyvalent     ARM REDNESS WITH TENDERNESS   Pneumovax [Pneumococcal Polysaccharide Vaccine]     Triamcinolone Swelling    Lip,tongue and nose burning. Triamcinolone Cream 0.1%    Review of Systems  Neurological:  Positive for dizziness.   NEGATIVE UNLESS OTHERWISE INDICATED IN HPI      Objective:     BP 113/67 (BP Location: Left Arm, Patient Position: Sitting, Cuff Size: Normal)   Pulse  80   Temp 98 F (36.7 C) (Temporal)   Ht 5\' 4"  (1.626 m)   Wt 114 lb 4 oz (51.8 kg)   SpO2 97%   BMI 19.61 kg/m   Wt Readings from Last 3 Encounters:  02/01/23 114 lb 4 oz (51.8 kg)  01/31/23 114 lb 3.2 oz (51.8 kg)  12/09/22 112 lb (50.8 kg)    BP Readings from Last 3 Encounters:  02/01/23 113/67  01/31/23 122/64  12/27/22 (!) 149/67     Physical Exam Vitals and nursing note reviewed.  Constitutional:      Appearance: Normal appearance. She is normal weight. She is not toxic-appearing.     Comments: Using a cane  HENT:     Head: Normocephalic and atraumatic.     Right Ear: Tympanic membrane, ear canal and external ear normal.     Left Ear: Tympanic membrane, ear canal and external ear normal.     Nose: Nose normal.     Mouth/Throat:     Mouth: Mucous membranes are moist.  Eyes:     Extraocular Movements: Extraocular movements intact.     Conjunctiva/sclera: Conjunctivae normal.     Pupils: Pupils are equal, round, and reactive to light.  Cardiovascular:     Rate and Rhythm: Normal rate. Rhythm irregular.     Pulses: Normal pulses.     Heart sounds: Normal heart sounds. No murmur heard.    Comments: No carotid bruits  Pulmonary:     Effort: Pulmonary effort is normal.     Breath sounds: Normal breath sounds.  Abdominal:     Tenderness: There is no abdominal tenderness.  Musculoskeletal:        General: Normal range of motion.     Cervical back: Normal range of motion and neck supple.     Right lower leg: No edema.     Left lower leg: No edema.  Skin:    General: Skin is warm and dry.  Neurological:     General: No focal deficit present.     Mental Status:  She is alert and oriented to person, place, and time.     Cranial Nerves: No cranial nerve deficit.     Sensory: No sensory deficit.     Motor: No weakness.     Coordination: Coordination normal.     Gait: Gait normal.  Psychiatric:        Mood and Affect: Mood is anxious (baseline).        Behavior: Behavior normal.        Thought Content: Thought content normal.        Judgment: Judgment normal.           Time Spent: 33 minutes of total time was spent on the date of the encounter performing the following actions: chart review prior to seeing the patient, obtaining history, performing a medically necessary exam, counseling on the treatment plan, placing orders, and documenting in our EHR.       Mikela Senn M Trenesha Alcaide, PA-C

## 2023-02-01 NOTE — Patient Instructions (Addendum)
VISIT SUMMARY:  During today's visit, we discussed your ongoing symptoms related to premature ventricular contractions (PVCs), including dizziness and lightheadedness. Additionally, we addressed your recent changes in your hypertension medication.  YOUR PLAN:  -PREMATURE VENTRICULAR CONTRACTIONS (PVCS): PVCs are extra heartbeats that begin in one of the heart's two lower pumping chambers. You are experiencing frequent PVCs that cause discomfort and breathlessness. Please continue with your current management plan and undergo the scheduled echocardiogram and stress test as planned by your cardiologist.  -HYPERTENSION: Hypertension, or high blood pressure, requires careful management to prevent complications. You have recently changed from Amlodipine to Cardizem as directed by your cardiologist. Please start the new medication as prescribed and monitor your blood pressure regularly.  -GENERAL HEALTH MAINTENANCE: Continue with your current lifestyle modifications and symptom management strategies for PVCs, including relaxation and breathing exercises. Follow up with Dr. Durene Cal in January.

## 2023-02-09 ENCOUNTER — Telehealth (HOSPITAL_COMMUNITY): Payer: Self-pay | Admitting: *Deleted

## 2023-02-09 NOTE — Telephone Encounter (Signed)
Left message on voicemail per DPR in reference to upcoming appointment scheduled on 02/14/23 at 10:30 with detailed instructions given per Myocardial Perfusion Study Information Sheet for the test. LM to arrive 15 minutes early, and that it is imperative to arrive on time for appointment to keep from having the test rescheduled. If you need to cancel or reschedule your appointment, please call the office within 24 hours of your appointment. Failure to do so may result in a cancellation of your appointment, and a $50 no show fee. Phone number given for call back for any questions.

## 2023-02-10 DIAGNOSIS — H16212 Exposure keratoconjunctivitis, left eye: Secondary | ICD-10-CM | POA: Diagnosis not present

## 2023-02-10 DIAGNOSIS — H02055 Trichiasis without entropian left lower eyelid: Secondary | ICD-10-CM | POA: Diagnosis not present

## 2023-02-14 ENCOUNTER — Ambulatory Visit (HOSPITAL_COMMUNITY): Payer: Medicare Other | Attending: Cardiovascular Disease

## 2023-02-14 DIAGNOSIS — R42 Dizziness and giddiness: Secondary | ICD-10-CM | POA: Diagnosis not present

## 2023-02-14 DIAGNOSIS — R002 Palpitations: Secondary | ICD-10-CM | POA: Insufficient documentation

## 2023-02-14 DIAGNOSIS — R072 Precordial pain: Secondary | ICD-10-CM | POA: Insufficient documentation

## 2023-02-14 DIAGNOSIS — I493 Ventricular premature depolarization: Secondary | ICD-10-CM | POA: Insufficient documentation

## 2023-02-14 LAB — MYOCARDIAL PERFUSION IMAGING
LV dias vol: 76 mL (ref 46–106)
LV sys vol: 24 mL
Nuc Stress EF: 69 %
Peak HR: 91 {beats}/min
Rest HR: 65 {beats}/min
Rest Nuclear Isotope Dose: 10.8 mCi
SDS: 2
SRS: 0
SSS: 2
ST Depression (mm): 0 mm
Stress Nuclear Isotope Dose: 32.5 mCi
TID: 0.92

## 2023-02-14 MED ORDER — TECHNETIUM TC 99M TETROFOSMIN IV KIT
32.5000 | PACK | Freq: Once | INTRAVENOUS | Status: AC | PRN
  Administered 2023-02-14: 32.5 via INTRAVENOUS

## 2023-02-14 MED ORDER — TECHNETIUM TC 99M TETROFOSMIN IV KIT
10.8000 | PACK | Freq: Once | INTRAVENOUS | Status: AC | PRN
  Administered 2023-02-14: 10.8 via INTRAVENOUS

## 2023-02-14 MED ORDER — REGADENOSON 0.4 MG/5ML IV SOLN
0.4000 mg | Freq: Once | INTRAVENOUS | Status: AC
Start: 2023-02-14 — End: 2023-02-14
  Administered 2023-02-14: 0.4 mg via INTRAVENOUS

## 2023-02-22 ENCOUNTER — Ambulatory Visit: Payer: Medicare Other | Admitting: Cardiovascular Disease

## 2023-02-28 ENCOUNTER — Encounter: Payer: Self-pay | Admitting: Cardiovascular Disease

## 2023-03-02 ENCOUNTER — Encounter: Payer: Self-pay | Admitting: Family Medicine

## 2023-03-02 ENCOUNTER — Ambulatory Visit: Payer: Medicare Other | Admitting: Family Medicine

## 2023-03-02 VITALS — BP 130/70 | HR 100 | Temp 97.9°F | Resp 18 | Ht 64.0 in | Wt 114.0 lb

## 2023-03-02 DIAGNOSIS — R21 Rash and other nonspecific skin eruption: Secondary | ICD-10-CM | POA: Diagnosis not present

## 2023-03-02 NOTE — Patient Instructions (Addendum)
It was very nice to see you today!  Happy Holidays  Need moisturizers as well.  And the cortizone 3x/day.   Ok to do procedures.  Cardizem if heart goes fast.    PLEASE NOTE:  If you had any lab tests please let us know if you have not heard back within a few days. You may see your results on MyChart before we have a chance to review them but we will give you a call once they are reviewed by [place]. If we ordered any referrals today, please let us know if you have not heard from their office within the next week.   Please try these tips to maintain a healthy lifestyle:  Eat most of your calories during the day when you are active. Eliminate processed foods including packaged sweets (pies, cakes, cookies), reduce intake of potatoes, white bread, white pasta, and white rice. Look for whole grain options, oat flour or almond flour.  Each meal should contain half fruits/vegetables, one quarter protein, and one quarter carbs (no bigger than a computer mouse).  Cut down on sweet beverages. This includes juice, soda, and sweet tea. Also watch fruit intake, though this is a healthier sweet option, it still contains natural sugar! Limit to 3 servings daily.  Drink at least 1 glass of water with each meal and aim for at least 8 glasses per day  Exercise at least 150 minutes every week.

## 2023-03-02 NOTE — Progress Notes (Signed)
Subjective:     Patient ID: Lori Lori [name], female    DOB: [birth year], 82 y.o.   MRN: [id number]  Chief Complaint  Patient presents with   Rash    Had a outbreak on right arm/armpit, spots all over body, after 01/31/23, started possible eczema or reaction to medication, stopped taking medication for now, diltiazem Tiny little red pimples, itchy   Medication Follow-up    Discuss blood pressure medication    HPI  Rash  R axilla.  And new spots on arm.  Itchy and "little pimples".  Started 01/31/23 on diltiazem.  About 1 wk after, got rash/wound L shoulder(Lori [name]-was wearing Lori [name]-see details from [facility] 02/28/23.   Had changed from amlodipine to diltiazem as was having palpitations. Eczema in 1960's and children have.  Has tried cetophil wash, vasaline, HC cream. Couldn't sleep d/t rash and now on legs, arms, etc.  Eczema w/each pregnancy but hands only. Very sensitive skin. Had poison ivy sev months ago and had rxn to triamcinolone cream HTN-was doing well on amlodipine but changed to diltiazem d/t palpitations.  On BB in past and bp and pulse too low.  Seeing Card.  Gets echo next wk.  Had perfusion study.  Sees Card 12/20.  Bp's 130-140/"good".  Less pvc's on diltiazem.  Off for 2-3 days and feeling them again but ok.  Sch for MRA angio brain 12/23.   There are no preventive care reminders to display for this patient.  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    Eczema    Family history of breast cancer    Family history of kidney cancer    Family  history of multiple myeloma    Family history of thyroid cancer    GERD (gastroesophageal reflux disease)    Headache(784.0)    History of IBS    History of kidney stones    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    Hyperlipidemia    on medication   Hypertension    Low back pain    Meniere disease    Neuromuscular disorder (HCC)    raynaud's   Osteopenia    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HCC)    Stroke (HCC) 1988   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 carotid artery dissection    Tubular adenoma of colon 02/2013   Varicose veins with inflammation    upper and lower per pt    Vasculitis [facility])     Past Surgical History:  Procedure Laterality Date   arthroscopic knee  2009   left knee/ torn meniscus   BREAST LUMPECTOMY WITH RADIOACTIVE SEED LOCALIZATION Right 11/05/2020   Procedure: RIGHT BREAST LUMPECTOMY WITH RADIOACTIVE SEED LOCALIZATION;  Surgeon: Harriette Bouillon, MD;  Location: [facility];  Service: General;  Laterality: Right;   BUNIONECTOMY Right 1998   with other foot surgery    carotid artery disection  1988   Carotid Artery Dissection   CATARACT EXTRACTION, BILATERAL  07-18-2017,08-08-2017   CESAREAN SECTION  1988   1 time   COLONOSCOPY  2019   last 2019   DILATION AND CURETTAGE OF UTERUS  2004   EYE SURGERY     EYE SURGERY  09/25/2021   lowere lid of left eye   POLYPECTOMY     POPLITEAL SYNOVIAL CYST EXCISION     left leg   TONSILLECTOMY  1957   UPPER GASTROINTESTINAL ENDOSCOPY     last 2018     Current Outpatient Medications:    aspirin 81 MG tablet, Take 81 mg by mouth daily. Evening, Disp: , Rfl:    atorvastatin (LIPITOR) 20 MG tablet, Take 1 tablet (20 mg total) by mouth once a week., Disp: 13 tablet, Rfl: 3   cholecalciferol (VITAMIN D3) 25 MCG (1000 UNIT) tablet, Take 1,000 Units by mouth daily. Take 1 tablet daily, Disp: , Rfl:    cyanocobalamin 1000 MCG tablet, Take 1,000 mcg by mouth once a week., Disp: , Rfl:    letrozole (FEMARA) 2.5 MG tablet,  TAKE 1 TABLET BY MOUTH EVERY DAY, Disp: 90 tablet, Rfl: 3   loratadine (CLARITIN) 10 MG tablet, Take 10 mg by mouth daily. Take 1/2 tablet daily, Disp: , Rfl:    montelukast (SINGULAIR) 10 MG tablet, TAKE 1 TABLET BY MOUTH AT BEDTIME, Disp: 90 tablet, Rfl: 1   ondansetron (ZOFRAN) 4 MG tablet, TAKE 1 TABLET BY MOUTH EVERY 6 HOURS, Disp: 12 tablet, Rfl: 0   pantoprazole (PROTONIX) 40 MG tablet, TAKE 1 TABLET BY MOUTH EVERY DAY, Disp: 90 tablet, Rfl: 1   diltiazem (CARDIZEM CD) 120 MG 24 hr capsule, Take 1 capsule (120 mg total) by mouth daily. (Patient not taking: Reported on 03/02/2023), Disp: 90 capsule, Rfl: 3  Allergies  Allergen Reactions   Azithromycin Other (See Comments)    Thrush   Bacitracin-Polymyxin B Itching    Burning and runny eye    Hydrocodone-Acetaminophen     VOMITING   Klonopin [Clonazepam]     -august 27th- took one dose and experienced zombie like sensation- states will never take agian   Neomycin    Nitrofurantoin     Reports tingling on tongue- no swelling   Penicillins     REACTION: Arm swelling   Pneumococcal Vaccine Polyvalent     ARM REDNESS WITH TENDERNESS   Pneumovax [Pneumococcal Polysaccharide Vaccine]    Triamcinolone Swelling    Lip,tongue and nose burning. Triamcinolone Cream 0.1%   Bacitracin Other (See Comments)   Hydrocodone Other (See Comments)   Other Other (See Comments)   Pneumococcal Vaccine Other (See Comments)   ROS neg/noncontributory except as noted HPI/below      Objective:     BP 130/70   Pulse 100   Temp 97.9 F (36.6 C) (Temporal)   Resp 18   Ht 5\' 4"  (1.626 m)   Wt 114 lb (51.7 kg)   SpO2 97%   BMI 19.57 kg/m  Wt Readings from Last 3 Encounters:  03/02/23 114 lb (51.7 kg)  02/14/23 114 lb (51.7 kg)  02/01/23 114 lb 4 oz (51.8 kg)    Physical Exam   Gen: WDWN NAD HEENT: NCAT, conjunctiva not injected, sclera nonicteric OP normal CARDIAC: RRR, S1S2+, no murmur. EXT:  tr edema MSK: no gross abnormalities.   NEURO: A&O x3.  CN II-XII intact.  PSYCH: normal mood. Good eye contact  R axilla-many pink papules-not vessicles, raised and rough.  Approx 2cm Lori [name] on L shoulder-more solid but some tiny papules around it.  Also, some papules under bra on L.  Some on arms and legs(few and scattered). Could possibly be yeast component.  Can try clotrimazole cream to axilla.  Probably more flaring eczema-moisturizers and hc cream.  Avoiding PO steroids as don't want to exacerbate bp/palpitations.  Possibly shingles, but too diffuse.  Reassured ok to do echo and MRI.  Monitor closely     Assessment & Plan:  Rash  Rash.  Doubt from diltiazem but pt holding for now and monitoring bp's and symptoms.    Return if symptoms worsen or fail to improve.  Angelena Sole, MD

## 2023-03-03 DIAGNOSIS — D485 Neoplasm of uncertain behavior of skin: Secondary | ICD-10-CM | POA: Diagnosis not present

## 2023-03-03 DIAGNOSIS — L821 Other seborrheic keratosis: Secondary | ICD-10-CM | POA: Diagnosis not present

## 2023-03-03 DIAGNOSIS — D225 Melanocytic nevi of trunk: Secondary | ICD-10-CM | POA: Diagnosis not present

## 2023-03-03 DIAGNOSIS — L280 Lichen simplex chronicus: Secondary | ICD-10-CM | POA: Diagnosis not present

## 2023-03-03 DIAGNOSIS — L570 Actinic keratosis: Secondary | ICD-10-CM | POA: Diagnosis not present

## 2023-03-03 DIAGNOSIS — L309 Dermatitis, unspecified: Secondary | ICD-10-CM | POA: Diagnosis not present

## 2023-03-03 DIAGNOSIS — L814 Other melanin hyperpigmentation: Secondary | ICD-10-CM | POA: Diagnosis not present

## 2023-03-07 ENCOUNTER — Ambulatory Visit (HOSPITAL_COMMUNITY): Payer: Medicare Other | Attending: Cardiovascular Disease

## 2023-03-07 DIAGNOSIS — R002 Palpitations: Secondary | ICD-10-CM | POA: Insufficient documentation

## 2023-03-07 DIAGNOSIS — R42 Dizziness and giddiness: Secondary | ICD-10-CM | POA: Insufficient documentation

## 2023-03-07 DIAGNOSIS — R072 Precordial pain: Secondary | ICD-10-CM | POA: Diagnosis not present

## 2023-03-07 DIAGNOSIS — I493 Ventricular premature depolarization: Secondary | ICD-10-CM | POA: Diagnosis not present

## 2023-03-07 LAB — ECHOCARDIOGRAM COMPLETE
Area-P 1/2: 2.95 cm2
MV M vel: 5.55 m/s
MV Peak grad: 123.2 mm[Hg]
S' Lateral: 2.9 cm

## 2023-03-10 NOTE — Progress Notes (Signed)
Chief Complaint  Patient presents with   Follow-up    PVCs/SVT   History of Present Illness: 82 yo female with history of anxiety, aortic atherosclerosis, breast cancer, Barrett's esophagus, GERD/hiatal hernia, HTN, HLD and polymyalgia rheumatica who is here today for follow up. I saw her in November 2024 as a new consult for the evaluation of fatigue, dizziness, "throbbing in the chest". No exertional chest pain. She was seen in the ED 12/27/22 with c/o palpitations. EKG with sinus. Cardiac monitor with PVCs. Labs within normal limits. Cardiac monitor November 2024 with sinus, one 6 beat run of VT, 8 runs of SVT with longest lasting 11.9 seconds. PVCs (15,204 during 2 week monitoring period). Stress myoview 02/14/23 with no ischemia. Echo 03/07/23 with LVEF=50-55%. Normal RV function. Mild to moderate MR. Trivial AI. I started Cardizem at her visit in November 2024.   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 She stopped her Cardizem. 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 She feels dizzy every day. She associates this with her PVCs. She denies dyspnea, lower extremity edema, orthopnea, PND, near syncope or syncope.   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    Eczema    Family history of breast cancer    Family history of kidney cancer    Family history of multiple myeloma    Family history of thyroid cancer    GERD (gastroesophageal reflux  disease)    Headache(784.0)    History of IBS    History of kidney stones    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    Hyperlipidemia    on medication   Hypertension    Low back pain    Meniere disease    Neuromuscular disorder (HCC)    raynaud's   Osteopenia    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HCC)    Stroke (HCC) 1988   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 carotid artery dissection    Tubular adenoma of colon 02/2013   Varicose veins with inflammation    upper and lower per pt    Vasculitis [facility])     Past Surgical History:  Procedure Laterality Date   arthroscopic knee  2009   left knee/ torn meniscus   BREAST LUMPECTOMY WITH RADIOACTIVE SEED LOCALIZATION Right 11/05/2020   Procedure: RIGHT BREAST LUMPECTOMY WITH RADIOACTIVE SEED LOCALIZATION;  Surgeon: Harriette Bouillon, MD;  Location: [facility];  Service: General;  Laterality: Right;   BUNIONECTOMY Right 1998   with other foot surgery    carotid artery disection  1988   Carotid Artery Dissection   CATARACT EXTRACTION, BILATERAL  07-18-2017,08-08-2017   CESAREAN SECTION  1988   1 time   COLONOSCOPY  2019   last 2019   DILATION AND  CURETTAGE OF UTERUS  2004   EYE SURGERY     EYE SURGERY  09/25/2021   lowere lid of left eye   POLYPECTOMY     POPLITEAL SYNOVIAL CYST EXCISION     left leg   TONSILLECTOMY  1957   UPPER GASTROINTESTINAL ENDOSCOPY     last 2018    Current Outpatient Medications  Medication Sig Dispense Refill   aspirin 81 MG tablet Take 81 mg by mouth daily. Evening     atorvastatin (LIPITOR) 20 MG tablet Take 1 tablet (20 mg total) by mouth once a week. 13 tablet 3   cholecalciferol (VITAMIN D3) 25 MCG (1000 UNIT) tablet Take 1,000 Units by mouth daily. Take 1 tablet daily     cyanocobalamin 1000 MCG tablet Take 1,000 mcg by mouth once a week.     letrozole (FEMARA) 2.5 MG tablet TAKE 1 TABLET BY MOUTH EVERY DAY 90 tablet 3   loratadine (CLARITIN) 10 MG tablet Take 10 mg by mouth daily. Take 1/2 tablet  daily     metoprolol succinate (TOPROL XL) 25 MG 24 hr tablet Take 1 tablet (25 mg total) by mouth daily. 90 tablet 3   montelukast (SINGULAIR) 10 MG tablet TAKE 1 TABLET BY MOUTH AT BEDTIME 90 tablet 1   ondansetron (ZOFRAN) 4 MG tablet TAKE 1 TABLET BY MOUTH EVERY 6 HOURS 12 tablet 0   pantoprazole (PROTONIX) 40 MG tablet TAKE 1 TABLET BY MOUTH EVERY DAY 90 tablet 1   No current facility-administered medications for this visit.    Allergies  Allergen Reactions   Azithromycin Other (See Comments)    Thrush   Bacitracin-Polymyxin B Itching    Burning and runny eye    Diltiazem Rash   Hydrocodone-Acetaminophen     VOMITING   Klonopin [Clonazepam]     -august 27th- took one dose and experienced zombie like sensation- states will never take agian   Neomycin    Nitrofurantoin     Reports tingling on tongue- no swelling   Penicillins     REACTION: Arm swelling   Pneumococcal Vaccine Polyvalent     ARM REDNESS WITH TENDERNESS   Pneumovax [Pneumococcal Polysaccharide Vaccine]    Triamcinolone Swelling    Lip,tongue and nose burning. Triamcinolone Cream 0.1%   Bacitracin Other (See Comments)   Hydrocodone Other (See Comments)   Other Other (See Comments)   Pneumococcal Vaccine Other (See Comments)    Social History   Socioeconomic History   Marital status: Married    Spouse name: Not on file   Number of children: 4   Years of education: Not on file   Highest education level: Not on file  Occupational History   Occupation: retired  Tobacco Use   Smoking status: Never   Smokeless tobacco: Never  Vaping Use   Vaping status: Never Used  Substance and Sexual Activity   Alcohol use: No    Alcohol/week: 0.0 standard drinks of alcohol   Drug use: No   Sexual activity: Yes    Birth control/protection: Post-menopausal  Other Topics Concern   Not on file  Social History Narrative   Lives with husband who is also a patient of Dr. Durene Cal. Gery Pray [address] 51, Berneda Rose (sees  Dr. Durene Cal), Maralyn Sago 34. 3 grandkids in 2022   Social Drivers of Health   Financial Resource Strain: Low Risk  (10/14/2022)   Overall Financial Resource Strain (CARDIA)    Difficulty of Paying Living Expenses: Not hard at all  Food  Insecurity: No Food Insecurity (10/14/2022)   Hunger Vital Sign    Worried About Running Out of Food in the Last Year: Never true    Ran Out of Food in the Last Year: Never true  Transportation Needs: No Transportation Needs (10/14/2022)   PRAPARE - Administrator, Civil Service (Medical): No    Lack of Transportation (Non-Medical): No  Physical Activity: Inactive (10/14/2022)   Exercise Vital Sign    Days of Exercise per Week: 0 days    Minutes of Exercise per Session: 0 min  Stress: No Stress Concern Present (10/14/2022)   [employer] of Occupational Health - Occupational Stress Questionnaire    Feeling of Stress : Not at all  Social Connections: Socially Integrated (10/14/2022)   Social Connection and Isolation Panel [NHANES]    Frequency of Communication with Friends and Family: More than three times a week    Frequency of Social Gatherings with Friends and Family: More than three times a week    Attends Religious Services: 1 to 4 times per year    Active Member of [employer] or Organizations: Yes    Attends Banker Meetings: 1 to 4 times per year    Marital Status: Married  Catering manager Violence: Not At Risk (10/14/2022)   Humiliation, Afraid, Rape, and Kick questionnaire    Fear of Current or Ex-Partner: No    Emotionally Abused: No    Physically Abused: No    Sexually Abused: No    Family History  Problem Relation Age of Onset   Multiple myeloma Father 17   Thyroid cancer Sister 49       s/p removal. papilary and anaplastic.    Lung cancer Sister    Kidney disease Brother        cancer- removed   Kidney cancer Brother        dx early 74s   Alzheimer's disease Brother    Stroke Brother 59   Breast cancer Cousin 39        paternal first cousin   Cancer Cousin        unknown type, paternal first cousin   Breast cancer Other        mother's first cousin   Cervical cancer Other    Colon cancer Neg Hx    Esophageal cancer Neg Hx    Rectal cancer Neg Hx    Stomach cancer Neg Hx    Colon polyps Neg Hx     Review of Systems:  As stated in the HPI and otherwise negative.   BP (!) 150/78 (BP Location: Right Arm, Patient Position: Sitting)   Pulse 95   Ht 5\' 4"  (1.626 m)   Wt 51.3 kg   SpO2 97%   BMI 19.43 kg/m   Physical Examination: General: Well developed, well nourished, NAD  HEENT: OP clear, mucus membranes moist  SKIN: warm, dry. No rashes. Neuro: No focal deficits  Musculoskeletal: Muscle strength 5/5 all ext  Psychiatric: Mood and affect normal  Neck: No JVD, no carotid bruits, no thyromegaly, no lymphadenopathy.  Lungs:Clear bilaterally, no wheezes, rhonci, crackles Cardiovascular: Regular rate and rhythm. No murmurs, gallops or rubs. Abdomen:Soft. Bowel sounds present. Non-tender.  Extremities: No lower extremity edema. Pulses are 2 + in the bilateral DP/PT.  EKG:  EKG is not ordered today. The ekg ordered today demonstrates   Recent Labs: 05/12/2022: TSH 1.05 12/09/2022: ALT 12 12/27/2022: BUN 15; Creatinine, Ser 0.66; Hemoglobin 13.8; Platelets 317; Potassium 3.8; Sodium  139   Lipid Panel    Component Value Date/Time   CHOL 198 10/25/2022 0851   TRIG 52.0 10/25/2022 0851   HDL 86.70 10/25/2022 0851   CHOLHDL 2 10/25/2022 0851   VLDL 10.4 10/25/2022 0851   LDLCALC 101 (H) 10/25/2022 0851   LDLCALC 63 12/27/2019 1141   LDLDIRECT 74.0 02/06/2016 0811     Wt Readings from Last 3 Encounters:  03/11/23 51.3 kg  03/02/23 51.7 kg  02/14/23 51.7 kg    Assessment and Plan:   1. Palpitations/fatigue/dizziness/chest pain: Recent cardiac monitor with several short runs of SVT, 1% burden of PVCs. Echo December 2024 with normal LV and RV function with mild to moderate MR. Stress  myoview November 2024 with no ischemia. I suspect that her short runs of SVT and PVCs are causing her symptoms. She reports having hypotension in the past on beta blockers and preferred not to try this. She did not tolerate the Cardizem due to rash. She is willing to try low dose Toprol today. Will start Toprol 25 mg daily. I will refer her to EP to get another opinion on her PVC management.     2. Mitral valve regurgitation: Mild to moderate by echo in December 2024. Will repeat echo in 2 years.   Labs/ tests ordered today include:   Orders Placed This Encounter  Procedures   Ambulatory referral to Cardiac Electrophysiology   Disposition:   F/U with me in 6 months  Signed, Verne Carrow, MD, [facility] 03/11/2023 12:05 PM    [facility] [address] [place], [place], [place]  [address] Phone: [phone]; Fax: [phone]

## 2023-03-11 ENCOUNTER — Encounter: Payer: Self-pay | Admitting: Cardiovascular Disease

## 2023-03-11 ENCOUNTER — Ambulatory Visit: Payer: Medicare Other | Attending: Cardiovascular Disease | Admitting: Cardiovascular Disease

## 2023-03-11 VITALS — BP 150/78 | HR 95 | Ht 64.0 in | Wt 113.2 lb

## 2023-03-11 DIAGNOSIS — I493 Ventricular premature depolarization: Secondary | ICD-10-CM | POA: Diagnosis not present

## 2023-03-11 DIAGNOSIS — R002 Palpitations: Secondary | ICD-10-CM | POA: Diagnosis not present

## 2023-03-11 DIAGNOSIS — I340 Nonrheumatic mitral (valve) insufficiency: Secondary | ICD-10-CM

## 2023-03-11 MED ORDER — METOPROLOL SUCCINATE ER 25 MG PO TB24: 25.0000 mg | ORAL_TABLET | Freq: Every day | ORAL | 3 refills | Status: DC

## 2023-03-11 NOTE — Patient Instructions (Signed)
Medication Instructions:  Your physician has recommended you make the following change in your medication:  1.) start metoprolol succinate Toprol 25 mg -- take one tablet daily  *If you need a refill on your cardiac medications before your next appointment, please call your pharmacy*   Lab Work: None today If you have labs (blood work) drawn today and your tests are completely normal, you will receive your results only by: MyChart Message (if you have MyChart) OR A paper copy in the mail If you have any lab test that is abnormal or we need to change your treatment, we will call you to review the results.   Testing/Procedures: none   Follow-Up: At [facility], you and your health needs are our priority.  As part of our continuing mission to provide you with exceptional heart care, we have created designated Provider Care Teams.  These Care Teams include your primary Cardiologist (physician) and Advanced Practice Providers (APPs -  Physician Assistants and Nurse Practitioners) who all work together to provide you with the care you need, when you need it.     Your next appointment:   12 month(s)  Provider:   Verne Carrow, MD     Other Instructions You have been referred to Cardiac Electophysiology (EP)

## 2023-03-14 ENCOUNTER — Ambulatory Visit
Admission: RE | Admit: 2023-03-14 | Discharge: 2023-03-14 | Disposition: A | Discharge status: Home / Self Care | Payer: Medicare Other | Source: Ambulatory Visit | Attending: Family Medicine | Admitting: Family Medicine

## 2023-03-14 DIAGNOSIS — I671 Cerebral aneurysm, nonruptured: Secondary | ICD-10-CM

## 2023-03-21 DIAGNOSIS — L308 Other specified dermatitis: Secondary | ICD-10-CM | POA: Diagnosis not present

## 2023-03-30 ENCOUNTER — Encounter: Payer: Self-pay | Admitting: Family Medicine

## 2023-03-30 DIAGNOSIS — H0279 Other degenerative disorders of eyelid and periocular area: Secondary | ICD-10-CM | POA: Diagnosis not present

## 2023-03-30 DIAGNOSIS — H16212 Exposure keratoconjunctivitis, left eye: Secondary | ICD-10-CM | POA: Diagnosis not present

## 2023-03-30 DIAGNOSIS — H02055 Trichiasis without entropian left lower eyelid: Secondary | ICD-10-CM | POA: Diagnosis not present

## 2023-04-07 ENCOUNTER — Encounter: Payer: Self-pay | Admitting: Cardiovascular Disease

## 2023-04-07 ENCOUNTER — Other Ambulatory Visit: Payer: Self-pay | Admitting: Cardiovascular Disease

## 2023-04-07 ENCOUNTER — Ambulatory Visit: Payer: Medicare Other | Attending: Cardiovascular Disease | Admitting: Cardiovascular Disease

## 2023-04-07 VITALS — BP 180/90 | HR 70 | Ht 64.0 in | Wt 113.0 lb

## 2023-04-07 DIAGNOSIS — R072 Precordial pain: Secondary | ICD-10-CM

## 2023-04-07 DIAGNOSIS — I499 Cardiac arrhythmia, unspecified: Secondary | ICD-10-CM

## 2023-04-07 DIAGNOSIS — I493 Ventricular premature depolarization: Secondary | ICD-10-CM

## 2023-04-07 DIAGNOSIS — R002 Palpitations: Secondary | ICD-10-CM

## 2023-04-07 MED ORDER — MAGNESIUM 400 MG PO TABS: 400.0000 mg | ORAL_TABLET | Freq: Every day | ORAL | 3 refills | Status: DC

## 2023-04-07 NOTE — Patient Instructions (Addendum)
Medication Instructions:  Magnesium TAURATE 400 mg once daily - this can be difficult to find locally, you can purchase this on amazon.com or StrictlyMakeup.fr *If you need a refill on your cardiac medications before your next appointment, please call your pharmacy*   Follow-Up: At [facility], you and your health needs are our priority.  As part of our continuing mission to provide you with exceptional heart care, we have created designated Provider Care Teams.  These Care Teams include your primary Cardiologist (physician) and Advanced Practice Providers (APPs -  Physician Assistants and Nurse Practitioners) who all work together to provide you with the care you need, when you need it.  We recommend signing up for the patient portal called "MyChart".  Sign up information is provided on this After Visit Summary.  MyChart is used to connect with patients for Virtual Visits (Telemedicine).  Patients are able to view lab/test results, encounter notes, upcoming appointments, etc.  Non-urgent messages can be sent to your provider as well.   To learn more about what you can do with MyChart, go to [email or web address].    Your next appointment:   3 month(s)  Provider:   York Pellant, MD

## 2023-04-07 NOTE — Progress Notes (Signed)
  Electrophysiology Office Note:    Date:  04/07/2023   ID:  Lori [name], DOB 26-Oct-1940, MRN [id number]  PCP:  Shelva Majestic, MD   [facility] Providers Cardiologist:  Verne Carrow, MD Electrophysiologist:  Maurice Small, MD     Referring MD: Kathleene Hazel*   History of Present Illness:    Female Lori [name] is a 83 y.o. female with a medical history significant for frequent PVCs, referred for arrhythmia management.     She was initially seen in cardiology clinic by Dr. Clifton James in November 2024 for evaluation of palpitations, fatigue, dizziness.  Prior to this, she visited the ER in October 2024 with palpitations.  EKG showed sinus rhythm.  PVCs were apparent on her monitor.  She also had SVT runs, longest approximate 12 seconds.  Cardizem was started.  She developed a rash, diagnosed as lichenoid dermatitis by her dermatologist.  Cardizem was stopped.  Metoprolol was tried and she was referred to electrophysiology.       Today, she reports that she is at baseline.  Rare palpitations.  The metoprolol seems to have helped  EKGs/Labs/Other Studies Reviewed Today:     Echocardiogram:  TTE 03/07/2023 EF 50 to 55%.  Grade 1 diastolic dysfunction.  Normal biatrial size.    Monitors:  14 day monitor November 2024-- my interpretation Sinus rhythm heart rate 50 to 118 bpm, average 75 8 episodes of nonsustained SVT; longest was 12 seconds and correlated with atrial tachycardia. Supraventricular ectopy less than 1%; ventricular ectopy 1%, occasionally occurring in bigeminy and trigeminy Patient symptom episodes correlated with PVCs.  There were multiple PVC morphologies.  Some symptoms correlated with normal sinus rhythm   EKG:   EKG Interpretation Date/Time:  Thursday April 07 2023 12:01:26 EST Ventricular Rate:  70 PR Interval:  184 QRS Duration:  76 QT Interval:  392 QTC Calculation: 423 R Axis:   64  Text  Interpretation: Normal sinus rhythm Right atrial enlargement Minimal voltage criteria for LVH, may be normal variant ( Sokolow-Lyon ) When compared with ECG of 27-Dec-2022 12:01, PVCs no longer present Confirmed by York Pellant [phone]) on 04/07/2023 12:28:51 PM     Physical Exam:    VS:  BP (!) 180/90 (BP Location: Left Arm, Patient Position: Sitting, Cuff Size: Normal)   Pulse 70   Ht 5\' 4"  (1.626 m)   Wt 113 lb (51.3 kg)   SpO2 97%   BMI 19.40 kg/m     Wt Readings from Last 3 Encounters:  04/07/23 113 lb (51.3 kg)  03/11/23 113 lb 3.2 oz (51.3 kg)  03/02/23 114 lb (51.7 kg)     GEN: Well nourished, well developed in no acute distress CARDIAC: RRR, no murmurs, rubs, gallops RESPIRATORY:  Normal work of breathing MUSCULOSKELETAL: no edema    ASSESSMENT & PLAN:     PVCs Burden 1%, but she is symptomatic Additionally, symptoms of lightheadedness and palpitations oftentimes correlated with normal sinus rhythm I do not think she is a good candidate for antiarrhythmic drugs or ablation Will try magnesium taurate Continue metoprolol XL 25  Hypertension She has not yet taken her BP meds Continue metoprolol XL 25 Home Bps are better, per patient    Signed, Maurice Small, MD  04/07/2023 12:46 PM    [facility]

## 2023-04-07 NOTE — Telephone Encounter (Signed)
Pharmacy comment: we do not have magnesium taurate in stock or the ability to order it from any of our wholesalers. We can fill magnesium oxide, citrate, or glycinate. Would you like to change to one of those?

## 2023-04-20 ENCOUNTER — Other Ambulatory Visit: Payer: Self-pay | Admitting: Family Medicine

## 2023-04-21 ENCOUNTER — Ambulatory Visit: Payer: Medicare Other | Admitting: Family Medicine

## 2023-04-21 ENCOUNTER — Encounter: Payer: Self-pay | Admitting: Family Medicine

## 2023-04-21 VITALS — BP 140/64 | HR 66 | Ht 64.0 in | Wt 112.6 lb

## 2023-04-21 DIAGNOSIS — I10 Essential (primary) hypertension: Secondary | ICD-10-CM | POA: Diagnosis not present

## 2023-04-21 DIAGNOSIS — Z170 Estrogen receptor positive status [ER+]: Secondary | ICD-10-CM

## 2023-04-21 DIAGNOSIS — R739 Hyperglycemia, unspecified: Secondary | ICD-10-CM

## 2023-04-21 DIAGNOSIS — Z0000 Encounter for general adult medical examination without abnormal findings: Secondary | ICD-10-CM | POA: Diagnosis not present

## 2023-04-21 DIAGNOSIS — E559 Vitamin D deficiency, unspecified: Secondary | ICD-10-CM | POA: Diagnosis not present

## 2023-04-21 DIAGNOSIS — E785 Hyperlipidemia, unspecified: Secondary | ICD-10-CM | POA: Diagnosis not present

## 2023-04-21 DIAGNOSIS — C50311 Malignant neoplasm of lower-inner quadrant of right female breast: Secondary | ICD-10-CM | POA: Diagnosis not present

## 2023-04-21 DIAGNOSIS — Z131 Encounter for screening for diabetes mellitus: Secondary | ICD-10-CM | POA: Diagnosis not present

## 2023-04-21 DIAGNOSIS — I700 Atherosclerosis of aorta: Secondary | ICD-10-CM

## 2023-04-21 LAB — CBC WITH DIFFERENTIAL/PLATELET
Basophils Absolute: 0 10*3/uL (ref 0.0–0.1)
Basophils Relative: 0.7 % (ref 0.0–3.0)
Eosinophils Absolute: 0.1 10*3/uL (ref 0.0–0.7)
Eosinophils Relative: 2.5 % (ref 0.0–5.0)
HCT: 39.2 % (ref 36.0–46.0)
Hemoglobin: 12.7 g/dL (ref 12.0–15.0)
Lymphocytes Relative: 32 % (ref 12.0–46.0)
Lymphs Abs: 1.6 10*3/uL (ref 0.7–4.0)
MCHC: 32.4 g/dL (ref 30.0–36.0)
MCV: 87.7 fL (ref 78.0–100.0)
Monocytes Absolute: 0.6 10*3/uL (ref 0.1–1.0)
Monocytes Relative: 12.3 % — ABNORMAL HIGH (ref 3.0–12.0)
Neutro Abs: 2.7 10*3/uL (ref 1.4–7.7)
Neutrophils Relative %: 52.5 % (ref 43.0–77.0)
Platelets: 272 10*3/uL (ref 150.0–400.0)
RBC: 4.47 Mil/uL (ref 3.87–5.11)
RDW: 13.7 % (ref 11.5–15.5)
WBC: 5.1 10*3/uL (ref 4.0–10.5)

## 2023-04-21 LAB — VITAMIN D 25 HYDROXY (VIT D DEFICIENCY, FRACTURES): VITD: 38.4 ng/mL (ref 30.00–100.00)

## 2023-04-21 LAB — COMPREHENSIVE METABOLIC PANEL
ALT: 10 U/L (ref 0–35)
AST: 18 U/L (ref 0–37)
Albumin: 4.3 g/dL (ref 3.5–5.2)
Alkaline Phosphatase: 88 U/L (ref 39–117)
BUN: 20 mg/dL (ref 6–23)
CO2: 27 meq/L (ref 19–32)
Calcium: 9.6 mg/dL (ref 8.4–10.5)
Chloride: 101 meq/L (ref 96–112)
Creatinine, Ser: 0.57 mg/dL (ref 0.40–1.20)
GFR: 85.41 mL/min (ref >60.00)
Glucose, Bld: 85 mg/dL (ref 70–99)
Potassium: 3.9 meq/L (ref 3.5–5.1)
Sodium: 137 meq/L (ref 135–145)
Total Bilirubin: 0.4 mg/dL (ref 0.2–1.2)
Total Protein: 7.7 g/dL (ref 6.0–8.3)

## 2023-04-21 LAB — LIPID PANEL
Cholesterol: 224 mg/dL — ABNORMAL HIGH (ref 0–200)
HDL: 89.7 mg/dL (ref >39.00)
LDL Cholesterol: 123 mg/dL — ABNORMAL HIGH (ref 0–99)
NonHDL: 133.93
Total CHOL/HDL Ratio: 2
Triglycerides: 53 mg/dL (ref 0.0–149.0)
VLDL: 10.6 mg/dL (ref 0.0–40.0)

## 2023-04-21 LAB — HEMOGLOBIN A1C: Hgb A1c MFr Bld: 6.1 % (ref 4.6–6.5)

## 2023-04-21 NOTE — Patient Instructions (Addendum)
Let us know  when you get your TDap at the pharmacy.  Blood pressure slightly high today- monitor for next few days and update me- we discussed adding amlodipine 2.5 mg back in if regularly above 140  Please stop by lab before you go If you have mychart- we will send your results within 3 business days of [place] receiving them.  If you do not have mychart- we will call you about results within 5 business days of [place] receiving them.  *please also note that you will see labs on mychart as soon as they post. I will later go in and write notes on them- will say "notes from Dr. Durene Cal"   Recommended follow up: Return in about 6 months (around 10/19/2023) for followup or sooner if needed.Schedule b4 you leave.

## 2023-04-21 NOTE — Progress Notes (Signed)
Phone [phone]   Subjective:  Patient presents today for their annual physical. Chief complaint-noted.   See problem oriented charting- ROS- full  review of systems was completed and negative except for: Difficulty gaining weight, som knee pain, some palpitations with PVC and can get dizzy  The following were reviewed and entered/updated in epic: 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    Eczema    Family history of breast cancer    Family history of kidney cancer    Family history of multiple myeloma    Family history of thyroid cancer    GERD (gastroesophageal reflux disease)    Headache(784.0)    History of IBS    History of kidney stones    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    Hyperlipidemia    on medication   Hypertension    Low back pain    Meniere disease    Neuromuscular disorder (HCC)    raynaud's   Osteopenia    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HCC)    Stroke (HCC) 1988   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 carotid artery dissection    Tubular adenoma of colon 02/2013   Varicose veins with inflammation    upper and lower per pt    Vasculitis [facility])    Patient Active Problem List   Diagnosis Date Noted   History of stroke 10/22/2021    Priority: High   Malignant neoplasm of lower-inner quadrant of right breast of female, estrogen receptor positive (HCC) 10/17/2020    Priority: High   Hyperglycemia 01/06/2022    Priority: Medium    Parotid adenoma 06/26/2020    Priority: Medium    Osteoarthritis of left knee 09/15/2017    Priority: Medium     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    Priority: Medium    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    Priority: Medium    Aortic atherosclerosis (HCC) 02/11/2016    Priority: Medium    Hypertension 11/29/2013    Priority: Medium    Hyperlipidemia 11/29/2013    Priority: Medium    Barrett's esophagus 08/24/2013    Priority: Medium    Allergic rhinitis 03/09/2007    Priority: Medium    Osteoporosis 09/22/2006    Priority: Medium    IBS (irritable bowel syndrome) 06/26/2019    Priority: Low   Eczema 11/29/2013    Priority: Low   GERD (gastroesophageal reflux disease) 05/16/2013    Priority: Low   Vitamin D deficiency 01/29/2012    Priority: Low   Solitary pulmonary nodule 06/08/2011    Priority: Low   HIATAL HERNIA WITH REFLUX 02/06/2010    Priority: Low   DEGENERATIVE JOINT DISEASE, KNEE 03/14/2008    Priority: Low   VARICOSE VEINS LOWER EXTREMITIES W/INFLAMMATION 03/09/2007    Priority: Low   ACTINIC KERATOSIS, FOREHEAD, LEFT 03/09/2007    Priority: Low   Headache(784.0) 03/09/2007  Priority: Low   MENIERE'S DISEASE 09/22/2006    Priority: Low   RAYNAUD'S DISEASE 09/22/2006    Priority: Low   Abnormal ear sensation, right 02/27/2021    Priority: 1.   Family history of kidney cancer 10/22/2020    Priority: 1.   Family history of thyroid cancer 10/22/2020    Priority: 1.   Family history of multiple myeloma 10/22/2020    Priority: 1.   Family history of breast cancer 10/22/2020    Priority: 1.   Cerebral arterial aneurysm 11/02/2021   Bilateral sensorineural hearing loss 09/14/2021   Imbalance 09/14/2021   Genetic testing 11/10/2020   Subjective tinnitus of both ears 06/05/2020   Vertigo 01/22/2020   Anosmia 03/30/2017   Past Surgical History:  Procedure Laterality Date   arthroscopic knee  2009   left knee/ torn meniscus   BREAST LUMPECTOMY WITH RADIOACTIVE SEED LOCALIZATION Right 11/05/2020   Procedure: RIGHT BREAST LUMPECTOMY WITH RADIOACTIVE SEED LOCALIZATION;   Surgeon: Harriette Bouillon, MD;  Location: [facility];  Service: General;  Laterality: Right;   BUNIONECTOMY Right 1998   with other foot surgery    carotid artery disection  1988   Carotid Artery Dissection   CATARACT EXTRACTION, BILATERAL  07-18-2017,08-08-2017   CESAREAN SECTION  1988   1 time   COLONOSCOPY  2019   last 2019   DILATION AND CURETTAGE OF UTERUS  2004   EYE SURGERY     EYE SURGERY  09/25/2021   lowere lid of left eye   POLYPECTOMY     POPLITEAL SYNOVIAL CYST EXCISION     left leg   TONSILLECTOMY  1957   UPPER GASTROINTESTINAL ENDOSCOPY     last 2018    Family History  Problem Relation Age of Onset   Multiple myeloma Father 39   Thyroid cancer Sister 46       s/p removal. papilary and anaplastic.    Lung cancer Sister    Kidney disease Brother        cancer- removed   Kidney cancer Brother        dx early 85s   Alzheimer's disease Brother    Stroke Brother 53   Breast cancer Cousin 90       paternal first cousin   Cancer Cousin        unknown type, paternal first cousin   Breast cancer Other        mother's first cousin   Cervical cancer Other    Colon cancer Neg Hx    Esophageal cancer Neg Hx    Rectal cancer Neg Hx    Stomach cancer Neg Hx    Colon polyps Neg Hx     Medications- reviewed and updated Current Outpatient Medications  Medication Sig Dispense Refill   aspirin 81 MG tablet Take 81 mg by mouth daily. Evening     augmented betamethasone dipropionate (DIPROLENE-AF) 0.05 % cream Apply topically as needed.     cholecalciferol (VITAMIN D3) 25 MCG (1000 UNIT) tablet Take 1,000 Units by mouth daily. Take 1 tablet daily     cyanocobalamin 1000 MCG tablet Take 1,000 mcg by mouth once a week.     letrozole (FEMARA) 2.5 MG tablet TAKE 1 TABLET BY MOUTH EVERY DAY 90 tablet 3   loratadine (CLARITIN) 10 MG tablet Take 10 mg by mouth daily. Take 1/2 tablet daily     Magnesium 400 MG TABS Take 400 mg by mouth daily. Magnesium TAURATE 90  tablet 3  metoprolol succinate (TOPROL XL) 25 MG 24 hr tablet Take 1 tablet (25 mg total) by mouth daily. 90 tablet 3   montelukast (SINGULAIR) 10 MG tablet TAKE 1 TABLET BY MOUTH AT BEDTIME 90 tablet 1   ondansetron (ZOFRAN) 4 MG tablet TAKE 1 TABLET BY MOUTH EVERY 6 HOURS 12 tablet 0   pantoprazole (PROTONIX) 40 MG tablet TAKE 1 TABLET BY MOUTH EVERY DAY 90 tablet 1   atorvastatin (LIPITOR) 20 MG tablet Take 1 tablet (20 mg total) by mouth once a week. (Patient not taking: Reported on 04/21/2023) 13 tablet 3   No current facility-administered medications for this visit.    Allergies-reviewed and updated Allergies  Allergen Reactions   Azithromycin Other (See Comments)    Thrush   Bacitracin-Polymyxin B Itching    Burning and runny eye    Diltiazem Rash   Hydrocodone-Acetaminophen     VOMITING   Klonopin [Clonazepam]     -august 27th- took one dose and experienced zombie like sensation- states will never take agian   Neomycin    Nitrofurantoin     Reports tingling on tongue- no swelling   Penicillins     REACTION: Arm swelling   Pneumococcal Vaccine Polyvalent     ARM REDNESS WITH TENDERNESS   Pneumovax [Pneumococcal Polysaccharide Vaccine]    Triamcinolone Swelling    Lip,tongue and nose burning. Triamcinolone Cream 0.1%   Bacitracin Other (See Comments)   Hydrocodone Other (See Comments)   Other Other (See Comments)   Pneumococcal Vaccine Other (See Comments)    Social History   Social History Narrative   Lives with husband who is also a patient of Dr. Durene Cal. Gery Pray [address] 51, Berneda Rose (sees Dr. Durene Cal), Sarah 34. 3 grandkids in 2022   Objective  Objective:  BP (!) 140/64   Pulse 66   Ht 5\' 4"  (1.626 m)   Wt 112 lb 9.6 oz (51.1 kg)   SpO2 98%   BMI 19.33 kg/m  Gen: NAD, resting comfortably HEENT: Mucous membranes are moist. Oropharynx normal Neck: no thyromegaly CV: RRR no murmurs rubs or gallops Lungs: CTAB no crackles, wheeze, rhonchi Abdomen:  soft/nontender/nondistended/normal bowel sounds. No rebound or guarding.  Ext: no edema Skin: warm, dry Neuro: grossly normal, moves all extremities, PERRLA   Assessment and Plan   83 y.o. female presenting for annual physical.  Health Maintenance counseling: 1. Anticipatory guidance: Patient counseled regarding regular dental exams -q6 months Dr. Delena Bali, eye exams - yearly- Dr. Darcel Bayley no longer with [facility]- with new PA basically plucking the eyelashes,  avoiding smoking and second hand smoke , limiting alcohol to 1 beverage per day , no illicit drugs .   2. Risk factor reduction:  Advised patient of need for regular exercise and diet rich and fruits and vegetables to reduce risk of heart attack and stroke.  Exercise- not able to walk as much- doesn't want to trigger PVCs Diet/weight management-Down 5 pounds in the last 18 months-she is trying to gain weight- hard to eat healthy and she's not starving- dietary restrictions from reflux. Does well with bran and whole wheat. Doing fair life. May try whole milk for more calories Wt Readings from Last 3 Encounters:  04/21/23 112 lb 9.6 oz (51.1 kg)  04/07/23 113 lb (51.3 kg)  03/11/23 113 lb 3.2 oz (51.3 kg)  3. Immunizations/screenings/ancillary studies-poor reaction to Pneumovax 23in the past and was told off on Prevnar 20 again, already had flu shot.  Discussed now due for tetanus  at pharmacy  Immunization History  Administered Date(s) Administered   Fluad Quad(high Dose 65+) 12/01/2018, 12/27/2019, 12/01/2020, 01/06/2022   Influenza Split 01/04/2012   Influenza Whole 01/03/2007, 12/08/2007, 12/17/2008, 01/20/2010   Influenza, High Dose Seasonal PF 01/10/2015, 12/12/2015, 12/29/2016, 12/23/2017   Influenza,inj,Quad PF,6+ Mos 01/05/2013, 12/24/2013, 12/29/2022   PFIZER(Purple Top)SARS-COV-2 Vaccination 04/30/2019, 05/23/2019, 02/05/2020   Pfizer Covid-19 Vaccine Bivalent Booster 68yrs & up 02/03/2021   Pneumococcal Polysaccharide-23  02/12/2004   Td 03/23/2003, 04/04/2013   Zoster Recombinant(Shingrix) 11/13/2021, 01/14/2022   4. Cervical cancer screening- past age based screening recommendations. No discharge or blood   5. Breast cancer screening-  see breast cancer discussion below - mammogram 11/16/22 6. Colon cancer screening - February 2020 with no plans for repeat at this point per Dr. Russella Dar. No brbpr or melena   7. Skin cancer screening-[place] [facility] Dr. Doreen Beam - also had lichenoid dermatitis and that is getting better with steroid ream- yearly typically in January. advised regular sunscreen use. Denies worrisome, changing, or new skin lesions.   8. Birth control/STD check-postmenopausal and monogamous   9. Osteoporosis - treated by Dr. Pamelia Hoit on prolia but can no longer afford- see below  -Never smoker   Status of chronic or acute concerns   Note from 01/31/2023 Dr. Marylynn Pearson. Palpitations/fatigue/dizziness/chest pain: Recent cardiac monitor with several short runs of SVT but 1% burden of PVCs. I suspect that her PVCs are causing her symptoms. She does not wish to try a beta blocker as this caused her to be hypotensive in the past.  I will arrange an echo to assess LV systolic function and exclude structural heart disease.  Will stop Norvasc and start Cardizem CD 120 mg daily. Hopefully this will suppress her PVCs.  Will also arrange a Lexiscan nuclear stress test to exclude ischemia." - Echocardiogram EF 50-55 % mild to moderate mitral regurgitation - Lexiscan stress test- normal stress test 02/14/23 - cardiology had mentioned magnesium taurate but she is opting out for now    #Breast cancer-biopsy on 10/13/2020 uncovered invasive ductal carcinoma.  Patient with right lumpectomy 11/05/2020 with negative margins thankfully.  As a result she does not require adjuvant radiation.  She will be on letrozole daily for 5 years.  She is following with Dr. Pamelia Hoit.    # Hypertension S: Medication:Metoprolol 25 mg  extended release - Previously on amlodipine up to 5 mg, previously metoprolol caused bradycardia but tolerating 2025 BP Readings from Last 3 Encounters:  04/21/23 (!) 140/64  04/07/23 (!) 180/90  03/11/23 (!) 150/78  A/P:  Blood pressure slightly high today- monitor for next few days and update me- we discussed adding amlodipine 2.5 mg back in if regularly above 140  # GERD/barrett's esophagus not noted 10/2019 Dr. Russella Dar- q3 year endoscopy S:Compliant with protonix 40mg  daily- using [employer] coffee and manuka honey starting late 2019 has been very helpful A/P: if things remain settled she is due to schedule follow up with gastrointestinal to consider endoscopy   # Hyperlipidemia/aortic atherosclerosis/statin myalgia #history of stroke on imaging- remote- on asa 81 mg  S:medication: None  -Felt poorly on rosuvastatin 10 mg 3x a week but poor control and just once weekly, also felt poorly on atorvastatin 20 mg weekly but may have been the PVCs  -prior fenofbrate  LDL goal at least under 100 and triglyceride goal under 200.  Lab Results  Component Value Date   CHOL 198 10/25/2022   HDL 86.70 10/25/2022   LDLCALC 101 (H) 10/25/2022   LDLDIRECT 74.0  02/06/2016   TRIG 52.0 10/25/2022   CHOLHDL 2 10/25/2022  A/P: cholesterol may be worse off of medicine- update lipids today and may restart now that PVC situation is better- that may have bene what was causing her to feel  poorly before  #Allergic rhinitis S: Singulair works extremely well for her  A/P: doing well on this- continue current medications - not needing claritin  # Osteoporosis-Prolia was too costly, Fosamax would be heavy GERD risk. Would be due for DEXA but she would declines management. 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 Future Appointments  Date Time Provider Department Center  07/11/2023  2:15 PM Mealor, Roberts Gaudy, MD [facility]  08/23/2023  1:45 PM Serena Croissant, MD [facility] None  10/20/2023  2:00 PM [facility] ANNUAL WELLNESS VISIT 1 [facility]   Lab/Order associations: fasting   ICD-10-CM   1. Preventative health care  Z00.00     2. Screening for diabetes mellitus  Z13.1     3. Primary hypertension  I10     4. Hyperlipidemia, unspecified hyperlipidemia type  E78.5     5. Aortic atherosclerosis (HCC)  I70.0     6. Vitamin D deficiency  E55.9     7. Hyperglycemia  R73.9     8. Malignant neoplasm of lower-inner quadrant of right breast of female, estrogen receptor positive (HCC) Chronic C50.311    Z17.0       No orders of the defined types were placed in this encounter.   Return precautions advised.  Tana Conch, MD

## 2023-05-02 MED ORDER — ATORVASTATIN CALCIUM 20 MG PO TABS: 20.0000 mg | ORAL_TABLET | ORAL | 3 refills | Status: DC

## 2023-05-02 MED ORDER — AMLODIPINE BESYLATE 2.5 MG PO TABS: 2.5000 mg | ORAL_TABLET | Freq: Every day | ORAL | 3 refills | Status: DC

## 2023-05-12 ENCOUNTER — Other Ambulatory Visit: Payer: Self-pay | Admitting: Family Medicine

## 2023-05-19 ENCOUNTER — Other Ambulatory Visit: Payer: Self-pay | Admitting: Hematology and Oncology

## 2023-05-24 ENCOUNTER — Other Ambulatory Visit: Payer: Self-pay | Admitting: Family Medicine

## 2023-05-24 ENCOUNTER — Telehealth: Payer: Self-pay

## 2023-05-24 NOTE — Telephone Encounter (Signed)
 Updated Rx has been sent to pharmacy.   Copied from CRM [id number]. Topic: Clinical - Prescription Issue >> May 24, 2023  4:23 PM Sonny Dandy B wrote: Reason for CRM: [facility] called to request a new prescription for metoprolol succinate (TOPROL XL) 25 MG 24 hr   1/ 1/2 dosage. For pt . Pt informed the pharmacy that she was advised to take 1 1/2 pills/ Please call the pharmacy back to clarify and update them with a new prescription

## 2023-05-25 DIAGNOSIS — H02055 Trichiasis without entropian left lower eyelid: Secondary | ICD-10-CM | POA: Diagnosis not present

## 2023-05-25 DIAGNOSIS — H16212 Exposure keratoconjunctivitis, left eye: Secondary | ICD-10-CM | POA: Diagnosis not present

## 2023-05-25 DIAGNOSIS — H0279 Other degenerative disorders of eyelid and periocular area: Secondary | ICD-10-CM | POA: Diagnosis not present

## 2023-05-30 ENCOUNTER — Encounter: Payer: Self-pay | Admitting: Podiatry

## 2023-05-30 ENCOUNTER — Ambulatory Visit: Admitting: Podiatry

## 2023-05-30 VITALS — Ht 67.0 in | Wt 113.0 lb

## 2023-05-30 DIAGNOSIS — B351 Tinea unguium: Secondary | ICD-10-CM

## 2023-05-30 DIAGNOSIS — L280 Lichen simplex chronicus: Secondary | ICD-10-CM

## 2023-05-30 DIAGNOSIS — M79674 Pain in right toe(s): Secondary | ICD-10-CM | POA: Diagnosis not present

## 2023-05-30 DIAGNOSIS — M79675 Pain in left toe(s): Secondary | ICD-10-CM

## 2023-05-30 DIAGNOSIS — L600 Ingrowing nail: Secondary | ICD-10-CM | POA: Diagnosis not present

## 2023-05-30 DIAGNOSIS — L602 Onychogryphosis: Secondary | ICD-10-CM | POA: Diagnosis not present

## 2023-05-30 HISTORY — DX: Lichen simplex chronicus: L28.0

## 2023-05-30 NOTE — Progress Notes (Unsigned)
 Chief Complaint  Patient presents with   Nail Problem    " I have an ingrown on the right big toe, and the left big toes has what looks like a fungus and I am very sensitive to using some creams on my skin"    HPI: 83 y.o. female presents today with concern of a very thick discolored and painful toenail to the left great toe.  As she would like to treat the possible fungus in the toenail.  She notes that she has a lot of sensitivities to different products and may prefer to try something over-the-counter.  Her right great toenail along the lateral border feels ingrown.  She notes that the second toe has been pushing against it.  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    Eczema    Family history of breast cancer    Family history of kidney cancer    Family history of multiple myeloma    Family history of thyroid cancer    GERD (gastroesophageal reflux disease)    Headache(784.0)    History of IBS    History of kidney stones    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    Hyperlipidemia    on medication   Hypertension    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   Low back pain    Meniere disease    Neuromuscular disorder (HCC)    raynaud's   Osteopenia    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HCC)    Stroke (HCC) 1988   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 carotid artery dissection    Tubular adenoma of colon 02/2013   Varicose veins with inflammation    upper and lower per pt    Vasculitis [facility])     Past Surgical History:  Procedure Laterality Date   arthroscopic knee  2009    left knee/ torn meniscus   BREAST LUMPECTOMY WITH RADIOACTIVE SEED LOCALIZATION Right 11/05/2020   Procedure: RIGHT BREAST LUMPECTOMY WITH RADIOACTIVE SEED LOCALIZATION;  Surgeon: Harriette Bouillon, MD;  Location: [facility];  Service: General;  Laterality: Right;   BUNIONECTOMY Right 1998   with other foot surgery    carotid artery disection  1988   Carotid Artery Dissection   CATARACT EXTRACTION, BILATERAL  07-18-2017,08-08-2017   CESAREAN SECTION  1988   1 time   COLONOSCOPY  2019   last 2019   DILATION AND CURETTAGE OF UTERUS  2004   EYE SURGERY     EYE SURGERY  09/25/2021   lowere lid of left eye   POLYPECTOMY     POPLITEAL SYNOVIAL CYST EXCISION     left leg   TONSILLECTOMY  1957   UPPER GASTROINTESTINAL ENDOSCOPY     last 2018    Allergies  Allergen Reactions   Azithromycin Other (See Comments)    Thrush   Bacitracin-Polymyxin B Itching  Burning and runny eye    Diltiazem Rash   Hydrocodone-Acetaminophen     VOMITING   Klonopin [Clonazepam]     -august 27th- took one dose and experienced zombie like sensation- states will never take agian   Neomycin    Nitrofurantoin     Reports tingling on tongue- no swelling   Penicillins     REACTION: Arm swelling   Pneumococcal Vaccine Polyvalent     ARM REDNESS WITH TENDERNESS   Pneumovax [Pneumococcal Polysaccharide Vaccine]    Triamcinolone Swelling    Lip,tongue and nose burning. Triamcinolone Cream 0.1%   Bacitracin Other (See Comments)   Hydrocodone Other (See Comments)   Other Other (See Comments)   Pneumococcal Vaccine Other (See Comments)     Physical Exam: Palpable pedal pulses noted bilateral.  The left hallux nail is 5 to 6 mm thick, with abnormal morphology, severely elongated, with subungual debris, distal onycholysis and pain with compression.  The right hallux nail has mild incurvation along the lateral border but it appears that the second toe is pushing the skin up and over the lateral  border of the hallux toenail.  There is some pain on palpation along the distal lateral border of the right hallux nail.  No clinical signs of infection are noted.  No interdigital corn is noted.  There is some medial angulation of the right second toe.  Assessment/Plan of Care: 1. Onychogryphosis   2. Dermatophytosis of nail   3. Pain in left toe(s)   4. Pain in right toe(s)   5. Ingrown toenail    Discussed clinical findings with patient today.  The left hallux nail was debrided to decrease girth and length utilizing sterile nail nippers and a power debriding bur.  As she would like to try either tea tree oil or some other type of over-the-counter antifungal product.  We discussed some options with the patient today.  She will apply it daily for up to a year and we will follow-up in approximately 3 to 4 months.  Discussed options as far as the right hallux lateral nail border discomfort.  She declined a PNA today after explaining the procedure in detail.  The distal lateral nail border was cut back and antibiotic ointment was applied.  She will apply this ointment nightly for the next 3-4 nights to soften up the skin.  She was fitted for toe spacers for the right first interspace.  Follow-up in 3 to 4 months for fungal nail recheck.   Clerance Lav, DPM, FACFAS [facility]     [address] [place], [place] [address]                Office [phone]  Fax [phone]

## 2023-06-22 IMAGING — MR MR MRA HEAD W/O CM
2 series · 20 of 48 positions shown · non-contrast
Comparison: CTA 01/18/2020.

CLINICAL DATA: Cerebral aneurysm, nonruptured.

EXAM:
MRA HEAD WITHOUT CONTRAST
TECHNIQUE: Angiographic images of the Circle of Willis were acquired using MRA
technique without intravenous contrast.

[Series 5: vessel_scout_head_msum · sagittal · 6.0mm · 0.59mm/px · 4 of 15 slices shown]
[im 1/15]
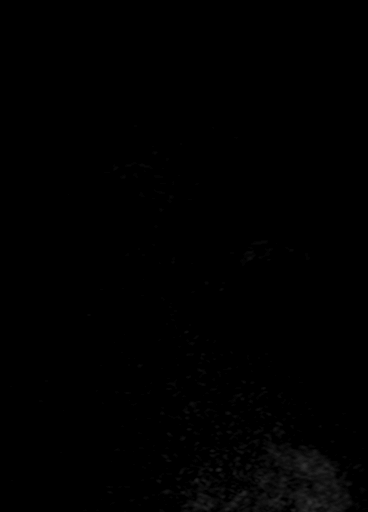
[im 5/15]
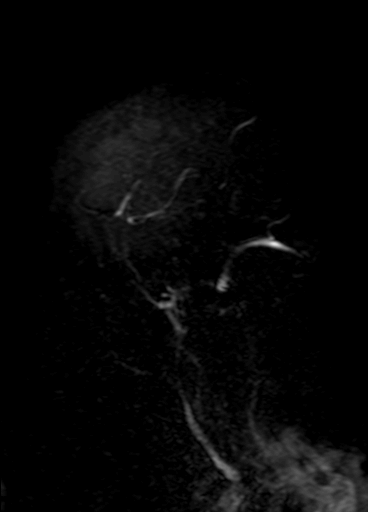
[im 10/15]
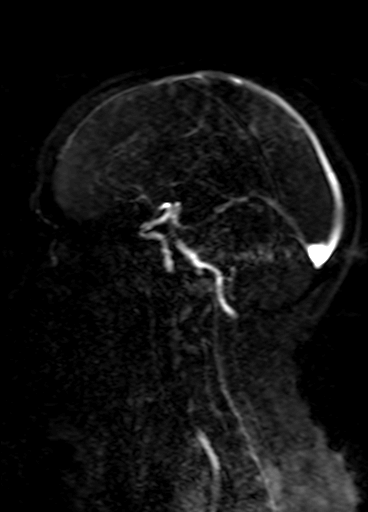
[im 15/15]
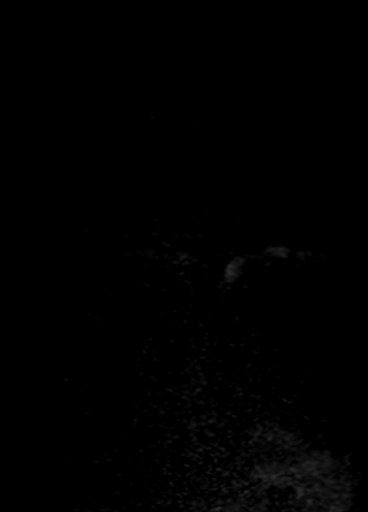

[Series 8: TOF · axial · 0.6mm · 0.35mm/px · z∈[-26,+72]mm · 16 of 172 slices shown]
[im 1/172]
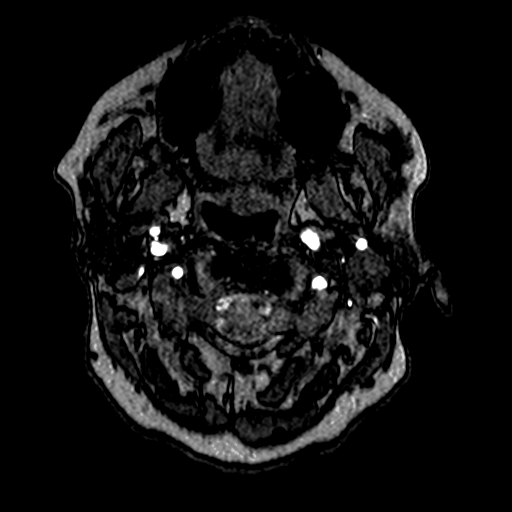
[im 4/172]
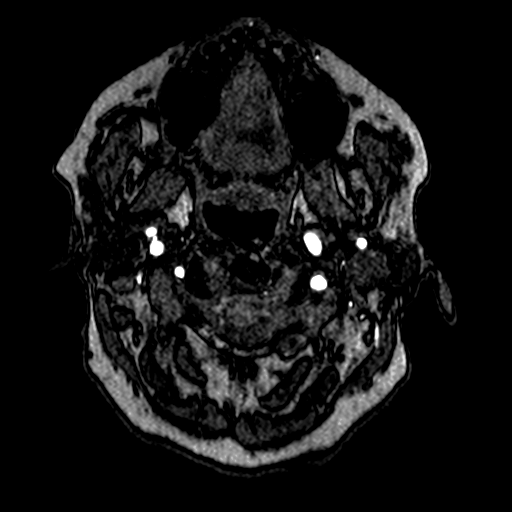
[im 8/172]
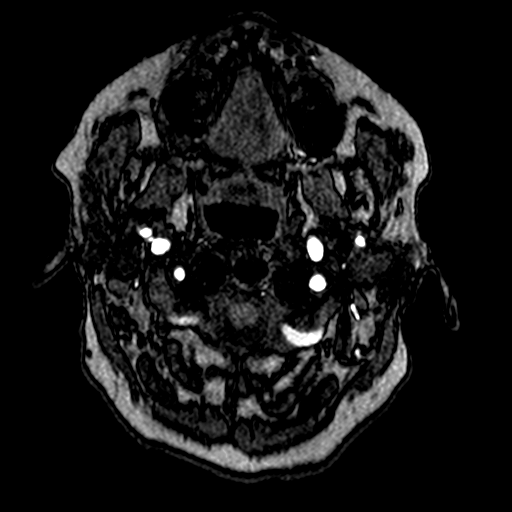
[im 12/172]
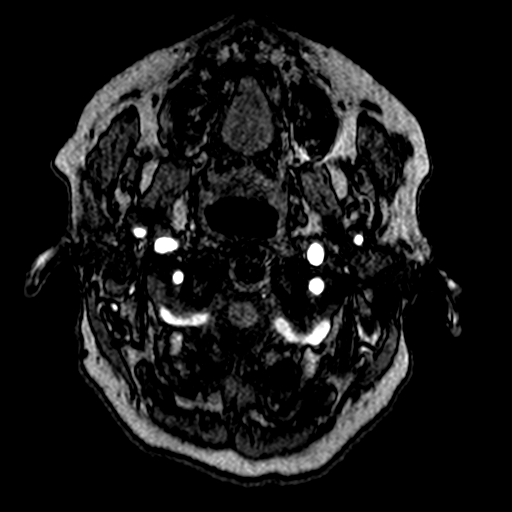
[im 16/172]
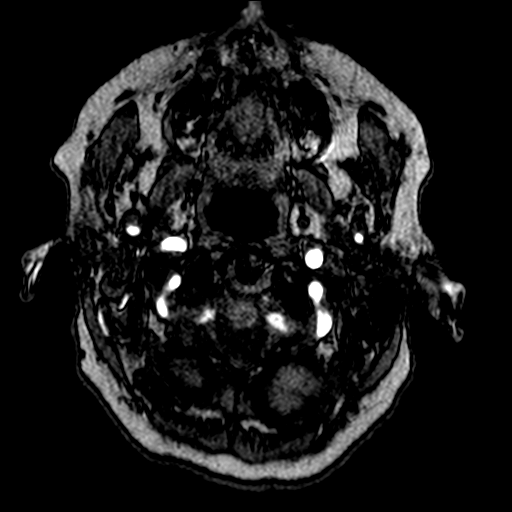
[im 20/172]
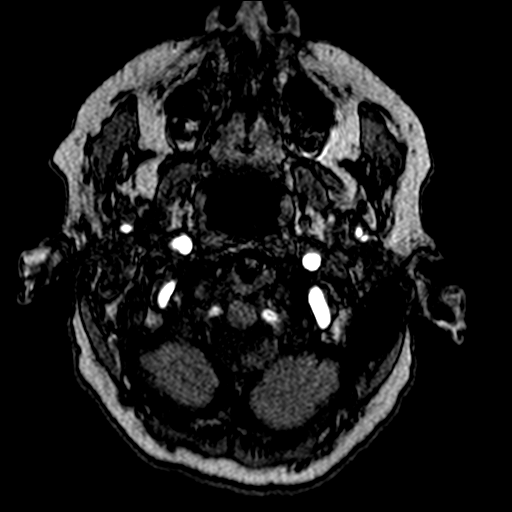
[im 28/172]
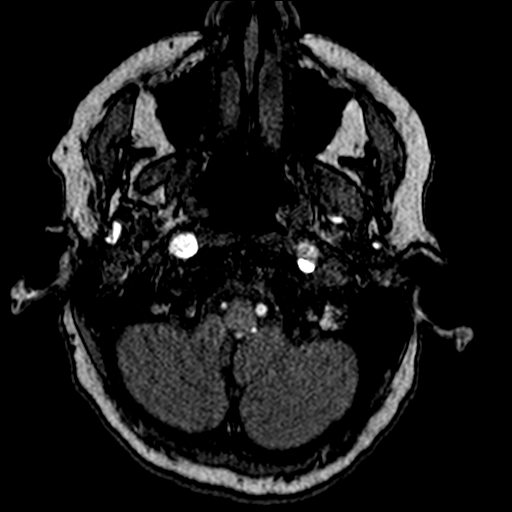
[im 32/172]
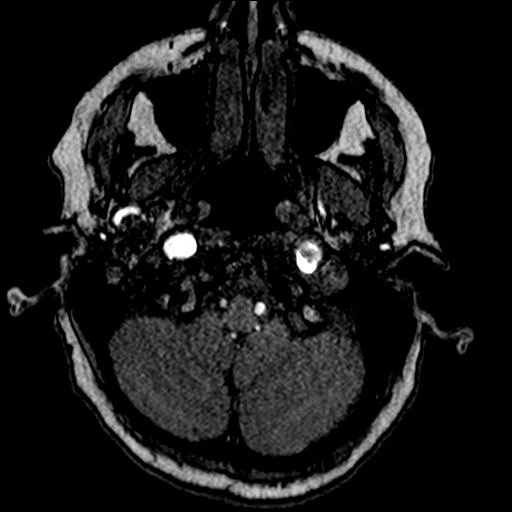
[im 52/172]
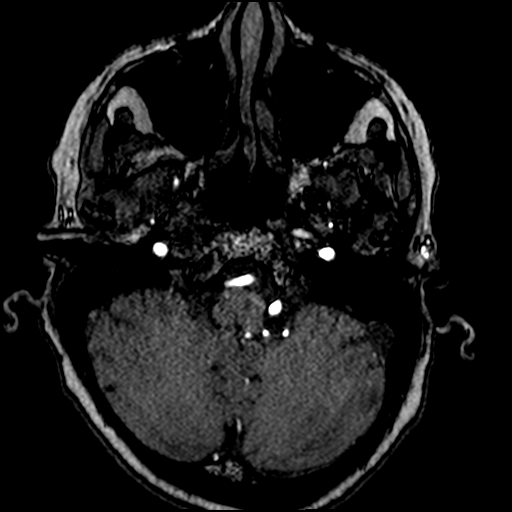
[im 76/172]
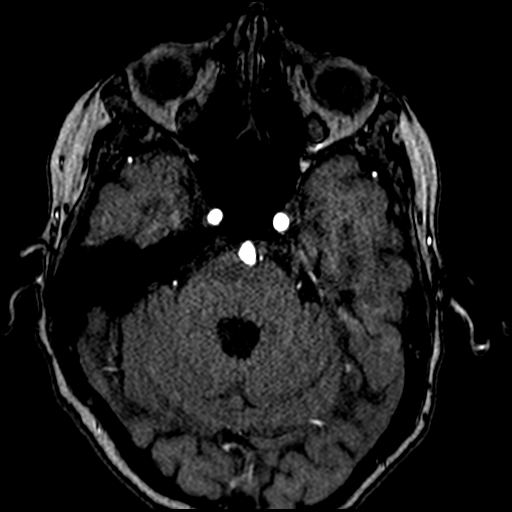
[im 88/172]
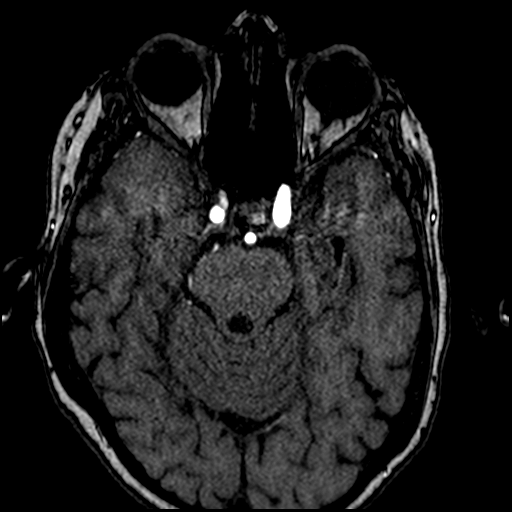
[im 96/172]
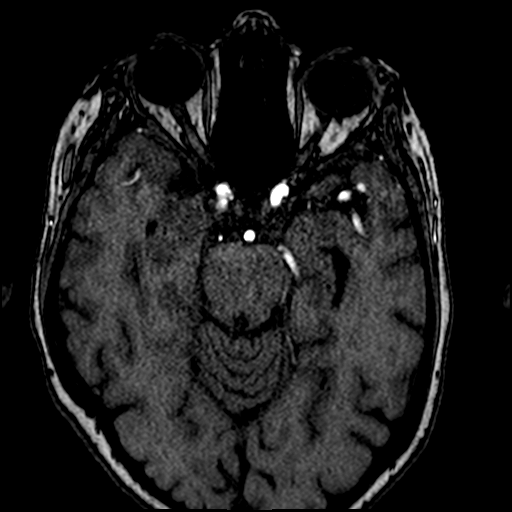
[im 120/172]
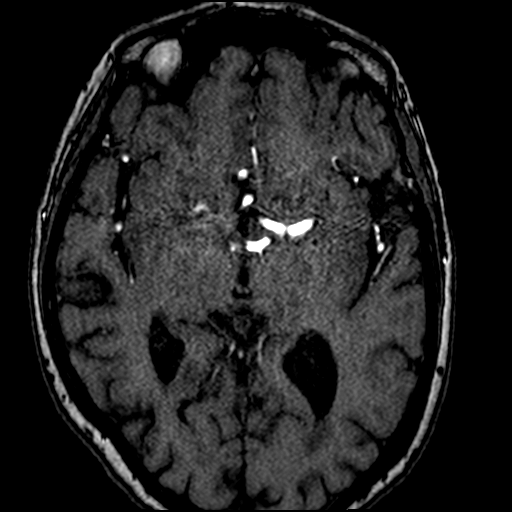
[im 140/172]
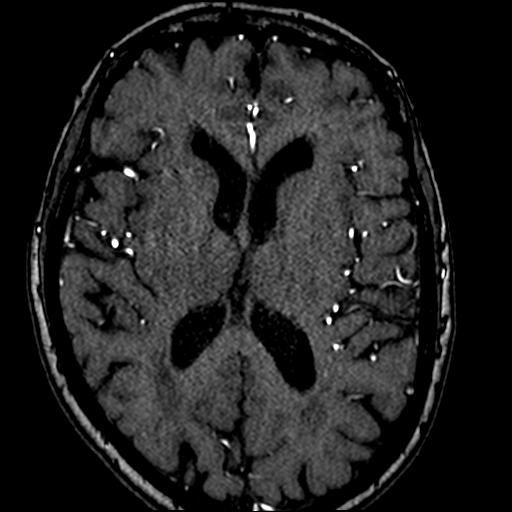
[im 144/172]
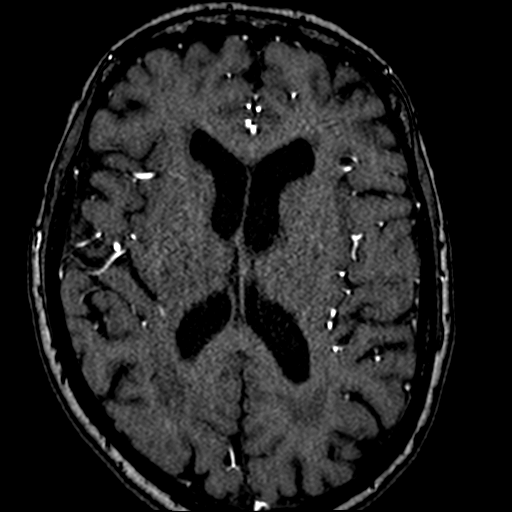
[im 164/172]
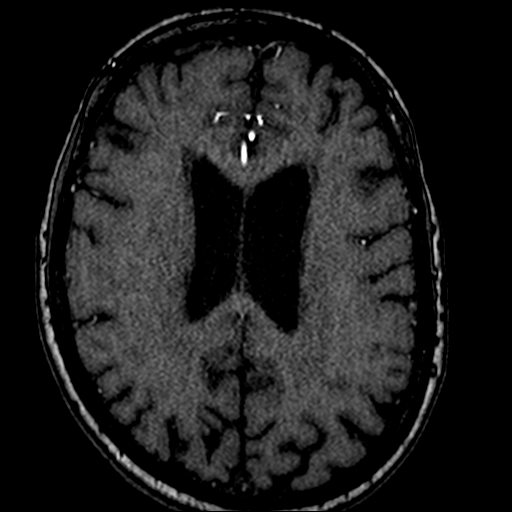

[20 of 48 positions shown; findings below may reference images not displayed]

FINDINGS: Anterior circulation: Preserved flow related enhancement of the
visualized upper cervical segment of the internal carotid arteries
with stable appearance of bilateral pseudoaneurysms measuring
approximately 9 x 5 mm on the right and 10 x 7 mm on the left. These
pseudoaneurysms do not cause mass effect or flow limitation on the
true lumen on either side.

Medially projecting outpouching from the paraclinoid segment of the
right ICA measuring approximately 2 mm likely representing a
superior hypophyseal artery aneurysm. This aneurysm was present on
cerebral angiogram performed in 5665 and appear stable. The
bilateral internal carotid arteries are otherwise maintained. Normal
caliber and flow related enhancement of the bilateral MCA and ACA
vascular trees.

Posterior circulation: Is normal caliber and flow related
enhancement of the intracranial vertebral arteries with left
dominance. The basilar artery and bilateral posterior cerebral
arteries are also unremarkable. No evidence of hemodynamically
significant stenosis, aneurysm or vascular malformation.

Anatomic variants: None significant.

Other: None.
IMPRESSION: 1. Stable appearance of bilateral upper cervical internal carotid
arteries pseudoaneurysms.
2. Stable 2 mm right superior hypophyseal aneurysm since [DATE].

## 2023-07-11 ENCOUNTER — Ambulatory Visit: Payer: Medicare Other | Admitting: Cardiovascular Disease

## 2023-07-12 ENCOUNTER — Encounter: Payer: Self-pay | Admitting: Family Medicine

## 2023-07-12 ENCOUNTER — Ambulatory Visit: Payer: Self-pay | Admitting: *Deleted

## 2023-07-12 ENCOUNTER — Ambulatory Visit (INDEPENDENT_AMBULATORY_CARE_PROVIDER_SITE_OTHER): Admitting: Family Medicine

## 2023-07-12 VITALS — BP 141/68 | HR 77 | Temp 97.7°F | Ht 67.0 in | Wt 115.8 lb

## 2023-07-12 DIAGNOSIS — N3001 Acute cystitis with hematuria: Secondary | ICD-10-CM | POA: Diagnosis not present

## 2023-07-12 DIAGNOSIS — R300 Dysuria: Secondary | ICD-10-CM | POA: Diagnosis not present

## 2023-07-12 LAB — POC URINALSYSI DIPSTICK (AUTOMATED)
Bilirubin, UA: NEGATIVE
Glucose, UA: NEGATIVE
Ketones, UA: NEGATIVE
Nitrite, UA: NEGATIVE
Protein, UA: NEGATIVE
Spec Grav, UA: 1.015 (ref 1.010–1.025)
Urobilinogen, UA: 0.2 U/dL
pH, UA: 6 (ref 5.0–8.0)

## 2023-07-12 MED ORDER — CEFUROXIME AXETIL 250 MG PO TABS: 250.0000 mg | ORAL_TABLET | Freq: Two times a day (BID) | ORAL | 0 refills | Status: DC

## 2023-07-12 NOTE — Progress Notes (Signed)
 Subjective:     Patient ID: Lori [name], female    DOB: 1941/03/18, 83 y.o.   MRN: [id number]  Chief Complaint  Patient presents with   burning with urination   Urinary Frequency    HPI Discussed the use of AI scribe software for clinical note transcription with the patient, who gave verbal consent to proceed.  History of Present Illness Lori [name] is an 83 year old female who presents with symptoms suggestive of a urinary tract infection.  She has been experiencing symptoms for the past four to five days, including increased urinary frequency, nocturia with episodes of waking up two to three times per night, dysuria, and a sensation of incomplete bladder emptying. She has a history of UTIs, with the last episode occurring in August, which required a change in antibiotics after initial treatment was ineffective. In August, she was initially treated with an antibiotic that was discontinued after three days due to ineffectiveness, and she was subsequently switched to a sulfa -based antibiotic, which resolved the infection. She is currently using over-the-counter Azo for symptomatic relief.No abdominal pain, back pain, fevers, or chills  She discusses her ongoing issue with lichenoid dermatitis, which was diagnosed after a biopsy by her dermatologist. The rash is primarily located on her back, requiring her husband's assistance to apply a steroid cream. The condition has persisted since November or December. She has a history of eczema on her hands.  . She mentions having PVCs in the past but reports doing better with them now. She is cautious about medication allergies, particularly to sulfa  drugs, and has a history of multiple medication allergies.    There are no preventive care reminders to display for this patient.  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   Anxiety     Aortic atherosclerosis (HCC)    Arthritis    knee- DJD    Barrett's esophagus    Breast cancer (HCC)    right breast IDC   Burning mouth syndrome    Dr Tellis Feathers 11-2016 - no smell or taste x 4 yrs per pt    Cataract    bilateral    Clotting disorder (HCC) 1988   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    Eczema    Family history of breast cancer    Family history of kidney cancer    Family history of multiple myeloma    Family history of thyroid  cancer    GERD (gastroesophageal reflux disease)    Headache(784.0)    History of IBS    History of kidney stones    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    Hyperlipidemia    on medication   Hypertension    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   Low back pain    Meniere disease    Neuromuscular disorder (HCC)    raynaud's   Osteopenia    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HCC)    Stroke (HCC) 1988   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 carotid artery dissection    Tubular adenoma of colon 02/2013   Varicose veins with inflammation    upper and lower per pt    Vasculitis [facility])     Past Surgical History:  Procedure Laterality Date   arthroscopic knee  2009   left knee/ torn meniscus   BREAST LUMPECTOMY WITH RADIOACTIVE SEED  LOCALIZATION Right 11/05/2020   Procedure: RIGHT BREAST LUMPECTOMY WITH RADIOACTIVE SEED LOCALIZATION;  Surgeon: Sim Dryer, MD;  Location: [facility];  Service: General;  Laterality: Right;   BUNIONECTOMY Right 1998   with other foot surgery    carotid artery disection  1988   Carotid Artery Dissection   CATARACT EXTRACTION, BILATERAL  07-18-2017,08-08-2017   CESAREAN SECTION  1988   1 time   COLONOSCOPY  2019   last 2019   DILATION AND CURETTAGE OF UTERUS  2004   EYE SURGERY     EYE SURGERY  09/25/2021   lowere lid of left eye   POLYPECTOMY     POPLITEAL SYNOVIAL CYST EXCISION     left leg   TONSILLECTOMY  1957   UPPER GASTROINTESTINAL ENDOSCOPY     last 2018     Current Outpatient  Medications:    amLODipine  (NORVASC ) 2.5 MG tablet, Take 1 tablet (2.5 mg total) by mouth daily., Disp: 90 tablet, Rfl: 3   aspirin 81 MG tablet, Take 81 mg by mouth daily. Evening, Disp: , Rfl:    atorvastatin  (LIPITOR) 20 MG tablet, Take 1 tablet (20 mg total) by mouth 2 (two) times a week., Disp: 26 tablet, Rfl: 3   augmented betamethasone dipropionate (DIPROLENE-AF) 0.05 % cream, Apply topically as needed., Disp: , Rfl:    cefUROXime  (CEFTIN ) 250 MG tablet, Take 1 tablet (250 mg total) by mouth 2 (two) times daily with a meal., Disp: 14 tablet, Rfl: 0   cholecalciferol (VITAMIN D3) 25 MCG (1000 UNIT) tablet, Take 1,000 Units by mouth daily. Take 1 tablet daily, Disp: , Rfl:    cyanocobalamin  1000 MCG tablet, Take 1,000 mcg by mouth once a week., Disp: , Rfl:    letrozole  (FEMARA ) 2.5 MG tablet, TAKE 1 TABLET BY MOUTH EVERY DAY, Disp: 90 tablet, Rfl: 3   metoprolol  succinate (TOPROL -XL) 25 MG 24 hr tablet, Take 1.5 tablets daily., Disp: 135 tablet, Rfl: 3   montelukast  (SINGULAIR ) 10 MG tablet, TAKE 1 TABLET BY MOUTH AT BEDTIME, Disp: 90 tablet, Rfl: 1   ondansetron  (ZOFRAN ) 4 MG tablet, TAKE 1 TABLET BY MOUTH EVERY 6 HOURS, Disp: 12 tablet, Rfl: 0   pantoprazole  (PROTONIX ) 40 MG tablet, TAKE 1 TABLET BY MOUTH EVERY DAY, Disp: 90 tablet, Rfl: 1  Allergies  Allergen Reactions   Azithromycin  Other (See Comments)    Thrush   Bacitracin-Polymyxin B Itching    Burning and runny eye    Diltiazem  Rash   Hydrocodone-Acetaminophen     VOMITING   Klonopin  [Clonazepam ]     -august 27th- took one dose and experienced zombie like sensation- states will never take agian   Neomycin    Nitrofurantoin      Reports tingling on tongue- no swelling   Penicillins     REACTION: Arm swelling   Pneumococcal Vaccine Polyvalent     ARM REDNESS WITH TENDERNESS   Pneumovax [Pneumococcal Polysaccharide Vaccine]    Triamcinolone  Swelling    Lip,tongue and nose burning. Triamcinolone  Cream 0.1%   Bacitracin  Other (See Comments)   Hydrocodone Other (See Comments)   Other Other (See Comments)   Pneumococcal Vaccine Other (See Comments)   ROS neg/noncontributory except as noted HPI/below      Objective:     BP (!) 141/68   Pulse 77   Temp 97.7 F (36.5 C)   Ht 5\' 7"  (1.702 m)   Wt 115 lb 12.8 oz (52.5 kg)   SpO2 97%   BMI  18.14 kg/m  Wt Readings from Last 3 Encounters:  07/12/23 115 lb 12.8 oz (52.5 kg)  05/30/23 113 lb (51.3 kg)  04/21/23 112 lb 9.6 oz (51.1 kg)    Physical Exam   Gen: WDWN NAD HEENT: NCAT, conjunctiva not injected, sclera nonicteric CARDIAC: RRR, S1S2+, no murmur.  LUNGS: CTAB. No wheezes ABDOMEN:  BS+, soft, NTND, No HSM, no masses.  No cvat EXT:  no edema MSK: no gross abnormalities.  NEURO: A&O x3.  CN II-XII intact.  PSYCH: normal mood. Good eye contact  Results for orders placed or performed in visit on 07/12/23  POCT Urinalysis Dipstick (Automated)   Collection Time: 07/12/23  1:30 PM  Result Value Ref Range   Color, UA yellow    Clarity, UA cloudy    Glucose, UA Negative Negative   Bilirubin, UA negative    Ketones, UA negative    Spec Grav, UA 1.015 1.010 - 1.025   Blood, UA 3+ (A)    pH, UA 6.0 5.0 - 8.0   Protein, UA Negative Negative   Urobilinogen, UA 0.2 0.2 or 1.0 E.U./dL   Nitrite, UA negative    Leukocytes, UA Large (3+) (A) Negative       Assessment & Plan:  Dysuria -     POCT Urinalysis Dipstick (Automated) -     Urine Culture  Other orders -     Cefuroxime  Axetil; Take 1 tablet (250 mg total) by mouth 2 (two) times daily with a meal.  Dispense: 14 tablet; Refill: 0  Assessment and Plan Assessment & Plan Urinary tract infection   She presents with suspected UTI, experiencing increased urinary frequency, nocturia, dysuria, and incomplete bladder emptying for 4-5 days. There is no abdominal pain, back pain, fevers, or chills. Her history includes previous UTIs treated with antibiotics, with a switch from Keflex  to sulfa   due to ineffectiveness. Current symptoms suggest a UTI despite . Considering cefdinir  Obtain a urine sample for urinalysis and culture. Prescribe cefdinir for suspected UTI, pending culture results. Advise continuation of over-the-counter Azo for symptomatic relief of dysuria.  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 There is no contribution to urinary symptoms. Concern exists about potential interactions with antibiotics due to dermatitis and multiple drug allergies. Continue current topical steroid cream regimen for lichenoid dermatitis.    Return if symptoms worsen or fail to improve.  Ellsworth Haas, MD

## 2023-07-12 NOTE — Telephone Encounter (Signed)
  Chief Complaint: urinary pressure, burning, frequency Symptoms: see above Frequency: 4-5 days Pertinent Negatives: Patient denies fever Disposition: [] ED /[] Urgent Care (no appt availability in office) / [x] Appointment(In office/virtual)/ []  [facility]/ [] Home Care/ [] Refused Recommended Disposition /[] [facility]/ []  Follow-up with PCP Additional Notes: Appointment scheduled    Copied from CRM [id number]. Topic: Clinical - Red Word Triage >> Jul 12, 2023  9:17 AM Juluis Ok wrote: [facility] that prompted transfer to Nurse Triage: burning and difficulty urinating x 5 days Reason for Disposition  Urinating more frequently than usual (i.e., frequency)  Answer Assessment - Initial Assessment Questions 1. SYMPTOM: "What's the main symptom you're concerned about?" (e.g., frequency, incontinence)     Pressure, frequency 2. ONSET: "When did the  symptoms  start?"     5 days 3. PAIN: "Is there any pain?" If Yes, ask: "How bad is it?" (Scale: 1-10; mild, moderate, severe)     burning 4. CAUSE: "What do you think is causing the symptoms?"     UTI 5. OTHER SYMPTOMS: "Do you have any other symptoms?" (e.g., blood in urine, fever, flank pain, pain with urination)     Burning with urination  Protocols used: Urinary Symptoms-A-AH

## 2023-07-12 NOTE — Patient Instructions (Signed)
Ceftin sent to pharmacy.

## 2023-07-13 ENCOUNTER — Encounter: Payer: Self-pay | Admitting: Family Medicine

## 2023-07-13 DIAGNOSIS — H02055 Trichiasis without entropian left lower eyelid: Secondary | ICD-10-CM | POA: Diagnosis not present

## 2023-07-13 DIAGNOSIS — H16212 Exposure keratoconjunctivitis, left eye: Secondary | ICD-10-CM | POA: Diagnosis not present

## 2023-07-13 DIAGNOSIS — H0279 Other degenerative disorders of eyelid and periocular area: Secondary | ICD-10-CM | POA: Diagnosis not present

## 2023-07-14 LAB — URINE CULTURE
MICRO NUMBER:: 16359450
SPECIMEN QUALITY:: ADEQUATE

## 2023-07-15 ENCOUNTER — Encounter: Payer: Self-pay | Admitting: Family Medicine

## 2023-07-15 NOTE — Progress Notes (Signed)
 For her UTI-how is she doing?  This doesn't give us  sensitivities for the med she is on.  She is allergic to the other ones on the list x vanco but that is IV.  If doing better, finish meds

## 2023-07-25 ENCOUNTER — Ambulatory Visit (INDEPENDENT_AMBULATORY_CARE_PROVIDER_SITE_OTHER): Admitting: Family Medicine

## 2023-07-25 ENCOUNTER — Encounter: Payer: Self-pay | Admitting: Family Medicine

## 2023-07-25 VITALS — BP 134/61 | HR 89 | Temp 97.3°F | Ht 67.0 in | Wt 114.6 lb

## 2023-07-25 DIAGNOSIS — N3090 Cystitis, unspecified without hematuria: Secondary | ICD-10-CM | POA: Diagnosis not present

## 2023-07-25 DIAGNOSIS — I10 Essential (primary) hypertension: Secondary | ICD-10-CM

## 2023-07-25 DIAGNOSIS — G720 Drug-induced myopathy: Secondary | ICD-10-CM | POA: Diagnosis not present

## 2023-07-25 DIAGNOSIS — E785 Hyperlipidemia, unspecified: Secondary | ICD-10-CM | POA: Diagnosis not present

## 2023-07-25 NOTE — Progress Notes (Signed)
 Phone [phone] In person visit   Subjective:   Lori [name] is a 83 y.o. year old very pleasant female patient who presents for/with See problem oriented charting Chief Complaint  Patient presents with   Hyperlipidemia    Wants to discuss atorvastatin , thinks she is having issues with it that's causing itching.    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    Priority: Medium    Aortic atherosclerosis (HCC) 02/11/2016    Priority: Medium    Hypertension 11/29/2013    Priority: Medium    Hyperlipidemia 11/29/2013    Priority: Medium    Barrett's esophagus 08/24/2013    Priority: Medium    Allergic rhinitis 03/09/2007    Priority: Medium    Osteoporosis 09/22/2006    Priority: Medium    IBS (irritable bowel syndrome) 06/26/2019    Priority: Low   Eczema 11/29/2013    Priority: Low   GERD (gastroesophageal reflux disease) 05/16/2013    Priority: Low   Vitamin D  deficiency 01/29/2012    Priority: Low   Solitary pulmonary nodule 06/08/2011    Priority: Low   HIATAL HERNIA WITH REFLUX 02/06/2010    Priority: Low   DEGENERATIVE JOINT DISEASE, KNEE 03/14/2008    Priority: Low   VARICOSE VEINS LOWER EXTREMITIES W/INFLAMMATION 03/09/2007    Priority: Low   ACTINIC KERATOSIS, FOREHEAD, LEFT 03/09/2007    Priority: Low   Headache(784.0) 03/09/2007    Priority: Low   MENIERE'S DISEASE 09/22/2006    Priority: Low   RAYNAUD'S DISEASE 09/22/2006    Priority: Low   Abnormal ear sensation, right  02/27/2021    Priority: 1.   Family history of kidney cancer 10/22/2020    Priority: 1.   Family history of thyroid  cancer 10/22/2020    Priority: 1.   Family history of multiple myeloma 10/22/2020    Priority: 1.   Family history of breast cancer 10/22/2020    Priority: 1.   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 90 tablet 3   aspirin 81 MG tablet Take 81 mg by mouth daily. Evening     augmented betamethasone dipropionate (DIPROLENE-AF) 0.05 % cream Apply topically as needed.     cholecalciferol (VITAMIN D3) 25 MCG (1000 UNIT) tablet Take 1,000 Units by mouth daily. Take 1 tablet daily     cyanocobalamin  1000 MCG tablet Take 1,000 mcg by mouth once a week.     letrozole  (FEMARA ) 2.5 MG tablet TAKE 1 TABLET BY MOUTH EVERY DAY 90 tablet 3   metoprolol  succinate (TOPROL -XL) 25 MG 24 hr tablet Take 1.5 tablets daily. 135 tablet 3  montelukast  (SINGULAIR ) 10 MG tablet TAKE 1 TABLET BY MOUTH AT BEDTIME 90 tablet 1   pantoprazole  (PROTONIX ) 40 MG tablet TAKE 1 TABLET BY MOUTH EVERY DAY 90 tablet 1   No current facility-administered medications for this visit.     Objective:  BP 134/61 Comment: most recent home reading  Pulse 89   Temp (!) 97.3 F (36.3 C)   Ht 5\' 7"  (1.702 m)   Wt 114 lb 9.6 oz (52 kg)   SpO2 97%   BMI 17.95 kg/m  Gen: NAD, resting comfortably CV: RRR no murmurs rubs or gallops Lungs: CTAB no crackles, wheeze, rhonchi Abdomen: soft/nontender even in suprapubic area/nondistended/normal bowel sounds. No rebound or guarding.  Ext: trace edema, multiple small macules and plaques with fine scale  on bilaterally lower extremities and reports notes in other areas  Skin: warm, dry     Assessment and Plan   #Recent urinary tract infection/cystitis - ceftin  was helpful but as has come off is having recurrence of symptoms- check urine culture- if still has enterococcus may have to send to hospital due to allergies to penicillin and nitrofurantoin - only other option would be IV vancomycin   #Chilled sensation- years of issues- is on thin side- also wonders if letrozole  could be causing it but she wants to maintain. TSH normal within 2 years. Not particularly worse with UTI Lab Results  Component Value Date   TSH 1.05 05/12/2022   # Hyperlipidemia/aortic atherosclerosis/statin myalgia #history of stroke on imaging- remote- on asa 81 mg  S:medication: 2025 trial atorvastatin  twice weekly- feels achier and has noted recurrence of diffue lichenoid dermatitis- steroid cream -Felt poorly on rosuvastatin  10 mg 3x a week or weekly but poor control and just once weekly, also felt poorly on atorvastatin  20 mg weekly  A/P: despite aortic atherosclerosis and history of stroke and ideally having her on statin has failed multiple medications now- I want her to stop the atorvastatin  and let me know if rash fails to improve or follow up with dermatology Dr. Theron Flavin- sounds like they are considering injectables for this though - with stroke history- maintain aspirin - may trial zetia in August as nonstatin- hoping fewer myalgias and hopefully doesn't intensify rash.   #hypertension S: medication: amlodipine  2.5 mg BP Readings from Last 3 Encounters:  07/25/23 134/61  07/12/23 (!) 141/68  04/21/23 (!) 140/64  A/P: blood pressure well controlled on home checks- continue current medications   Recommended follow up: Return for next already scheduled visit or sooner if needed. Future Appointments  Date Time Provider Department Center  08/23/2023  1:45 PM Cameron Cea, MD [facility] None  09/05/2023   1:15 PM McCaughan, Dia D, DPM [facility]  10/20/2023  2:00 PM [facility] ANNUAL WELLNESS VISIT 1 [facility]  10/27/2023  9:20 AM Zollie Ellery, Saverio Curling, MD [facility]    Lab/Order associations:   ICD-10-CM   1. Hyperlipidemia, unspecified hyperlipidemia type  E78.5     2. Primary hypertension  I10     3. 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   Return precautions advised.  Clarisa Crooked, MD

## 2023-07-25 NOTE — Patient Instructions (Addendum)
 despite aortic atherosclerosis and ideally having her on statin has failed multiple medications now- I want her to stop the atorvastatin  and let me know if rash fails to improve or follow up with dermatology     Please stop by lab before you go If you have mychart- we will send your results within 3 business days of us  receiving them.  If you do not have mychart- we will call you about results within 5 business days of us  receiving them.  *please also note that you will see labs on mychart as soon as they post. I will later go in and write notes on them- will say "notes from Dr. Arlene Ben"   Recommended follow up: Return for next already scheduled visit or sooner if needed.

## 2023-07-26 LAB — URINE CULTURE
MICRO NUMBER:: 16413072
SPECIMEN QUALITY:: ADEQUATE

## 2023-07-27 ENCOUNTER — Encounter: Payer: Self-pay | Admitting: Family Medicine

## 2023-08-11 ENCOUNTER — Other Ambulatory Visit: Payer: Self-pay | Admitting: Family Medicine

## 2023-08-16 ENCOUNTER — Other Ambulatory Visit: Payer: Self-pay | Admitting: Family Medicine

## 2023-08-17 ENCOUNTER — Other Ambulatory Visit: Payer: Self-pay

## 2023-08-17 ENCOUNTER — Encounter: Payer: Self-pay | Admitting: Family Medicine

## 2023-08-17 MED ORDER — ATORVASTATIN CALCIUM 20 MG PO TABS: 20.0000 mg | ORAL_TABLET | ORAL | 3 refills | Status: AC

## 2023-08-23 ENCOUNTER — Ambulatory Visit: Payer: Medicare Other | Admitting: Hematology and Oncology

## 2023-08-23 ENCOUNTER — Inpatient Hospital Stay: Payer: Medicare Other | Attending: Hematology and Oncology | Admitting: Hematology and Oncology

## 2023-08-23 VITALS — BP 140/78 | HR 72 | Temp 98.5°F | Resp 16 | Wt 116.1 lb

## 2023-08-23 DIAGNOSIS — C50311 Malignant neoplasm of lower-inner quadrant of right female breast: Secondary | ICD-10-CM | POA: Insufficient documentation

## 2023-08-23 DIAGNOSIS — Z170 Estrogen receptor positive status [ER+]: Secondary | ICD-10-CM | POA: Diagnosis not present

## 2023-08-23 DIAGNOSIS — K209 Esophagitis, unspecified without bleeding: Secondary | ICD-10-CM | POA: Insufficient documentation

## 2023-08-23 DIAGNOSIS — Z79811 Long term (current) use of aromatase inhibitors: Secondary | ICD-10-CM | POA: Insufficient documentation

## 2023-08-23 DIAGNOSIS — M8580 Other specified disorders of bone density and structure, unspecified site: Secondary | ICD-10-CM | POA: Diagnosis not present

## 2023-08-23 DIAGNOSIS — N951 Menopausal and female climacteric states: Secondary | ICD-10-CM | POA: Insufficient documentation

## 2023-08-23 DIAGNOSIS — R232 Flushing: Secondary | ICD-10-CM | POA: Insufficient documentation

## 2023-08-23 DIAGNOSIS — Z79899 Other long term (current) drug therapy: Secondary | ICD-10-CM | POA: Diagnosis not present

## 2023-08-23 DIAGNOSIS — Z7982 Long term (current) use of aspirin: Secondary | ICD-10-CM | POA: Diagnosis not present

## 2023-08-23 NOTE — Assessment & Plan Note (Signed)
 10/13/2020:Screening mammogram showed indeterminate mass in the right breast. Diagnostic mammogram and US  showed 1 cm x 1.3 cm suspicious irregular mass at 4:00 4 cm from the nipple. 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   11/05/2020:Right lumpectomy: Grade 2 IDC, 0.8 cm with DCIS, margins negative, ER 95%, PR 80%, HER2 negative, Ki-67 10% Based on good prognostic profile: Did not receive radiation   Osteopenia: T score -2.4: Previously received Prolia  but discontinued because of high co-pay's (not eligible for oral bisphosphonate therapy because of esophagitis).   Current treatment: Letrozole  Letrozole  toxicities: Hot flashes and cold chills  Breast cancer surveillance: 1.  Breast exam 08/23/2023: Benign 2. mammogram 11/16/2022: Benign, density cat A   Intermittent episodes of vertigo followed by dizziness that lasted up to 2 months: Unclear etiology Return to clinic in 1 year for follow-up

## 2023-08-23 NOTE — Progress Notes (Signed)
 Patient Care Team: Almira Jaeger, MD as PCP - General (Family Medicine) Odie Benne, MD as PCP - Cardiology (Cardiology) Mealor, Donnamae Gaba, MD as PCP - Electrophysiology (Cardiology) Virgina Grills, MD as Consulting Physician (Otolaryngology) Asencion Blacksmith, MD (Inactive) as Consulting Physician (Gastroenterology) Gaynelle Keeling, MD as Consulting Physician (Dermatology) Sim Dryer, MD as Consulting Physician (General Surgery) Cameron Cea, MD as Consulting Physician (Hematology and Oncology) Johna Myers, MD as Consulting Physician (Radiation Oncology) Myrle Aspen, [facility] (Inactive) as Pharmacist (Pharmacist)  DIAGNOSIS:  Encounter Diagnosis  Name Primary?   Malignant neoplasm of lower-inner quadrant of right breast of female, estrogen receptor positive (HCC) Yes    SUMMARY OF ONCOLOGIC HISTORY: Oncology History  Malignant neoplasm of lower-inner quadrant of right breast of female, estrogen receptor positive (HCC)  10/13/2020 Initial Diagnosis   Screening mammogram showed indeterminate mass in the right breast. Diagnostic mammogram and US  showed 1 cm x 1.3 cm suspicious irregular mass at 4:00 4 cm from the nipple. 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   10/22/2020 Cancer Staging   Staging form: Breast, AJCC 8th Edition - Clinical stage from 10/22/2020: Stage IA (cT1b, cN0, cM0, G2, ER+, PR+, HER2-) - Signed by Cameron Cea, MD on 10/22/2020 Stage prefix: Initial diagnosis Histologic grading system: 3 grade system   11/05/2020 Genetic Testing   Negative hereditary cancer genetic testing: no pathogenic variants detected in [facility] CancerNext-Expanded +RNAinsight Panel.  The report date is November 05, 2020.    The CancerNext-Expanded gene panel offered by [facility] and includes sequencing, rearrangement, and RNA analysis for the following 77 genes: AIP, ALK, APC, ATM, AXIN2, BAP1, BARD1, BLM, BMPR1A, BRCA1, BRCA2,  BRIP1, CDC73, CDH1, CDK4, CDKN1B, CDKN2A, CHEK2, CTNNA1, DICER1, FANCC, FH, FLCN, GALNT12, KIF1B, LZTR1, MAX, MEN1, MET, MLH1, MSH2, MSH3, MSH6, MUTYH, NBN, NF1, NF2, NTHL1, PALB2, PHOX2B, PMS2, POT1, PRKAR1A, PTCH1, PTEN, RAD51C, RAD51D, RB1, RECQL, RET, SDHA, SDHAF2, SDHB, SDHC, SDHD, SMAD4, SMARCA4, SMARCB1, SMARCE1, STK11, SUFU, TMEM127, TP53, TSC1, TSC2, VHL and XRCC2 (sequencing and deletion/duplication); EGFR, EGLN1, HOXB13, KIT, MITF, PDGFRA, POLD1, and POLE (sequencing only); EPCAM and GREM1 (deletion/duplication only).    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, margins negative, ER 95%, PR 80%, HER2 negative, Ki-67 10%   11/05/2020 Cancer Staging   Staging form: Breast, AJCC 8th Edition - Pathologic stage from 11/05/2020: Stage IA (pT1b, pN0, cM0, G2, ER+, PR+, HER2-) - Signed by Percival Brace, NP on 02/18/2021 Stage prefix: Initial diagnosis Histologic grading system: 3 grade system   11/2020 -  Anti-estrogen oral therapy   Letrozole  daily     CHIEF COMPLIANT: Palpable lump in the right breast surgical scar, follow-up on letrozole   HISTORY OF PRESENT ILLNESS:  History of Present Illness Lori [name] is an 83 year old female who presents with a lump in the right breast at the surgical scar site.  She identifies a lump at the three o'clock position of the surgical scar on her right breast. She is uncertain if this lump was present before. She adheres to her letrozole  regimen without missing doses. She experiences chills and low body temperature. She has had weight loss but is now regaining weight and maintains a good diet. A routine mammogram is scheduled for September, and she has had special mammograms for the past two years, scheduling them a year in advance.     ALLERGIES:  is allergic to azithromycin , bacitracin-polymyxin b, diltiazem , hydrocodone-acetaminophen, klonopin  [clonazepam ], neomycin, nitrofurantoin ,  penicillins,  pneumococcal vaccine polyvalent, pneumovax [pneumococcal polysaccharide vaccine], triamcinolone , bacitracin, hydrocodone, other, and pneumococcal vaccine.  MEDICATIONS:  Current Outpatient Medications  Medication Sig Dispense Refill   amLODipine  (NORVASC ) 2.5 MG tablet Take 1 tablet (2.5 mg total) by mouth daily. 90 tablet 3   aspirin 81 MG tablet Take 81 mg by mouth daily. Evening     atorvastatin  (LIPITOR) 20 MG tablet Take 1 tablet (20 mg total) by mouth 2 (two) times a week. 26 tablet 3   augmented betamethasone dipropionate (DIPROLENE-AF) 0.05 % cream Apply topically as needed.     cholecalciferol (VITAMIN D3) 25 MCG (1000 UNIT) tablet Take 1,000 Units by mouth daily. Take 1 tablet daily     cyanocobalamin  1000 MCG tablet Take 1,000 mcg by mouth once a week.     letrozole  (FEMARA ) 2.5 MG tablet TAKE 1 TABLET BY MOUTH EVERY DAY 90 tablet 3   metoprolol  succinate (TOPROL -XL) 25 MG 24 hr tablet Take 1.5 tablets daily. 135 tablet 3   montelukast  (SINGULAIR ) 10 MG tablet TAKE 1 TABLET BY MOUTH NIGHTLY AT BEDTIME 90 tablet 1   pantoprazole  (PROTONIX ) 40 MG tablet TAKE 1 TABLET BY MOUTH EVERY DAY 90 tablet 1   No current facility-administered medications for this visit.    PHYSICAL EXAMINATION: ECOG PERFORMANCE STATUS: 1 - Symptomatic but completely ambulatory  Vitals:   08/23/23 1335  BP: (!) 140/78  Pulse: 72  Resp: 16  Temp: 98.5 F (36.9 C)  SpO2: 98%   Filed Weights   08/23/23 1335  Weight: 116 lb 1.6 oz (52.7 kg)    Physical Exam BREAST: Palpable lump at surgical scar, right breast, 3 o'clock position.  (exam performed in the presence of a chaperone)  LABORATORY DATA:  I have reviewed the data as listed    Latest Ref Rng & Units 04/21/2023   11:00 AM 12/27/2022   11:50 AM 12/09/2022    2:01 PM  CMP  Glucose 70 - 99 mg/dL 85  696  82   BUN 6 - 23 mg/dL 20  15  18    Creatinine 0.40 - 1.20 mg/dL 2.95  2.84  1.32   Sodium 135 - 145 mEq/L 137  139  140   Potassium 3.5  - 5.1 mEq/L 3.9  3.8  3.7   Chloride 96 - 112 mEq/L 101  101  102   CO2 19 - 32 mEq/L 27  28  29    Calcium  8.4 - 10.5 mg/dL 9.6  44.0  9.9   Total Protein 6.0 - 8.3 g/dL 7.7   7.9   Total Bilirubin 0.2 - 1.2 mg/dL 0.4   0.4   Alkaline Phos 39 - 117 U/L 88   86   AST 0 - 37 U/L 18   17   ALT 0 - 35 U/L 10   12     Lab Results  Component Value Date   WBC 5.1 04/21/2023   HGB 12.7 04/21/2023   HCT 39.2 04/21/2023   MCV 87.7 04/21/2023   PLT 272.0 04/21/2023   NEUTROABS 2.7 04/21/2023    ASSESSMENT & PLAN:  Malignant neoplasm of lower-inner quadrant of right breast of female, estrogen receptor positive (HCC) 10/13/2020:Screening mammogram showed indeterminate mass in the right breast. Diagnostic mammogram and US  showed 1 cm x 1.3 cm suspicious irregular mass at 4:00 4 cm from the nipple. 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   Current treatment: Letrozole  Letrozole  toxicities: Hot flashes and cold chills  Breast cancer surveillance: 1.  Breast exam 08/23/2023: Benign 2. mammogram 11/16/2022: Benign, density cat A   Palpable lump in the right breast adjacent to the surgical scar at 3 o'clock position: Will obtain a mammogram and ultrasound for further evaluation it could be fat necrosis.  Return to clinic in 1 year for follow-up   No orders of the defined types were placed in this encounter.  The patient has a good understanding of the overall plan. she agrees with it. she will call with any problems that may develop before the next visit here. Total time spent: 30 mins including face to face time  and time spent for planning, charting and co-ordination of care   Viinay K Rohaan Durnil, MD 08/23/23

## 2023-08-24 DIAGNOSIS — H02055 Trichiasis without entropian left lower eyelid: Secondary | ICD-10-CM | POA: Diagnosis not present

## 2023-08-24 DIAGNOSIS — H0279 Other degenerative disorders of eyelid and periocular area: Secondary | ICD-10-CM | POA: Diagnosis not present

## 2023-08-24 DIAGNOSIS — H16212 Exposure keratoconjunctivitis, left eye: Secondary | ICD-10-CM | POA: Diagnosis not present

## 2023-08-26 ENCOUNTER — Encounter: Payer: Self-pay | Admitting: Family Medicine

## 2023-08-29 ENCOUNTER — Other Ambulatory Visit: Payer: Self-pay

## 2023-08-29 MED ORDER — METOPROLOL SUCCINATE ER 25 MG PO TB24: 25.0000 mg | ORAL_TABLET | Freq: Every day | ORAL | 3 refills | Status: AC

## 2023-09-05 ENCOUNTER — Ambulatory Visit: Admitting: Podiatry

## 2023-10-05 ENCOUNTER — Encounter: Payer: Self-pay | Admitting: Hematology and Oncology

## 2023-10-06 ENCOUNTER — Other Ambulatory Visit: Payer: Self-pay | Admitting: *Deleted

## 2023-10-06 DIAGNOSIS — C50311 Malignant neoplasm of lower-inner quadrant of right female breast: Secondary | ICD-10-CM

## 2023-10-10 DIAGNOSIS — N6312 Unspecified lump in the right breast, upper inner quadrant: Secondary | ICD-10-CM | POA: Diagnosis not present

## 2023-10-10 DIAGNOSIS — R92341 Mammographic extreme density, right breast: Secondary | ICD-10-CM | POA: Diagnosis not present

## 2023-10-10 DIAGNOSIS — N641 Fat necrosis of breast: Secondary | ICD-10-CM | POA: Diagnosis not present

## 2023-10-11 ENCOUNTER — Encounter: Payer: Self-pay | Admitting: Hematology and Oncology

## 2023-10-19 ENCOUNTER — Ambulatory Visit (INDEPENDENT_AMBULATORY_CARE_PROVIDER_SITE_OTHER)

## 2023-10-19 VITALS — Ht 67.0 in | Wt 116.0 lb

## 2023-10-19 DIAGNOSIS — Z0000 Encounter for general adult medical examination without abnormal findings: Secondary | ICD-10-CM | POA: Diagnosis not present

## 2023-10-19 NOTE — Patient Instructions (Addendum)
 Ms. Lori [name] , Thank you for taking time out of your busy schedule to complete your Annual Wellness Visit with me. I enjoyed our conversation and look forward to speaking with you again next year. I, as well as your care team,  appreciate your ongoing commitment to your health goals. Please review the following plan we discussed and let me know if I can assist you in the future. Your Game plan/ To Do List    Referrals: If you haven't heard from the office you've been referred to, please reach out to them at the phone provided.   Follow up Visits: Next Medicare AWV with our clinical staff: 10/24/24   Have you seen your provider in the last 6 months (3 months if uncontrolled diabetes)? Yes Next Office Visit with your provider: 10/27/23  Clinician Recommendations: Each day, aim for 6 glasses of water, plenty of protein in your diet and try to get up and walk/ stretch every hour for 5-10 minutes at a time.        This is a list of the screening recommended for you and due dates:  Health Maintenance  Topic Date Due   COVID-19 Vaccine (5 - 2024-25 season) 11/21/2022   Flu Shot  10/21/2023   Mammogram  10/09/2024   Medicare Annual Wellness Visit  10/18/2024   DTaP/Tdap/Td vaccine (4 - Td or Tdap) 04/27/2033   DEXA scan (bone density measurement)  Completed   Zoster (Shingles) Vaccine  Completed   Hepatitis B Vaccine  Aged Out   HPV Vaccine  Aged Out   Meningitis B Vaccine  Aged Out   Pneumococcal Vaccine for age over 61  Discontinued   Hepatitis C Screening  Discontinued    Advanced directives: (In Chart) A copy of your advanced directives are scanned into your chart should your provider ever need it. Advance Care Planning is important because it:  [x]  Makes sure you receive the medical care that is consistent with your values, goals, and preferences  [x]  It provides guidance to your family and loved ones and reduces their decisional burden about whether or not they are making the right  decisions based on your wishes.  Follow the link provided in your after visit summary or read over the paperwork we have mailed to you to help you started getting your Advance Directives in place. If you need assistance in completing these, please reach out to us  so that we can help you!  See attachments for Preventive Care and Fall Prevention Tips.

## 2023-10-19 NOTE — Progress Notes (Signed)
 Subjective:   Lori [name] is a 83 y.o. who presents for a Medicare Wellness preventive visit.  As a reminder, Annual Wellness Visits don't include a physical exam, and some assessments may be limited, especially if this visit is performed virtually. We may recommend an in-person follow-up visit with your provider if needed.  Visit Complete: Virtual I connected with  Kirstyn Marie Beverley on 10/19/23 by a audio enabled telemedicine application and verified that I am speaking with the correct person using two identifiers.  Patient Location: Home  Provider Location: Home Office  I discussed the limitations of evaluation and management by telemedicine. The patient expressed understanding and agreed to proceed.  Vital Signs: Because this visit was a virtual/telehealth visit, some criteria may be missing or patient reported. Any vitals not documented were not able to be obtained and vitals that have been documented are patient reported.  VideoDeclined- This patient declined Librarian, academic. Therefore the visit was completed with audio only.  Persons Participating in Visit: Patient.  AWV Questionnaire: No: Patient Medicare AWV questionnaire was not completed prior to this visit.  Cardiac Risk Factors include: advanced age (>71men, >64 women);dyslipidemia;hypertension     Objective:    Today's Vitals   10/19/23 1353 10/19/23 1354  Weight: 116 lb (52.6 kg)   Height: 5' 7 (1.702 m)   PainSc:  7    Body mass index is 18.17 kg/m.     10/19/2023    1:56 PM 12/27/2022   11:47 AM 10/14/2022    1:56 PM 01/11/2022    1:25 PM 11/17/2021    8:11 AM 10/08/2021    2:41 PM 07/10/2021    9:36 AM  Advanced Directives  Does Patient Have a Medical Advance Directive? Yes No Yes Yes Yes Yes Yes  Type of Estate agent of [place];Living will  Healthcare Power of [place];Living will Living will;Healthcare Power of [employer] Power of  [employer];Living will Healthcare Power of [employer] of [place];Living will  Does patient want to make changes to medical advance directive? No - Patient declined  No - Patient declined  No - Patient declined  No - Patient declined  Copy of Healthcare Power of Attorney in Chart? Yes - validated most recent copy scanned in chart (See row information)  Yes - validated most recent copy scanned in chart (See row information)  No - copy requested Yes - validated most recent copy scanned in chart (See row information) No - copy requested    Current Medications (verified) Outpatient Encounter Medications as of 10/19/2023  Medication Sig   amLODipine  (NORVASC ) 2.5 MG tablet Take 1 tablet (2.5 mg total) by mouth daily.   aspirin 81 MG tablet Take 81 mg by mouth daily. Evening   atorvastatin  (LIPITOR) 20 MG tablet Take 1 tablet (20 mg total) by mouth 2 (two) times a week.   augmented betamethasone dipropionate (DIPROLENE-AF) 0.05 % cream Apply topically as needed.   BOOSTRIX 5-2.5-18.5 LF-MCG/0.5 injection    cholecalciferol (VITAMIN D3) 25 MCG (1000 UNIT) tablet Take 1,000 Units by mouth daily. Take 1 tablet daily   cyanocobalamin  1000 MCG tablet Take 1,000 mcg by mouth once a week.   letrozole  (FEMARA ) 2.5 MG tablet TAKE 1 TABLET BY MOUTH EVERY DAY   metoprolol  succinate (TOPROL -XL) 25 MG 24 hr tablet Take 1 tablet (25 mg total) by mouth daily.   montelukast  (SINGULAIR ) 10 MG tablet TAKE 1 TABLET BY MOUTH NIGHTLY AT BEDTIME   pantoprazole  (PROTONIX ) 40  MG tablet TAKE 1 TABLET BY MOUTH EVERY DAY   No facility-administered encounter medications on file as of 10/19/2023.    Allergies (verified) Azithromycin , Bacitracin-polymyxin b, Diltiazem , Hydrocodone-acetaminophen, Klonopin  [clonazepam ], Neomycin, Nitrofurantoin , Penicillins, Pneumococcal vaccine polyvalent, Pneumovax [pneumococcal polysaccharide vaccine], Triamcinolone , Bacitracin, Hydrocodone, Other, and Pneumococcal vaccine    History: 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   Anxiety    Aortic atherosclerosis (HCC)    Arthritis    knee- DJD    Barrett's esophagus    Breast cancer (HCC)    right breast IDC   Burning mouth syndrome    Dr Carlie 11-2016 - no smell or taste x 4 yrs per pt    Cataract    bilateral    Clotting disorder (HCC) 1988   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    Eczema    Family history of breast cancer    Family history of kidney cancer    Family history of multiple myeloma    Family history of thyroid  cancer    GERD (gastroesophageal reflux disease)    Headache(784.0)    History of IBS    History of kidney stones    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    Hyperlipidemia    on medication   Hypertension    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   Low back pain    Meniere disease    Neuromuscular disorder (HCC)    raynaud's   Osteopenia    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HCC)    Stroke (HCC) 1988   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 carotid artery dissection    Tubular adenoma of colon 02/2013   Varicose veins with inflammation    upper and lower per pt    Vasculitis [facility])    Past Surgical History:  Procedure Laterality Date   arthroscopic knee  2009   left knee/ torn meniscus   BREAST LUMPECTOMY WITH RADIOACTIVE SEED LOCALIZATION Right 11/05/2020   Procedure: RIGHT BREAST LUMPECTOMY WITH RADIOACTIVE SEED LOCALIZATION;  Surgeon: Vanderbilt Ned, MD;  Location: [facility];  Service: General;  Laterality: Right;   BUNIONECTOMY Right 1998   with other foot surgery    carotid artery disection  1988   Carotid Artery Dissection   CATARACT EXTRACTION, BILATERAL  07-18-2017,08-08-2017   CESAREAN SECTION  1988   1 time   COLONOSCOPY  2019   last 2019   DILATION AND CURETTAGE OF UTERUS  2004   EYE SURGERY     EYE SURGERY  09/25/2021   lowere lid of left eye    POLYPECTOMY     POPLITEAL SYNOVIAL CYST EXCISION     left leg   TONSILLECTOMY  1957   UPPER GASTROINTESTINAL ENDOSCOPY     last 2018   Family History  Problem Relation Age of Onset   Multiple myeloma Father 39   Thyroid  cancer Sister 64       s/p removal. papilary and anaplastic.    Lung cancer Sister    Kidney disease Brother        cancer- removed   Kidney cancer Brother        dx early 71s   Alzheimer's disease Brother    Stroke Brother 24   Breast cancer Cousin 24       paternal first cousin   Cancer Cousin  unknown type, paternal first cousin   Breast cancer Other        mother's first cousin   Cervical cancer Other    Colon cancer Neg Hx    Esophageal cancer Neg Hx    Rectal cancer Neg Hx    Stomach cancer Neg Hx    Colon polyps Neg Hx    Social History   Socioeconomic History   Marital status: Married    Spouse name: Not on file   Number of children: 4   Years of education: Not on file   Highest education level: Not on file  Occupational History   Occupation: retired  Tobacco Use   Smoking status: Never   Smokeless tobacco: Never  Vaping Use   Vaping status: Never Used  Substance and Sexual Activity   Alcohol use: No    Alcohol/week: 0.0 standard drinks of alcohol   Drug use: No   Sexual activity: Yes    Birth control/protection: Post-menopausal  Other Topics Concern   Not on file  Social History Narrative   Lives with husband who is also a patient of Dr. Katrinka. [place]ie [address] 51, Donnice BREEDING (sees Dr. Katrinka), Lauraine 34. 3 grandkids in 2022   Social Drivers of Health   Financial Resource Strain: Low Risk  (10/19/2023)   Overall Financial Resource Strain (CARDIA)    Difficulty of Paying Living Expenses: Not hard at all  Food Insecurity: No Food Insecurity (10/19/2023)   Hunger Vital Sign    Worried About Running Out of Food in the Last Year: Never true    Ran Out of Food in the Last Year: Never true  Transportation Needs: No Transportation  Needs (10/19/2023)   PRAPARE - Administrator, Civil Service (Medical): No    Lack of Transportation (Non-Medical): No  Physical Activity: Inactive (10/19/2023)   Exercise Vital Sign    Days of Exercise per Week: 0 days    Minutes of Exercise per Session: 0 min  Stress: No Stress Concern Present (10/19/2023)   [employer] of Occupational Health - Occupational Stress Questionnaire    Feeling of Stress: Not at all  Social Connections: Moderately Integrated (10/19/2023)   Social Connection and Isolation Panel    Frequency of Communication with Friends and Family: More than three times a week    Frequency of Social Gatherings with Friends and Family: More than three times a week    Attends Religious Services: More than 4 times per year    Active Member of [employer] or Organizations: No    Attends Engineer, structural: Never    Marital Status: Married    Tobacco Counseling Counseling given: Not Answered    Clinical Intake:  Pre-visit preparation completed: Yes  Pain : 0-10 Pain Score: 7  Pain Type: Chronic pain Pain Location: Generalized Pain Descriptors / Indicators: Aching Pain Onset: More than a month ago Pain Frequency: Intermittent     BMI - recorded: 18.17 Nutritional Status: BMI <19  Underweight Nutritional Risks: None Diabetes: No  Lab Results  Component Value Date   HGBA1C 6.1 04/21/2023   HGBA1C 6.2 01/06/2022   HGBA1C 6.2 05/20/2021     How often do you need to have someone help you when you read instructions, pamphlets, or other written materials from your doctor or pharmacy?: 1 - Never  Interpreter Needed?: No  Information entered by :: Ellouise Haws, LPN   Activities of Daily Living     10/19/2023    1:57  PM  In your present state of health, do you have any difficulty performing the following activities:  Hearing? 0  Vision? 0  Difficulty concentrating or making decisions? 0  Walking or climbing stairs? 0  Dressing or  bathing? 0  Doing errands, shopping? 0  Preparing Food and eating ? N  Using the Toilet? N  In the past six months, have you accidently leaked urine? N  Do you have problems with loss of bowel control? N  Managing your Medications? N  Managing your Finances? N  Housekeeping or managing your Housekeeping? N    Patient Care Team: Katrinka Garnette KIDD, MD as PCP - General (Family Medicine) Verlin Lonni BIRCH, MD as PCP - Cardiology (Cardiology) Mealor, Eulas BRAVO, MD as PCP - Electrophysiology (Cardiology) Carlie Clark, MD as Consulting Physician (Otolaryngology) Aneita Gwendlyn DASEN, MD (Inactive) as Consulting Physician (Gastroenterology) Lynnell Nottingham, MD as Consulting Physician (Dermatology) Vanderbilt Ned, MD as Consulting Physician (General Surgery) Odean Potts, MD as Consulting Physician (Hematology and Oncology) Dewey Rush, MD as Consulting Physician (Radiation Oncology) Nicholaus Sherlean CROME, [facility] (Inactive) as Pharmacist (Pharmacist)  I have updated your Care Teams any recent Medical Services you may have received from other providers in the past year.     Assessment:   This is a routine wellness examination for Khalea.  Hearing/Vision screen Hearing Screening - Comments:: Pt denies any hearing issues  Vision Screening - Comments:: Wears rx glasses - up to date with routine eye exams with Dr glade Pan    Goals Addressed             This Visit's Progress    Patient Stated       Get in better health        Depression Screen     10/19/2023    2:00 PM 07/12/2023    1:01 PM 12/09/2022   12:39 PM 10/14/2022    1:54 PM 09/16/2022    1:12 PM 04/23/2022    9:12 AM 10/22/2021   11:39 AM  PHQ 2/9 Scores  PHQ - 2 Score 0 0 0 0 0 0 0  PHQ- 9 Score   0   3 1    Fall Risk     10/19/2023    1:59 PM 07/12/2023    1:01 PM 12/09/2022   12:38 PM 10/14/2022    1:58 PM 09/16/2022    1:13 PM  Fall Risk   Falls in the past year? 0 0 0 0 0  Number falls in past yr: 0 0 0 0 0   Injury with Fall? 0 0 0 0 0  Risk for fall due to : Impaired balance/gait;Impaired mobility No Fall Risks Impaired balance/gait Impaired balance/gait;Impaired vision No Fall Risks  Follow up Falls prevention discussed Falls evaluation completed Falls evaluation completed Falls prevention discussed Falls evaluation completed    MEDICARE RISK AT HOME:  Medicare Risk at Home Any stairs in or around the home?: No If so, are there any without handrails?: No Home free of loose throw rugs in walkways, pet beds, electrical cords, etc?: Yes Adequate lighting in your home to reduce risk of falls?: Yes Life alert?: No Use of a cane, walker or w/c?: Yes Grab bars in the bathroom?: No Shower chair or bench in shower?: Yes Elevated toilet seat or a handicapped toilet?: Yes  TIMED UP AND GO:  Was the test performed?  No  Cognitive Function: 6CIT completed        10/19/2023    2:01  PM 10/14/2022    1:59 PM 10/08/2021    2:43 PM 09/25/2020    2:47 PM 12/01/2018    1:47 PM  6CIT Screen  What Year? 0 points 0 points 0 points 0 points 0 points  What month? 0 points 0 points 0 points 0 points 0 points  What time? 0 points 0 points 0 points 0 points 0 points  Count back from 20 0 points 0 points 0 points 0 points 0 points  Months in reverse 0 points 0 points 0 points 0 points 0 points  Repeat phrase 0 points 0 points 0 points 2 points 0 points  Total Score 0 points 0 points 0 points 2 points 0 points    Immunizations Immunization History  Administered Date(s) Administered   Fluad Quad(high Dose 65+) 12/01/2018, 12/27/2019, 12/01/2020, 01/06/2022   Fluad Trivalent(High Dose 65+) 12/29/2022   Influenza Split 01/04/2012   Influenza Whole 01/03/2007, 12/08/2007, 12/17/2008, 01/20/2010   Influenza, High Dose Seasonal PF 01/10/2015, 12/12/2015, 12/29/2016, 12/23/2017   Influenza,inj,Quad PF,6+ Mos 01/05/2013, 12/24/2013, 12/29/2022   PFIZER(Purple Top)SARS-COV-2 Vaccination 04/30/2019, 05/23/2019,  02/05/2020   Pfizer Covid-19 Vaccine Bivalent Booster 81yrs & up 02/03/2021   Pneumococcal Polysaccharide-23 02/12/2004   Td 03/23/2003, 04/04/2013   Tdap 04/28/2023   Zoster Recombinant(Shingrix) 11/13/2021, 01/14/2022    Screening Tests Health Maintenance  Topic Date Due   COVID-19 Vaccine (5 - 2024-25 season) 11/21/2022   INFLUENZA VACCINE  10/21/2023   MAMMOGRAM  10/09/2024   Medicare Annual Wellness (AWV)  10/18/2024   DTaP/Tdap/Td (4 - Td or Tdap) 04/27/2033   DEXA SCAN  Completed   Zoster Vaccines- Shingrix  Completed   Hepatitis B Vaccines  Aged Out   HPV VACCINES  Aged Out   Meningococcal B Vaccine  Aged Out   Pneumococcal Vaccine: 50+ Years  Discontinued   Hepatitis C Screening  Discontinued    Health Maintenance  Health Maintenance Due  Topic Date Due   COVID-19 Vaccine (5 - 2024-25 season) 11/21/2022   Health Maintenance Items Addressed: See Nurse Notes at the end of this note  Additional Screening:  Vision Screening: Recommended annual ophthalmology exams for early detection of glaucoma and other disorders of the eye. Would you like a referral to an eye doctor? No    Dental Screening: Recommended annual dental exams for proper oral hygiene  Community Resource Referral / Chronic Care Management: CRR required this visit?  No   CCM required this visit?  No   Plan:    I have personally reviewed and noted the following in the patient's chart:   Medical and social history Use of alcohol, tobacco or illicit drugs  Current medications and supplements including opioid prescriptions. Patient is not currently taking opioid prescriptions. Functional ability and status Nutritional status Physical activity Advanced directives List of other physicians Hospitalizations, surgeries, and ER visits in previous 12 months Vitals Screenings to include cognitive, depression, and falls Referrals and appointments  In addition, I have reviewed and discussed with  patient certain preventive protocols, quality metrics, and best practice recommendations. A written personalized care plan for preventive services as well as general preventive health recommendations were provided to patient.   Ellouise VEAR Haws, LPN   2/69/7974   After Visit Summary: (MyChart) Due to this being a telephonic visit, the after visit summary with patients personalized plan was offered to patient via MyChart   Notes: Nothing significant to report at this time.

## 2023-10-27 ENCOUNTER — Ambulatory Visit (INDEPENDENT_AMBULATORY_CARE_PROVIDER_SITE_OTHER): Payer: Medicare Other | Admitting: Family Medicine

## 2023-10-27 ENCOUNTER — Encounter: Payer: Self-pay | Admitting: Family Medicine

## 2023-10-27 ENCOUNTER — Ambulatory Visit: Payer: Self-pay | Admitting: Family Medicine

## 2023-10-27 VITALS — BP 110/60 | HR 58 | Temp 97.8°F | Ht 67.0 in | Wt 110.4 lb

## 2023-10-27 DIAGNOSIS — E785 Hyperlipidemia, unspecified: Secondary | ICD-10-CM | POA: Diagnosis not present

## 2023-10-27 DIAGNOSIS — R739 Hyperglycemia, unspecified: Secondary | ICD-10-CM | POA: Diagnosis not present

## 2023-10-27 DIAGNOSIS — Z131 Encounter for screening for diabetes mellitus: Secondary | ICD-10-CM

## 2023-10-27 DIAGNOSIS — Z8639 Personal history of other endocrine, nutritional and metabolic disease: Secondary | ICD-10-CM | POA: Diagnosis not present

## 2023-10-27 DIAGNOSIS — I10 Essential (primary) hypertension: Secondary | ICD-10-CM | POA: Diagnosis not present

## 2023-10-27 LAB — CBC WITH DIFFERENTIAL/PLATELET
Basophils Absolute: 0 K/uL (ref 0.0–0.1)
Basophils Relative: 0.3 % (ref 0.0–3.0)
Eosinophils Absolute: 0.2 K/uL (ref 0.0–0.7)
Eosinophils Relative: 3 % (ref 0.0–5.0)
HCT: 37.5 % (ref 36.0–46.0)
Hemoglobin: 12.3 g/dL (ref 12.0–15.0)
Lymphocytes Relative: 32.9 % (ref 12.0–46.0)
Lymphs Abs: 1.7 K/uL (ref 0.7–4.0)
MCHC: 32.7 g/dL (ref 30.0–36.0)
MCV: 86.9 fl (ref 78.0–100.0)
Monocytes Absolute: 0.6 K/uL (ref 0.1–1.0)
Monocytes Relative: 12.2 % — ABNORMAL HIGH (ref 3.0–12.0)
Neutro Abs: 2.7 K/uL (ref 1.4–7.7)
Neutrophils Relative %: 51.6 % (ref 43.0–77.0)
Platelets: 266 K/uL (ref 150.0–400.0)
RBC: 4.31 Mil/uL (ref 3.87–5.11)
RDW: 14.2 % (ref 11.5–15.5)
WBC: 5.3 K/uL (ref 4.0–10.5)

## 2023-10-27 LAB — HEMOGLOBIN A1C: Hgb A1c MFr Bld: 6.3 % (ref 4.6–6.5)

## 2023-10-27 LAB — COMPREHENSIVE METABOLIC PANEL WITH GFR
ALT: 16 U/L (ref 0–35)
AST: 22 U/L (ref 0–37)
Albumin: 4.2 g/dL (ref 3.5–5.2)
Alkaline Phosphatase: 84 U/L (ref 39–117)
BUN: 23 mg/dL (ref 6–23)
CO2: 23 meq/L (ref 19–32)
Calcium: 9.5 mg/dL (ref 8.4–10.5)
Chloride: 101 meq/L (ref 96–112)
Creatinine, Ser: 0.61 mg/dL (ref 0.40–1.20)
GFR: 83.72 mL/min (ref >60.00)
Glucose, Bld: 84 mg/dL (ref 70–99)
Potassium: 3.9 meq/L (ref 3.5–5.1)
Sodium: 139 meq/L (ref 135–145)
Total Bilirubin: 0.4 mg/dL (ref 0.2–1.2)
Total Protein: 7.8 g/dL (ref 6.0–8.3)

## 2023-10-27 LAB — LIPID PANEL
Cholesterol: 169 mg/dL (ref 0–200)
HDL: 82.4 mg/dL (ref >39.00)
LDL Cholesterol: 77 mg/dL (ref 0–99)
NonHDL: 86.53
Total CHOL/HDL Ratio: 2
Triglycerides: 48 mg/dL (ref 0.0–149.0)
VLDL: 9.6 mg/dL (ref 0.0–40.0)

## 2023-10-27 LAB — IBC + FERRITIN
Ferritin: 24.8 ng/mL (ref 10.0–291.0)
Iron: 60 ug/dL (ref 42–145)
Saturation Ratios: 15.6 % — ABNORMAL LOW (ref 20.0–50.0)
TIBC: 383.6 ug/dL (ref 250.0–450.0)
Transferrin: 274 mg/dL (ref 212.0–360.0)

## 2023-10-27 NOTE — Patient Instructions (Addendum)
 Encouraged follow up with gastroenterology to repeat endoscopy especially with recent reflux flare  Would encourage some weight gain  Please stop by lab before you go If you have mychart- we will send your results within 3 business days of us  receiving them.  If you do not have mychart- we will call you about results within 5 business days of us  receiving them.  *please also note that you will see labs on mychart as soon as they post. I will later go in and write notes on them- will say notes from Dr. Katrinka   Recommended follow up: Return in about 6 months (around 04/28/2024) for physical or sooner if needed.Schedule b4 you leave.

## 2023-10-27 NOTE — Progress Notes (Signed)
 Phone [phone] In person visit   Subjective:   Lori [name] is a 83 y.o. 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    Priority: Medium    Aortic atherosclerosis (HCC) 02/11/2016    Priority: Medium    Hypertension 11/29/2013    Priority: Medium    Hyperlipidemia 11/29/2013    Priority: Medium    Barrett's esophagus 08/24/2013    Priority: Medium    Allergic rhinitis 03/09/2007    Priority: Medium    Osteoporosis 09/22/2006    Priority: Medium    IBS (irritable bowel syndrome) 06/26/2019    Priority: Low   Eczema 11/29/2013    Priority: Low   GERD (gastroesophageal reflux disease) 05/16/2013    Priority: Low   Vitamin D  deficiency 01/29/2012    Priority: Low   Solitary pulmonary nodule 06/08/2011    Priority: Low   HIATAL HERNIA WITH REFLUX 02/06/2010    Priority: Low   DEGENERATIVE JOINT DISEASE, KNEE 03/14/2008    Priority: Low   VARICOSE VEINS LOWER EXTREMITIES W/INFLAMMATION 03/09/2007    Priority: Low   ACTINIC KERATOSIS, FOREHEAD, LEFT 03/09/2007    Priority: Low   Headache(784.0) 03/09/2007    Priority: Low   MENIERE'S DISEASE 09/22/2006    Priority: Low   RAYNAUD'S DISEASE 09/22/2006    Priority: Low   Abnormal ear sensation, right 02/27/2021    Priority: 1.   Family history of kidney cancer 10/22/2020     Priority: 1.   Family history of thyroid  cancer 10/22/2020    Priority: 1.   Family history of multiple myeloma 10/22/2020    Priority: 1.   Family history of breast cancer 10/22/2020    Priority: 1.   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 90 tablet 3   aspirin 81 MG tablet Take 81 mg by mouth daily. Evening     atorvastatin  (LIPITOR) 20 MG tablet Take 1 tablet (20 mg total) by mouth 2 (two) times a week. 26 tablet 3   augmented betamethasone dipropionate (DIPROLENE-AF) 0.05 % cream Apply topically as needed.     cholecalciferol (VITAMIN D3) 25 MCG (1000 UNIT) tablet Take 1,000 Units by mouth daily. Take 1 tablet daily     cyanocobalamin  1000 MCG tablet Take 1,000 mcg by mouth once a week.     letrozole  (FEMARA ) 2.5 MG tablet TAKE 1 TABLET BY MOUTH EVERY DAY 90 tablet 3   metoprolol  succinate (TOPROL -XL) 25 MG 24 hr  tablet Take 1 tablet (25 mg total) by mouth daily. 90 tablet 3   montelukast  (SINGULAIR ) 10 MG tablet TAKE 1 TABLET BY MOUTH NIGHTLY AT BEDTIME 90 tablet 1   pantoprazole  (PROTONIX ) 40 MG tablet TAKE 1 TABLET BY MOUTH EVERY DAY 90 tablet 1   No current facility-administered medications for this visit.     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  Patient with right lumpectomy 11/05/2020 with negative margins thankfully.  As a result she does not require adjuvant radiation.  She will be on letrozole  daily for 5 years.  She is following with Dr. Gudena and keeps close follow up - last visit 08/23/23  # Hypertension S: Medication: Amlodipine  2.5 mg daily, metoprolol  25 mg extended release - Previously on amlodipine  up to 5 mg, previously metoprolol  caused bradycardia but tolerating 2025 - diltiazem  for PVC caused rash in past BP Readings from Last 3 Encounters:  10/27/23 110/60  08/23/23 (!) 140/78  07/25/23 134/61  A/P:   well controlled continue current medications  - no recent PVCs  # GERD/barrett's esophagus not noted 10/2019 Dr. Aneita- q3 year endoscopy S:Compliant with protonix  40mg  daily- using [employer] coffee and manuka honey starting late 2019 has been very helpful -had one episode in July and was tough. Had separate feeling of feeling run down/tired in bouts. Has needed iron  in past. Eyes can feel tired with this- wants to update bloodwork -adjusted diet and has been helpful A/P: GERD reasonable control after recent flares -continue current medications   Encouraged follow up with gastroenterology to repeat endoscopy especially with recent reflux flare -did encourage slight weight gain  # Hyperlipidemia/aortic atherosclerosis/statin myalgia #history of stroke on imaging- remote- on asa 81 mg  S:medication: atorvastatin  Wednesday and Sunday night. Seems to be tolerating slightly better this time- lichenoid dermatitis tolerates. Seems to tolerate better than rosuvastatin  Lab Results  Component Value Date   CHOL 224 (H) 04/21/2023   HDL 89.70 04/21/2023   LDLCALC 123 (H) 04/21/2023   LDLDIRECT 74.0 02/06/2016   TRIG 53.0 04/21/2023   CHOLHDL 2 04/21/2023  A/P:  hopefully improved- update lipid panel today   -could consider Zetia- but continue current  medications for now - w might try once a week plus zetia  #osteoarthritis- knees bilaterally - bothering her- wants to monitor  # Hyperglycemia/insulin resistance/prediabetes S:  Medication: none Exercise and diet- exercising and eating cleaner Lab Results  Component Value Date   HGBA1C 6.1 04/21/2023   HGBA1C 6.2 01/06/2022   HGBA1C 6.2 05/20/2021  A/P: hopefully stable- update a1c today. Continue without meds for now  - mentioned statins increase risk   #Allergic rhinitis S: Singulair  works extremely well for her  A/P: may trial claritin going into the fall to be proactive reasonable  Recommended follow up: Return in about 6 months (around 04/28/2024) for physical or sooner if needed.Schedule b4 you leave. Future Appointments  Date Time Provider Department Center  08/27/2024  1:45 PM Odean Potts, MD [facility] None  10/24/2024  1:40 PM [facility] ANNUAL WELLNESS VISIT 1 [facility]    Lab/Order associations: fasting   ICD-10-CM   1. Primary hypertension  I10     2. Hyperlipidemia, unspecified hyperlipidemia type  E78.5 Comprehensive metabolic panel with GFR    CBC with Differential/Platelet    Lipid panel    3. History of iron  deficiency  Z86.39 IBC + Ferritin    4. Screening for diabetes mellitus  Z13.1 HgB A1c    5. Hyperglycemia  R73.9 HgB A1c      No orders of the defined types were placed in this encounter.   Return precautions advised.  Garnette Lukes, MD

## 2023-11-07 ENCOUNTER — Encounter: Payer: Self-pay | Admitting: Family Medicine

## 2023-11-07 ENCOUNTER — Ambulatory Visit (INDEPENDENT_AMBULATORY_CARE_PROVIDER_SITE_OTHER): Admitting: Family Medicine

## 2023-11-07 VITALS — BP 102/70 | HR 91 | Temp 98.0°F | Ht 67.0 in | Wt 113.2 lb

## 2023-11-07 DIAGNOSIS — R300 Dysuria: Secondary | ICD-10-CM

## 2023-11-07 DIAGNOSIS — I10 Essential (primary) hypertension: Secondary | ICD-10-CM | POA: Diagnosis not present

## 2023-11-07 LAB — POCT URINALYSIS DIP (CLINITEK)
Bilirubin, UA: NEGATIVE
Glucose, UA: NEGATIVE mg/dL
Ketones, POC UA: NEGATIVE mg/dL
Nitrite, UA: NEGATIVE
POC PROTEIN,UA: NEGATIVE
Spec Grav, UA: <=1.005 — AB (ref 1.010–1.025)
Urobilinogen, UA: 0.2 U/dL
pH, UA: 7.5 (ref 5.0–8.0)

## 2023-11-07 MED ORDER — SULFAMETHOXAZOLE-TRIMETHOPRIM 800-160 MG PO TABS: 1.0000 | ORAL_TABLET | Freq: Two times a day (BID) | ORAL | 0 refills | Status: AC

## 2023-11-07 NOTE — Progress Notes (Signed)
 Phone [phone] In person visit   Subjective:   Lori [name] is a 83 y.o. year old very pleasant female patient who presents for/with See problem oriented charting Chief Complaint  Patient presents with   Urinary Tract Infection    Burning and urgency started yesterday;    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    Priority: Medium    Aortic atherosclerosis (HCC) 02/11/2016    Priority: Medium    Hypertension 11/29/2013    Priority: Medium    Hyperlipidemia 11/29/2013    Priority: Medium    Barrett's esophagus 08/24/2013    Priority: Medium    Allergic rhinitis 03/09/2007    Priority: Medium    Osteoporosis 09/22/2006    Priority: Medium    IBS (irritable bowel syndrome) 06/26/2019    Priority: Low   Eczema 11/29/2013    Priority: Low   GERD (gastroesophageal reflux disease) 05/16/2013    Priority: Low   Vitamin D  deficiency 01/29/2012    Priority: Low   Solitary pulmonary nodule 06/08/2011    Priority: Low   HIATAL HERNIA WITH REFLUX 02/06/2010    Priority: Low   DEGENERATIVE JOINT DISEASE, KNEE 03/14/2008    Priority: Low   VARICOSE VEINS LOWER EXTREMITIES W/INFLAMMATION 03/09/2007    Priority: Low   ACTINIC KERATOSIS, FOREHEAD, LEFT 03/09/2007    Priority: Low   Headache(784.0) 03/09/2007    Priority: Low   MENIERE'S DISEASE 09/22/2006    Priority: Low   RAYNAUD'S DISEASE 09/22/2006    Priority: Low   Abnormal ear sensation, right 02/27/2021    Priority: 1.   Family history of  kidney cancer 10/22/2020    Priority: 1.   Family history of thyroid  cancer 10/22/2020    Priority: 1.   Family history of multiple myeloma 10/22/2020    Priority: 1.   Family history of breast cancer 10/22/2020    Priority: 1.   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 90 tablet 3   aspirin 81 MG tablet Take 81 mg by mouth daily. Evening     atorvastatin  (LIPITOR) 20 MG tablet Take 1 tablet (20 mg total) by mouth 2 (two) times a week. 26 tablet 3   augmented betamethasone dipropionate (DIPROLENE-AF) 0.05 % cream Apply topically as needed.     cholecalciferol (VITAMIN D3) 25 MCG (1000 UNIT) tablet Take 1,000 Units by mouth daily. Take 1 tablet daily     cyanocobalamin  1000 MCG tablet Take 1,000 mcg by mouth once a week.     letrozole  (FEMARA ) 2.5 MG tablet TAKE 1 TABLET BY MOUTH EVERY DAY 90 tablet 3   metoprolol   succinate (TOPROL -XL) 25 MG 24 hr tablet Take 1 tablet (25 mg total) by mouth daily. 90 tablet 3   montelukast  (SINGULAIR ) 10 MG tablet TAKE 1 TABLET BY MOUTH NIGHTLY AT BEDTIME 90 tablet 1   pantoprazole  (PROTONIX ) 40 MG tablet TAKE 1 TABLET BY MOUTH EVERY DAY 90 tablet 1   sulfamethoxazole -trimethoprim  (BACTRIM  DS) 800-160 MG tablet Take 1 tablet by mouth 2 (two) times daily for 7 days. 14 tablet 0   No current facility-administered medications for this visit.     Objective:  BP 102/70 (BP Location: Right Arm, Patient Position: Sitting, Cuff Size: Normal)   Pulse 91   Temp 98 F (36.7 C) (Temporal)   Ht 5' 7 (1.702 m)   Wt 113 lb 3.2 oz (51.3 kg)   SpO2 96%   BMI 17.73 kg/m  Gen: NAD, resting  comfortably CV: RRR no murmurs rubs or gallops Lungs: CTAB no crackles, wheeze, rhonchi Abdomen: soft/nontender/nondistended/normal bowel sounds. No rebound or guarding.  No CVA tenderness Ext: no edema Skin: warm, dry  Results for orders placed or performed in visit on 11/07/23 (from the past 24 hours)  POCT URINALYSIS DIP (CLINITEK)     Status: Abnormal   Collection Time: 11/07/23 11:19 AM  Result Value Ref Range   Color, UA light yellow (A) yellow   Clarity, UA clear clear   Glucose, UA negative negative mg/dL   Bilirubin, UA negative negative   Ketones, POC UA negative negative mg/dL   Spec Grav, UA <=8.994 (A) 1.010 - 1.025   Blood, UA trace-lysed (A) negative   pH, UA 7.5 5.0 - 8.0   POC PROTEIN,UA negative negative, trace   Urobilinogen, UA 0.2 0.2 or 1.0 E.U./dL   Nitrite, UA Negative Negative   Leukocytes, UA Large (3+) (A) Negative       Assessment and Plan     #Concern for UTI S: Patients symptoms started yesterday.  Complains of dysuria: yes and intense burning; polyuria: YES ; nocturia: 2-3 x per baseline and about the same; urgency: yes.  Symptoms are slightly better.  -tried to drink more water and eat cranberries ROS- no fever, chills, nausea, vomiting, flank pain. No blood in urine.   A/P: UA concerning for UTI. Will get culture. Empiric treatment with: bactrim  for 7 days Patient to follow up if new or worsening symptoms or failure to improve.   # Hypertension S: Medication: Amlodipine  2.5 mg daily, metoprolol  25 mg extended release BP Readings from Last 3 Encounters:  11/07/23 102/70  10/27/23 110/60  08/23/23 (!) 140/78  A/P: Blood pressure well-controlled-continue current medication.  If persistently Vaslow may consider reducing dose  Recommended follow up: Return for as needed for new, worsening, persistent symptoms. Future Appointments  Date Time Provider Department Center  05/01/2024 10:00 AM Katrinka Garnette KIDD, MD [facility]  08/27/2024  1:45  PM Odean Potts, MD [facility] None  10/24/2024  1:40 PM [facility] ANNUAL WELLNESS VISIT 1 [facility]    Lab/Order associations:   ICD-10-CM   1. Dysuria  R30.0 POCT URINALYSIS DIP (CLINITEK)    Urine Culture    2. Primary hypertension  I10       Meds ordered this encounter  Medications   sulfamethoxazole -trimethoprim  (BACTRIM  DS) 800-160 MG tablet    Sig: Take 1 tablet by mouth 2 (two) times daily for 7 days.    Dispense:  14 tablet    Refill:  0    Return precautions advised.  Garnette Katrinka, MD

## 2023-11-07 NOTE — Patient Instructions (Addendum)
 UA concerning for urinary tract infection (UTI). Will get culture. Empiric treatment with: bactrim  for 7 days Patient to follow up if new or worsening symptoms or failure to improve.  -we will let you know about culture results in 2-3 days  Recommended follow up: Return for as needed for new, worsening, persistent symptoms.

## 2023-11-09 ENCOUNTER — Ambulatory Visit: Payer: Self-pay | Admitting: Family Medicine

## 2023-11-09 DIAGNOSIS — H0279 Other degenerative disorders of eyelid and periocular area: Secondary | ICD-10-CM | POA: Diagnosis not present

## 2023-11-09 DIAGNOSIS — H16212 Exposure keratoconjunctivitis, left eye: Secondary | ICD-10-CM | POA: Diagnosis not present

## 2023-11-09 DIAGNOSIS — H02055 Trichiasis without entropian left lower eyelid: Secondary | ICD-10-CM | POA: Diagnosis not present

## 2023-11-09 LAB — URINE CULTURE
MICRO NUMBER:: 16845635
SPECIMEN QUALITY:: ADEQUATE

## 2023-11-16 ENCOUNTER — Other Ambulatory Visit: Payer: Self-pay | Admitting: Family Medicine

## 2023-12-12 ENCOUNTER — Ambulatory Visit (INDEPENDENT_AMBULATORY_CARE_PROVIDER_SITE_OTHER): Admitting: Family Medicine

## 2023-12-12 ENCOUNTER — Encounter: Payer: Self-pay | Admitting: Family Medicine

## 2023-12-12 ENCOUNTER — Ambulatory Visit: Payer: Self-pay | Admitting: *Deleted

## 2023-12-12 VITALS — BP 120/70 | HR 82 | Temp 97.5°F | Ht 67.0 in | Wt 110.0 lb

## 2023-12-12 DIAGNOSIS — R2689 Other abnormalities of gait and mobility: Secondary | ICD-10-CM

## 2023-12-12 DIAGNOSIS — I10 Essential (primary) hypertension: Secondary | ICD-10-CM

## 2023-12-12 DIAGNOSIS — R42 Dizziness and giddiness: Secondary | ICD-10-CM

## 2023-12-12 DIAGNOSIS — E785 Hyperlipidemia, unspecified: Secondary | ICD-10-CM

## 2023-12-12 DIAGNOSIS — M79604 Pain in right leg: Secondary | ICD-10-CM

## 2023-12-12 LAB — GLUCOSE, POCT (MANUAL RESULT ENTRY): POC Glucose: 91 mg/dL (ref 70–99)

## 2023-12-12 NOTE — Telephone Encounter (Signed)
 FYI Only or Action Required?: FYI only for provider.  Patient was last seen in primary care on 11/07/2023 by Katrinka Garnette KIDD, MD.  Called Nurse Triage reporting Dizziness.  Symptoms began several weeks ago.  Interventions attempted: Rest, hydration, or home remedies.  Symptoms are: gradually worsening.  Triage Disposition: See PCP When Office is Open (Within 3 Days) 4 hours - 3 days   Patient/caregiver understands and will follow disposition?: Yes               Copied from CRM [id number]. Topic: Clinical - Red Word Triage >> Dec 12, 2023  8:02 AM Robinson H wrote: [facility] that prompted transfer to Nurse Triage: Dizzy off balance, not feeling well, lost husband 9/03 feels like it's getting worse since Reason for Disposition  [1] MODERATE dizziness (e.g., interferes with normal activities) AND [2] has been evaluated by doctor (or NP/PA) for this  Answer Assessment - Initial Assessment Questions Appt today with PCP.      1. DESCRIPTION: Describe your dizziness.     Off balance lightheaded 2. LIGHTHEADED: Do you feel lightheaded? (e.g., somewhat faint, woozy, weak upon standing)     Painful to stand  3. VERTIGO: Do you feel like either you or the room is spinning or tilting? (i.e., vertigo)     Sometimes room spinning  4. SEVERITY: How bad is it?  Do you feel like you are going to faint? Can you stand and walk?     Can walk but with pain causes patient difficulty to walk 5. ONSET:  When did the dizziness begin?     Since 11/23/23 6. AGGRAVATING FACTORS: Does anything make it worse? (e.g., standing, change in head position)     Standing  7. HEART RATE: Can you tell me your heart rate? How many beats in 15 seconds?  (Note: Not all patients can do this.)       na 8. CAUSE: What do you think is causing the dizziness? (e.g., decreased fluids or food, diarrhea, emotional distress, heat exposure, new medicine, sudden standing, vomiting; unknown)      Has been eating different foods since husband died 9. RECURRENT SYMPTOM: Have you had dizziness before? If Yes, ask: When was the last time? What happened that time?     Yes  10. OTHER SYMPTOMS: Do you have any other symptoms? (e.g., fever, chest pain, vomiting, diarrhea, bleeding)       Leg cramps from foot/ankle to knees, finger cramps, dizzy off  balance, frequent urination , thirsty  11. PREGNANCY: Is there any chance you are pregnant? When was your last menstrual period?       na  Protocols used: Dizziness - Lightheadedness-A-AH

## 2023-12-12 NOTE — Progress Notes (Signed)
 Phone [phone] In person visit   Subjective:   Lori [name] is a 83 y.o. year old very pleasant female patient who presents for/with See problem oriented charting Chief Complaint  Patient presents with   Dizziness    On and off; not having lightheadedness or vertigo, just feeling off balance and feeling extremely tired   Leg Pain    R leg has been bothering me lately, spasm starts out of in ankle and radiates up to the knee   Medication Problem    Want to discuss dosage intake for atorvastatin      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    Priority: Medium    Aortic atherosclerosis 02/11/2016    Priority: Medium    Hypertension 11/29/2013    Priority: Medium    Hyperlipidemia 11/29/2013    Priority: Medium    Barrett's esophagus 08/24/2013    Priority: Medium    Allergic rhinitis 03/09/2007    Priority: Medium    Osteoporosis 09/22/2006    Priority: Medium    IBS (irritable bowel syndrome) 06/26/2019    Priority: Low   Eczema 11/29/2013    Priority: Low   GERD (gastroesophageal reflux disease) 05/16/2013    Priority: Low   Vitamin D  deficiency 01/29/2012    Priority: Low   Solitary pulmonary nodule 06/08/2011    Priority: Low   HIATAL HERNIA WITH REFLUX 02/06/2010    Priority: Low   DEGENERATIVE JOINT DISEASE, KNEE 03/14/2008    Priority: Low   VARICOSE VEINS LOWER EXTREMITIES W/INFLAMMATION 03/09/2007    Priority: Low   ACTINIC KERATOSIS, FOREHEAD, LEFT 03/09/2007    Priority: Low    Headache(784.0) 03/09/2007    Priority: Low   MENIERE'S DISEASE 09/22/2006    Priority: Low   RAYNAUD'S DISEASE 09/22/2006    Priority: Low   Abnormal ear sensation, right 02/27/2021    Priority: 1.   Family history of kidney cancer 10/22/2020    Priority: 1.   Family history of thyroid  cancer 10/22/2020    Priority: 1.   Family history of multiple myeloma 10/22/2020    Priority: 1.   Family history of breast cancer 10/22/2020    Priority: 1.   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 Evening     atorvastatin  (LIPITOR) 20 MG tablet Take 1 tablet (20 mg total) by mouth 2 (two) times a week. 26 tablet 3   augmented betamethasone dipropionate (DIPROLENE-AF) 0.05 % cream Apply topically as needed.     cholecalciferol (VITAMIN D3) 25 MCG (1000 UNIT) tablet Take 1,000  Units by mouth daily. Take 1 tablet daily     cyanocobalamin  1000 MCG tablet Take 1,000 mcg by mouth once a week.     letrozole  (FEMARA ) 2.5 MG tablet TAKE 1 TABLET BY MOUTH EVERY DAY 90 tablet 3   metoprolol  succinate (TOPROL -XL) 25 MG 24 hr tablet Take 1 tablet (25 mg total) by mouth daily. 90 tablet 3   montelukast  (SINGULAIR ) 10 MG tablet TAKE 1 TABLET BY MOUTH NIGHTLY AT BEDTIME 90 tablet 1   pantoprazole  (PROTONIX ) 40 MG tablet TAKE 1 TABLET BY MOUTH EVERY DAY 90 tablet 1   No current facility-administered medications for this visit.     Objective:  BP 120/70   Pulse 82   Temp (!) 97.5 F (36.4 C)   Ht 5' 7 (1.702 m)   Wt 110 lb (49.9 kg)   SpO2 96%   BMI 17.23 kg/m  Gen: NAD, resting  comfortably CV: RRR no murmurs rubs or gallops Lungs: CTAB no crackles, wheeze, rhonchi Abdomen: soft/nontender/nondistended/normal bowel sounds. No rebound or guarding.  Ext: no edema Skin: warm, dry Neuro: CN II-XII intact, sensation and reflexes normal throughout, 5/5 muscle strength in bilateral upper and lower extremities. Normal finger to nose. Normal rapid alternating movements. No pronator drift. Normal romberg. antalgic gait walks with walker     Assessment and Plan   # Social update-  lost husband 11/23/23 - would have been married 48 years -supportive sons and legacy group at church -plans to order emergency necklace  # imbalance S: Reports off-and-on levels but persistent through the day. Worse with standing or wlaking- almost always at least mild and annoying. Feels may have started shortly before husband passed away but may have worsened.   Does not feel room spinning..  Denies feeling lightheaded-just feels off balance and extremely tired with these episodes- eyes are tired.    Has lost 3 pounds since losing her husband-not eating well due to grief/stress . Trying to stay hydrated. Added some [place] as somewhat constipated and dry mouthed   Does not feel like vertigo she had in past when she had vestibular rehab. Has had BPPV A/P: unclear cause- could be related to spinal stenosis? With right leg pain and possible radicular symptoms. Could be deconditioning or stress reltaed as well. Will try physical therapy for leg and for balance issues at home- doesn't drive right now, family brings her and walks with walker  - with imbalance could consider brain MRI but trial above first     # Right leg pain S: Patient reports a spasm starting at the ankle and radiating up to the knee on the right side if sitting down in chair. Can be very painful. No calf pain or swelling. Worse when goes to stand- once up and moving goes away. Never starts with standing.  Back does not hurt A/P:  suspicion could be radicular pain coming from the back. Walks with cane or walker. Will try for home health physical therapy  to see if we can help with this and imbalance   # Hypertension S: Medication: Amlodipine  2.5 mg daily, metoprolol  25 mg extended release BP Readings from Last 3 Encounters:  12/12/23 120/70  11/07/23 102/70  10/27/23 110/60  A/P:  blood pressure well controlled but perhaps overcontrolled and could contribute to her symptoms- we opted to hold the amlodipine  to see if that would help  # Hyperlipidemia/aortic atherosclerosis/statin myalgia #history of stroke on imaging- remote- on asa 81 mg  S:medication: 2025 trial atorvastatin  20  mg  twice weekly in past- 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  Lab Results  Component Value Date   CHOL 169 10/27/2023   HDL 82.40 10/27/2023   LDLCALC 77 10/27/2023   LDLDIRECT 74.0 02/06/2016   TRIG 48.0 10/27/2023   CHOLHDL 2 10/27/2023  A/P: LDL has been close to ideal goal but she is struggling to tolerate medicine. Asks about going back to twice a week and that would be great but I'm concerned if she is not feeling well if now is best time to try  - encouraged her to remain on once a week for now  #prediabetes- peak a1c of 6.3- she wanted to make sure no diabetes but sugar only 91   Recommended follow up: Return in about 1 month (around 01/11/2024) for followup or sooner if needed.Schedule b4 you leave. Future Appointments  Date Time Provider Department Center  05/01/2024 10:00 AM Katrinka Garnette KIDD, MD [facility] [place]  08/27/2024  1:45 PM Odean Potts, MD [facility] None  10/24/2024  1:40 PM [facility] ANNUAL WELLNESS VISIT 1 [facility]    Lab/Order associations:   ICD-10-CM   1. Primary hypertension  I10     2. Imbalance  R26.89      3. Right leg pain  M79.604     4. Hyperlipidemia, unspecified hyperlipidemia type  E78.5     5. Dizziness  R42 POCT glucose (manual entry)      No orders of the defined types were placed in this encounter.   Return precautions advised.  Garnette Katrinka, MD

## 2023-12-12 NOTE — Patient Instructions (Addendum)
 We have placed a referral for you today for home health physical therapy (for right leg pain and imbalance)- please call their # if you do not hear within a week (may be listed below or you may see mychart message within a few days with #).   Not removing from medication(s) list but I want her to hold the amlodipine  for now and see if she feels better with imbalance issues and perhaps fatigue. We offered bloodwork but she wants to try this first.   Sugar was fine at 91- high sugar is not the cause of your symptoms Recommended follow up: Return in about 1 month (around 01/11/2024) for followup or sooner if needed.Schedule b4 you leave.

## 2023-12-12 NOTE — Telephone Encounter (Signed)
 Patient scheduled for today at 3pm with pcp.

## 2024-01-03 DIAGNOSIS — H0279 Other degenerative disorders of eyelid and periocular area: Secondary | ICD-10-CM | POA: Diagnosis not present

## 2024-01-03 DIAGNOSIS — H16212 Exposure keratoconjunctivitis, left eye: Secondary | ICD-10-CM | POA: Diagnosis not present

## 2024-01-03 DIAGNOSIS — H02055 Trichiasis without entropian left lower eyelid: Secondary | ICD-10-CM | POA: Diagnosis not present

## 2024-01-09 ENCOUNTER — Telehealth: Payer: Self-pay

## 2024-01-09 NOTE — Telephone Encounter (Signed)
 Copied from CRM #[id number]. Topic: Clinical - Home Health Verbal Orders >> Jan 06, 2024  4:08 PM Jasmin G wrote: Caller/Agency: Ms. Paralee from [facility] Callback Number: [phone] Service Requested: Physical Therapy Frequency: 1 week 1, 2 week 3, 1 week 3 Any new concerns about the patient? No

## 2024-01-09 NOTE — Telephone Encounter (Signed)
 Verbal orders okay. Left vm for Liji

## 2024-01-16 ENCOUNTER — Encounter: Payer: Self-pay | Admitting: Family Medicine

## 2024-01-16 ENCOUNTER — Telehealth: Payer: Self-pay | Admitting: Family Medicine

## 2024-01-16 ENCOUNTER — Ambulatory Visit (INDEPENDENT_AMBULATORY_CARE_PROVIDER_SITE_OTHER): Admitting: Family Medicine

## 2024-01-16 VITALS — BP 148/68 | HR 78 | Temp 98.1°F | Ht 67.0 in | Wt 112.8 lb

## 2024-01-16 DIAGNOSIS — I10 Essential (primary) hypertension: Secondary | ICD-10-CM | POA: Diagnosis not present

## 2024-01-16 DIAGNOSIS — M25551 Pain in right hip: Secondary | ICD-10-CM | POA: Diagnosis not present

## 2024-01-16 DIAGNOSIS — Z23 Encounter for immunization: Secondary | ICD-10-CM

## 2024-01-16 DIAGNOSIS — R2689 Other abnormalities of gait and mobility: Secondary | ICD-10-CM

## 2024-01-16 DIAGNOSIS — R42 Dizziness and giddiness: Secondary | ICD-10-CM | POA: Diagnosis not present

## 2024-01-16 NOTE — Patient Instructions (Addendum)
 We have placed a referral for you today to [facility] - please call their # if you do not hear within a week (may be listed below or you may see mychart message within a few days with #).   Ok to take tylenol arthritis 1-2 tablets up to every 8 hours. Start with 1 tablet twice daily and definitely before PT  Blood Pressure slightly high but in pain- monitor at home and update me within 2 weeks as we work on getting pain down. For now still holding off on amlodipine   Considering brain MRI for ongoing imbalance/dizziness  Recommended follow up: Return in about 1 month (around 02/16/2024) for followup or sooner if needed.Schedule b4 you leave.

## 2024-01-16 NOTE — Progress Notes (Signed)
 Phone [phone] In person visit   Subjective:   Lori [name] is a 83 y.o. year old very pleasant female patient who presents for/with See problem oriented charting Chief Complaint  Patient presents with   Follow-up    1 month follow- up Hypertension. Highest BP she had was  a few days ago during physical therapy. Also being seen for leg pain and imbalance. Feels that physical therapy is going well. Having pain. Therapist says to take tylenol before the home PT. She wants to make sure that is ok and if she can take the arthritis tylenol? How much and how often? Still having pain in knees but Hip pain is new and pain level is 10 before tylenol.    Difficulty Walking    Dizziness has gotten better after stopping the amlodipine  but still having imbalanced issues. Also feels eyes are heavy and tired and wondering if that is causing the imbalance.   Weight Loss    Eating well balanced home cooked meals but is concerned about her weight. Has a child with Graves Diease and a Niece with thyroid  disease and Lupus. Wondering what could be causing the weight issues.     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    Priority: Medium    Aortic atherosclerosis 02/11/2016    Priority: Medium    Hypertension 11/29/2013    Priority: Medium    Hyperlipidemia 11/29/2013    Priority: Medium    Barrett's esophagus 08/24/2013    Priority: Medium    Allergic rhinitis 03/09/2007    Priority: Medium    Osteoporosis 09/22/2006     Priority: Medium    IBS (irritable bowel syndrome) 06/26/2019    Priority: Low   Eczema 11/29/2013    Priority: Low   GERD (gastroesophageal reflux disease) 05/16/2013    Priority: Low   Vitamin D  deficiency 01/29/2012    Priority: Low   Solitary pulmonary nodule 06/08/2011    Priority: Low   HIATAL HERNIA WITH REFLUX 02/06/2010    Priority: Low   DEGENERATIVE JOINT DISEASE, KNEE 03/14/2008    Priority: Low   VARICOSE VEINS LOWER EXTREMITIES W/INFLAMMATION 03/09/2007    Priority: Low   ACTINIC KERATOSIS, FOREHEAD, LEFT 03/09/2007    Priority: Low   Headache(784.0) 03/09/2007    Priority: Low   MENIERE'S DISEASE 09/22/2006    Priority: Low   RAYNAUD'S DISEASE 09/22/2006    Priority: Low   Abnormal ear sensation, right 02/27/2021    Priority: 1.   Family history of kidney cancer 10/22/2020    Priority: 1.   Family history of thyroid  cancer 10/22/2020    Priority: 1.   Family history of multiple myeloma 10/22/2020    Priority: 1.   Family history of breast cancer 10/22/2020    Priority: 1.   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 Evening     atorvastatin  (LIPITOR) 20 MG tablet Take 1 tablet (20 mg total) by mouth 2 (two) times a week. 26 tablet 3   augmented betamethasone dipropionate (DIPROLENE-AF) 0.05 % cream Apply topically as needed.     cholecalciferol (VITAMIN D3) 25 MCG (1000 UNIT) tablet Take 1,000 Units by mouth daily. Take 1 tablet daily     cyanocobalamin  1000 MCG tablet Take 1,000 mcg by mouth once a week.     letrozole  (FEMARA ) 2.5 MG tablet TAKE 1 TABLET BY MOUTH EVERY DAY 90 tablet 3   metoprolol  succinate (TOPROL -XL)  25 MG 24 hr tablet Take 1 tablet (25 mg total) by mouth daily. 90 tablet 3   montelukast  (SINGULAIR ) 10 MG tablet TAKE 1 TABLET BY MOUTH NIGHTLY AT BEDTIME 90 tablet 1   pantoprazole  (PROTONIX ) 40 MG tablet TAKE 1 TABLET BY MOUTH EVERY DAY 90 tablet 1   amLODipine  (NORVASC ) 2.5 MG tablet TAKE 1 TABLET BY MOUTH DAILY (Patient not taking: Reported on 01/16/2024) 90 tablet 3   No current facility-administered medications for this visit.     Objective:  BP (!) 148/68   Pulse 78   Temp 98.1 F (36.7 C) (Temporal)   Ht 5' 7 (1.702 m)   Wt 112 lb 12.8 oz (51.2 kg)   SpO2 98%   BMI 17.67 kg/m  Gen: NAD, resting comfortably CV: RRR no murmurs rubs or gallops Lungs: CTAB no crackles, wheeze, rhonchi Ext: no edema Skin: warm, dry     Assessment and Plan   #health maintenance - flu shot today    # imbalance/dizziness S:off and on issues since before losing her husband in September. No vertigo. Not lightheaded- just off balance and tired with heavy eyes she reports. But on further discussion seems like she has both spells of dizziness (which improved off of amlodipine ) as well as imbalance separately but this is honestly very difficult to ascertain. Eye movements with physical therapy trigger her to feel dizzy but she is adamant this does not feel like prior vertigo  Had lost weight down 3 lbs but back up 2 lbs- son has been encouraging her to eat  We wondered if spinal stenosis could be contributing- had some right leg pain and possible radicular symptoms. Had also discussed brain MRI  but she wanted to start with physical therapy first -last visit had pain going from ankle up to her knee particularly with standing. Was using cane or walker.  -she now has severe lateral hip pain but I wonder if this could be trochanteric bursitis   A/P: Imbalance/dizziness-difficult to pin this down specifically - For the imbalance portion as above wonder about spinal stenosis with ongoing back pain and  some radicular pain going into the right leg at times - Since she has lateral hip pain that is rather severe since starting PT she is willing to see sports medicine to try to understand this better-she would be interested in injection if possible.  Also would be interested in low back or hip imaging if that would be beneficial -Offered to place physical therapy on follow-up but she would like to push forward  -From ongoing chronic dizziness concern that seems to be somewhat separate from imbalance also discussed possible MRI of the brain given her age and risk factors-she declined  this last visit and her son also check with her insurance first.  She had an MRI to follow her angiogram in December of last year - she has also had dizziness previously in 2024 that has been hard to define- also offered neurology consult. Has seen cardiology and PVC's thought to be potentially playing role then but this feels different to her. Prior attempt at diltiazem  for PVC but caused rash -bloodwork in August but may repeat next visit  # Joint pain S:working with physical therapy to help on balance but also having knee pain as well as significant hip pain laterally   Pain is lateral and severe- worsening during physical therapy she thinks. Was not a focus of last visit but has worsened. Reports there is no fall. Also ongoing back pain for several yeras A/P: as above- refer to sports medicine -she asks about using tylenol arthritis and we discussed appropriate dosing  - handicap placard was written for- she has certainly slowed down recently  # Hypertension S: Medication: Amlodipine  2.5 mg daily in past but had to stop due to balance issues  at last visit, metoprolol  25 mg extended release. Reports less dizziness.  - Previously on amlodipine  up to 5 mg, previously metoprolol  caused bradycardia but tolerating 2025 - noted higher blood pressure with physical therapy -I'm ok with tyenol or tylenol arthritis before  physical therapy .  -she's also feeling heavy in her eyes and tired and wonders if contributing BP Readings from Last 3 Encounters:  01/16/24 (!) 148/68  12/12/23 120/70  11/07/23 102/70  A/P:  Pressure slightly high but in pain- monitor at home and update me within 2 weeks as we work on getting pain down -stay on metoprolol  but off amlodipine  for now with close follow up as below  Recommended follow up: Return in about 1 month (around 02/16/2024) for followup or sooner if needed.Schedule b4 you leave. Future Appointments  Date Time Provider Department Center  02/14/2024 10:20 AM Katrinka Garnette KIDD, MD [facility]  05/01/2024 10:00 AM Katrinka Garnette KIDD, MD [facility]  08/27/2024  1:45 PM Odean Potts, MD [facility] None  10/24/2024  1:40 PM [facility] ANNUAL WELLNESS VISIT 1 [facility]    Lab/Order associations:   ICD-10-CM   1. Immunization due  Z23 Flu vaccine HIGH DOSE PF(Fluzone Trivalent)    2. Dizziness  R42     3. Imbalance  R26.89     4. Lateral pain of right hip  M25.551 Ambulatory referral to Sports Medicine    5. Primary hypertension  I10      I personally spent a total of 45 minutes in the care of the patient today including preparing to see the patient, getting/reviewing separately obtained history, performing a medically appropriate exam/evaluation, counseling and educating, placing orders, and documenting clinical information in the EHR.patient very stressed about current medical ailments as is son- provided a lot of counseling an dreassurance and offered options for workup   Return precautions advised.  Garnette Katrinka, MD

## 2024-01-16 NOTE — Telephone Encounter (Signed)
 [facility] faxed Home Health Certificate (Order [place] [id number]), to be filled out by provider. Patient requested to send it back via Fax within ASAP. Document is located in providers tray at front office.Please advise at [phone].

## 2024-01-16 NOTE — Telephone Encounter (Signed)
 Completed and faxed back

## 2024-01-25 ENCOUNTER — Other Ambulatory Visit: Payer: Self-pay

## 2024-01-25 ENCOUNTER — Ambulatory Visit (INDEPENDENT_AMBULATORY_CARE_PROVIDER_SITE_OTHER)

## 2024-01-25 ENCOUNTER — Ambulatory Visit: Admitting: Family Medicine

## 2024-01-25 VITALS — BP 158/68 | HR 74 | Ht 67.0 in | Wt 113.0 lb

## 2024-01-25 DIAGNOSIS — M25551 Pain in right hip: Secondary | ICD-10-CM

## 2024-01-25 DIAGNOSIS — M25562 Pain in left knee: Secondary | ICD-10-CM

## 2024-01-25 DIAGNOSIS — G8929 Other chronic pain: Secondary | ICD-10-CM

## 2024-01-25 DIAGNOSIS — M25561 Pain in right knee: Secondary | ICD-10-CM | POA: Diagnosis not present

## 2024-01-25 DIAGNOSIS — M546 Pain in thoracic spine: Secondary | ICD-10-CM

## 2024-01-25 DIAGNOSIS — M170 Bilateral primary osteoarthritis of knee: Secondary | ICD-10-CM | POA: Diagnosis not present

## 2024-01-25 NOTE — Patient Instructions (Addendum)
 Thank you for coming in today.   You received an injection today. Seek immediate medical attention if the joint becomes red, extremely painful, or is oozing fluid.   Please get an Xray today before you leave   Continue working with home health physical therapy  Check back in 1 month

## 2024-01-25 NOTE — Progress Notes (Unsigned)
 LILLETTE Ileana Collet, PhD, LAT, ATC acting as a scribe for Artist Lloyd, MD.  Lori [name] is a 83 y.o. female who presents to [facility] today for R hip pain x a couple months ago, 2-3. Pt locates pain to the lateral aspect of her R hip.   She notes bilat knee pain w/ swelling present. Ongoing for awhile. She notes valgus deformity on her R leg. Severe pain on the lateral aspect of the R knee. She also notes pain deep to her L scapula.  Home health PT, started 2-wks ago.   Radiates: no Aggravates: increased activity, transitioning to stand Treatments tried: changes shoes, Tylenol  Dx testing: 03/30/21 DEXA  Pertinent review of systems: No fevers or chills  Relevant historical information: Osteoporosis.  Social determinants of health: Husband died about 2 weeks ago.   Exam:  BP (!) 158/68   Pulse 74   Ht 5' 7 (1.702 m)   Wt 113 lb (51.3 kg)   SpO2 98%   BMI 17.70 kg/m  General: Well Developed, well nourished, and in no acute distress.   MSK: T-spine: Normal appearing Nontender to palpation spinal midline.  Tender palpation paraspinal musculature.  Right hip normal-appearing tender palpation lateral hip.  Normal motion.  Right knee significant genu valgus.  Decreased range of motion.  Mildly tender to palpation lateral joint line.  Left knee mild effusion decreased motion mildly tender to palpation.    Lab and Radiology Results  Procedure: Real-time Ultrasound Guided Injection of right knee joint superior lateral patella space Device: Philips Affiniti 50G/GE Logiq Images permanently stored and available for review in PACS Verbal informed consent obtained.  Discussed risks and benefits of procedure. Warned about infection, bleeding, hyperglycemia damage to structures among others. Patient expresses understanding and agreement Time-out conducted.   Noted no overlying erythema, induration, or other signs of local infection.   Skin  prepped in a sterile fashion.   Local anesthesia: Topical Ethyl chloride.   With sterile technique and under real time ultrasound guidance: 40 mg of Kenalog  and 2 mL of Marcaine  injected into knee joint. Fluid seen entering the joint capsule.   Completed without difficulty   Pain immediately resolved suggesting accurate placement of the medication.   Advised to call if fevers/chills, erythema, induration, drainage, or persistent bleeding.   Images permanently stored and available for review in the ultrasound unit.  Impression: Technically successful ultrasound guided injection.   Procedure: Real-time Ultrasound Guided Injection of left knee joint superior lateral patella space Device: Philips Affiniti 50G/GE Logiq Images permanently stored and available for review in PACS Verbal informed consent obtained.  Discussed risks and benefits of procedure. Warned about infection, bleeding, hyperglycemia damage to structures among others. Patient expresses understanding and agreement Time-out conducted.   Noted no overlying erythema, induration, or other signs of local infection.   Skin prepped in a sterile fashion.   Local anesthesia: Topical Ethyl chloride.   With sterile technique and under real time ultrasound guidance: 40 mg of Kenalog  and 2 mL of Marcaine  injected into knee joint. Fluid seen entering the joint capsule.   Completed without difficulty   Pain immediately resolved suggesting accurate placement of the medication.   Advised to call if fevers/chills, erythema, induration, drainage, or persistent bleeding.   Images permanently stored and available for review in the ultrasound unit.  Impression: Technically successful ultrasound guided injection.   X-ray images T-spine, bilateral knees, right hip obtained today personally and independently interpreted.  T-spine: No acute fractures.  Mild degenerative changes.  Right hip: No acute fracture.  No significant degenerative  changes.  Right knee: Severe lateral DJD  Left knee: Severe medial DJD  Await formal radiology review    Assessment and Plan: 83 y.o. female with bilateral knee pain due to severe DJD.  Plan for steroid injection.  Patient is already engaging with home health physical therapy which would be helpful.  Right lateral hip pain due to trochanteric bursitis.  Again PT is going to be helpful here consider injection in the future.  Pain around T-spine: No acute vertebral compression fractures visible on my interpretation.  Again physical therapy.  Check again in 1 month.  PDMP not reviewed this encounter. Orders Placed This Encounter  Procedures   US  LIMITED JOINT SPACE STRUCTURES LOW BILAT(NO LINKED CHARGES)    Reason for Exam (SYMPTOM  OR DIAGNOSIS REQUIRED):   bilateral knee pain    Preferred imaging location?:   [facility] Knee AP/LAT W/[place] Left    Standing Status:   Future    Number of Occurrences:   1    Expiration Date:   02/24/2024    Reason for Exam (SYMPTOM  OR DIAGNOSIS REQUIRED):   bilateral knee pain    Preferred imaging location?:   [place] [facility]   DG Knee AP/LAT W/[place] Right    Standing Status:   Future    Number of Occurrences:   1    Expiration Date:   02/24/2024    Reason for Exam (SYMPTOM  OR DIAGNOSIS REQUIRED):   bilateral knee pain    Preferred imaging location?:   [place] [employer]   DG HIP UNILAT W OR W/O PELVIS 2-3 VIEWS RIGHT    Standing Status:   Future    Number of Occurrences:   1    Expiration Date:   02/24/2024    Reason for Exam (SYMPTOM  OR DIAGNOSIS REQUIRED):   right hip pain    Preferred imaging location?:   [facility]   DG Thoracic Spine W/[place]    Standing Status:   Future    Number of Occurrences:   1    Expiration Date:   01/24/2025    Reason for Exam (SYMPTOM  OR DIAGNOSIS REQUIRED):   thoracic back pain    Preferred imaging location?:   [facility] [place]   No orders of the  defined types were placed in this encounter.    Discussed warning signs or symptoms. Please see discharge instructions. Patient expresses understanding.   The above documentation has been reviewed and is accurate and complete Artist Lloyd, M.D.

## 2024-01-26 ENCOUNTER — Ambulatory Visit: Admitting: Family Medicine

## 2024-01-26 DIAGNOSIS — M170 Bilateral primary osteoarthritis of knee: Secondary | ICD-10-CM | POA: Insufficient documentation

## 2024-01-31 ENCOUNTER — Ambulatory Visit: Payer: Self-pay | Admitting: Family Medicine

## 2024-01-31 NOTE — Progress Notes (Signed)
Pt viewed results via my chart

## 2024-01-31 NOTE — Progress Notes (Signed)
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

## 2024-01-31 NOTE — Progress Notes (Signed)
 Left knee x-ray shows severe arthritis.

## 2024-01-31 NOTE — Progress Notes (Signed)
Right knee x-ray shows severe arthritis

## 2024-01-31 NOTE — Progress Notes (Signed)
 Thoracic spine x-ray shows mild arthritis.

## 2024-02-14 ENCOUNTER — Encounter: Payer: Self-pay | Admitting: Family Medicine

## 2024-02-14 ENCOUNTER — Ambulatory Visit (INDEPENDENT_AMBULATORY_CARE_PROVIDER_SITE_OTHER): Admitting: Family Medicine

## 2024-02-14 ENCOUNTER — Ambulatory Visit: Payer: Self-pay | Admitting: Family Medicine

## 2024-02-14 VITALS — BP 128/68 | HR 101 | Temp 97.9°F | Ht 67.0 in | Wt 111.2 lb

## 2024-02-14 DIAGNOSIS — R2689 Other abnormalities of gait and mobility: Secondary | ICD-10-CM | POA: Diagnosis not present

## 2024-02-14 DIAGNOSIS — I10 Essential (primary) hypertension: Secondary | ICD-10-CM | POA: Diagnosis not present

## 2024-02-14 DIAGNOSIS — R42 Dizziness and giddiness: Secondary | ICD-10-CM

## 2024-02-14 DIAGNOSIS — E785 Hyperlipidemia, unspecified: Secondary | ICD-10-CM | POA: Diagnosis not present

## 2024-02-14 LAB — CBC WITH DIFFERENTIAL/PLATELET
Basophils Absolute: 0 K/uL (ref 0.0–0.1)
Basophils Relative: 0.4 % (ref 0.0–3.0)
Eosinophils Absolute: 0.1 K/uL (ref 0.0–0.7)
Eosinophils Relative: 1.6 % (ref 0.0–5.0)
HCT: 37 % (ref 36.0–46.0)
Hemoglobin: 12.2 g/dL (ref 12.0–15.0)
Lymphocytes Relative: 24.5 % (ref 12.0–46.0)
Lymphs Abs: 1.4 K/uL (ref 0.7–4.0)
MCHC: 32.9 g/dL (ref 30.0–36.0)
MCV: 86.6 fl (ref 78.0–100.0)
Monocytes Absolute: 0.7 K/uL (ref 0.1–1.0)
Monocytes Relative: 13.3 % — ABNORMAL HIGH (ref 3.0–12.0)
Neutro Abs: 3.4 K/uL (ref 1.4–7.7)
Neutrophils Relative %: 60.2 % (ref 43.0–77.0)
Platelets: 276 K/uL (ref 150.0–400.0)
RBC: 4.27 Mil/uL (ref 3.87–5.11)
RDW: 14.2 % (ref 11.5–15.5)
WBC: 5.6 K/uL (ref 4.0–10.5)

## 2024-02-14 LAB — COMPREHENSIVE METABOLIC PANEL WITH GFR
ALT: 18 U/L (ref 0–35)
AST: 17 U/L (ref 0–37)
Albumin: 4.1 g/dL (ref 3.5–5.2)
Alkaline Phosphatase: 72 U/L (ref 39–117)
BUN: 26 mg/dL — ABNORMAL HIGH (ref 6–23)
CO2: 26 meq/L (ref 19–32)
Calcium: 9.3 mg/dL (ref 8.4–10.5)
Chloride: 104 meq/L (ref 96–112)
Creatinine, Ser: 0.63 mg/dL (ref 0.40–1.20)
GFR: 82.9 mL/min (ref >60.00)
Glucose, Bld: 87 mg/dL (ref 70–99)
Potassium: 4.2 meq/L (ref 3.5–5.1)
Sodium: 137 meq/L (ref 135–145)
Total Bilirubin: 0.4 mg/dL (ref 0.2–1.2)
Total Protein: 6.9 g/dL (ref 6.0–8.3)

## 2024-02-14 LAB — TSH: TSH: 0.84 u[IU]/mL (ref 0.35–5.50)

## 2024-02-14 NOTE — Progress Notes (Signed)
 Phone [phone] In person visit   Subjective:   Lori [name] is a 83 y.o. year old very pleasant female patient who presents for/with See problem oriented charting Chief Complaint  Patient presents with   Medical Management of Chronic Issues    One month follow up; pt would like to discuss and Dr. 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    Priority: Medium    Aortic atherosclerosis 02/11/2016    Priority: Medium    Hypertension 11/29/2013    Priority: Medium    Hyperlipidemia 11/29/2013    Priority: Medium    Barrett's esophagus 08/24/2013    Priority: Medium    Allergic rhinitis 03/09/2007    Priority: Medium    Osteoporosis 09/22/2006    Priority: Medium    IBS (irritable bowel syndrome) 06/26/2019    Priority: Low   Eczema 11/29/2013    Priority: Low   GERD (gastroesophageal reflux disease) 05/16/2013    Priority: Low   Vitamin D  deficiency 01/29/2012    Priority: Low   Solitary pulmonary nodule 06/08/2011    Priority: Low   HIATAL HERNIA WITH REFLUX 02/06/2010    Priority: Low   DEGENERATIVE JOINT DISEASE, KNEE 03/14/2008    Priority: Low   VARICOSE VEINS LOWER EXTREMITIES W/INFLAMMATION 03/09/2007    Priority: Low   ACTINIC KERATOSIS, FOREHEAD, LEFT 03/09/2007    Priority: Low   Headache(784.0) 03/09/2007    Priority: Low   MENIERE'S DISEASE  09/22/2006    Priority: Low   RAYNAUD'S DISEASE 09/22/2006    Priority: Low   Abnormal ear sensation, right 02/27/2021    Priority: 1.   Family history of kidney cancer 10/22/2020    Priority: 1.   Family history of thyroid  cancer 10/22/2020    Priority: 1.   Family history of multiple myeloma 10/22/2020    Priority: 1.   Family history of breast cancer 10/22/2020    Priority: 1.   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 Evening     atorvastatin  (LIPITOR) 20 MG tablet Take 1 tablet (20 mg total) by mouth 2 (two) times a week. 26 tablet 3   augmented betamethasone dipropionate (DIPROLENE-AF) 0.05 % cream Apply topically as needed.     cholecalciferol (VITAMIN D3) 25 MCG (1000 UNIT) tablet Take 1,000 Units by mouth daily. Take 1 tablet  daily     cyanocobalamin  1000 MCG tablet Take 1,000 mcg by mouth once a week.     letrozole  (FEMARA ) 2.5 MG tablet TAKE 1 TABLET BY MOUTH EVERY DAY 90 tablet 3   metoprolol  succinate (TOPROL -XL) 25 MG 24 hr tablet Take 1 tablet (25 mg total) by mouth daily. 90 tablet 3   montelukast  (SINGULAIR ) 10 MG tablet TAKE 1 TABLET BY MOUTH NIGHTLY AT BEDTIME 90 tablet 1   pantoprazole  (PROTONIX ) 40 MG tablet TAKE 1 TABLET BY MOUTH EVERY DAY 90 tablet 1   No current facility-administered medications for this visit.     Objective:  BP 128/68 (BP Location: Left Arm, Patient Position: Sitting, Cuff Size: Normal)   Pulse (!) 101   Temp 97.9 F (36.6 C) (Temporal)   Ht 5' 7 (1.702 m)   Wt 111 lb  3.2 oz (50.4 kg)   SpO2 97%   BMI 17.42 kg/m  Gen: NAD, resting comfortably CV: Not tachycardic on my exam-no murmurs rubs or gallops Lungs: CTAB no crackles, wheeze, rhonchi Ext: Minimal edema Skin: warm, dry Neuro: Walks with walker    Assessment and Plan   # Imbalance/dizziness S: We had wondered at last visit if she could have some spinal stenosis contributing ongoing back pain as well as balance.  She was having lateral hip pain after PT and we were interested in sports medicine perspective-we referred and she has received a lot of good info as below with severe bilateral knee arthritis. - Ongoing chronic dizziness as well that seems separate from imbalance issues-had offered MRI of the brain and she declined at that time-did have reassuring MRI angiogram in December 2024.  Ongoing chronic dizziness issues and had offered neurology consult but she wanted to hold off.  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  Diltiazem  for PVCs in the past caused rash.  Balance better with therapy.   Dizziness better too A/P: Patient is making significant strides with imbalance with therapy-especially with the possibility of her going into knee surgery in 2026 we want to maximize this as best as possible and I would like to have her continue in therapy  Dizziness is better-unclear what improved in this room but thankful she is doing better.  Denies palpitations, chest pain, shortness of breath-does not appear to be cardiac.  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  She was referred to physical therapy for the knees as well.  Was seen about 20 days ago with plan for 1 month follow-up and is already scheduled on 02/24/2024  She's making progress but is about to have her therapy end per medicare.  A/P: knee/hip/balance issues- she is making progress but still has room to go with balance and pain- I have encouraged continued physical therapy and they are going to appeal and we will certainly support     #Difficulty gaining weight- may add protein and some calories (particularly at night) to see if that would help her but  some reflux issues. Despite limitations from reflux she's been eating well and has trouble maintaining weight- essentially stable lately. Burning mouth syndrome and reflux somewhat better lately thankfully but these can be barriers As she does have prediabetes but we want to focus on healthy weight over being overly focused on prediabetes  # Hypertension S: Medication:  metoprolol  25 mg extended release, amlodipine  2.5 mg daily -trial Off amlodipine  2.5 mg daily for balance issues/dizziness- but later restarted after dizziness improved and has not recurred A/P: Blood pressure well-controlled-continue current medication  # Hyperlipidemia/aortic atherosclerosis/statin myalgia #history of stroke on imaging- remote- on asa 81 mg  S:medication: 2025 trial atorvastatin  twice weeklycaused achiness and diffuse rash- lichenoid dermatitis- restarted this and tolerating better.  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 Future Appointments  Date Time Provider Department Center  02/24/2024 11:30 AM Joane Artist RAMAN, MD [facility] None  05/01/2024 10:00 AM Katrinka Garnette KIDD, MD [facility]  08/27/2024  1:45 PM Odean Potts, MD [facility] None  10/24/2024  1:40 PM [facility] ANNUAL WELLNESS VISIT 1 [facility] [place]    Lab/Order associations:   ICD-10-CM   1. Imbalance  R26.89     2. Hyperlipidemia, unspecified hyperlipidemia type  E78.5 CBC with Differential/Platelet    Comprehensive metabolic panel with GFR    TSH    3. Dizziness  R42     4. Primary hypertension  I10       No orders of the defined types were placed in this encounter.   Return precautions advised.  Garnette Katrinka, MD

## 2024-02-14 NOTE — Patient Instructions (Addendum)
 If you can find one you tolerate adding a protein drink once or twice a day in ADDITION to your meals not as a substitute  Please stop by lab before you go If you have mychart- we will send your results within 3 business days of us  receiving them.  If you do not have mychart- we will call you about results within 5 business days of us  receiving them.  *please also note that you will see labs on mychart as soon as they post. I will later go in and write notes on them- will say notes from Dr. Katrinka   Recommended follow up: Return for next already scheduled visit or sooner if needed.

## 2024-02-15 ENCOUNTER — Telehealth: Payer: Self-pay

## 2024-02-15 NOTE — Telephone Encounter (Signed)
 Please Advise  Copied from CRM #[id number]. Topic: Clinical - Home Health Verbal Orders >> Feb 15, 2024  2:42 PM Suzen RAMAN wrote: Caller/Agency: [facility]/ [facility] Callback Number: [phone] Service Requested: Physical Therapy Frequency: once a week for 2 more weeks(Request for extension) Any new concerns about the patient? No

## 2024-02-20 NOTE — Telephone Encounter (Signed)
May provide verbal orders

## 2024-02-22 ENCOUNTER — Other Ambulatory Visit: Payer: Self-pay | Admitting: Family Medicine

## 2024-02-22 ENCOUNTER — Telehealth: Payer: Self-pay | Admitting: Family Medicine

## 2024-02-22 NOTE — Telephone Encounter (Signed)
 Okay to proceed with verbal orders. [facility] aware.   Copied from CRM #[id number]. Topic: Clinical - Home Health Verbal Orders >> Feb 22, 2024 12:35 PM Lori [name] wrote: Caller/Agency: Lori [name] (Self)   Callback Number: [phone]  Service Requested: Physical Therapy Frequency: extend pt once a week for 2 more weeks and then recertify Any new concerns about the patient? No

## 2024-02-24 ENCOUNTER — Ambulatory Visit: Admitting: Family Medicine

## 2024-02-24 VITALS — BP 122/70 | HR 91 | Ht 67.0 in | Wt 110.6 lb

## 2024-02-24 DIAGNOSIS — M25561 Pain in right knee: Secondary | ICD-10-CM | POA: Diagnosis not present

## 2024-02-24 DIAGNOSIS — M170 Bilateral primary osteoarthritis of knee: Secondary | ICD-10-CM | POA: Diagnosis not present

## 2024-02-24 DIAGNOSIS — M25551 Pain in right hip: Secondary | ICD-10-CM

## 2024-02-24 DIAGNOSIS — G8929 Other chronic pain: Secondary | ICD-10-CM

## 2024-02-24 DIAGNOSIS — M546 Pain in thoracic spine: Secondary | ICD-10-CM

## 2024-02-24 DIAGNOSIS — M25562 Pain in left knee: Secondary | ICD-10-CM

## 2024-02-24 NOTE — Telephone Encounter (Signed)
 Lori [name] called in to check on status of this verbal being approved.She stated that she never heard from anyone,however there is documentation of [facility] being made aware.

## 2024-02-24 NOTE — Telephone Encounter (Signed)
 Sent patient message via MyChart

## 2024-02-24 NOTE — Progress Notes (Signed)
   LILLETTE Claretha Schimke am a scribe for Dr. Artist Lloyd.    Mckennah Lori [name] is a 83 y.o. female who presents to [facility] today for follow-up bilateral knee pain, right lateral hip pain, and pain around T-spine.  Patient was seen about a month ago on November 5 and received bilateral knee injections for knee pain due to severe DJD.  Right lateral hip pain diagnosed as trochanteric bursitis treated with physical therapy referral  Chronic low back pain again plan for physical therapy.  Patient states that the pain level is about a 6-7. Left knee is worse than the right. Doing good exercises for the back almost daily.   In the interval she notes the knee pain did improve with steroid injection but only lasted about a month.   Pertinent review of systems: No fevers or chills  Relevant historical information: Osteoporosis   Exam:  BP 122/70   Pulse 91   Ht 5' 7 (1.702 m)   Wt 110 lb 9.6 oz (50.2 kg)   SpO2 95%   BMI 17.32 kg/m  General: Well Developed, well nourished, and in no acute distress.   MSK: Bilateral knees mild effusion.  Decreased motion    Lab and Radiology Results No results found for this or any previous visit (from the past 72 hours). No results found.     Assessment and Plan: 83 y.o. female with bilateral knee pain due to DJD.  Plan for authorization of single dose hyaluronic acid injection.  This could be helpful.  However it looks like she will be heading towards knee replacement at some point in the future.  We discussed options and we will go ahead and refer now to orthopedic surgery to start talking about plans and options going forward.  Hopefully the gel injections will provide some lasting relief.  Hip low back and upper back pain plan for physical therapy.   PDMP not reviewed this encounter. Orders Placed This Encounter  Procedures   AMB referral to orthopedics    Referral Priority:   Routine    Referral  Type:   Consultation    Referred to Provider:   Vernetta Lonni GRADE, MD    Number of Visits Requested:   1   No orders of the defined types were placed in this encounter.    Discussed warning signs or symptoms. Please see discharge instructions. Patient expresses understanding.   The above documentation has been reviewed and is accurate and complete Artist Lloyd, M.D.

## 2024-02-24 NOTE — Patient Instructions (Addendum)
 Referral to Dr. Vernetta at [place]. Gel for bilateral knees. Single dose injections. Return once gel injections have been approved.

## 2024-03-01 ENCOUNTER — Encounter: Payer: Self-pay | Admitting: Orthopaedic Surgery

## 2024-03-01 ENCOUNTER — Ambulatory Visit: Admitting: Orthopaedic Surgery

## 2024-03-01 VITALS — Wt 111.0 lb

## 2024-03-01 DIAGNOSIS — M25562 Pain in left knee: Secondary | ICD-10-CM

## 2024-03-01 DIAGNOSIS — M1712 Unilateral primary osteoarthritis, left knee: Secondary | ICD-10-CM

## 2024-03-01 DIAGNOSIS — G8929 Other chronic pain: Secondary | ICD-10-CM | POA: Diagnosis not present

## 2024-03-01 DIAGNOSIS — M1711 Unilateral primary osteoarthritis, right knee: Secondary | ICD-10-CM | POA: Insufficient documentation

## 2024-03-01 DIAGNOSIS — M25561 Pain in right knee: Secondary | ICD-10-CM

## 2024-03-01 NOTE — Progress Notes (Signed)
 The patient is an 83 year old female I am seeing for the first time today with her son.  She is sent from Dr. Artist Lloyd who is seeing her for her knees and she is also a patient of Dr. Garnette Lukes.  She has known severe and stage arthritis of both her knees with windswept knees with the left knee being in varus malalignment and the right knee being in valgus malalignment.  This is affecting her hips and her back and posture as well.  She does ambulate with a rolling walker.  She lives alone but has good support of her sons.  She is not a diabetic.  She is very thin and is not a smoker.  I was able to review all of her medications and past medical history within epic and she does see her primary care physician on a regular basis.  She does report daily knee pain that is detriment affecting her mobility, her quality of life and her actives daily living.  She has become a fall risk as a result of her knees.  She has tried and failed all forms conservative treatment.  Her pain can be 10 out of 10.  She is here today to discuss knee replacement surgery.  On examination of both her knees her right knee has valgus malalignment and the left knee has varus malalignment.  Both knees have significant patellofemoral capitation and the malalignment is quite significant.  She does report history of a left knee arthroscopy many years ago with a partial medial meniscectomy.  X-rays of both knees are reviewed today and show severe end-stage arthritis of both knees with valgus malalignment the right knee and varus malalignment of the left knee with both knees having severe bone-on-bone arthritis and osteophytes in all 3 compartments.  We had a long and thorough discussion about knee replacement surgery.  We discussed the risks and benefits of the surgery and what to expect from an intraoperative and postoperative standpoint.  We went over her x-rays in detail as well as a knee replacement model.  All questions and concerns  were answered and addressed.  She would like to go ahead and have a left knee replaced when we are available to do this so we will work on getting on the schedule.  She knows that we will be in touch with scheduling the surgery.  Her son also agrees as well.

## 2024-03-05 ENCOUNTER — Telehealth: Payer: Self-pay | Admitting: Family Medicine

## 2024-03-05 NOTE — Telephone Encounter (Signed)
 Okay to re-certify. Verbal orders okayed.    Copied from CRM #[id number]. Topic: Clinical - Home Health Verbal Orders >> Mar 05, 2024 10:35 AM Charolett CROME wrote: Caller/Agency: Sreej[facility] Callback Number: [phone] Service Requested: Physical Therapy Frequency: 1x a week for 5 more weeks also to re-certify  Any new concerns about the patient? No

## 2024-03-09 ENCOUNTER — Telehealth: Payer: Self-pay

## 2024-03-09 NOTE — Telephone Encounter (Signed)
 I called patient to discuss scheduling left TKA.  She will talk to her son about his schedule and he or she will call me back to schedule the surgery.

## 2024-03-14 ENCOUNTER — Telehealth: Payer: Self-pay | Admitting: Family Medicine

## 2024-03-14 NOTE — Telephone Encounter (Signed)
 [facility] faxed Home Health Certificate (Order [place] [id number]), to be filled out by provider. Patient requested to send it back via Fax within ASAP. Document is located in providers tray at front office.Please advise at [phone].

## 2024-03-16 NOTE — Telephone Encounter (Signed)
Placed in provider box for review and signature.

## 2024-03-19 NOTE — Telephone Encounter (Signed)
 Please provide verbal's if that would be helpful.  Otherwise tell them I will not be able to do this until Friday when I return

## 2024-04-05 ENCOUNTER — Other Ambulatory Visit: Payer: Self-pay | Admitting: Physician Assistant

## 2024-04-05 DIAGNOSIS — Z01818 Encounter for other preprocedural examination: Secondary | ICD-10-CM

## 2024-04-10 NOTE — Patient Instructions (Signed)
 SURGICAL WAITING ROOM VISITATION  Patients having surgery or a procedure may have no more than 2 support people in the waiting area - these visitors may rotate.    Children ages 56 and under will not be able to visit patients in [facility] under most circumstances.   Visitors with respiratory illnesses are discouraged from visiting and should remain at home.  If the patient needs to stay at the hospital during part of their recovery, the visitor guidelines for inpatient rooms apply. Pre-op nurse will coordinate an appropriate time for 1 support person to accompany patient in pre-op.  This support person may not rotate.    Please refer to the [facility] website for the visitor guidelines for Inpatients (after your surgery is over and you are in a regular room).       Your procedure is scheduled on: 04/20/24   Report to [facility] Main Entrance    Report to admitting at 8:45 AM   Call this number if you have problems the morning of surgery [phone]   Do not eat food :After Midnight.   After Midnight you may have the following liquids until 8:15 AM DAY OF SURGERY  Water Non-Citrus Juices (without pulp, NO RED-Apple, White grape, White cranberry) Black Coffee (NO MILK/CREAM OR CREAMERS, sugar ok)  Clear Tea (NO MILK/CREAM OR CREAMERS, sugar ok) regular and decaf                             Plain Jell-O (NO RED)                                           Fruit ices (not with fruit pulp, NO RED)                                     Popsicles (NO RED)                                                               Sports drinks like Gatorade (NO RED)                  The day of surgery:  Drink ONE (1) Pre-Surgery G2 at 8:15 AM the morning of surgery. Drink in one sitting. Do not sip.  This drink was given to you during your hospital  pre-op appointment visit. Nothing else to drink after completing the  Pre-Surgery Clear Ensure    Oral Hygiene is also  important to reduce your risk of infection.                                    Remember - BRUSH YOUR TEETH THE MORNING OF SURGERY WITH YOUR REGULAR TOOTHPASTE  DENTURES WILL BE REMOVED PRIOR TO SURGERY PLEASE DO NOT APPLY Poly grip OR ADHESIVES!!!   Stop all vitamins and herbal supplements 7 days before surgery.   Take these medicines the morning of surgery with A SIP OF WATER: tylenol, atorvastatin (lipitor), letrozole (femara ),  metoprolol (toprol ), pantoprazole (protonix ),              You may not have any metal on your body including hair pins, jewelry, and body piercing             Do not wear make-up, lotions, powders, perfumes/cologne, or deodorant  Do not wear nail polish including gel and S&S, artificial/acrylic nails, or any other type of covering on natural nails including finger and toenails. If you have artificial nails, gel coating, etc. that needs to be removed by a nail salon please have this removed prior to surgery or surgery may need to be canceled/ delayed if the surgeon/ anesthesia feels like they are unable to be safely monitored.   Do not shave  48 hours prior to surgery.             Do not bring valuables to the hospital. [place] [facility] IS NOT             RESPONSIBLE   FOR VALUABLES.   Contacts, glasses, dentures or bridgework may not be worn into surgery.   Bring small overnight bag day of surgery.   DO NOT BRING YOUR HOME MEDICATIONS TO THE HOSPITAL. PHARMACY WILL DISPENSE MEDICATIONS LISTED ON YOUR MEDICATION LIST TO YOU DURING YOUR ADMISSION IN THE HOSPITAL!    Patients discharged on the day of surgery will not be allowed to drive home.  Someone NEEDS to stay with you for the first 24 hours after anesthesia.   Special Instructions: Bring a copy of your healthcare power of attorney and living will documents the day of surgery if you haven't scanned them before.              Please read over the following fact sheets you were given: IF YOU HAVE QUESTIONS ABOUT  YOUR PRE-OP INSTRUCTIONS PLEASE CALL [phone] [facility].   If you received a COVID test during your pre-op visit  it is requested that you wear a mask when out in public, stay away from anyone that may not be feeling well and notify your surgeon if you develop symptoms. If you test positive for Covid or have been in contact with anyone that has tested positive in the last 10 days please notify you surgeon.      Pre-operative 4 CHG Bath Instructions  DYNA-Hex 4 Chlorhexidine  Gluconate 4% Solution Antiseptic 4 fl. oz   You can play a key role in reducing the risk of infection after surgery. Your skin needs to be as free of germs as possible. You can reduce the number of germs on your skin by washing with CHG (chlorhexidine  gluconate) soap before surgery. CHG is an antiseptic soap that kills germs and continues to kill germs even after washing.   DO NOT use if you have an allergy to chlorhexidine /CHG or antibacterial soaps. If your skin becomes reddened or irritated, stop using the CHG and notify one of our RNs at   Please shower with the CHG soap starting 4 days before surgery using the following schedule:     Please keep in mind the following:  DO NOT shave, including legs and underarms, starting the day of your first shower.   You may shave your face at any point before/day of surgery.  Place clean sheets on your bed the day you start using CHG soap. Use a clean washcloth (not used since being washed) for each shower. DO NOT sleep with pets once you start using the CHG.  CHG Shower Instructions:  If  you choose to wash your hair and private area, wash first with your normal shampoo/soap.  After you use shampoo/soap, rinse your hair and body thoroughly to remove shampoo/soap residue.  Turn the water OFF and apply about 3 tablespoons (45 ml) of CHG soap to a CLEAN washcloth.  Apply CHG soap ONLY FROM YOUR NECK DOWN TO YOUR TOES (washing for 3-5 minutes)  DO NOT use CHG soap on face,  private areas, open wounds, or sores.  Pay special attention to the area where your surgery is being performed.  If you are having back surgery, having someone wash your back for you may be helpful. Wait 2 minutes after CHG soap is applied, then you may rinse off the CHG soap.  Pat dry with a clean towel  Put on clean clothes/pajamas   If you choose to wear lotion, please use ONLY the CHG-compatible lotions on the back of this paper.     Additional instructions for the day of surgery: DO NOT APPLY any lotions, deodorants, cologne, or perfumes.   Put on clean/comfortable clothes.  Brush your teeth.  Ask your nurse before applying any prescription medications to the skin.   CHG Compatible Lotions   Aveeno Moisturizing lotion  Cetaphil Moisturizing Cream  Cetaphil Moisturizing Lotion  Clairol Herbal Essence Moisturizing Lotion, Dry Skin  Clairol Herbal Essence Moisturizing Lotion, Extra Dry Skin  Clairol Herbal Essence Moisturizing Lotion, Normal Skin  Curel Age Defying Therapeutic Moisturizing Lotion with Alpha Hydroxy  Curel Extreme Care Body Lotion  Curel Soothing Hands Moisturizing Hand Lotion  Curel Therapeutic Moisturizing Cream, Fragrance-Free  Curel Therapeutic Moisturizing Lotion, Fragrance-Free  Curel Therapeutic Moisturizing Lotion, Original Formula  Eucerin Daily Replenishing Lotion  Eucerin Dry Skin Therapy Plus Alpha Hydroxy Crme  Eucerin Dry Skin Therapy Plus Alpha Hydroxy Lotion  Eucerin Original Crme  Eucerin Original Lotion  Eucerin Plus Crme Eucerin Plus Lotion  Eucerin TriLipid Replenishing Lotion  Keri Anti-Bacterial Hand Lotion  Keri Deep Conditioning Original Lotion Dry Skin Formula Softly Scented  Keri Deep Conditioning Original Lotion, Fragrance Free Sensitive Skin Formula  Keri Lotion Fast Absorbing Fragrance Free Sensitive Skin Formula  Keri Lotion Fast Absorbing Softly Scented Dry Skin Formula  Keri Original Lotion  Keri Skin Renewal Lotion Keri  Silky Smooth Lotion  Keri Silky Smooth Sensitive Skin Lotion  Nivea Body Creamy Conditioning Oil  Nivea Body Extra Enriched Lotion  Nivea Body Original Lotion  Nivea Body Sheer Moisturizing Lotion Nivea Crme  Nivea Skin Firming Lotion  NutraDerm 30 Skin Lotion  NutraDerm Skin Lotion  NutraDerm Therapeutic Skin Cream  NutraDerm Therapeutic Skin Lotion  ProShield Protective Hand Cream    Incentive Spirometer (Watch this video at home: [email or web address])  An incentive spirometer is a tool that can help keep your lungs clear and active. This tool measures how well you are filling your lungs with each breath. Taking long deep breaths may help reverse or decrease the chance of developing breathing (pulmonary) problems (especially infection) following: A long period of time when you are unable to move or be active. BEFORE THE PROCEDURE  If the spirometer includes an indicator to show your best effort, your nurse or respiratory therapist will set it to a desired goal. If possible, sit up straight or lean slightly forward. Try not to slouch. Hold the incentive spirometer in an upright position. INSTRUCTIONS FOR USE  Sit on the edge of your bed if possible, or sit up as far as you can in bed or on a chair. Hold the  incentive spirometer in an upright position. Breathe out normally. Place the mouthpiece in your mouth and seal your lips tightly around it. Breathe in slowly and as deeply as possible, raising the piston or the ball toward the top of the column. Hold your breath for 3-5 seconds or for as long as possible. Allow the piston or ball to fall to the bottom of the column. Remove the mouthpiece from your mouth and breathe out normally. Rest for a few seconds and repeat Steps 1 through 7 at least 10 times every 1-2 hours when you are awake. Take your time and take a few normal breaths between deep breaths. The spirometer may include an indicator to show your best  effort. Use the indicator as a goal to work toward during each repetition. After each set of 10 deep breaths, practice coughing to be sure your lungs are clear. If you have an incision (the cut made at the time of surgery), support your incision when coughing by placing a pillow or rolled up towels firmly against it. Once you are able to get out of bed, walk around indoors and cough well. You may stop using the incentive spirometer when instructed by your caregiver.  RISKS AND COMPLICATIONS Take your time so you do not get dizzy or light-headed. If you are in pain, you may need to take or ask for pain medication before doing incentive spirometry. It is harder to take a deep breath if you are having pain. AFTER USE Rest and breathe slowly and easily. It can be helpful to keep track of a log of your progress. Your caregiver can provide you with a simple table to help with this. If you are using the spirometer at home, follow these instructions: SEEK MEDICAL CARE IF:  You are having difficultly using the spirometer. You have trouble using the spirometer as often as instructed. Your pain medication is not giving enough relief while using the spirometer. You develop fever of 100.5 F (38.1 C) or higher. SEEK IMMEDIATE MEDICAL CARE IF:  You cough up bloody sputum that had not been present before. You develop fever of 102 F (38.9 C) or greater. You develop worsening pain at or near the incision site. MAKE SURE YOU:  Understand these instructions. Will watch your condition. Will get help right away if you are not doing well or get worse. Document Released: 07/19/2006 Document Revised: 05/31/2011 Document Reviewed: 09/19/2006   WHAT IS A BLOOD TRANSFUSION? Blood Transfusion Information  A transfusion is the replacement of blood or some of its parts. Blood is made up of multiple cells which provide different functions. Red blood cells carry oxygen and are used for blood loss replacement. White  blood cells fight against infection. Platelets control bleeding. Plasma helps clot blood. Other blood products are available for specialized needs, such as hemophilia or other clotting disorders. BEFORE THE TRANSFUSION  Who gives blood for transfusions?  Healthy volunteers who are fully evaluated to make sure their blood is safe. This is blood bank blood. Transfusion therapy is the safest it has ever been in the practice of medicine. Before blood is taken from a donor, a complete history is taken to make sure that person has no history of diseases nor engages in risky social behavior (examples are intravenous drug use or sexual activity with multiple partners). The donor's travel history is screened to minimize risk of transmitting infections, such as malaria. The donated blood is tested for signs of infectious diseases, such as HIV and hepatitis.  The blood is then tested to be sure it is compatible with you in order to minimize the chance of a transfusion reaction. If you or a relative donates blood, this is often done in anticipation of surgery and is not appropriate for emergency situations. It takes many days to process the donated blood. RISKS AND COMPLICATIONS Although transfusion therapy is very safe and saves many lives, the main dangers of transfusion include:  Getting an infectious disease. Developing a transfusion reaction. This is an allergic reaction to something in the blood you were given. Every precaution is taken to prevent this. The decision to have a blood transfusion has been considered carefully by your caregiver before blood is given. Blood is not given unless the benefits outweigh the risks. AFTER THE TRANSFUSION Right after receiving a blood transfusion, you will usually feel much better and more energetic. This is especially true if your red blood cells have gotten low (anemic). The transfusion raises the level of the red blood cells which carry oxygen, and this usually causes  an energy increase. The nurse administering the transfusion will monitor you carefully for complications. HOME CARE INSTRUCTIONS  No special instructions are needed after a transfusion. You may find your energy is better. Speak with your caregiver about any limitations on activity for underlying diseases you may have. SEEK MEDICAL CARE IF:  Your condition is not improving after your transfusion. You develop redness or irritation at the intravenous (IV) site. SEEK IMMEDIATE MEDICAL CARE IF:  Any of the following symptoms occur over the next 12 hours: Shaking chills. You have a temperature by mouth above 102 F (38.9 C), not controlled by medicine. Chest, back, or muscle pain. People around you feel you are not acting correctly or are confused. Shortness of breath or difficulty breathing. Dizziness and fainting. You get a rash or develop hives. You have a decrease in urine output. Your urine turns a dark color or changes to pink, red, or brown. Any of the following symptoms occur over the next 10 days: You have a temperature by mouth above 102 F (38.9 C), not controlled by medicine. Shortness of breath. Weakness after normal activity. The white part of the eye turns yellow (jaundice). You have a decrease in the amount of urine or are urinating less often. Your urine turns a dark color or changes to pink, red, or brown. Document Released: 03/05/2000 Document Revised: 05/31/2011 Document Reviewed: 10/23/2007 [facility] Patient Information 2014 [place], [place].

## 2024-04-10 NOTE — Progress Notes (Signed)
 COVID Vaccine received:  []  No [x]  Yes Date of any COVID positive Test in last 90 days: no PCP - Garnette Lukes MD Cardiologist - Medford Cash MD Electrophys.GLENWOOD Eulas Furbish MD  Chest x-ray -  EKG -   Stress Test -  ECHO - 03/07/23 Epic Cardiac Cath -   Bowel Prep - [x]  No  []   Yes ______  Pacemaker / ICD device [x]  No []  Yes   Spinal Cord Stimulator:[x]  No []  Yes       History of Sleep Apnea? [x]  No []  Yes   CPAP used?- [x]  No []  Yes    Does the patient monitor blood sugar?          [x]  No []  Yes  []  N/A  Patient has: [x]  NO Hx DM   []  Pre-DM                 []  DM1  []   DM2 Does patient have a [employer] or Dexacom? [x]  No []  Yes   Fasting Blood Sugar Ranges-  Checks Blood Sugar _____ times a day  GLP1 agonist / usual dose - no GLP1 instructions:  SGLT-2 inhibitors / usual dose - no SGLT-2 instructions:   Blood Thinner / Instructions:no Aspirin Instructions:ASA 81mg  - Last dose was 04/11/24  Comments:   Activity level: Patient is unable to climb a flight of stairs without difficulty; [x]  No CP  [x]  No SOB, but would have _Knee dysfunction__   Patient can  perform ADLs without assistance.  Uses a walker. Anesthesia review: HTN, Hx. Stroke, cerebral aneurysm, carotid dissection 1988, PVC's, SVT, mild mitral regurg.  Patient denies shortness of breath, fever, cough and chest pain at PAT appointment.  Patient verbalized understanding and agreement to the Pre-Surgical Instructions that were given to them at this PAT appointment. Patient was also educated of the need to review these PAT instructions again prior to his/her surgery.I reviewed the appropriate phone numbers to call if they have any and questions or concerns.

## 2024-04-12 ENCOUNTER — Other Ambulatory Visit: Payer: Self-pay

## 2024-04-12 ENCOUNTER — Encounter (HOSPITAL_COMMUNITY): Payer: Self-pay

## 2024-04-12 ENCOUNTER — Encounter (HOSPITAL_COMMUNITY)
Admission: RE | Admit: 2024-04-12 | Discharge: 2024-04-12 | Disposition: A | Discharge status: Home / Self Care | Source: Ambulatory Visit | Attending: Orthopaedic Surgery | Admitting: Orthopaedic Surgery

## 2024-04-12 VITALS — BP 135/76 | HR 100 | Temp 98.0°F | Resp 16 | Ht 66.0 in | Wt 110.0 lb

## 2024-04-12 DIAGNOSIS — Z01810 Encounter for preprocedural cardiovascular examination: Secondary | ICD-10-CM | POA: Diagnosis present

## 2024-04-12 DIAGNOSIS — I10 Essential (primary) hypertension: Secondary | ICD-10-CM | POA: Insufficient documentation

## 2024-04-12 DIAGNOSIS — Z01818 Encounter for other preprocedural examination: Secondary | ICD-10-CM | POA: Insufficient documentation

## 2024-04-12 DIAGNOSIS — Z01812 Encounter for preprocedural laboratory examination: Secondary | ICD-10-CM | POA: Diagnosis present

## 2024-04-12 LAB — BASIC METABOLIC PANEL WITH GFR
Anion gap: 10 (ref 5–15)
BUN: 22 mg/dL (ref 8–23)
CO2: 26 mmol/L (ref 22–32)
Calcium: 9.9 mg/dL (ref 8.9–10.3)
Chloride: 102 mmol/L (ref 98–111)
Creatinine, Ser: 0.64 mg/dL (ref 0.44–1.00)
GFR, Estimated: >60 mL/min
Glucose, Bld: 87 mg/dL (ref 70–99)
Potassium: 3.9 mmol/L (ref 3.5–5.1)
Sodium: 138 mmol/L (ref 135–145)

## 2024-04-12 LAB — CBC
HCT: 39.9 % (ref 36.0–46.0)
Hemoglobin: 12.2 g/dL (ref 12.0–15.0)
MCH: 28.1 pg (ref 26.0–34.0)
MCHC: 30.6 g/dL (ref 30.0–36.0)
MCV: 91.9 fL (ref 80.0–100.0)
Platelets: 333 K/uL (ref 150–400)
RBC: 4.34 MIL/uL (ref 3.87–5.11)
RDW: 13.3 % (ref 11.5–15.5)
WBC: 6.7 K/uL (ref 4.0–10.5)
nRBC: 0 % (ref 0.0–0.2)

## 2024-04-12 LAB — SURGICAL PCR SCREEN
MRSA, PCR: NEGATIVE
Staphylococcus aureus: NEGATIVE

## 2024-04-14 ENCOUNTER — Encounter (HOSPITAL_COMMUNITY): Payer: Self-pay

## 2024-04-14 NOTE — Progress Notes (Signed)
 " Case: [id number] Date/Time: 04/20/24 1100   Procedure: ARTHROPLASTY, KNEE, TOTAL (Left: Knee)   Anesthesia type: Spinal   Diagnosis: Primary osteoarthritis of left knee [M17.12]   Pre-op diagnosis: osteoarthritis left knee   Location: [facility] ROOM 10 / [facility]   Surgeons: Vernetta Lonni GRADE, MD       DISCUSSION: Lori [name] is an 84 yo female with PMH of HTN, SVT, mitral regurgitation, hx of carotid artery dissection (1988), hx of CVA, Horner syndrome, Meniere disease, GERD with Barretts esophagus, breast cancer s/p R lumpectomy and radiation (2022), PMR, anxiety, arthritis  Patient followed by Cardiology for palpitations/SVT and mild-mod MR. She underwent stress testing in 01/2023 which was low risk without ischemia. Echo in 02/2023 showed normal LVEF 50-55%, grade I DD, mild-mod MR. She was seen by EP on 04/07/23. Noted to be doing well with rare palpitations on metoprolol . Advised to continue and f/u in 3 months but has missed f/u.  Last seen by PCP on 02/14/24. Having issues with chronic dizziness of unclear etiology. Neurology and Cardiology f/u offered but patient has declined. Patient reported dizziness has overall improved. PCP did not seem to think it was a cardiac issue.  she may want their opinion before potential surgery at her age***   VS: BP 135/76   Pulse 100   Temp 36.7 C (Oral)   Resp 16   Ht 5' 6 (1.676 m)   Wt 49.9 kg   SpO2 98%   BMI 17.75 kg/m   PROVIDERS: Katrinka Garnette KIDD, MD Cardiologist - Medford Cash MD Electrophys.GLENWOOD Eulas Furbish MD  LABS: Labs reviewed: Acceptable for surgery. (all labs ordered are listed, but only abnormal results are displayed)  Labs Reviewed  SURGICAL PCR SCREEN  CBC  BASIC METABOLIC PANEL WITH GFR  TYPE AND SCREEN     IMAGES:   EKG 04/12/24:  NSR RAE   Echo 03/07/2023:  IMPRESSIONS    1. Left ventricular ejection fraction, by estimation, is 50 to 55%. The left ventricle has low normal function. The left  ventricle has no regional wall motion abnormalities. Left ventricular diastolic parameters are consistent with Grade I diastolic dysfunction (impaired relaxation).  2. Right ventricular systolic function is normal. The right ventricular size is normal. There is normal pulmonary artery systolic pressure. The estimated right ventricular systolic pressure is 19.8 mmHg.  3. The mitral valve is degenerative. Mild to moderate mitral valve regurgitation. No evidence of mitral stenosis.  4. The aortic valve is tricuspid. Aortic valve regurgitation is trivial. Aortic valve sclerosis is present, with no evidence of aortic valve stenosis.  5. The inferior vena cava is normal in size with greater than 50% respiratory variability, suggesting right atrial pressure of 3 mmHg.  Stress test 02/14/2023:    The study is normal. The study is low risk.   No ST deviation was noted.   LV perfusion is normal. There is no evidence of ischemia. There is no evidence of infarction.   Left ventricular function is normal. Nuclear stress EF: 69%. The left ventricular ejection fraction is hyperdynamic (>65%). End diastolic cavity size is normal. End systolic cavity size is normal.   Prior study not available for comparison. Past Medical History:  Diagnosis Date   Allergy    Anemia    past hx of anemia   Aneurysm    pseudo-aneurym of carotid arteries per pt   Anxiety    Aortic atherosclerosis    Arthritis    knee- DJD    Barrett's esophagus  Breast cancer (HCC)    right breast IDC   Burning mouth syndrome    Dr Carlie 11-2016 - no smell or taste x 4 yrs per pt    Cataract    bilateral    Clotting disorder 1988   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    Eczema    Family history of breast cancer    Family history of kidney cancer    Family history of multiple myeloma    Family history of thyroid  cancer    GERD (gastroesophageal reflux disease)    Headache(784.0)    History of IBS    History  of kidney stones    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    Hyperlipidemia    on medication   Hypertension    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   Low back pain    Meniere disease    Neuromuscular disorder (HCC)    raynaud's   Osteopenia    PMR (polymyalgia rheumatica)    Stroke (HCC) 1988   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 carotid artery dissection    Tubular adenoma of colon 02/2013   Varicose veins with inflammation    upper and lower per pt    Vasculitis     Past Surgical History:  Procedure Laterality Date   APPENDECTOMY  1957   arthroscopic knee  2009   left knee/ torn meniscus   BREAST LUMPECTOMY WITH RADIOACTIVE SEED LOCALIZATION Right 11/05/2020   Procedure: RIGHT BREAST LUMPECTOMY WITH RADIOACTIVE SEED LOCALIZATION;  Surgeon: Vanderbilt Ned, MD;  Location: [facility];  Service: General;  Laterality: Right;   BREAST SURGERY     BUNIONECTOMY Right 1998   with other foot surgery    carotid artery disection  1988   Carotid Artery Dissection   CATARACT EXTRACTION, BILATERAL  07-18-2017,08-08-2017   CESAREAN SECTION  1988   1 time   COLONOSCOPY  2019   last 2019   DILATION AND CURETTAGE OF UTERUS  2004   EYE SURGERY     EYE SURGERY  09/25/2021   lowere lid of left eye   POLYPECTOMY     POPLITEAL SYNOVIAL CYST EXCISION     left leg   TONSILLECTOMY  1957   UPPER GASTROINTESTINAL ENDOSCOPY     last 2018    MEDICATIONS:  acetaminophen (TYLENOL) 650 MG CR tablet   amLODipine  (NORVASC ) 2.5 MG tablet   aspirin 81 MG tablet   atorvastatin  (LIPITOR) 20 MG tablet   augmented betamethasone dipropionate (DIPROLENE-AF) 0.05 % cream   cholecalciferol (VITAMIN D3) 25 MCG (1000 UNIT) tablet   cyanocobalamin  1000 MCG tablet   Emollient (CETAPHIL) cream   letrozole  (FEMARA ) 2.5 MG tablet   loratadine (CLARITIN) 10 MG tablet   metoprolol  succinate (TOPROL -XL) 25 MG 24 hr tablet   montelukast  (SINGULAIR ) 10 MG tablet   OVER THE COUNTER MEDICATION   pantoprazole   (PROTONIX ) 40 MG tablet   No current facility-administered medications for this encounter.   Burnard CHRISTELLA Odis DEVONNA [facility] Surgical Short Stay/Anesthesiology [facility] Phone [phone] 04/14/2024 9:00 PM        "

## 2024-04-16 ENCOUNTER — Encounter: Payer: Self-pay | Admitting: Orthopaedic Surgery

## 2024-04-17 ENCOUNTER — Telehealth (HOSPITAL_BASED_OUTPATIENT_CLINIC_OR_DEPARTMENT_OTHER): Payer: Self-pay

## 2024-04-17 NOTE — Telephone Encounter (Signed)
"  ° °  Pre-operative Risk Assessment    Patient Name: Lori [name]  DOB: March 30, 1940 MRN: [id number]   Date of last office visit: 04/07/23 with Mealor  Date of next office visit: NA  Request for Surgical Clearance    Procedure:  Left total knee arthroplasty  Date of Surgery:  Clearance 04/20/24                                  Surgeon:  Dr. Vernetta  Surgeon's Group or Practice Name:  [facility] Phone number:  [phone] Fax number:  [phone]    Type of Clearance Requested:   - Medical  - Pharmacy:  Hold Aspirin not indicated   Type of Anesthesia:  Spinal +Block   Additional requests/questions:    [name]   04/17/2024, 4:33 PM   "

## 2024-04-18 NOTE — Care Plan (Signed)
 Ortho Bundle Case Management Note  Patient Details  Name: Lori [name] MRN: [id number] Date of Birth: [birth year]  Maralee RNCM call to patient today and spoke with her and during separate phone calls to review her upcoming Left total knee arthroplasty with Dr. Vernetta on 04/20/24 at [facility]. She is agreeable to case management. She does live alone and spoke with son, who states family will be able to assist as needed. We discuss rehab vs. home and patient would rather go home with family assisting. She does have a RW already. Anticipate HHPT will be needed after a short hospital stay. Referral made to [facility] after choice provided. Reviewed post op care instructions and copy sent via email to patient's son, Hosey. Will continue to follow for needs.                  DME Arranged:   (Patient reports she has RW already) DME Agency:     HH Arranged:  PT HH Agency:  [facility]  Additional Comments: Please contact me with any questions of if this plan should need to change.  Tylene Ned, RN, BSN, [employer]  [phone] 04/18/2024, 4:56 PM

## 2024-04-18 NOTE — Telephone Encounter (Signed)
 Called patient, NA, left a message for her to contact our office to schedule an office visit for cardiac clearance.

## 2024-04-18 NOTE — Telephone Encounter (Addendum)
" ° °  Name: Lori [name]  DOB: 1940-03-24  MRN: [id number]  Primary Cardiologist: Lonni Cash, MD  Last seen 04/07/23.  Chart reviewed as part of pre-operative protocol coverage. Because of Deva Earnie Viernes's past medical history and time since last visit, she will require a follow-up in-office visit in order to better assess preoperative cardiovascular risk.  Pre-op covering staff: - Please schedule appointment and call patient to inform them. If patient already had an upcoming appointment within acceptable timeframe, please add pre-op clearance to the appointment notes so provider is aware. - Please contact requesting surgeon's office via preferred method (i.e, phone, fax) to inform them of need for appointment prior to surgery.  Glendia Ferrier, PA-C  04/18/2024, 8:12 AM  "

## 2024-04-18 NOTE — Progress Notes (Unsigned)
 " Cardiology Office Note:    Date:  04/19/2024   ID:  Lori [name], DOB 06-Aug-1940, MRN [id number]  PCP:  Katrinka Garnette KIDD, MD   [facility] Providers Cardiologist:  Lonni Cash, MD Electrophysiologist:  Eulas FORBES Furbish, MD     Referring MD: Katrinka Garnette KIDD, MD   Chief complaint: Preoperative cardiovascular risk evaluation     History of Present Illness:   Lori [name] is a 84 y.o. female with a hx of PMH of HTN, SVT, mitral regurgitation, hx of carotid artery dissection (1988), hx of CVA, Horner syndrome, Meniere disease, GERD with Barretts esophagus, breast cancer s/p R lumpectomy and radiation (2022), PMR, anxiety who is here today for preoperative cardiovascular risk evaluation.  She was evaluated bed Dr. Cash in 01/2023 as a new consult for the evaluation of fatigue, dizziness, throbbing in her chest, without exertional chest pain.  2-week ZIO demonstrated NSR, rare PVCs and SVE, one 6 beat run of NSVT present, no sustained arrhythmia or heart block, no A-fib no A-flutter.  Lexiscan  Myoview  was normal, low risk, no ST deviation noted, no evidence of ischemia/infarction, stress EF 69%.  Echo 03/07/2023: LVEF 50-55%, no RWMA, G1 DD, RVSP 19.8 mmHg, mild-moderate mitral valve regurgitation, trivial AV regurgitation.  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  She stopped her Cardizem .  Was last seen by Dr. Cash with general cardiology 03/11/2023, was continuing to have PVCs and dizziness at that time.  It was suspected her symptoms were coming from short runs of SVT and occasional PVCs.  She was noted to have had hypotension on beta-blockers and preferred not to use those, but did try low-dose Toprol  at this visit with the EP referral.  EP did not believe she was a good candidate for antiarrhythmic drugs or ablation.  Toprol  appeared to reduce frequency and severity of symptoms, magnesium  supplement was  added.  Patient has an upcoming left total knee arthroplasty scheduled for 04/20/2024 with Dr. Vernetta of [facility] [place], with plans for spinal anesthesia + block.  She was placed on my schedule for today for preoperative cardiovascular risk evaluation.  Patient presents with her daughter in law, doing well from a cardiac standpoint. She denies chest pain, palpitations, dyspnea, orthopnea, n, v, dark/tarry/bloody stools, hematuria, dizziness, syncope, edema, weight gain.  She has been performing exercises from PT daily since October without any cardiac symptoms. Reports her palpitations from prior visits have completely resolved while on the metoprolol . States her husband passed last year, and up until his passing was busy trying to care for him. Reports most of her physical limitations are related to her bilateral knee pain. Can do light work around the house. Uses a walker to ambulate. States she was walking five miles a day around a year ago before her knee pain progressed to this point. She can't carry in heavy groceries as she would have to ascend stairs, but she does sweep and vacuum if necessary.   ROS:   Please see the history of present illness.    All other systems reviewed and are negative.     Past Medical History:  Diagnosis Date   Allergy    Anemia    past hx of anemia   Aneurysm    pseudo-aneurym of carotid arteries per pt   Anxiety    Aortic atherosclerosis    Arthritis    knee- DJD    Barrett's esophagus    Breast  cancer [facility])    right breast IDC   Burning mouth syndrome    Dr Carlie 11-2016 - no smell or taste x 4 yrs per pt    Cataract    bilateral    Clotting disorder 1988   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    Eczema    Family history of breast cancer    Family history of kidney cancer    Family history of multiple myeloma    Family history of thyroid  cancer    GERD (gastroesophageal reflux disease)    Headache(784.0)    History of IBS     History of kidney stones    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    Hyperlipidemia    on medication   Hypertension    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   Low back pain    Meniere disease    Neuromuscular disorder (HCC)    raynaud's   Osteopenia    PMR (polymyalgia rheumatica)    Stroke (HCC) 1988   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 carotid artery dissection    Tubular adenoma of colon 02/2013   Varicose veins with inflammation    upper and lower per pt    Vasculitis     Past Surgical History:  Procedure Laterality Date   APPENDECTOMY  1957   arthroscopic knee  2009   left knee/ torn meniscus   BREAST LUMPECTOMY WITH RADIOACTIVE SEED LOCALIZATION Right 11/05/2020   Procedure: RIGHT BREAST LUMPECTOMY WITH RADIOACTIVE SEED LOCALIZATION;  Surgeon: Vanderbilt Ned, MD;  Location: [facility];  Service: General;  Laterality: Right;   BREAST SURGERY     BUNIONECTOMY Right 1998   with other foot surgery    carotid artery disection  1988   Carotid Artery Dissection   CATARACT EXTRACTION, BILATERAL  07-18-2017,08-08-2017   CESAREAN SECTION  1988   1 time   COLONOSCOPY  2019   last 2019   DILATION AND CURETTAGE OF UTERUS  2004   EYE SURGERY     EYE SURGERY  09/25/2021   lowere lid of left eye   POLYPECTOMY     POPLITEAL SYNOVIAL CYST EXCISION     left leg   TONSILLECTOMY  1957   UPPER GASTROINTESTINAL ENDOSCOPY     last 2018    Current Medications: Active Medications[1]   Allergies:   Azithromycin , Bacitracin-polymyxin b, Diltiazem , Hydrocodone-acetaminophen, Klonopin  [clonazepam ], Neomycin, Nitrofurantoin , Penicillins, Pneumococcal vaccine polyvalent, Pneumovax [pneumococcal polysaccharide vaccine], Triamcinolone , Bacitracin, Hydrocodone, Other, and Pneumococcal vaccine   Social History   Socioeconomic History   Marital status: Married    Spouse name: Not on file   Number of children: 4   Years of education: Not on file   Highest education level: Not on file   Occupational History   Occupation: retired  Tobacco Use   Smoking status: Never   Smokeless tobacco: Never  Vaping Use   Vaping status: Never Used  Substance and Sexual Activity   Alcohol use: No    Alcohol/week: 0.0 standard drinks of alcohol   Drug use: No   Sexual activity: Yes    Birth control/protection: Post-menopausal  Other Topics Concern   Not on file  Social History Narrative   Lives with husband who is also a patient of Dr. Katrinka. Wadie [address] 51, Donnice BREEDING (sees Dr. Katrinka), Lauraine 34. 3 grandkids in 2022   Social Drivers of Health   Tobacco Use: Low Risk (04/19/2024)   Patient History    Smoking Tobacco  Use: Never    Smokeless Tobacco Use: Never    Passive Exposure: Not on file  Financial Resource Strain: Low Risk (10/19/2023)   Overall Financial Resource Strain (CARDIA)    Difficulty of Paying Living Expenses: Not hard at all  Food Insecurity: No Food Insecurity (10/19/2023)   Epic    Worried About Programme Researcher, Broadcasting/film/video in the Last Year: Never true    Ran Out of Food in the Last Year: Never true  Transportation Needs: No Transportation Needs (10/19/2023)   Epic    Lack of Transportation (Medical): No    Lack of Transportation (Non-Medical): No  Physical Activity: Inactive (10/19/2023)   Exercise Vital Sign    Days of Exercise per Week: 0 days    Minutes of Exercise per Session: 0 min  Stress: No Stress Concern Present (10/19/2023)   [employer] of Occupational Health - Occupational Stress Questionnaire    Feeling of Stress: Not at all  Social Connections: Moderately Integrated (10/19/2023)   Social Connection and Isolation Panel    Frequency of Communication with Friends and Family: More than three times a week    Frequency of Social Gatherings with Friends and Family: More than three times a week    Attends Religious Services: More than 4 times per year    Active Member of Clubs or Organizations: No    Attends Banker Meetings: Never     Marital Status: Married  Depression (PHQ2-9): Low Risk (02/14/2024)   Depression (PHQ2-9)    PHQ-2 Score: 2  Recent Concern: Depression (PHQ2-9) - Medium Risk (01/16/2024)   Depression (PHQ2-9)    PHQ-2 Score: 9  Alcohol Screen: Low Risk (10/19/2023)   Alcohol Screen    Last Alcohol Screening Score (AUDIT): 0  Housing: Unknown (10/19/2023)   Epic    Unable to Pay for Housing in the Last Year: No    Number of Times Moved in the Last Year: Not on file    Homeless in the Last Year: No  Utilities: Not At Risk (10/19/2023)   Epic    Threatened with loss of utilities: No  Health Literacy: Adequate Health Literacy (10/19/2023)   B1300 Health Literacy    Frequency of need for help with medical instructions: Never     Family History: The patient's family history includes Alzheimer's disease in her brother; Breast cancer in an other family member; Breast cancer (age of onset: 4) in her cousin; Cancer in her cousin; Cervical cancer in an other family member; Kidney cancer in her brother; Kidney disease in her brother; Lung cancer in her sister; Multiple myeloma (age of onset: 13) in her father; Stroke (age of onset: 56) in her brother; Thyroid  cancer (age of onset: 60) in her sister. There is no history of Colon cancer, Esophageal cancer, Rectal cancer, Stomach cancer, or Colon polyps.  EKGs/Labs/Other Studies Reviewed:    The following studies were reviewed today:       Recent Labs: 02/14/2024: ALT 18; TSH 0.84 04/12/2024: BUN 22; Creatinine, Ser 0.64; Hemoglobin 12.2; Platelets 333; Potassium 3.9; Sodium 138  Recent Lipid Panel    Component Value Date/Time   CHOL 169 10/27/2023 1011   TRIG 48.0 10/27/2023 1011   HDL 82.40 10/27/2023 1011   CHOLHDL 2 10/27/2023 1011   VLDL 9.6 10/27/2023 1011   LDLCALC 77 10/27/2023 1011   LDLCALC 63 12/27/2019 1141   LDLDIRECT 74.0 02/06/2016 0811     Risk Assessment/Calculations:  Physical Exam:    VS:  BP (!) 144/82   Pulse 92    Ht 5' 6 (1.676 m)   Wt 113 lb (51.3 kg)   SpO2 96%   BMI 18.24 kg/m        Wt Readings from Last 3 Encounters:  04/19/24 113 lb (51.3 kg)  04/12/24 110 lb (49.9 kg)  03/01/24 111 lb (50.3 kg)     GEN:  Well nourished, well developed in no acute distress HEENT: Normal NECK:  No carotid bruits CARDIAC:  S1-S2 normal, RRR, no murmurs, rubs, gallops RESPIRATORY:  Clear to auscultation without rales, wheezing or rhonchi  MUSCULOSKELETAL:  No edema; No deformity  SKIN: Warm and dry NEUROLOGIC:  Alert and oriented x 3 PSYCHIATRIC:  Normal affect       Assessment & Plan Preoperative cardiovascular examination According to the Revised Cardiac Risk Index (RCRI), her Perioperative Risk of Major Cardiac Event is (%): 0.9  Her Functional Capacity in METs is: 4.06 according to the Duke Activity Status Index (DASI).  Therefore, based on ACC/AHA guidelines patient would be at moderate but acceptable risk for the planned procedure without further cardiovascular testing. I will route this recommendation to the requesting party via Epic fax function.  Aspirin prescribed by a noncardiology provider therefore recommendations for holding deferred to prescribing provider.   Patient instructed to notify the office if new symptoms occur prior to the planned procedure. Palpitations Resolved on metoprolol . Continue metoprolol  succinate 25 mg Nonrheumatic mitral valve regurgitation Mild to moderate by echo in December 2024.  Continues to be asymptomatic Due to be repeated at end of this year, can order at follow up  Disposition: Patient requesting follow up with Dr. Verlin, as her previous visit was lost to follow up with the death of her husband. Will have patient schedule follow up with him in 4-5 months prior to next knee surgery. Can be sooner if needed. Proceed to ED with any new or worsening symptoms            Medication Adjustments/Labs and Tests Ordered: Current medicines are  reviewed at length with the patient today.  Concerns regarding medicines are outlined above.  No orders of the defined types were placed in this encounter.  No orders of the defined types were placed in this encounter.   Patient Instructions  Medication Instructions:  No medication changes were made during today's visit.  Please call the office if your notice any cardiac changes before your surgery tomorrow.  *If you need a refill on your cardiac medications before your next appointment, please call your pharmacy*  Lab Work: No labs were ordered during today's visit.  If you have labs (blood work) drawn today and your tests are completely normal, you will receive your results only by: MyChart Message (if you have MyChart) OR A paper copy in the mail If you have any lab test that is abnormal or we need to change your treatment, we will call you to review the results.  Testing/Procedures: No procedures were ordered during today's visit.   Follow-Up: At [facility], you and your health needs are our priority.  As part of our continuing mission to provide you with exceptional heart care, our providers are all part of one team.  This team includes your primary Cardiologist (physician) and Advanced Practice Providers or APPs (Physician Assistants and Nurse Practitioners) who all work together to provide you with the care you need, when you need it.  Your next appointment:  5 month(s)  Provider:   Lonni Cash, MD    We recommend signing up for the patient portal called MyChart.  Sign up information is provided on this After Visit Summary.  MyChart is used to connect with patients for Virtual Visits (Telemedicine).  Patients are able to view lab/test results, encounter notes, upcoming appointments, etc.  Non-urgent messages can be sent to your provider as well.   To learn more about what you can do with MyChart, go to [email or web address].   Other  Instructions            Signed, Miriam FORBES Shams, NP  04/19/2024 8:29 PM    [facility]     [1]  Current Meds  Medication Sig   acetaminophen (TYLENOL) 650 MG CR tablet Take 650 mg by mouth every 8 (eight) hours as needed for pain.   amLODipine  (NORVASC ) 2.5 MG tablet TAKE 1 TABLET BY MOUTH DAILY (Patient taking differently: Take 2.5 mg by mouth at bedtime.)   aspirin 81 MG tablet Take 81 mg by mouth daily. Evening   atorvastatin  (LIPITOR) 20 MG tablet Take 1 tablet (20 mg total) by mouth 2 (two) times a week. (Patient taking differently: Take 20 mg by mouth once a week.)   augmented betamethasone dipropionate (DIPROLENE-AF) 0.05 % cream Apply topically as needed.   cholecalciferol (VITAMIN D3) 25 MCG (1000 UNIT) tablet Take 1,000 Units by mouth daily. Take 1 tablet daily   cyanocobalamin  1000 MCG tablet Take 1,000 mcg by mouth once a week. (Patient taking differently: Take 1,000 mcg by mouth once a week. wednesdays)   Emollient (CETAPHIL) cream Apply 1 Application topically daily as needed.   letrozole  (FEMARA ) 2.5 MG tablet TAKE 1 TABLET BY MOUTH EVERY DAY   loratadine (CLARITIN) 10 MG tablet Take 10 mg by mouth daily.   metoprolol  succinate (TOPROL -XL) 25 MG 24 hr tablet Take 1 tablet (25 mg total) by mouth daily.   montelukast  (SINGULAIR ) 10 MG tablet TAKE 1 TABLET BY MOUTH EVERY EVENING (Patient taking differently: Take 10 mg by mouth at bedtime.)   OVER THE COUNTER MEDICATION Take 5 mLs by mouth daily as needed (heartburn). [place]   pantoprazole  (PROTONIX ) 40 MG tablet TAKE 1 TABLET BY MOUTH EVERY DAY (Patient taking differently: Take 40 mg by mouth daily before breakfast.)   "

## 2024-04-18 NOTE — Telephone Encounter (Signed)
 Patient is scheduled for 1/29 with K. Elaine.

## 2024-04-19 ENCOUNTER — Encounter: Payer: Self-pay | Admitting: Emergency Medicine

## 2024-04-19 ENCOUNTER — Ambulatory Visit: Attending: Cardiology | Admitting: Emergency Medicine

## 2024-04-19 VITALS — BP 144/82 | HR 92 | Ht 66.0 in | Wt 113.0 lb

## 2024-04-19 DIAGNOSIS — I340 Nonrheumatic mitral (valve) insufficiency: Secondary | ICD-10-CM | POA: Diagnosis not present

## 2024-04-19 DIAGNOSIS — R002 Palpitations: Secondary | ICD-10-CM | POA: Diagnosis not present

## 2024-04-19 DIAGNOSIS — Z01810 Encounter for preprocedural cardiovascular examination: Secondary | ICD-10-CM

## 2024-04-19 NOTE — H&P (Signed)
 TOTAL KNEE ADMISSION H&P  Patient is being admitted for left total knee arthroplasty.  Subjective:  Chief Complaint:left knee pain.  HPI: Lori [name], 84 y.o. female, has a history of pain and functional disability in the left knee due to arthritis and has failed non-surgical conservative treatments for greater than 12 weeks to includecorticosteriod injections, flexibility and strengthening excercises, use of assistive devices, and activity modification.  Onset of symptoms was gradual, starting many years ago with gradually worsening course since that time. The patient noted no past surgery on the left knee(s).  Patient currently rates pain in the left knee(s) at 10 out of 10 with activity. Patient has night pain, worsening of pain with activity and weight bearing, pain that interferes with activities of daily living, pain with passive range of motion, crepitus, and joint swelling.  Patient has evidence of subchondral sclerosis, periarticular osteophytes, and joint space narrowing by imaging studies. There is no active infection.  Patient Active Problem List   Diagnosis Date Noted   Unilateral primary osteoarthritis, right knee 03/01/2024   Primary osteoarthritis of both knees 01/26/2024   Hyperglycemia 01/06/2022   Cerebral arterial aneurysm 11/02/2021   History of stroke 10/22/2021   Bilateral sensorineural hearing loss 09/14/2021   Imbalance 09/14/2021   Abnormal ear sensation, right 02/27/2021   Genetic testing 11/10/2020   Family history of kidney cancer 10/22/2020   Family history of thyroid  cancer 10/22/2020   Family history of multiple myeloma 10/22/2020   Family history of breast cancer 10/22/2020   Malignant neoplasm of lower-inner quadrant of right breast of female, estrogen receptor positive (HCC) 10/17/2020   Parotid adenoma 06/26/2020   Subjective tinnitus of both ears 06/05/2020   Vertigo 01/22/2020   IBS (irritable bowel syndrome) 06/26/2019   Unilateral primary  osteoarthritis, left knee 09/15/2017   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   Anosmia 03/30/2017   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   Aortic atherosclerosis 02/11/2016   Hypertension 11/29/2013   Eczema 11/29/2013   Hyperlipidemia 11/29/2013   Barrett's esophagus 08/24/2013   GERD (gastroesophageal reflux disease) 05/16/2013   Vitamin D  deficiency 01/29/2012   Solitary pulmonary nodule 06/08/2011   HIATAL HERNIA WITH REFLUX 02/06/2010   DEGENERATIVE JOINT DISEASE, KNEE 03/14/2008   VARICOSE VEINS LOWER EXTREMITIES W/INFLAMMATION 03/09/2007   Allergic rhinitis 03/09/2007   ACTINIC KERATOSIS, FOREHEAD, LEFT 03/09/2007   Headache(784.0) 03/09/2007   MENIERE'S DISEASE 09/22/2006   RAYNAUD'S DISEASE 09/22/2006   Osteoporosis 09/22/2006   Past Medical History:  Diagnosis Date   Allergy    Anemia    past hx of anemia   Aneurysm    pseudo-aneurym of carotid arteries per pt   Anxiety    Aortic atherosclerosis    Arthritis    knee- DJD    Barrett's esophagus    Breast cancer (HCC)    right breast IDC   Burning mouth syndrome    Dr Carlie 11-2016 - no smell or taste x 4 yrs per pt    Cataract    bilateral    Clotting disorder 1988   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    Eczema    Family history of breast cancer    Family history of kidney cancer    Family history of multiple myeloma    Family history of thyroid  cancer    GERD (gastroesophageal reflux disease)    Headache(784.0)    History of IBS    History of kidney stones    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  Hyperlipidemia    on medication   Hypertension    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   Low back pain    Meniere disease    Neuromuscular disorder (HCC)    raynaud's   Osteopenia    PMR (polymyalgia rheumatica)    Stroke [facility]) 1988   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 carotid artery dissection    Tubular adenoma of colon 02/2013   Varicose veins with inflammation    upper and lower per pt    Vasculitis     Past  Surgical History:  Procedure Laterality Date   APPENDECTOMY  1957   arthroscopic knee  2009   left knee/ torn meniscus   BREAST LUMPECTOMY WITH RADIOACTIVE SEED LOCALIZATION Right 11/05/2020   Procedure: RIGHT BREAST LUMPECTOMY WITH RADIOACTIVE SEED LOCALIZATION;  Surgeon: Vanderbilt Ned, MD;  Location: [facility];  Service: General;  Laterality: Right;   BREAST SURGERY     BUNIONECTOMY Right 1998   with other foot surgery    carotid artery disection  1988   Carotid Artery Dissection   CATARACT EXTRACTION, BILATERAL  07-18-2017,08-08-2017   CESAREAN SECTION  1988   1 time   COLONOSCOPY  2019   last 2019   DILATION AND CURETTAGE OF UTERUS  2004   EYE SURGERY     EYE SURGERY  09/25/2021   lowere lid of left eye   POLYPECTOMY     POPLITEAL SYNOVIAL CYST EXCISION     left leg   TONSILLECTOMY  1957   UPPER GASTROINTESTINAL ENDOSCOPY     last 2018    No current facility-administered medications for this encounter.   Current Outpatient Medications  Medication Sig Dispense Refill Last Dose/Taking   acetaminophen (TYLENOL) 650 MG CR tablet Take 650 mg by mouth every 8 (eight) hours as needed for pain.   Taking As Needed   amLODipine  (NORVASC ) 2.5 MG tablet TAKE 1 TABLET BY MOUTH DAILY (Patient taking differently: Take 2.5 mg by mouth at bedtime.) 90 tablet 3 Taking Differently   aspirin 81 MG tablet Take 81 mg by mouth daily. Evening   Taking   atorvastatin  (LIPITOR) 20 MG tablet Take 1 tablet (20 mg total) by mouth 2 (two) times a week. (Patient taking differently: Take 20 mg by mouth once a week.) 26 tablet 3 Taking Differently   cholecalciferol (VITAMIN D3) 25 MCG (1000 UNIT) tablet Take 1,000 Units by mouth daily. Take 1 tablet daily   Taking   cyanocobalamin  1000 MCG tablet Take 1,000 mcg by mouth once a week. (Patient taking differently: Take 1,000 mcg by mouth once a week. wednesdays)   Taking Differently   Emollient (CETAPHIL) cream Apply 1 Application topically  daily as needed.   Taking As Needed   letrozole  (FEMARA ) 2.5 MG tablet TAKE 1 TABLET BY MOUTH EVERY DAY 90 tablet 3 Taking   metoprolol  succinate (TOPROL -XL) 25 MG 24 hr tablet Take 1 tablet (25 mg total) by mouth daily. 90 tablet 3 Taking   montelukast  (SINGULAIR ) 10 MG tablet TAKE 1 TABLET BY MOUTH EVERY EVENING (Patient taking differently: Take 10 mg by mouth at bedtime.) 90 tablet 1 Taking Differently   OVER THE COUNTER MEDICATION Take 5 mLs by mouth daily as needed (heartburn). [place] Honey   Taking As Needed   pantoprazole  (PROTONIX ) 40 MG tablet TAKE 1 TABLET BY MOUTH EVERY DAY (Patient taking differently: Take 40 mg by mouth daily before breakfast.) 90 tablet 1 Taking Differently   augmented betamethasone dipropionate (DIPROLENE-AF) 0.05 % cream Apply topically  as needed.   Not Taking   loratadine (CLARITIN) 10 MG tablet Take 10 mg by mouth daily.      Allergies[1]  Social History   Tobacco Use   Smoking status: Never   Smokeless tobacco: Never  Substance Use Topics   Alcohol use: No    Alcohol/week: 0.0 standard drinks of alcohol    Family History  Problem Relation Age of Onset   Multiple myeloma Father 31   Thyroid  cancer Sister 54       s/p removal. papilary and anaplastic.    Lung cancer Sister    Kidney disease Brother        cancer- removed   Kidney cancer Brother        dx early 73s   Alzheimer's disease Brother    Stroke Brother 30   Breast cancer Cousin 63       paternal first cousin   Cancer Cousin        unknown type, paternal first cousin   Breast cancer Other        mother's first cousin   Cervical cancer Other    Colon cancer Neg Hx    Esophageal cancer Neg Hx    Rectal cancer Neg Hx    Stomach cancer Neg Hx    Colon polyps Neg Hx      Review of Systems  Objective:  Physical Exam Vitals reviewed.  Constitutional:      Appearance: Normal appearance. She is normal weight.  HENT:     Head: Normocephalic and atraumatic.  Eyes:     Extraocular  Movements: Extraocular movements intact.     Pupils: Pupils are equal, round, and reactive to light.  Cardiovascular:     Rate and Rhythm: Normal rate.  Pulmonary:     Effort: Pulmonary effort is normal.     Breath sounds: Normal breath sounds.  Abdominal:     Palpations: Abdomen is soft.  Musculoskeletal:     Cervical back: Normal range of motion and neck supple.     Left knee: Effusion, bony tenderness and crepitus present. Decreased range of motion. Tenderness present over the medial joint line and lateral joint line. Abnormal alignment.  Neurological:     Mental Status: She is alert and oriented to person, place, and time.  Psychiatric:        Behavior: Behavior normal.     Vital signs in last 24 hours: Pulse Rate:  [92] 92 (01/29 1416) BP: (144)/(82) 144/82 (01/29 1416) SpO2:  [96 %] 96 % (01/29 1416) Weight:  [51.3 kg] 51.3 kg (01/29 1416)  Labs:   Estimated body mass index is 18.24 kg/m as calculated from the following:   Height as of 04/19/24: 5' 6 (1.676 m).   Weight as of 04/19/24: 51.3 kg.   Imaging Review Plain radiographs demonstrate severe degenerative joint disease of the left knee(s). The overall alignment ismild varus. The bone quality appears to be good for age and reported activity level.      Assessment/Plan:  End stage arthritis, left knee   The patient history, physical examination, clinical judgment of the provider and imaging studies are consistent with end stage degenerative joint disease of the left knee(s) and total knee arthroplasty is deemed medically necessary. The treatment options including medical management, injection therapy arthroscopy and arthroplasty were discussed at length. The risks and benefits of total knee arthroplasty were presented and reviewed. The risks due to aseptic loosening, infection, stiffness, patella tracking problems, thromboembolic complications and other  imponderables were discussed. The patient acknowledged the  explanation, agreed to proceed with the plan and consent was signed. Patient is being admitted for inpatient treatment for surgery, pain control, PT, OT, prophylactic antibiotics, VTE prophylaxis, progressive ambulation and ADL's and discharge planning. The patient is planning to be discharged home with home health services        [1]  Allergies Allergen Reactions   Azithromycin  Other (See Comments)    Thrush   Bacitracin-Polymyxin B Itching    Burning and runny eye    Diltiazem  Rash   Hydrocodone-Acetaminophen     VOMITING   Klonopin  [Clonazepam ]     -august 27th- took one dose and experienced zombie like sensation- states will never take agian   Neomycin     Unsure of reaction   Nitrofurantoin      Reports tingling on tongue- no swelling   Penicillins     REACTION: Arm swelling   Pneumococcal Vaccine Polyvalent     ARM REDNESS WITH TENDERNESS   Pneumovax [Pneumococcal Polysaccharide Vaccine]    Triamcinolone  Swelling    Lip,tongue and nose burning. Triamcinolone  Cream 0.1%   Bacitracin Other (See Comments)   Hydrocodone Other (See Comments)   Other Other (See Comments)   Pneumococcal Vaccine Other (See Comments)

## 2024-04-19 NOTE — Anesthesia Preprocedure Evaluation (Addendum)
 "                                  Anesthesia Evaluation  Patient identified by MRN, date of birth, ID band Patient awake    Reviewed: Allergy & Precautions, NPO status , Patient's Chart, lab work & pertinent test results  Airway Mallampati: I  TM Distance: >3 FB Neck ROM: Full    Dental  (+) Teeth Intact, Dental Advisory Given   Pulmonary neg pulmonary ROS   Pulmonary exam normal        Cardiovascular hypertension,  Rhythm:Regular Rate:Normal  Echo:   1. Left ventricular ejection fraction, by estimation, is 50 to 55%. The  left ventricle has low normal function. The left ventricle has no regional  wall motion abnormalities. Left ventricular diastolic parameters are  consistent with Grade I diastolic  dysfunction (impaired relaxation).   2. Right ventricular systolic function is normal. The right ventricular  size is normal. There is normal pulmonary artery systolic pressure. The  estimated right ventricular systolic pressure is 19.8 mmHg.   3. The mitral valve is degenerative. Mild to moderate mitral valve  regurgitation. No evidence of mitral stenosis.   4. The aortic valve is tricuspid. Aortic valve regurgitation is trivial.  Aortic valve sclerosis is present, with no evidence of aortic valve  stenosis.   5. The inferior vena cava is normal in size with greater than 50%  respiratory variability, suggesting right atrial pressure of 3 mmHg.     Neuro/Psych  Headaches  Anxiety      Neuromuscular disease CVA    GI/Hepatic Neg liver ROS,GERD  ,,  Endo/Other  negative endocrine ROS    Renal/GU negative Renal ROS     Musculoskeletal  (+) Arthritis ,    Abdominal   Peds  Hematology  (+) Blood dyscrasia, anemia   Anesthesia Other Findings   Reproductive/Obstetrics                              Anesthesia Physical Anesthesia Plan  ASA: 3  Anesthesia Plan: Spinal   Post-op Pain Management: Regional block*   Induction:  Intravenous  PONV Risk Score and Plan: 3 and Ondansetron , Propofol  infusion and Treatment may vary due to age or medical condition  Airway Management Planned: Simple Face Mask and Natural Airway  Additional Equipment: None  Intra-op Plan:   Post-operative Plan:   Informed Consent: I have reviewed the patients History and Physical, chart, labs and discussed the procedure including the risks, benefits and alternatives for the proposed anesthesia with the patient or authorized representative who has indicated his/her understanding and acceptance.       Plan Discussed with: CRNA  Anesthesia Plan Comments: (See PAT note from 1/22  Lab Results      Component                Value               Date                      WBC                      6.7                 04/12/2024  HGB                      12.2                04/12/2024                HCT                      39.9                04/12/2024                MCV                      91.9                04/12/2024                PLT                      333                 04/12/2024           )         Anesthesia Quick Evaluation  "

## 2024-04-19 NOTE — Patient Instructions (Signed)
 Medication Instructions:  No medication changes were made during today's visit.  Please call the office if your notice any cardiac changes before your surgery tomorrow.  *If you need a refill on your cardiac medications before your next appointment, please call your pharmacy*  Lab Work: No labs were ordered during today's visit.  If you have labs (blood work) drawn today and your tests are completely normal, you will receive your results only by: MyChart Message (if you have MyChart) OR A paper copy in the mail If you have any lab test that is abnormal or we need to change your treatment, we will call you to review the results.  Testing/Procedures: No procedures were ordered during today's visit.   Follow-Up: At [facility], you and your health needs are our priority.  As part of our continuing mission to provide you with exceptional heart care, our providers are all part of one team.  This team includes your primary Cardiologist (physician) and Advanced Practice Providers or APPs (Physician Assistants and Nurse Practitioners) who all work together to provide you with the care you need, when you need it.  Your next appointment:   5 month(s)  Provider:   Lonni Cash, MD    We recommend signing up for the patient portal called MyChart.  Sign up information is provided on this After Visit Summary.  MyChart is used to connect with patients for Virtual Visits (Telemedicine).  Patients are able to view lab/test results, encounter notes, upcoming appointments, etc.  Non-urgent messages can be sent to your provider as well.   To learn more about what you can do with MyChart, go to [email or web address].   Other Instructions

## 2024-04-20 ENCOUNTER — Encounter (HOSPITAL_COMMUNITY): Payer: Self-pay | Admitting: Orthopaedic Surgery

## 2024-04-20 ENCOUNTER — Observation Stay (HOSPITAL_COMMUNITY)
Admission: RE | Admit: 2024-04-20 | Discharge: 2024-04-24 | Disposition: A | Source: Ambulatory Visit | Attending: Orthopaedic Surgery | Admitting: Orthopaedic Surgery

## 2024-04-20 ENCOUNTER — Observation Stay (HOSPITAL_COMMUNITY)

## 2024-04-20 ENCOUNTER — Ambulatory Visit (HOSPITAL_COMMUNITY): Admitting: Certified Registered"

## 2024-04-20 ENCOUNTER — Ambulatory Visit (HOSPITAL_COMMUNITY): Payer: Self-pay | Admitting: Medical

## 2024-04-20 ENCOUNTER — Other Ambulatory Visit: Payer: Self-pay

## 2024-04-20 ENCOUNTER — Encounter (HOSPITAL_COMMUNITY): Admission: RE | Payer: Self-pay | Source: Ambulatory Visit

## 2024-04-20 DIAGNOSIS — Z8673 Personal history of transient ischemic attack (TIA), and cerebral infarction without residual deficits: Secondary | ICD-10-CM | POA: Insufficient documentation

## 2024-04-20 DIAGNOSIS — F419 Anxiety disorder, unspecified: Secondary | ICD-10-CM

## 2024-04-20 DIAGNOSIS — Z853 Personal history of malignant neoplasm of breast: Secondary | ICD-10-CM | POA: Insufficient documentation

## 2024-04-20 DIAGNOSIS — M1712 Unilateral primary osteoarthritis, left knee: Principal | ICD-10-CM | POA: Diagnosis present

## 2024-04-20 DIAGNOSIS — I10 Essential (primary) hypertension: Secondary | ICD-10-CM | POA: Diagnosis not present

## 2024-04-20 DIAGNOSIS — Z79899 Other long term (current) drug therapy: Secondary | ICD-10-CM | POA: Insufficient documentation

## 2024-04-20 DIAGNOSIS — Z96652 Presence of left artificial knee joint: Secondary | ICD-10-CM

## 2024-04-20 LAB — ABO/RH: ABO/RH(D): O POS

## 2024-04-20 LAB — TYPE AND SCREEN
ABO/RH(D): O POS
Antibody Screen: NEGATIVE

## 2024-04-20 MED ORDER — FENTANYL CITRATE (PF) 250 MCG/5ML IJ SOLN
INTRAMUSCULAR | Status: DC | PRN
  Administered 2024-04-20: 50 ug via INTRAVENOUS

## 2024-04-20 MED ORDER — ACETAMINOPHEN 10 MG/ML IV SOLN
1000.0000 mg | Freq: Once | INTRAVENOUS | Status: DC | PRN
  Administered 2024-04-20: 1000 mg via INTRAVENOUS

## 2024-04-20 MED ORDER — ONDANSETRON HCL 4 MG PO TABS: 4.0000 mg | ORAL_TABLET | Freq: Four times a day (QID) | ORAL | Status: DC | PRN

## 2024-04-20 MED ORDER — LACTATED RINGERS IV SOLN: INTRAVENOUS | Status: DC

## 2024-04-20 MED ORDER — METHOCARBAMOL 500 MG PO TABS
ORAL_TABLET | ORAL | Status: AC
  Filled 2024-04-20: qty 1

## 2024-04-20 MED ORDER — CEFAZOLIN SODIUM-DEXTROSE 2-4 GM/100ML-% IV SOLN
2.0000 g | Freq: Four times a day (QID) | INTRAVENOUS | Status: AC
  Administered 2024-04-20 (×2): 2 g via INTRAVENOUS
  Filled 2024-04-20 (×2): qty 100

## 2024-04-20 MED ORDER — PHENOL 1.4 % MT LIQD: 1.0000 | OROMUCOSAL | Status: DC | PRN

## 2024-04-20 MED ORDER — ROPIVACAINE HCL 5 MG/ML IJ SOLN
INTRAMUSCULAR | Status: DC | PRN
  Administered 2024-04-20: 20 mL via PERINEURAL

## 2024-04-20 MED ORDER — MENTHOL 3 MG MT LOZG: 1.0000 | LOZENGE | OROMUCOSAL | Status: DC | PRN

## 2024-04-20 MED ORDER — TRANEXAMIC ACID-NACL 1000-0.7 MG/100ML-% IV SOLN
1000.0000 mg | INTRAVENOUS | Status: AC
  Administered 2024-04-20: 1000 mg via INTRAVENOUS
  Filled 2024-04-20: qty 100

## 2024-04-20 MED ORDER — PHENYLEPHRINE HCL-NACL 20-0.9 MG/250ML-% IV SOLN
INTRAVENOUS | Status: DC | PRN
  Administered 2024-04-20: 50 ug/min via INTRAVENOUS

## 2024-04-20 MED ORDER — METHOCARBAMOL 500 MG PO TABS
500.0000 mg | ORAL_TABLET | Freq: Four times a day (QID) | ORAL | Status: DC | PRN
  Administered 2024-04-20 – 2024-04-21 (×3): 500 mg via ORAL
  Filled 2024-04-20 (×3): qty 1

## 2024-04-20 MED ORDER — BUPIVACAINE-EPINEPHRINE 0.25% -1:200000 IJ SOLN
INTRAMUSCULAR | Status: DC | PRN
  Administered 2024-04-20: 30 mL

## 2024-04-20 MED ORDER — MONTELUKAST SODIUM 10 MG PO TABS
10.0000 mg | ORAL_TABLET | Freq: Every day | ORAL | Status: DC
  Administered 2024-04-20 – 2024-04-23 (×4): 10 mg via ORAL
  Filled 2024-04-20 (×4): qty 1

## 2024-04-20 MED ORDER — METOPROLOL SUCCINATE ER 25 MG PO TB24
25.0000 mg | ORAL_TABLET | Freq: Every day | ORAL | Status: DC
  Administered 2024-04-21 – 2024-04-24 (×4): 25 mg via ORAL
  Filled 2024-04-20 (×4): qty 1

## 2024-04-20 MED ORDER — PROPOFOL 500 MG/50ML IV EMUL
INTRAVENOUS | Status: DC | PRN
  Administered 2024-04-20: 50 ug/kg/min via INTRAVENOUS

## 2024-04-20 MED ORDER — DROPERIDOL 2.5 MG/ML IJ SOLN: 0.6250 mg | Freq: Once | INTRAMUSCULAR | Status: DC | PRN

## 2024-04-20 MED ORDER — 0.9 % SODIUM CHLORIDE (POUR BTL) OPTIME
TOPICAL | Status: DC | PRN
  Administered 2024-04-20: 1000 mL

## 2024-04-20 MED ORDER — SODIUM CHLORIDE 0.9 % IV SOLN: INTRAVENOUS | Status: AC

## 2024-04-20 MED ORDER — AMLODIPINE BESYLATE 5 MG PO TABS
2.5000 mg | ORAL_TABLET | Freq: Every day | ORAL | Status: DC
  Administered 2024-04-20 – 2024-04-23 (×4): 2.5 mg via ORAL
  Filled 2024-04-20 (×4): qty 1

## 2024-04-20 MED ORDER — HYDROMORPHONE HCL 1 MG/ML IJ SOLN
INTRAMUSCULAR | Status: AC
  Filled 2024-04-20: qty 1

## 2024-04-20 MED ORDER — DIPHENHYDRAMINE HCL 12.5 MG/5ML PO ELIX: 12.5000 mg | ORAL_SOLUTION | ORAL | Status: DC | PRN

## 2024-04-20 MED ORDER — METOCLOPRAMIDE HCL 5 MG/ML IJ SOLN: 5.0000 mg | Freq: Three times a day (TID) | INTRAMUSCULAR | Status: DC | PRN

## 2024-04-20 MED ORDER — OXYCODONE HCL 5 MG PO TABS: 10.0000 mg | ORAL_TABLET | ORAL | Status: DC | PRN

## 2024-04-20 MED ORDER — ACETAMINOPHEN 325 MG PO TABS
325.0000 mg | ORAL_TABLET | Freq: Four times a day (QID) | ORAL | Status: DC | PRN
  Administered 2024-04-21 – 2024-04-22 (×2): 650 mg via ORAL
  Filled 2024-04-20 (×2): qty 2

## 2024-04-20 MED ORDER — METHOCARBAMOL 1000 MG/10ML IJ SOLN: 500.0000 mg | Freq: Four times a day (QID) | INTRAMUSCULAR | Status: DC | PRN

## 2024-04-20 MED ORDER — FENTANYL CITRATE (PF) 100 MCG/2ML IJ SOLN
INTRAMUSCULAR | Status: AC
  Filled 2024-04-20: qty 2

## 2024-04-20 MED ORDER — HYDROMORPHONE HCL 1 MG/ML IJ SOLN: 0.5000 mg | INTRAMUSCULAR | Status: DC | PRN

## 2024-04-20 MED ORDER — ALUM & MAG HYDROXIDE-SIMETH 200-200-20 MG/5ML PO SUSP: 30.0000 mL | ORAL | Status: DC | PRN

## 2024-04-20 MED ORDER — OXYCODONE HCL 5 MG/5ML PO SOLN: 5.0000 mg | Freq: Once | ORAL | Status: DC | PRN

## 2024-04-20 MED ORDER — CHLORHEXIDINE GLUCONATE 0.12 % MT SOLN
15.0000 mL | Freq: Once | OROMUCOSAL | Status: AC
  Administered 2024-04-20: 15 mL via OROMUCOSAL

## 2024-04-20 MED ORDER — PHENYLEPHRINE 80 MCG/ML (10ML) SYRINGE FOR IV PUSH (FOR BLOOD PRESSURE SUPPORT)
PREFILLED_SYRINGE | INTRAVENOUS | Status: DC | PRN
  Administered 2024-04-20: 160 ug via INTRAVENOUS

## 2024-04-20 MED ORDER — CEFAZOLIN SODIUM-DEXTROSE 2-4 GM/100ML-% IV SOLN
2.0000 g | INTRAVENOUS | Status: AC
  Administered 2024-04-20: 2 g via INTRAVENOUS
  Filled 2024-04-20: qty 100

## 2024-04-20 MED ORDER — ONDANSETRON HCL 4 MG/2ML IJ SOLN
4.0000 mg | Freq: Four times a day (QID) | INTRAMUSCULAR | Status: DC | PRN
  Administered 2024-04-20: 4 mg via INTRAVENOUS
  Filled 2024-04-20 (×2): qty 2

## 2024-04-20 MED ORDER — SENNA 8.6 MG PO TABS
1.0000 | ORAL_TABLET | Freq: Every day | ORAL | Status: DC
  Administered 2024-04-20 – 2024-04-24 (×5): 8.6 mg via ORAL
  Filled 2024-04-20 (×5): qty 1

## 2024-04-20 MED ORDER — LETROZOLE 2.5 MG PO TABS
2.5000 mg | ORAL_TABLET | Freq: Every day | ORAL | Status: DC
  Administered 2024-04-21 – 2024-04-24 (×4): 2.5 mg via ORAL
  Filled 2024-04-20 (×4): qty 1

## 2024-04-20 MED ORDER — PANTOPRAZOLE SODIUM 40 MG PO TBEC
40.0000 mg | DELAYED_RELEASE_TABLET | Freq: Every day | ORAL | Status: DC
  Administered 2024-04-21 – 2024-04-24 (×4): 40 mg via ORAL
  Filled 2024-04-20 (×4): qty 1

## 2024-04-20 MED ORDER — SODIUM CHLORIDE 0.9 % IR SOLN
Status: DC | PRN
  Administered 2024-04-20: 1000 mL

## 2024-04-20 MED ORDER — FENTANYL CITRATE (PF) 50 MCG/ML IJ SOSY
50.0000 ug | PREFILLED_SYRINGE | INTRAMUSCULAR | Status: AC
  Administered 2024-04-20: 25 ug via INTRAVENOUS
  Filled 2024-04-20: qty 2

## 2024-04-20 MED ORDER — OXYCODONE HCL 5 MG PO TABS: 5.0000 mg | ORAL_TABLET | Freq: Once | ORAL | Status: DC | PRN

## 2024-04-20 MED ORDER — BUPIVACAINE IN DEXTROSE 0.75-8.25 % IT SOLN
INTRATHECAL | Status: DC | PRN
  Administered 2024-04-20: 1.6 mL via INTRATHECAL

## 2024-04-20 MED ORDER — ORAL CARE MOUTH RINSE: 15.0000 mL | Freq: Once | OROMUCOSAL | Status: AC

## 2024-04-20 MED ORDER — VITAMIN D 25 MCG (1000 UNIT) PO TABS
1000.0000 [IU] | ORAL_TABLET | Freq: Every day | ORAL | Status: DC
  Administered 2024-04-20 – 2024-04-24 (×5): 1000 [IU] via ORAL
  Filled 2024-04-20 (×5): qty 1

## 2024-04-20 MED ORDER — ASPIRIN 81 MG PO CHEW
81.0000 mg | CHEWABLE_TABLET | Freq: Two times a day (BID) | ORAL | Status: DC
  Administered 2024-04-20 – 2024-04-24 (×8): 81 mg via ORAL
  Filled 2024-04-20 (×8): qty 1

## 2024-04-20 MED ORDER — HYDROMORPHONE HCL 1 MG/ML IJ SOLN
0.2500 mg | INTRAMUSCULAR | Status: DC | PRN
  Administered 2024-04-20: 0.25 mg via INTRAVENOUS

## 2024-04-20 MED ORDER — ACETAMINOPHEN 10 MG/ML IV SOLN
INTRAVENOUS | Status: AC
  Filled 2024-04-20: qty 100

## 2024-04-20 MED ORDER — OXYCODONE HCL 5 MG PO TABS
5.0000 mg | ORAL_TABLET | ORAL | Status: DC | PRN
  Filled 2024-04-20: qty 1

## 2024-04-20 MED ORDER — MEPERIDINE HCL 25 MG/ML IJ SOLN: 6.2500 mg | INTRAMUSCULAR | Status: DC | PRN

## 2024-04-20 MED ORDER — TRAMADOL HCL 50 MG PO TABS
50.0000 mg | ORAL_TABLET | Freq: Four times a day (QID) | ORAL | Status: DC | PRN
  Administered 2024-04-20: 50 mg via ORAL
  Administered 2024-04-21 (×2): 100 mg via ORAL
  Administered 2024-04-21: 50 mg via ORAL
  Administered 2024-04-22 – 2024-04-23 (×4): 100 mg via ORAL
  Administered 2024-04-23: 50 mg via ORAL
  Administered 2024-04-24 (×2): 100 mg via ORAL
  Filled 2024-04-20: qty 2
  Filled 2024-04-20 (×2): qty 1
  Filled 2024-04-20: qty 2
  Filled 2024-04-20: qty 1
  Filled 2024-04-20 (×5): qty 2
  Filled 2024-04-20: qty 1

## 2024-04-20 MED ORDER — METOCLOPRAMIDE HCL 5 MG PO TABS: 5.0000 mg | ORAL_TABLET | Freq: Three times a day (TID) | ORAL | Status: DC | PRN

## 2024-04-20 MED ORDER — POVIDONE-IODINE 10 % EX SWAB: 2.0000 | Freq: Once | CUTANEOUS | Status: DC

## 2024-04-20 NOTE — Op Note (Signed)
 "    Operative Note  Date of operation: 04/20/2024 Preoperative diagnosis: Left knee primary osteoarthritis Postoperative diagnosis: Same  Procedure: Left cemented total knee arthroplasty  Implants: Biomet/Zimmer persona cemented knee system (PS) Implant Name Type Inv. Item Serial No. Manufacturer Lot No. LRB No. Used Action  CEMENT BONE R 1X40 - [id number] Cement CEMENT BONE R 1X40  ZIMMER RECON(ORTH,TRAU,BIO,SG) [id number] Left 2 Implanted  COMPONET TIB PS KNEE D 0D LT - [id number] Joint COMPONET TIB PS KNEE D 0D LT  ZIMMER RECON(ORTH,TRAU,BIO,SG) [id number] Left 1 Implanted  FEMUR CMTD CCR STD SZ7 L KNEE - [id number] Knees FEMUR CMTD CCR STD SZ7 L KNEE  ZIMMER RECON(ORTH,TRAU,BIO,SG) [id number] Left 1 Implanted  STEM POLY PAT PLY 47M KNEE - [id number] Knees STEM POLY PAT PLY 47M KNEE  ZIMMER RECON(ORTH,TRAU,BIO,SG) [id number] Left 1 Implanted  PERSONA Vivacit-E [id number]    ZIMMER RECON(ORTH,TRAU,BIO,SG) [id number] Left 1 Implanted   Surgeon: Lonni GRADE. Vernetta, MD Assistant: Tory Gaskins, PA-C  Anesthesia: #1 left lower extremity adductor canal block, #2 spinal, #3 local Tourniquet time: Under 1 hour EBL: Less than 50 cc Antibiotics: IV Ancef  Complications: None  Indications: The patient is an 84 year old female with debilitating severe osteoarthritis with deformities of both knees.  Her right knee and left knee both have bone-on-bone wear and significant valgus malalignment of the right knee and significant varus malalignment of the left knee.  At this point she does wish to proceed with a left total knee arthroplasty.  Her knee pain is daily and it is severe with both knees.  It is detrimentally affecting her mobility, her quality of life and her actives a day living.  We agree with proceeding today with a left total knee arthroplasty.  We did discuss in length and detail the risks of acute blood loss anemia, nerve and vessel injury, fracture, infection, DVT, implant failure and wound  healing issues.  She understands that our goals are hopefully decreased pain, improved mobility and improve quality of life.  Procedure description: After informed consent was obtained the appropriate left knee was marked, anesthesia obtained a left lower extremity adductor canal block in the holding room.  The patient was then brought to the operating room and set up on the operative table where spinal anesthesia was obtained.  She was laid in supine position the operating table and a Foley catheter was placed.  A nonsterile tourniquet placed around upper left thigh and her left thigh, knee, leg and ankle were prepped and draped with DuraPrep and sterile drapes including a sterile stockinette.  A timeout was called and she was notified of the correct patient the correct left knee.  An Esmarch was then used to wrap out the leg and the tourniquet was inflated to 300 mm pressure.  With the knee extended we made a direct midline incision over the patella and carried this proximally.  Dissection was carried down to the knee joint and a medial parapatellar arthrotomy was made.  The knee was then flexed and removed the ACL, PCL medial and lateral menisci as well as osteophytes in all 3 compartments.  We then used an extramedullary based cutting guide for making her proximal tibia cut correction of varus and valgus and a 7 degree slope.  Made this cut to take 2 mm off the low side and we had to back down to more millimeters.  We then used a intramedullary base cutting guide to the notch of the femur for making our distal femur cut setting this for left  knee at 5 degrees externally rotated and a 10 mm distal femoral cut.  We brought the knee back down to full extension and with a 12 mm extension block it seem like with full extension.  We went back to the femur and placed a femoral sizing guide based off the epicondylar axis and Whitesides line.  Based off of this we chose a size 7 femur.  We put a 4-in-1 cutting block  for a size 7 femur and made our anterior posterior cuts followed by her chamfer cuts.  We also made a femoral box cut for a PS knee.  We then impacted the tibia and chose a size D tibial tray for coverage over the tibial plateau setting the rotation of the tibial tubercle and the femur.  We chose this for size left knee.  Chose a size D tibial tray.  We then did our drill hole and keel punch over this.  We trialed our size D left tibial tray combined with our size 7 left PS standard femur.  We trialed up to a 16 mm thickness PS polyethylene insert and we are pleased with range of motion and stability that insert.  We then made a patella cut and drilled 3 holes for a size 29 patella button.  Again with all trial instrumentation of the knee we are pleased with range of motion and stability.  We then removed all trial instrumentation from the knee and irrigated knee with normal saline solution using pulsatile lavage.  We then placed Marcaine  with epinephrine  around the arthrotomy.  Next the cement was mixed in with the knee in a flexed position we cemented our Biomet/Zimmer persona tibial tray for a left knee size D followed by cementing our size 7 left PS standard femur.  We removed excess cement debris from the knee and placed our 60 mm thickness PS polyethylene insert and cemented our size 29 patella button.  We then held the knee fully extended and compressed while the cemented hardened.  Once the cement hardened the tourniquet was let down and hemostasis was obtained with electrocautery.  The arthrotomy was then closed with interrupted #1 Vicryl suture followed by 0 Vicryl close deep tissue and 2-0 Vicryl close subcu tissue.  The skin was closed with staples.  Well-padded sterile dressings applied.  The patient was taken recovery room.  Tory Gaskins, PA-C did assist during the entire case and beginning to end and his assistance was crucial and medically necessary for soft tissue management and retraction, helping  guide implant placement and a layered closure of the wound. "

## 2024-04-20 NOTE — Transfer of Care (Signed)
 Immediate Anesthesia Transfer of Care Note  Patient: Lori [name]  Procedure(s) Performed: ARTHROPLASTY, KNEE, TOTAL (Left: Knee)  Patient Location: PACU  Anesthesia Type:Spinal and MAC combined with regional for post-op pain  Level of Consciousness: drowsy and patient cooperative  Airway & Oxygen Therapy: Patient Spontanous Breathing  Post-op Assessment: Report given to RN and Post -op Vital signs reviewed and stable  Post vital signs: Reviewed and stable  Last Vitals:  Vitals Value Taken Time  BP    Temp    Pulse 76 04/20/24 12:58  Resp 17 04/20/24 12:58  SpO2 92 % 04/20/24 12:58  Vitals shown include unfiled device data.  Last Pain:  Vitals:   04/20/24 1005  TempSrc:   PainSc: 0-No pain         Complications: No notable events documented.

## 2024-04-20 NOTE — Anesthesia Procedure Notes (Signed)
 Anesthesia Regional Block: Adductor canal block   Pre-Anesthetic Checklist: , timeout performed,  Correct Patient, Correct Site, Correct Laterality,  Correct Procedure, Correct Position, site marked,  Risks and benefits discussed,  Surgical consent,  Pre-op evaluation,  At surgeon's request and post-op pain management  Laterality: Left  Prep: chloraprep       Needles:  Injection technique: Single-shot  Needle Type: Echogenic Stimulator Needle     Needle Length: 9cm  Needle Gauge: 21     Additional Needles:   Procedures:,,,, ultrasound used (permanent image in chart),,    Narrative:  Start time: 04/20/2024 9:55 AM End time: 04/20/2024 10:00 AM Injection made incrementally with aspirations every 5 mL.  Performed by: Personally  Anesthesiologist: Tilford Franky BIRCH, MD  Additional Notes: Discussed risks and benefits of the nerve block in detail, including but not limited vascular injury, permanent nerve damage and infection.   Patient tolerated the procedure well. Local anesthetic introduced in an incremental fashion under minimal resistance after negative aspirations. No paresthesias were elicited. After completion of the procedure, no acute issues were identified and patient continued to be monitored by RN.

## 2024-04-20 NOTE — Evaluation (Signed)
 Physical Therapy Evaluation Patient Details Name: Lori [name] MRN: [id number] DOB: 1940/04/23 Today's Date: 04/20/2024  History of Present Illness  Pt s/p L TKR and wth hx of htn, CVA, Menieres dz and breast CA  Clinical Impression  Pt s/p L TKR and presents with decreased L LE strength/ROM, anxiety, and post op pain limiting functional mobility.   Pt hopes to progress to dc to son's home for recovery.      If plan is discharge home, recommend the following: A little help with walking and/or transfers;A little help with bathing/dressing/bathroom;Assistance with cooking/housework;Assist for transportation;Help with stairs or ramp for entrance   Can travel by private vehicle        Equipment Recommendations Rolling walker (2 wheels)  Recommendations for Other Services       Functional Status Assessment Patient has had a recent decline in their functional status and demonstrates the ability to make significant improvements in function in a reasonable and predictable amount of time.     Precautions / Restrictions Precautions Precautions: Fall Recall of Precautions/Restrictions: Intact Restrictions Weight Bearing Restrictions Per Provider Order: Yes LLE Weight Bearing Per Provider Order: Weight bearing as tolerated      Mobility  Bed Mobility Overal bed mobility: Needs Assistance Bed Mobility: Supine to Sit, Sit to Supine     Supine to sit: Min assist, Mod assist, +2 for physical assistance, +2 for safety/equipment, Used rails Sit to supine: Min assist, Mod assist, +2 for physical assistance, +2 for safety/equipment, HOB elevated   General bed mobility comments: increased time with cues for sequence and assist to manage L LE and control trunk    Transfers Overall transfer level: Needs assistance Equipment used: Rolling walker (2 wheels) Transfers: Sit to/from Stand Sit to Stand: Min assist, Mod assist, +2 physical assistance, +2 safety/equipment, From elevated  surface           General transfer comment: cues for LE management and use of UEs to self assist    Ambulation/Gait Ambulation/Gait assistance: Min assist, +2 physical assistance, +2 safety/equipment Gait Distance (Feet): 6 Feet Assistive device: Rolling walker (2 wheels) Gait Pattern/deviations: Step-to pattern, Decreased step length - right, Decreased step length - left, Shuffle, Trunk flexed Gait velocity: decr     General Gait Details: Step by step cues for sequence, posture and position from [employer]            Wheelchair Mobility     Tilt Bed    Modified Rankin (Stroke Patients Only)       Balance Overall balance assessment: Needs assistance Sitting-balance support: No upper extremity supported, Feet supported Sitting balance-Leahy Scale: Fair     Standing balance support: Bilateral upper extremity supported Standing balance-Leahy Scale: Poor                               Pertinent Vitals/Pain Pain Assessment Pain Assessment: 0-10 Pain Score: 6  Pain Location: L knee Pain Descriptors / Indicators: Aching, Sore Pain Intervention(s): Limited activity within patient's tolerance, Monitored during session, Premedicated before session, Ice applied    Home Living Family/patient expects to be discharged to:: Private residence Living Arrangements: Alone Available Help at Discharge: Family Type of Home: House Home Access: Stairs to enter Entrance Stairs-Rails: None Entrance Stairs-Number of Steps: 4     Home Equipment: Rollator (4 wheels)      Prior Function Prior Level of Function : Independent/Modified Independent  Mobility Comments: using rollator       Extremity/Trunk Assessment   Upper Extremity Assessment Upper Extremity Assessment: Overall WFL for tasks assessed    Lower Extremity Assessment Lower Extremity Assessment: Generalized weakness;LLE deficits/detail    Cervical / Trunk Assessment Cervical /  Trunk Assessment: Kyphotic  Communication   Communication Communication: No apparent difficulties    Cognition Arousal: Alert Behavior During Therapy: WFL for tasks assessed/performed, Anxious, Impulsive   PT - Cognitive impairments: No apparent impairments                         Following commands: Impaired Following commands impaired: Follows one step commands inconsistently (anxiety)     Cueing Cueing Techniques: Verbal cues     General Comments      Exercises Total Joint Exercises Ankle Circles/Pumps: AROM, Both, 15 reps, Supine   Assessment/Plan    PT Assessment Patient needs continued PT services  PT Problem List Decreased strength;Decreased range of motion;Decreased activity tolerance;Decreased balance;Decreased mobility;Decreased knowledge of use of DME;Pain       PT Treatment Interventions DME instruction;Gait training;Stair training;Functional mobility training;Therapeutic activities;Therapeutic exercise;Patient/family education    PT Goals (Current goals can be found in the Care Plan section)  Acute Rehab PT Goals Patient Stated Goal: Regain IND PT Goal Formulation: With patient Time For Goal Achievement: 04/26/24 Potential to Achieve Goals: Good    Frequency 7X/week     Co-evaluation               AM-PAC PT 6 Clicks Mobility  Outcome Measure Help needed turning from your back to your side while in a flat bed without using bedrails?: A Little Help needed moving from lying on your back to sitting on the side of a flat bed without using bedrails?: A Lot Help needed moving to and from a bed to a chair (including a wheelchair)?: A Lot Help needed standing up from a chair using your arms (e.g., wheelchair or bedside chair)?: A Lot Help needed to walk in hospital room?: A Lot Help needed climbing 3-5 steps with a railing? : Total 6 Click Score: 12    End of Session Equipment Utilized During Treatment: Gait belt;Left knee  immobilizer Activity Tolerance: Patient limited by fatigue;Other (comment) (anxiety/nausea) Patient left: in bed;with call bell/phone within reach;with bed alarm set;with family/visitor present Nurse Communication: Mobility status PT Visit Diagnosis: Difficulty in walking, not elsewhere classified (R26.2);Muscle weakness (generalized) (M62.81);Pain Pain - Right/Left: Left Pain - part of body: Knee    Time: 1750-1819 PT Time Calculation (min) (ACUTE ONLY): 29 min   Charges:   PT Evaluation $PT Eval Low Complexity: 1 Low PT Treatments $Gait Training: 8-22 mins PT General Charges $$ ACUTE PT VISIT: 1 Visit         [facility] PT Acute Rehabilitation Services Office [phone]   Lollie Gunner 04/20/2024, 6:40 PM

## 2024-04-20 NOTE — Anesthesia Postprocedure Evaluation (Signed)
"   Anesthesia Post Note  Patient: Lori [name]  Procedure(s) Performed: ARTHROPLASTY, KNEE, TOTAL (Left: Knee)     Patient location during evaluation: PACU Anesthesia Type: Spinal Level of consciousness: oriented and awake and alert Pain management: pain level controlled Vital Signs Assessment: post-procedure vital signs reviewed and stable Respiratory status: spontaneous breathing, respiratory function stable and patient connected to nasal cannula oxygen Cardiovascular status: blood pressure returned to baseline and stable Postop Assessment: no headache, no backache and no apparent nausea or vomiting Anesthetic complications: no   No notable events documented.  Last Vitals:  Vitals:   04/20/24 1300 04/20/24 1315  BP: 127/72 (!) 143/77  Pulse: 79 73  Resp: 17 14  Temp:    SpO2: 91% 94%    Last Pain:  Vitals:   04/20/24 1323  TempSrc:   PainSc: 5                  Franky JONETTA Bald      "

## 2024-04-20 NOTE — Plan of Care (Signed)
" °  Problem: Education: Goal: Knowledge of the prescribed therapeutic regimen will improve Outcome: Progressing   Problem: Bowel/Gastric: Goal: Gastrointestinal status for postoperative course will improve Outcome: Progressing   Problem: Cardiac: Goal: Ability to maintain an adequate cardiac output Outcome: Progressing   "

## 2024-04-20 NOTE — Interval H&P Note (Signed)
 History and Physical Interval Note: The patient understands that she is here today for a left total knee replacement to treat her significant left knee pain and arthritis.  There has been no acute or interval change in her medical status.  The risks and benefits of surgery have been discussed and informed consent has been obtained.  The left operative knee has been marked.  04/20/2024 10:03 AM  Lori [name]  has presented today for surgery, with the diagnosis of osteoarthritis left knee.  The various methods of treatment have been discussed with the patient and family. After consideration of risks, benefits and other options for treatment, the patient has consented to  Procedures: ARTHROPLASTY, KNEE, TOTAL (Left) as a surgical intervention.  The patient's history has been reviewed, patient examined, no change in status, stable for surgery.  I have reviewed the patient's chart and labs.  Questions were answered to the patient's satisfaction.     [name]

## 2024-04-20 NOTE — Anesthesia Procedure Notes (Signed)
 Spinal  Patient location during procedure: OR Start time: 04/20/2024 11:10 AM End time: 04/20/2024 11:16 AM Reason for block: surgical anesthesia  Staffing Performed: resident/CRNA  Authorized by: Tilford Franky BIRCH, MD   Performed by: Nanci Riis, CRNA  Preanesthetic Checklist Completed: patient identified, IV checked, site marked, risks and benefits discussed, surgical consent, monitors and equipment checked, pre-op evaluation and timeout performed Spinal Block Patient position: sitting Prep: DuraPrep Patient monitoring: heart rate, cardiac monitor, continuous pulse ox and blood pressure Approach: midline Location: L3-4 Injection technique: single-shot Needle Needle type: Sprotte  Needle gauge: 24 G Needle length: 9 cm Assessment Sensory level: T4 Events: CSF return

## 2024-04-21 ENCOUNTER — Other Ambulatory Visit (HOSPITAL_COMMUNITY): Payer: Self-pay

## 2024-04-21 LAB — BASIC METABOLIC PANEL WITH GFR
Anion gap: 8 (ref 5–15)
BUN: 11 mg/dL (ref 8–23)
CO2: 26 mmol/L (ref 22–32)
Calcium: 8.8 mg/dL — ABNORMAL LOW (ref 8.9–10.3)
Chloride: 101 mmol/L (ref 98–111)
Creatinine, Ser: 0.59 mg/dL (ref 0.44–1.00)
GFR, Estimated: >60 mL/min
Glucose, Bld: 186 mg/dL — ABNORMAL HIGH (ref 70–99)
Potassium: 3.6 mmol/L (ref 3.5–5.1)
Sodium: 134 mmol/L — ABNORMAL LOW (ref 135–145)

## 2024-04-21 LAB — CBC
HCT: 31.3 % — ABNORMAL LOW (ref 36.0–46.0)
Hemoglobin: 10.2 g/dL — ABNORMAL LOW (ref 12.0–15.0)
MCH: 28.4 pg (ref 26.0–34.0)
MCHC: 32.6 g/dL (ref 30.0–36.0)
MCV: 87.2 fL (ref 80.0–100.0)
Platelets: 266 10*3/uL (ref 150–400)
RBC: 3.59 MIL/uL — ABNORMAL LOW (ref 3.87–5.11)
RDW: 13 % (ref 11.5–15.5)
WBC: 8.2 10*3/uL (ref 4.0–10.5)
nRBC: 0 % (ref 0.0–0.2)

## 2024-04-21 MED ORDER — ONDANSETRON 4 MG PO TBDP
4.0000 mg | ORAL_TABLET | Freq: Three times a day (TID) | ORAL | 0 refills | Status: AC | PRN
  Filled 2024-04-21: qty 20, 7d supply, fill #0

## 2024-04-21 MED ORDER — KETOROLAC TROMETHAMINE 15 MG/ML IJ SOLN
7.5000 mg | Freq: Four times a day (QID) | INTRAMUSCULAR | Status: AC
  Administered 2024-04-21 – 2024-04-22 (×3): 7.5 mg via INTRAVENOUS
  Filled 2024-04-21 (×3): qty 1

## 2024-04-21 MED ORDER — TIZANIDINE HCL 4 MG PO TABS
2.0000 mg | ORAL_TABLET | Freq: Four times a day (QID) | ORAL | Status: DC | PRN
  Administered 2024-04-21 – 2024-04-23 (×4): 2 mg via ORAL
  Filled 2024-04-21 (×4): qty 1

## 2024-04-21 MED ORDER — TIZANIDINE HCL 2 MG PO TABS
2.0000 mg | ORAL_TABLET | Freq: Four times a day (QID) | ORAL | 0 refills | Status: AC | PRN
  Filled 2024-04-21: qty 30, 8d supply, fill #0

## 2024-04-21 MED ORDER — ASPIRIN 81 MG PO CHEW
81.0000 mg | CHEWABLE_TABLET | Freq: Two times a day (BID) | ORAL | 0 refills | Status: AC
  Filled 2024-04-21: qty 30, 15d supply, fill #0

## 2024-04-21 MED ORDER — TRAMADOL HCL 50 MG PO TABS
50.0000 mg | ORAL_TABLET | Freq: Four times a day (QID) | ORAL | 0 refills | Status: AC | PRN
  Filled 2024-04-21: qty 30, 4d supply, fill #0

## 2024-04-21 NOTE — TOC Transition Note (Signed)
 Transition of Care [facility]) - Discharge Note   Patient Details  Name: Lori [name] MRN: [id number] Date of Birth: [birth year]  [facility] CM/SW Contact:  NORMAN ASPEN, LCSW Phone Number: 04/21/2024, 12:04 PM   Clinical Narrative:     Met with pt who confirms she has needed DME in the home.  HHPT prearranged with [facility].  No further IP CM needs.  Final next level of care: Home w Home Health Services Barriers to Discharge: No Barriers Identified   Patient Goals and CMS Choice Patient states their goals for this hospitalization and ongoing recovery are:: return home          Discharge Placement                       Discharge Plan and Services Additional resources added to the After Visit Summary for                  DME Arranged: N/A DME Agency: NA       HH Arranged: PT HH Agency: [facility]        Social Drivers of Health (SDOH) Interventions SDOH Screenings   Food Insecurity: No Food Insecurity (04/20/2024)  Housing: Low Risk (04/20/2024)  Transportation Needs: No Transportation Needs (04/20/2024)  Utilities: Not At Risk (04/20/2024)  Alcohol Screen: Low Risk (10/19/2023)  Depression (PHQ2-9): Low Risk (02/14/2024)  Recent Concern: Depression (PHQ2-9) - Medium Risk (01/16/2024)  Financial Resource Strain: Low Risk (10/19/2023)  Physical Activity: Inactive (10/19/2023)  Social Connections: Moderately Integrated (04/20/2024)  Stress: No Stress Concern Present (10/19/2023)  Tobacco Use: Low Risk (04/20/2024)  Health Literacy: Adequate Health Literacy (10/19/2023)     Readmission Risk Interventions     No data to display

## 2024-04-21 NOTE — Progress Notes (Signed)
 Discharge meds in a secure bag delivered to inpatient pharmacy by this RN.

## 2024-04-21 NOTE — Progress Notes (Signed)
 Physical Therapy Treatment Patient Details Name: Lori [name] MRN: [id number] DOB: 17-May-1940 Today's Date: 04/21/2024   History of Present Illness Pt s/p L TKR and wth hx of htn, CVA, Menieres dz and breast CA    PT Comments  Pt continues cooperative but anxious and requiring increased time and encouragement for all tasks.  THEREX program initiated and pt assisted OOB to ambulate increased distance into hall. Pt progressing but continues to require significant assist for all tasks.  Family present and pleased with progress.    If plan is discharge home, recommend the following: A little help with walking and/or transfers;A little help with bathing/dressing/bathroom;Assistance with cooking/housework;Assist for transportation;Help with stairs or ramp for entrance   Can travel by private vehicle        Equipment Recommendations  Rolling walker (2 wheels)    Recommendations for Other Services       Precautions / Restrictions Precautions Precautions: Fall Recall of Precautions/Restrictions: Intact Restrictions Weight Bearing Restrictions Per Provider Order: Yes LLE Weight Bearing Per Provider Order: Weight bearing as tolerated     Mobility  Bed Mobility Overal bed mobility: Needs Assistance Bed Mobility: Supine to Sit     Supine to sit: Min assist, Mod assist, +2 for physical assistance, +2 for safety/equipment, Used rails     General bed mobility comments: increased time with cues for sequence and assist to manage L LE and control trunk    Transfers Overall transfer level: Needs assistance Equipment used: Rolling walker (2 wheels) Transfers: Sit to/from Stand Sit to Stand: Min assist, Mod assist, +2 physical assistance, +2 safety/equipment, From elevated surface           General transfer comment: cues for LE management and use of UEs to self assist    Ambulation/Gait Ambulation/Gait assistance: Min assist, +2 physical assistance, +2 safety/equipment Gait  Distance (Feet): 16 Feet Assistive device: Rolling walker (2 wheels) Gait Pattern/deviations: Step-to pattern, Decreased step length - right, Decreased step length - left, Shuffle, Trunk flexed Gait velocity: decr     General Gait Details: Step by step cues for sequence, posture and position from [employer]             Wheelchair Mobility     Tilt Bed    Modified Rankin (Stroke Patients Only)       Balance Overall balance assessment: Needs assistance Sitting-balance support: No upper extremity supported, Feet supported Sitting balance-Leahy Scale: Fair     Standing balance support: Bilateral upper extremity supported Standing balance-Leahy Scale: Poor                              Communication Communication Communication: No apparent difficulties  Cognition Arousal: Alert Behavior During Therapy: WFL for tasks assessed/performed, Anxious, Impulsive   PT - Cognitive impairments: No apparent impairments                         Following commands: Impaired Following commands impaired: Follows one step commands inconsistently (anxiety)    Cueing Cueing Techniques: Verbal cues  Exercises Total Joint Exercises Ankle Circles/Pumps: AROM, Both, 15 reps, Supine Quad Sets: AROM, Both, 5 reps, Supine Heel Slides: AAROM, Left, 10 reps Straight Leg Raises: AAROM, Left, 10 reps, Supine    General Comments        Pertinent Vitals/Pain Pain Assessment Pain Assessment: 0-10 Pain Score: 6  Pain Location: L knee Pain Descriptors / Indicators:  Aching, Sore Pain Intervention(s): Limited activity within patient's tolerance, Monitored during session, Premedicated before session, Ice applied    Home Living                          Prior Function            PT Goals (current goals can now be found in the care plan section) Acute Rehab PT Goals Patient Stated Goal: Regain IND PT Goal Formulation: With patient Time For Goal  Achievement: 04/26/24 Potential to Achieve Goals: Good Progress towards PT goals: Progressing toward goals    Frequency    7X/week      PT Plan      Co-evaluation              AM-PAC PT 6 Clicks Mobility   Outcome Measure  Help needed turning from your back to your side while in a flat bed without using bedrails?: A Little Help needed moving from lying on your back to sitting on the side of a flat bed without using bedrails?: A Lot Help needed moving to and from a bed to a chair (including a wheelchair)?: A Lot Help needed standing up from a chair using your arms (e.g., wheelchair or bedside chair)?: A Lot Help needed to walk in hospital room?: A Lot Help needed climbing 3-5 steps with a railing? : Total 6 Click Score: 12    End of Session Equipment Utilized During Treatment: Gait belt;Left knee immobilizer Activity Tolerance: Patient limited by fatigue;Other (comment);Patient limited by pain Patient left: in chair;with call bell/phone within reach;with chair alarm set;with family/visitor present Nurse Communication: Mobility status PT Visit Diagnosis: Difficulty in walking, not elsewhere classified (R26.2);Muscle weakness (generalized) (M62.81);Pain Pain - Right/Left: Left Pain - part of body: Knee     Time: 8851-8779 PT Time Calculation (min) (ACUTE ONLY): 32 min  Charges:    $Gait Training: 8-22 mins $Therapeutic Exercise: 8-22 mins PT General Charges $$ ACUTE PT VISIT: 1 Visit                     [facility] PT Acute Rehabilitation Services Office [phone]    Keyana Guevara 04/21/2024, 12:55 PM

## 2024-04-21 NOTE — Progress Notes (Signed)
 Physical Therapy Treatment Patient Details Name: Lori [name] MRN: [id number] DOB: Jun 16, 1940 Today's Date: 04/21/2024   History of Present Illness Pt s/p L TKR and wth hx of htn, CVA, Menieres dz and breast CA    PT Comments  Pt continues cooperative but progressing slowly with mobility and continues to require significant assist for all basic mobility tasks.    If plan is discharge home, recommend the following: A little help with walking and/or transfers;A little help with bathing/dressing/bathroom;Assistance with cooking/housework;Assist for transportation;Help with stairs or ramp for entrance   Can travel by private vehicle        Equipment Recommendations  Rolling walker (2 wheels)    Recommendations for Other Services       Precautions / Restrictions Precautions Precautions: Fall Recall of Precautions/Restrictions: Intact Restrictions LLE Weight Bearing Per Provider Order: Weight bearing as tolerated     Mobility  Bed Mobility Overal bed mobility: Needs Assistance Bed Mobility: Sit to Supine     Supine to sit: Min assist, Mod assist, +2 for physical assistance, +2 for safety/equipment, Used rails Sit to supine: Min assist, +2 for physical assistance, +2 for safety/equipment   General bed mobility comments: increased time with cues for sequence and assist to manage L LE and control trunk    Transfers Overall transfer level: Needs assistance Equipment used: Rolling walker (2 wheels) Transfers: Sit to/from Stand Sit to Stand: Min assist, Mod assist, +2 physical assistance, +2 safety/equipment, From elevated surface           General transfer comment: cues for LE management and use of UEs to self assist    Ambulation/Gait Ambulation/Gait assistance: Min assist, +2 physical assistance, +2 safety/equipment Gait Distance (Feet): 16 Feet Assistive device: Rolling walker (2 wheels) Gait Pattern/deviations: Step-to pattern, Decreased step length - right,  Decreased step length - left, Shuffle, Trunk flexed Gait velocity: decr     General Gait Details: Step by step cues for sequence, posture and position from [employer]             Wheelchair Mobility     Tilt Bed    Modified Rankin (Stroke Patients Only)       Balance Overall balance assessment: Needs assistance Sitting-balance support: No upper extremity supported, Feet supported Sitting balance-Leahy Scale: Fair     Standing balance support: Bilateral upper extremity supported Standing balance-Leahy Scale: Poor                              Communication Communication Communication: No apparent difficulties  Cognition Arousal: Alert Behavior During Therapy: WFL for tasks assessed/performed, Anxious, Impulsive   PT - Cognitive impairments: No apparent impairments                         Following commands: Impaired Following commands impaired: Follows one step commands inconsistently (anxiety)    Cueing Cueing Techniques: Verbal cues  Exercises Total Joint Exercises Ankle Circles/Pumps: AROM, Both, 15 reps, Supine Quad Sets: AROM, Both, 5 reps, Supine Heel Slides: AAROM, Left, 10 reps Straight Leg Raises: AAROM, Left, 10 reps, Supine    General Comments        Pertinent Vitals/Pain Pain Assessment Pain Assessment: 0-10 Pain Score: 5  Pain Location: L knee Pain Descriptors / Indicators: Aching, Sore Pain Intervention(s): Limited activity within patient's tolerance, Monitored during session, Premedicated before session, Ice applied    Home Living  Prior Function            PT Goals (current goals can now be found in the care plan section) Acute Rehab PT Goals Patient Stated Goal: Regain IND PT Goal Formulation: With patient Time For Goal Achievement: 04/26/24 Potential to Achieve Goals: Good Progress towards PT goals: Progressing toward goals    Frequency    7X/week      PT  Plan      Co-evaluation              AM-PAC PT 6 Clicks Mobility   Outcome Measure  Help needed turning from your back to your side while in a flat bed without using bedrails?: A Little Help needed moving from lying on your back to sitting on the side of a flat bed without using bedrails?: A Lot Help needed moving to and from a bed to a chair (including a wheelchair)?: A Lot Help needed standing up from a chair using your arms (e.g., wheelchair or bedside chair)?: A Lot Help needed to walk in hospital room?: A Lot Help needed climbing 3-5 steps with a railing? : Total 6 Click Score: 12    End of Session Equipment Utilized During Treatment: Gait belt;Left knee immobilizer Activity Tolerance: Patient limited by fatigue;Other (comment);Patient limited by pain Patient left: in bed;with call bell/phone within reach;with bed alarm set Nurse Communication: Mobility status PT Visit Diagnosis: Difficulty in walking, not elsewhere classified (R26.2);Muscle weakness (generalized) (M62.81);Pain Pain - Right/Left: Left Pain - part of body: Knee     Time: 1521-1550 PT Time Calculation (min) (ACUTE ONLY): 29 min  Charges:    $Gait Training: 8-22 mins $Therapeutic Activity: 8-22 mins PT General Charges $$ ACUTE PT VISIT: 1 Visit                     [facility] PT Acute Rehabilitation Services Office [phone]    Glendell Schlottman 04/21/2024, 4:11 PM

## 2024-04-21 NOTE — Plan of Care (Signed)
" °  Problem: Education: Goal: Knowledge of the prescribed therapeutic regimen will improve Outcome: Progressing   Problem: Activity: Goal: Risk for activity intolerance will decrease Outcome: Progressing   Problem: Coping: Goal: Level of anxiety will decrease Outcome: Progressing   Problem: Pain Managment: Goal: General experience of comfort will improve and/or be controlled Outcome: Progressing   "

## 2024-04-21 NOTE — Discharge Instructions (Signed)

## 2024-04-21 NOTE — Progress Notes (Signed)
 Subjective: 1 Day Post-Op Procedures (LRB): ARTHROPLASTY, KNEE, TOTAL (Left) Patient reports pain as quite significant.    Objective: Vital signs in last 24 hours: Temp:  [97.4 F (36.3 C)-98.7 F (37.1 C)] 98.7 F (37.1 C) (01/31 0531) Pulse Rate:  [54-83] 83 (01/31 0531) Resp:  [9-19] 18 (01/31 0531) BP: (127-177)/(59-94) 144/70 (01/31 0531) SpO2:  [91 %-99 %] 91 % (01/31 0531)  Intake/Output from previous day: 01/30 0701 - 01/31 0700 In: 3776.1 [P.O.:840; I.V.:2636.1; IV Piggyback:300] Out: 5600 [Urine:5600] Intake/Output this shift: No intake/output data recorded.  Recent Labs    04/21/24 0318  HGB 10.2*   Recent Labs    04/21/24 0318  WBC 8.2  RBC 3.59*  HCT 31.3*  PLT 266   Recent Labs    04/21/24 0318  NA 134*  K 3.6  CL 101  CO2 26  BUN 11  CREATININE 0.59  GLUCOSE 186*  CALCIUM  8.8*   No results for input(s): LABPT, INR in the last 72 hours.  Sensation intact distally Intact pulses distally Dorsiflexion/Plantar flexion intact Incision: scant drainage Compartment soft   Assessment/Plan: 1 Day Post-Op Procedures (LRB): ARTHROPLASTY, KNEE, TOTAL (Left) Up with therapy      Lori [name] 04/21/2024, 9:22 AM

## 2024-04-22 NOTE — Progress Notes (Signed)
 Subjective: 2 Days Post-Op Procedures (LRB): ARTHROPLASTY, KNEE, TOTAL (Left) Patient reports pain as improved today  Objective: Vital signs in last 24 hours: Temp:  [97.8 F (36.6 C)-98.5 F (36.9 C)] 97.9 F (36.6 C) (02/01 0529) Pulse Rate:  [72-84] 72 (02/01 0529) Resp:  [16-20] 16 (02/01 0529) BP: (127-171)/(58-77) 141/60 (02/01 0529) SpO2:  [93 %-96 %] 93 % (02/01 0529)  Intake/Output from previous day: 01/31 0701 - 02/01 0700 In: 360 [P.O.:360] Out: 600 [Urine:600] Intake/Output this shift: No intake/output data recorded.  Recent Labs    04/21/24 0318  HGB 10.2*   Recent Labs    04/21/24 0318  WBC 8.2  RBC 3.59*  HCT 31.3*  PLT 266   Recent Labs    04/21/24 0318  NA 134*  K 3.6  CL 101  CO2 26  BUN 11  CREATININE 0.59  GLUCOSE 186*  CALCIUM  8.8*   No results for input(s): LABPT, INR in the last 72 hours.  Sensation intact distally Intact pulses distally Dorsiflexion/Plantar flexion intact Incision: scant drainage Compartment soft   Assessment/Plan: 2 Days Post-Op Procedures (LRB): ARTHROPLASTY, KNEE, TOTAL (Left) Up with therapy,  likely DC 2/2 pending mobility/home health      Quincy Prisco 04/22/2024, 8:32 AM

## 2024-04-22 NOTE — Care Management Obs Status (Signed)
 MEDICARE OBSERVATION STATUS NOTIFICATION   Patient Details  Name: Lori [name] MRN: [id number] Date of Birth: [birth year]   Medicare Observation Status Notification Given:  Yes    Sonda Manuella Quill, RN 04/22/2024, 2:32 PM

## 2024-04-22 NOTE — Progress Notes (Signed)
 Physical Therapy Treatment Patient Details Name: Lori [name] MRN: [id number] DOB: 06-27-1940 Today's Date: 04/22/2024   History of Present Illness Pt s/p L TKR and wth hx of htn, CVA, Menieres dz and breast CA    PT Comments  Pt continues anxious but cooperative. Pt requiring increased time and continues slow improvement with bed mobility and transfers but with marked improvement in quality of and distance ambulated this. Pt family report pt does not have a RW (rollator only), they have acquired a BSC, and hospital bed has not been delivered.   If plan is discharge home, recommend the following: A little help with walking and/or transfers;A little help with bathing/dressing/bathroom;Assistance with cooking/housework;Assist for transportation;Help with stairs or ramp for entrance   Can travel by private vehicle        Equipment Recommendations  Rolling walker (2 wheels)    Recommendations for Other Services       Precautions / Restrictions Precautions Precautions: Fall Recall of Precautions/Restrictions: Intact Restrictions Weight Bearing Restrictions Per Provider Order: No LLE Weight Bearing Per Provider Order: Weight bearing as tolerated     Mobility  Bed Mobility Overal bed mobility: Needs Assistance Bed Mobility: Sit to Supine     Supine to sit: Min assist     General bed mobility comments: increased time with cues for sequence and assist to manage L LE    Transfers Overall transfer level: Needs assistance Equipment used: Rolling walker (2 wheels) Transfers: Sit to/from Stand Sit to Stand: Min assist, +2 physical assistance, +2 safety/equipment, From elevated surface           General transfer comment: cues for LE management and use of UEs to self assist    Ambulation/Gait Ambulation/Gait assistance: Min assist, +2 safety/equipment (chair follow) Gait Distance (Feet): 50 Feet Assistive device: Rolling walker (2 wheels) Gait Pattern/deviations:  Step-to pattern, Decreased step length - right, Decreased step length - left, Shuffle, Trunk flexed Gait velocity: decr     General Gait Details: Initial Step by step cues for sequence, posture and position from [employer]             Wheelchair Mobility     Tilt Bed    Modified Rankin (Stroke Patients Only)       Balance Overall balance assessment: Needs assistance Sitting-balance support: No upper extremity supported, Feet supported Sitting balance-Leahy Scale: Good     Standing balance support: Bilateral upper extremity supported Standing balance-Leahy Scale: Poor                              Communication Communication Communication: No apparent difficulties  Cognition Arousal: Alert Behavior During Therapy: WFL for tasks assessed/performed, Anxious, Impulsive   PT - Cognitive impairments: No apparent impairments                         Following commands: Impaired Following commands impaired: Follows one step commands inconsistently (anxiety)    Cueing Cueing Techniques: Verbal cues  Exercises Total Joint Exercises Ankle Circles/Pumps: AROM, Both, 15 reps, Supine Quad Sets: AROM, Both, Supine, 10 reps Heel Slides: AAROM, Left, 15 reps, Supine Straight Leg Raises: AAROM, Left, Supine, 15 reps    General Comments        Pertinent Vitals/Pain Pain Assessment Pain Assessment: 0-10 Pain Score: 4  Pain Location: L knee Pain Descriptors / Indicators: Aching, Sore Pain Intervention(s): Limited activity within patient's tolerance, Monitored  during session, Premedicated before session, Ice applied    Home Living                          Prior Function            PT Goals (current goals can now be found in the care plan section) Acute Rehab PT Goals Patient Stated Goal: Regain IND PT Goal Formulation: With patient Time For Goal Achievement: 04/26/24 Potential to Achieve Goals: Good Progress towards PT goals:  Progressing toward goals    Frequency    7X/week      PT Plan      Co-evaluation              AM-PAC PT 6 Clicks Mobility   Outcome Measure  Help needed turning from your back to your side while in a flat bed without using bedrails?: A Little Help needed moving from lying on your back to sitting on the side of a flat bed without using bedrails?: A Lot Help needed moving to and from a bed to a chair (including a wheelchair)?: A Lot Help needed standing up from a chair using your arms (e.g., wheelchair or bedside chair)?: A Lot Help needed to walk in hospital room?: A Little Help needed climbing 3-5 steps with a railing? : Total 6 Click Score: 13    End of Session Equipment Utilized During Treatment: Gait belt;Left knee immobilizer Activity Tolerance: Patient tolerated treatment well;Patient limited by fatigue Patient left: in bed;with call bell/phone within reach;with bed alarm set Nurse Communication: Mobility status PT Visit Diagnosis: Difficulty in walking, not elsewhere classified (R26.2);Muscle weakness (generalized) (M62.81);Pain Pain - Right/Left: Left Pain - part of body: Knee     Time: 8475-8447 PT Time Calculation (min) (ACUTE ONLY): 28 min  Charges:    $Gait Training: 8-22 mins $Therapeutic Exercise: 8-22 mins $Therapeutic Activity: 8-22 mins PT General Charges $$ ACUTE PT VISIT: 1 Visit                     [facility] PT Acute Rehabilitation Services Office [phone]    Eutimio Gharibian 04/22/2024, 4:31 PM

## 2024-04-22 NOTE — Progress Notes (Signed)
 Physical Therapy Treatment Patient Details Name: Lori [name] MRN: [id number] DOB: 1941-01-08 Today's Date: 04/22/2024   History of Present Illness Pt s/p L TKR and wth hx of htn, CVA, Menieres dz and breast CA    PT Comments  Pt continues anxious but cooperative.  Pt requiring increased time and continues to struggle with bed mobility and transfers but with marked improvement in quality of and distance ambulated this am.    If plan is discharge home, recommend the following: A little help with walking and/or transfers;A little help with bathing/dressing/bathroom;Assistance with cooking/housework;Assist for transportation;Help with stairs or ramp for entrance   Can travel by private vehicle        Equipment Recommendations  Rolling walker (2 wheels)    Recommendations for Other Services       Precautions / Restrictions Precautions Precautions: Fall Recall of Precautions/Restrictions: Intact Restrictions Weight Bearing Restrictions Per Provider Order: No LLE Weight Bearing Per Provider Order: Weight bearing as tolerated     Mobility  Bed Mobility Overal bed mobility: Needs Assistance Bed Mobility: Supine to Sit     Supine to sit: HOB elevated, Used rails, Min assist, Mod assist     General bed mobility comments: increased time with cues for sequence and assist to manage L LE and control trunk    Transfers Overall transfer level: Needs assistance Equipment used: Rolling walker (2 wheels) Transfers: Sit to/from Stand Sit to Stand: Min assist, Mod assist, +2 physical assistance, +2 safety/equipment, From elevated surface           General transfer comment: cues for LE management and use of UEs to self assist    Ambulation/Gait Ambulation/Gait assistance: Min assist, +2 safety/equipment Gait Distance (Feet): 52 Feet Assistive device: Rolling walker (2 wheels) Gait Pattern/deviations: Step-to pattern, Decreased step length - right, Decreased step length -  left, Shuffle, Trunk flexed Gait velocity: decr     General Gait Details: Initial Step by step cues for sequence, posture and position from [employer]             Wheelchair Mobility     Tilt Bed    Modified Rankin (Stroke Patients Only)       Balance Overall balance assessment: Needs assistance Sitting-balance support: No upper extremity supported, Feet supported Sitting balance-Leahy Scale: Good     Standing balance support: Bilateral upper extremity supported Standing balance-Leahy Scale: Poor                              Communication Communication Communication: No apparent difficulties  Cognition Arousal: Alert Behavior During Therapy: WFL for tasks assessed/performed, Anxious, Impulsive   PT - Cognitive impairments: No apparent impairments                         Following commands: Impaired Following commands impaired: Follows one step commands inconsistently (anxiety)    Cueing Cueing Techniques: Verbal cues  Exercises Total Joint Exercises Ankle Circles/Pumps: AROM, Both, 15 reps, Supine Quad Sets: AROM, Both, Supine, 10 reps Heel Slides: AAROM, Left, 15 reps, Supine Straight Leg Raises: AAROM, Left, Supine, 15 reps    General Comments        Pertinent Vitals/Pain Pain Assessment Pain Assessment: 0-10 Pain Score: 5  Pain Location: L knee Pain Descriptors / Indicators: Aching, Sore Pain Intervention(s): Limited activity within patient's tolerance, Monitored during session, Premedicated before session, Ice applied    Home  Living                          Prior Function            PT Goals (current goals can now be found in the care plan section) Acute Rehab PT Goals Patient Stated Goal: Regain IND PT Goal Formulation: With patient Time For Goal Achievement: 04/26/24 Potential to Achieve Goals: Good Progress towards PT goals: Progressing toward goals    Frequency    7X/week      PT Plan       Co-evaluation              AM-PAC PT 6 Clicks Mobility   Outcome Measure  Help needed turning from your back to your side while in a flat bed without using bedrails?: A Little Help needed moving from lying on your back to sitting on the side of a flat bed without using bedrails?: A Lot Help needed moving to and from a bed to a chair (including a wheelchair)?: A Lot Help needed standing up from a chair using your arms (e.g., wheelchair or bedside chair)?: A Lot Help needed to walk in hospital room?: A Lot Help needed climbing 3-5 steps with a railing? : Total 6 Click Score: 12    End of Session Equipment Utilized During Treatment: Gait belt;Left knee immobilizer Activity Tolerance: Patient tolerated treatment well;Patient limited by fatigue Patient left: in chair;with call bell/phone within reach;with chair alarm set;with family/visitor present Nurse Communication: Mobility status PT Visit Diagnosis: Difficulty in walking, not elsewhere classified (R26.2);Muscle weakness (generalized) (M62.81);Pain Pain - Right/Left: Left Pain - part of body: Knee     Time: 1133-1208 PT Time Calculation (min) (ACUTE ONLY): 35 min  Charges:    $Gait Training: 8-22 mins $Therapeutic Exercise: 8-22 mins PT General Charges $$ ACUTE PT VISIT: 1 Visit                     [facility] PT Acute Rehabilitation Services Office [phone]    Lori [name] 04/22/2024, 4:15 PM

## 2024-04-23 NOTE — Plan of Care (Signed)
" °  Problem: Education: Goal: Knowledge of the prescribed therapeutic regimen will improve Outcome: Progressing   Problem: Skin Integrity: Goal: Demonstrates signs of wound healing without infection Outcome: Progressing   Problem: Activity: Goal: Risk for activity intolerance will decrease Outcome: Progressing   Problem: Coping: Goal: Level of anxiety will decrease Outcome: Progressing   Problem: Pain Managment: Goal: General experience of comfort will improve and/or be controlled Outcome: Progressing   "

## 2024-04-23 NOTE — Plan of Care (Signed)
" °  Problem: Education: Goal: Knowledge of the prescribed therapeutic regimen will improve Outcome: Progressing   Problem: Bowel/Gastric: Goal: Gastrointestinal status for postoperative course will improve Outcome: Progressing   Problem: Cardiac: Goal: Ability to maintain an adequate cardiac output Outcome: Progressing Goal: Will show no evidence of cardiac arrhythmias Outcome: Progressing   Problem: Nutritional: Goal: Will attain and maintain optimal nutritional status Outcome: Progressing   Problem: Neurological: Goal: Will regain or maintain usual level of consciousness Outcome: Progressing   Problem: Clinical Measurements: Goal: Ability to maintain clinical measurements within normal limits Outcome: Progressing Goal: Postoperative complications will be avoided or minimized Outcome: Progressing   Problem: Respiratory: Goal: Will regain and/or maintain adequate ventilation Outcome: Progressing Goal: Respiratory status will improve Outcome: Progressing   Problem: Skin Integrity: Goal: Demonstrates signs of wound healing without infection Outcome: Progressing   Problem: Urinary Elimination: Goal: Will remain free from infection Outcome: Progressing Goal: Ability to achieve and maintain adequate urine output Outcome: Progressing   Problem: Education: Goal: Knowledge of General Education information will improve Description: Including pain rating scale, medication(s)/side effects and non-pharmacologic comfort measures Outcome: Progressing   Problem: Health Behavior/Discharge Planning: Goal: Ability to manage health-related needs will improve Outcome: Progressing   Problem: Clinical Measurements: Goal: Ability to maintain clinical measurements within normal limits will improve Outcome: Progressing Goal: Will remain free from infection Outcome: Progressing Goal: Diagnostic test results will improve Outcome: Progressing Goal: Respiratory complications will  improve Outcome: Progressing Goal: Cardiovascular complication will be avoided Outcome: Progressing   Problem: Activity: Goal: Risk for activity intolerance will decrease Outcome: Progressing   Problem: Nutrition: Goal: Adequate nutrition will be maintained Outcome: Progressing   Problem: Coping: Goal: Level of anxiety will decrease Outcome: Progressing   Problem: Elimination: Goal: Will not experience complications related to bowel motility Outcome: Progressing Goal: Will not experience complications related to urinary retention Outcome: Progressing   Problem: Pain Managment: Goal: General experience of comfort will improve and/or be controlled Outcome: Progressing   Problem: Safety: Goal: Ability to remain free from injury will improve Outcome: Progressing   Problem: Skin Integrity: Goal: Risk for impaired skin integrity will decrease Outcome: Progressing   Problem: Education: Goal: Knowledge of the prescribed therapeutic regimen will improve Outcome: Progressing Goal: Individualized Educational Video(s) Outcome: Progressing   Problem: Activity: Goal: Ability to avoid complications of mobility impairment will improve Outcome: Progressing Goal: Range of joint motion will improve Outcome: Progressing   Problem: Clinical Measurements: Goal: Postoperative complications will be avoided or minimized Outcome: Progressing   Problem: Pain Management: Goal: Pain level will decrease with appropriate interventions Outcome: Progressing   Problem: Skin Integrity: Goal: Will show signs of wound healing Outcome: Progressing   "

## 2024-04-24 NOTE — Plan of Care (Signed)
  Problem: Pain Management: Goal: Pain level will decrease with appropriate interventions Outcome: Progressing   Problem: Clinical Measurements: Goal: Postoperative complications will be avoided or minimized Outcome: Progressing   Problem: Activity: Goal: Range of joint motion will improve Outcome: Progressing   Problem: Education: Goal: Knowledge of the prescribed therapeutic regimen will improve Outcome: Progressing   Problem: Safety: Goal: Ability to remain free from injury will improve Outcome: Progressing

## 2024-04-24 NOTE — Progress Notes (Signed)
 Physical Therapy Treatment Patient Details Name: Lori [name] MRN: [id number] DOB: 03-Mar-1941 Today's Date: 04/24/2024   History of Present Illness Pt s/p L TKR on 04/20/24 and wth hx of htn, CVA, Menieres dz, anxiety, and breast CA    PT Comments  Pt making gradual improvements.  She does need min A at times for transfer but was able to ambulate 62' today with RW and CGA.  She performed steps but needed mod A as she does not have any rails at her son's home -she reports her sons typically help lift her up steps.  Needs cues for relaxation and focus on task at hand frequently.  Would benefit from PT session with family -pt texted so to see when he would arrive.      If plan is discharge home, recommend the following: Assistance with cooking/housework;Assist for transportation;Help with stairs or ramp for entrance;A lot of help with walking and/or transfers;A lot of help with bathing/dressing/bathroom   Can travel by private vehicle        Equipment Recommendations  Rolling walker (2 wheels)    Recommendations for Other Services       Precautions / Restrictions Precautions Precautions: Fall Restrictions LLE Weight Bearing Per Provider Order: Weight bearing as tolerated     Mobility  Bed Mobility Overal bed mobility: Needs Assistance Bed Mobility: Supine to Sit, Rolling Rolling: Contact guard assist   Supine to sit: Min assist, Used rails, HOB elevated     General bed mobility comments: Pt reports pressure in bladder to urinate - due to experience yesterday of incontinence upon standing and then increased anxiety -utilized bed pain prior to transfers.  CGA to roll.  THen had pt sit on EOB after urinating.  Pt utilizing gait belt to assist L LE, needing light min A to complete scooting to EOB    Transfers Overall transfer level: Needs assistance Equipment used: Rolling walker (2 wheels) Transfers: Sit to/from Stand Sit to Stand: Min assist, From elevated surface            General transfer comment: Bed slightly elevated; multiple multimodal cues for hand placement; min A to stand and increased time    Ambulation/Gait Ambulation/Gait assistance: Contact guard assist, Min assist Gait Distance (Feet): 75 Feet   Gait Pattern/deviations: Step-to pattern, Decreased weight shift to right, Shuffle Gait velocity: decr     General Gait Details: Initial posterior lean requiring min A but after 4-5 steps progressed to CGA.  Initial steps slow and difficult to advance L LE but improved after 4-5 steps   Stairs Stairs: Yes Stairs assistance: Mod assist, +2 safety/equipment Stair Management: Step to pattern, Forwards, With cane Number of Stairs: 3 General stair comments: cane and HHA as pt has no rails - requiring support for HHA on L and mod A to help lift; pt reports sons have been lifting her in previously   Wheelchair Mobility     Tilt Bed    Modified Rankin (Stroke Patients Only)       Balance Overall balance assessment: Needs assistance Sitting-balance support: No upper extremity supported, Feet supported Sitting balance-Leahy Scale: Good     Standing balance support: Bilateral upper extremity supported, Reliant on assistive device for balance Standing balance-Leahy Scale: Poor                              Communication    Cognition Arousal: Alert Behavior During Therapy: Anxious   PT -  Cognitive impairments: No apparent impairments                       PT - Cognition Comments: Cues to focus on task at hand and for relaxation        Cueing    Exercises Total Joint Exercises Ankle Circles/Pumps: AROM, Both, Supine, 10 reps Quad Sets: AROM, Both, Supine, 10 reps Heel Slides: AAROM, Left, Supine, 10 reps Hip ABduction/ADduction: AAROM, Left, 10 reps, Supine Long Arc Quad: AAROM, Left, 5 reps, Seated Knee Flexion: AAROM, Left, Seated, 10 reps Goniometric ROM: L knee 8 to 65    General Comments         Pertinent Vitals/Pain Pain Assessment Pain Assessment: 0-10 Faces Pain Scale: Hurts little more Pain Location: L knee Pain Descriptors / Indicators: Aching, Sore Pain Intervention(s): Limited activity within patient's tolerance, Monitored during session, Premedicated before session, Repositioned    Home Living                          Prior Function            PT Goals (current goals can now be found in the care plan section) Progress towards PT goals: Progressing toward goals    Frequency    7X/week      PT Plan      Co-evaluation              AM-PAC PT 6 Clicks Mobility   Outcome Measure  Help needed turning from your back to your side while in a flat bed without using bedrails?: A Little Help needed moving from lying on your back to sitting on the side of a flat bed without using bedrails?: A Lot Help needed moving to and from a bed to a chair (including a wheelchair)?: A Little Help needed standing up from a chair using your arms (e.g., wheelchair or bedside chair)?: A Little Help needed to walk in hospital room?: A Little Help needed climbing 3-5 steps with a railing? : A Lot 6 Click Score: 16    End of Session Equipment Utilized During Treatment: Gait belt Activity Tolerance: Patient tolerated treatment well Patient left: with call bell/phone within reach;with chair alarm set;in chair (blanket roll between knees to prevent IR at hip) Nurse Communication: Mobility status PT Visit Diagnosis: Difficulty in walking, not elsewhere classified (R26.2);Muscle weakness (generalized) (M62.81);Pain Pain - Right/Left: Left Pain - part of body: Knee     Time: 8950-8870 PT Time Calculation (min) (ACUTE ONLY): 40 min  Charges:    $Gait Training: 8-22 mins $Therapeutic Exercise: 8-22 mins $Therapeutic Activity: 8-22 mins PT General Charges $$ ACUTE PT VISIT: 1 Visit                     Benjiman, PT Acute Rehab [facility]  [phone]    Benjiman VEAR Mulberry 04/24/2024, 11:47 AM

## 2024-04-24 NOTE — Progress Notes (Signed)
 Patient ID: Lori [name], female   DOB: 31-Oct-1940, 84 y.o.   MRN: [id number] The patient looks good overall.  She has made progress with PT.  Her vital signs are stable and her left operative knee is stable.  She can be discharged to home today.

## 2024-04-24 NOTE — Progress Notes (Signed)
 Discharge medications retrieved from Main Pharmacy and delivered to patient in a secure bag, Discharge instructions given to patient questions asked and answered

## 2024-04-25 ENCOUNTER — Encounter (HOSPITAL_COMMUNITY): Payer: Self-pay | Admitting: Orthopaedic Surgery

## 2024-04-25 ENCOUNTER — Telehealth: Payer: Self-pay | Admitting: Orthopaedic Surgery

## 2024-04-25 NOTE — Telephone Encounter (Signed)
 Liji from [facility] called wanting verbal orders, frequency 2wk 3. Call back number is [phone].

## 2024-04-25 NOTE — Telephone Encounter (Signed)
 Verbal orders given

## 2024-05-01 ENCOUNTER — Encounter: Admitting: Family Medicine

## 2024-05-03 ENCOUNTER — Encounter: Admitting: Orthopaedic Surgery

## 2024-08-27 ENCOUNTER — Ambulatory Visit: Admitting: Hematology and Oncology

## 2024-10-24 ENCOUNTER — Ambulatory Visit
